# Patient Record
Sex: Male | Born: 1965
Health system: Southern US, Community
[De-identification: ages and names within clinical notes are randomized; demographics above are authoritative.]

## PROBLEM LIST (undated history)

## (undated) DIAGNOSIS — I639 Cerebral infarction, unspecified: Secondary | ICD-10-CM

## (undated) DIAGNOSIS — I509 Heart failure, unspecified: Secondary | ICD-10-CM

## (undated) DIAGNOSIS — T148XXA Other injury of unspecified body region, initial encounter: Secondary | ICD-10-CM

## (undated) DIAGNOSIS — E611 Iron deficiency: Secondary | ICD-10-CM

## (undated) DIAGNOSIS — F419 Anxiety disorder, unspecified: Secondary | ICD-10-CM

## (undated) DIAGNOSIS — G43909 Migraine, unspecified, not intractable, without status migrainosus: Secondary | ICD-10-CM

## (undated) DIAGNOSIS — T8859XA Other complications of anesthesia, initial encounter: Secondary | ICD-10-CM

## (undated) DIAGNOSIS — Z720 Tobacco use: Secondary | ICD-10-CM

## (undated) DIAGNOSIS — F329 Major depressive disorder, single episode, unspecified: Secondary | ICD-10-CM

## (undated) DIAGNOSIS — R7989 Other specified abnormal findings of blood chemistry: Secondary | ICD-10-CM

## (undated) DIAGNOSIS — M797 Fibromyalgia: Secondary | ICD-10-CM

## (undated) DIAGNOSIS — K219 Gastro-esophageal reflux disease without esophagitis: Secondary | ICD-10-CM

## (undated) DIAGNOSIS — E11319 Type 2 diabetes mellitus with unspecified diabetic retinopathy without macular edema: Secondary | ICD-10-CM

## (undated) DIAGNOSIS — Z87442 Personal history of urinary calculi: Secondary | ICD-10-CM

## (undated) DIAGNOSIS — T4145XA Adverse effect of unspecified anesthetic, initial encounter: Secondary | ICD-10-CM

## (undated) DIAGNOSIS — R918 Other nonspecific abnormal finding of lung field: Secondary | ICD-10-CM

## (undated) DIAGNOSIS — E119 Type 2 diabetes mellitus without complications: Secondary | ICD-10-CM

## (undated) DIAGNOSIS — I10 Essential (primary) hypertension: Secondary | ICD-10-CM

## (undated) DIAGNOSIS — G894 Chronic pain syndrome: Secondary | ICD-10-CM

## (undated) DIAGNOSIS — M542 Cervicalgia: Secondary | ICD-10-CM

## (undated) DIAGNOSIS — I214 Non-ST elevation (NSTEMI) myocardial infarction: Secondary | ICD-10-CM

## (undated) DIAGNOSIS — F32A Depression, unspecified: Secondary | ICD-10-CM

## (undated) DIAGNOSIS — Z8489 Family history of other specified conditions: Secondary | ICD-10-CM

## (undated) DIAGNOSIS — G8929 Other chronic pain: Secondary | ICD-10-CM

## (undated) DIAGNOSIS — E7800 Pure hypercholesterolemia, unspecified: Secondary | ICD-10-CM

## (undated) DIAGNOSIS — M1990 Unspecified osteoarthritis, unspecified site: Secondary | ICD-10-CM

## (undated) DIAGNOSIS — I2510 Atherosclerotic heart disease of native coronary artery without angina pectoris: Secondary | ICD-10-CM

## (undated) DIAGNOSIS — R9389 Abnormal findings on diagnostic imaging of other specified body structures: Secondary | ICD-10-CM

## (undated) DIAGNOSIS — E349 Endocrine disorder, unspecified: Secondary | ICD-10-CM

## (undated) DIAGNOSIS — K222 Esophageal obstruction: Secondary | ICD-10-CM

## (undated) HISTORY — DX: Endocrine disorder, unspecified: E34.9

## (undated) HISTORY — DX: Atherosclerotic heart disease of native coronary artery without angina pectoris: I25.10

## (undated) HISTORY — PX: UPPER GASTROINTESTINAL ENDOSCOPY: SHX188

## (undated) HISTORY — DX: Type 2 diabetes mellitus without complications: E11.9

## (undated) HISTORY — DX: Cerebral infarction, unspecified: I63.9

## (undated) HISTORY — PX: CERVICAL FUSION: SHX112

## (undated) HISTORY — PX: COLONOSCOPY: SHX174

## (undated) HISTORY — DX: Pure hypercholesterolemia, unspecified: E78.00

## (undated) HISTORY — DX: Other specified abnormal findings of blood chemistry: R79.89

## (undated) HISTORY — DX: Gastro-esophageal reflux disease without esophagitis: K21.9

## (undated) HISTORY — DX: Migraine, unspecified, not intractable, without status migrainosus: G43.909

## (undated) HISTORY — DX: Iron deficiency: E61.1

## (undated) HISTORY — PX: BREAST LUMPECTOMY: SHX2

## (undated) HISTORY — DX: Type 2 diabetes mellitus with unspecified diabetic retinopathy without macular edema: E11.319

## (undated) HISTORY — PX: BACK SURGERY: SHX140

## (undated) HISTORY — DX: Anxiety disorder, unspecified: F41.9

## (undated) HISTORY — DX: Heart failure, unspecified: I50.9

## (undated) HISTORY — PX: CYSTECTOMY: SUR359

## (undated) HISTORY — PX: FRACTURE SURGERY: SHX138

## (undated) HISTORY — DX: Essential (primary) hypertension: I10

## (undated) HISTORY — DX: Esophageal obstruction: K22.2

---

## 1997-09-27 ENCOUNTER — Other Ambulatory Visit: Admission: RE | Admit: 1997-09-27 | Discharge: 1997-09-27 | Payer: Self-pay | Admitting: *Deleted

## 1999-06-18 ENCOUNTER — Emergency Department (HOSPITAL_COMMUNITY): Admission: EM | Admit: 1999-06-18 | Discharge: 1999-06-18 | Payer: Self-pay | Admitting: *Deleted

## 1999-06-18 ENCOUNTER — Encounter: Payer: Self-pay | Admitting: *Deleted

## 1999-06-20 ENCOUNTER — Emergency Department (HOSPITAL_COMMUNITY): Admission: EM | Admit: 1999-06-20 | Discharge: 1999-06-20 | Payer: Self-pay | Admitting: Emergency Medicine

## 1999-07-13 ENCOUNTER — Inpatient Hospital Stay (HOSPITAL_COMMUNITY): Admission: RE | Admit: 1999-07-13 | Discharge: 1999-07-15 | Payer: Self-pay | Admitting: Neurosurgery

## 1999-07-13 ENCOUNTER — Encounter: Payer: Self-pay | Admitting: Neurosurgery

## 1999-08-06 ENCOUNTER — Encounter: Admission: RE | Admit: 1999-08-06 | Discharge: 1999-08-06 | Payer: Self-pay | Admitting: Neurosurgery

## 1999-08-06 ENCOUNTER — Encounter: Payer: Self-pay | Admitting: Neurosurgery

## 1999-10-25 ENCOUNTER — Encounter: Payer: Self-pay | Admitting: Neurosurgery

## 1999-10-25 ENCOUNTER — Encounter: Admission: RE | Admit: 1999-10-25 | Discharge: 1999-10-25 | Payer: Self-pay | Admitting: Neurosurgery

## 2000-02-27 ENCOUNTER — Encounter: Payer: Self-pay | Admitting: Neurosurgery

## 2000-02-27 ENCOUNTER — Encounter: Admission: RE | Admit: 2000-02-27 | Discharge: 2000-02-27 | Payer: Self-pay | Admitting: Neurosurgery

## 2002-04-10 ENCOUNTER — Emergency Department (HOSPITAL_COMMUNITY): Admission: EM | Admit: 2002-04-10 | Discharge: 2002-04-11 | Payer: Self-pay | Admitting: Emergency Medicine

## 2002-04-22 ENCOUNTER — Encounter: Admission: RE | Admit: 2002-04-22 | Discharge: 2002-05-19 | Payer: Self-pay | Admitting: Family Medicine

## 2002-05-26 ENCOUNTER — Encounter: Admission: RE | Admit: 2002-05-26 | Discharge: 2002-06-23 | Payer: Self-pay | Admitting: Family Medicine

## 2002-06-15 ENCOUNTER — Encounter: Payer: Self-pay | Admitting: Neurosurgery

## 2002-06-15 ENCOUNTER — Ambulatory Visit (HOSPITAL_COMMUNITY): Admission: RE | Admit: 2002-06-15 | Discharge: 2002-06-15 | Payer: Self-pay | Admitting: Neurosurgery

## 2002-07-01 ENCOUNTER — Encounter: Admission: RE | Admit: 2002-07-01 | Discharge: 2002-07-01 | Payer: Self-pay | Admitting: Neurosurgery

## 2002-07-01 ENCOUNTER — Encounter: Payer: Self-pay | Admitting: Neurosurgery

## 2002-07-15 ENCOUNTER — Ambulatory Visit (HOSPITAL_COMMUNITY): Admission: RE | Admit: 2002-07-15 | Discharge: 2002-07-15 | Payer: Self-pay | Admitting: Family Medicine

## 2002-07-15 ENCOUNTER — Encounter: Payer: Self-pay | Admitting: Family Medicine

## 2002-07-26 ENCOUNTER — Encounter: Payer: Self-pay | Admitting: Neurosurgery

## 2002-07-26 ENCOUNTER — Encounter: Admission: RE | Admit: 2002-07-26 | Discharge: 2002-07-26 | Payer: Self-pay | Admitting: Neurosurgery

## 2002-09-14 ENCOUNTER — Ambulatory Visit (HOSPITAL_COMMUNITY): Admission: RE | Admit: 2002-09-14 | Discharge: 2002-09-15 | Payer: Self-pay | Admitting: Neurosurgery

## 2002-09-14 ENCOUNTER — Encounter: Payer: Self-pay | Admitting: Neurosurgery

## 2003-01-21 ENCOUNTER — Ambulatory Visit (HOSPITAL_COMMUNITY): Admission: RE | Admit: 2003-01-21 | Discharge: 2003-01-21 | Payer: Self-pay | Admitting: Gastroenterology

## 2003-01-21 ENCOUNTER — Encounter: Payer: Self-pay | Admitting: Gastroenterology

## 2003-01-26 ENCOUNTER — Encounter: Payer: Self-pay | Admitting: Gastroenterology

## 2003-01-26 ENCOUNTER — Ambulatory Visit (HOSPITAL_COMMUNITY): Admission: RE | Admit: 2003-01-26 | Discharge: 2003-01-26 | Payer: Self-pay | Admitting: Gastroenterology

## 2003-04-05 ENCOUNTER — Encounter: Admission: RE | Admit: 2003-04-05 | Discharge: 2003-04-19 | Payer: Self-pay | Admitting: Family Medicine

## 2004-06-24 HISTORY — PX: ANKLE FUSION: SHX881

## 2004-07-11 ENCOUNTER — Emergency Department (HOSPITAL_COMMUNITY): Admission: EM | Admit: 2004-07-11 | Discharge: 2004-07-12 | Payer: Self-pay | Admitting: Emergency Medicine

## 2004-07-13 ENCOUNTER — Ambulatory Visit (HOSPITAL_COMMUNITY): Admission: RE | Admit: 2004-07-13 | Discharge: 2004-07-13 | Payer: Self-pay | Admitting: Neurosurgery

## 2004-07-31 ENCOUNTER — Encounter: Admission: RE | Admit: 2004-07-31 | Discharge: 2004-07-31 | Payer: Self-pay | Admitting: Neurosurgery

## 2004-08-14 ENCOUNTER — Encounter: Admission: RE | Admit: 2004-08-14 | Discharge: 2004-08-14 | Payer: Self-pay | Admitting: Neurosurgery

## 2005-02-11 ENCOUNTER — Encounter: Admission: RE | Admit: 2005-02-11 | Discharge: 2005-03-04 | Payer: Self-pay | Admitting: Neurology

## 2005-03-11 ENCOUNTER — Emergency Department (HOSPITAL_COMMUNITY): Admission: EM | Admit: 2005-03-11 | Discharge: 2005-03-11 | Payer: Self-pay | Admitting: Emergency Medicine

## 2005-05-07 ENCOUNTER — Encounter: Admission: RE | Admit: 2005-05-07 | Discharge: 2005-05-24 | Payer: Self-pay | Admitting: Orthopedic Surgery

## 2005-06-14 ENCOUNTER — Emergency Department (HOSPITAL_COMMUNITY): Admission: EM | Admit: 2005-06-14 | Discharge: 2005-06-14 | Payer: Self-pay | Admitting: Emergency Medicine

## 2005-10-30 ENCOUNTER — Ambulatory Visit: Payer: Self-pay | Admitting: Cardiology

## 2005-11-01 ENCOUNTER — Ambulatory Visit: Payer: Self-pay

## 2005-11-01 ENCOUNTER — Encounter: Payer: Self-pay | Admitting: Cardiovascular Disease

## 2005-11-08 ENCOUNTER — Ambulatory Visit: Payer: Self-pay | Admitting: Cardiology

## 2005-11-08 ENCOUNTER — Ambulatory Visit: Payer: Self-pay

## 2005-11-08 ENCOUNTER — Encounter: Payer: Self-pay | Admitting: Cardiology

## 2005-11-14 ENCOUNTER — Ambulatory Visit: Payer: Self-pay | Admitting: Cardiology

## 2005-11-21 ENCOUNTER — Ambulatory Visit: Payer: Self-pay | Admitting: Gastroenterology

## 2005-11-27 ENCOUNTER — Ambulatory Visit: Payer: Self-pay | Admitting: Gastroenterology

## 2006-06-12 ENCOUNTER — Emergency Department (HOSPITAL_COMMUNITY): Admission: EM | Admit: 2006-06-12 | Discharge: 2006-06-12 | Payer: Self-pay | Admitting: Emergency Medicine

## 2006-08-01 ENCOUNTER — Encounter: Admission: RE | Admit: 2006-08-01 | Discharge: 2006-08-01 | Payer: Self-pay | Admitting: Neurosurgery

## 2006-08-08 ENCOUNTER — Emergency Department (HOSPITAL_COMMUNITY): Admission: EM | Admit: 2006-08-08 | Discharge: 2006-08-09 | Payer: Self-pay | Admitting: Emergency Medicine

## 2006-08-14 ENCOUNTER — Inpatient Hospital Stay (HOSPITAL_COMMUNITY): Admission: RE | Admit: 2006-08-14 | Discharge: 2006-08-16 | Payer: Self-pay | Admitting: Neurosurgery

## 2006-10-17 ENCOUNTER — Emergency Department (HOSPITAL_COMMUNITY): Admission: EM | Admit: 2006-10-17 | Discharge: 2006-10-18 | Payer: Self-pay | Admitting: *Deleted

## 2007-01-05 ENCOUNTER — Ambulatory Visit: Payer: Self-pay | Admitting: Infectious Disease

## 2007-01-05 DIAGNOSIS — M1990 Unspecified osteoarthritis, unspecified site: Secondary | ICD-10-CM | POA: Insufficient documentation

## 2007-01-05 LAB — CONVERTED CEMR LAB
ALT: 48 units/L (ref 0–53)
AST: 23 units/L (ref 0–37)
Albumin: 5.2 g/dL (ref 3.5–5.2)
Alkaline Phosphatase: 108 units/L (ref 39–117)
BUN: 11 mg/dL (ref 6–23)
Basophils Absolute: 0 10*3/uL (ref 0.0–0.1)
Basophils Relative: 0 % (ref 0–1)
CMV IgM: <0.9 (ref <0.90)
CO2: 27 meq/L (ref 19–32)
Calcium: 10.4 mg/dL (ref 8.4–10.5)
Chloride: 105 meq/L (ref 96–112)
Creatinine, Ser: 1.16 mg/dL (ref 0.40–1.50)
Cytomegalovirus Ab-IgG: UNDETERMINED
EBV NA IgG: 6.87 — ABNORMAL HIGH
EBV VCA IgG: 4.24 — ABNORMAL HIGH
EBV VCA IgM: 0.59
Eosinophils Absolute: 0.2 10*3/uL (ref 0.0–0.7)
Eosinophils Relative: 3 % (ref 0–5)
Glucose, Bld: 95 mg/dL (ref 70–99)
HCT: 46.6 % (ref 39.0–52.0)
HCV Ab: NEGATIVE
Hemoglobin: 15.6 g/dL (ref 13.0–17.0)
Lymphocytes Relative: 28 % (ref 12–46)
Lymphs Abs: 1.4 10*3/uL (ref 0.7–3.3)
MCHC: 33.5 g/dL (ref 30.0–36.0)
MCV: 92.1 fL (ref 78.0–100.0)
Monocytes Absolute: 0.4 10*3/uL (ref 0.2–0.7)
Monocytes Relative: 8 % (ref 3–11)
Neutro Abs: 3 10*3/uL (ref 1.7–7.7)
Neutrophils Relative %: 60 % (ref 43–77)
Platelets: 162 10*3/uL (ref 150–400)
Potassium: 4.7 meq/L (ref 3.5–5.3)
RBC: 5.06 M/uL (ref 4.22–5.81)
RDW: 14.4 % — ABNORMAL HIGH (ref 11.5–14.0)
RPR Ser Ql: REACTIVE — AB
RPR Titer: 1:8 {titer}
Rubeola IgG: 1.11 — ABNORMAL HIGH
Sodium: 143 meq/L (ref 135–145)
Total Bilirubin: 0.7 mg/dL (ref 0.3–1.2)
Total Protein: 7.8 g/dL (ref 6.0–8.3)
WBC: 5 10*3/uL (ref 4.0–10.5)

## 2007-01-12 ENCOUNTER — Telehealth: Payer: Self-pay | Admitting: Infectious Disease

## 2007-01-12 DIAGNOSIS — R768 Other specified abnormal immunological findings in serum: Secondary | ICD-10-CM | POA: Insufficient documentation

## 2007-01-26 ENCOUNTER — Ambulatory Visit: Payer: Self-pay | Admitting: Infectious Disease

## 2007-01-26 LAB — CONVERTED CEMR LAB

## 2007-06-07 ENCOUNTER — Emergency Department (HOSPITAL_COMMUNITY): Admission: EM | Admit: 2007-06-07 | Discharge: 2007-06-07 | Payer: Self-pay | Admitting: Emergency Medicine

## 2007-08-05 ENCOUNTER — Emergency Department (HOSPITAL_COMMUNITY): Admission: EM | Admit: 2007-08-05 | Discharge: 2007-08-05 | Payer: Self-pay | Admitting: Emergency Medicine

## 2007-11-02 ENCOUNTER — Emergency Department (HOSPITAL_COMMUNITY): Admission: EM | Admit: 2007-11-02 | Discharge: 2007-11-02 | Payer: Self-pay | Admitting: Emergency Medicine

## 2008-02-23 ENCOUNTER — Emergency Department (HOSPITAL_COMMUNITY): Admission: EM | Admit: 2008-02-23 | Discharge: 2008-02-23 | Payer: Self-pay | Admitting: Emergency Medicine

## 2008-04-18 ENCOUNTER — Encounter: Admission: RE | Admit: 2008-04-18 | Discharge: 2008-04-18 | Payer: Self-pay | Admitting: Neurosurgery

## 2008-10-24 ENCOUNTER — Encounter: Admission: RE | Admit: 2008-10-24 | Discharge: 2008-10-24 | Payer: Self-pay | Admitting: Neurology

## 2009-04-07 ENCOUNTER — Ambulatory Visit (HOSPITAL_BASED_OUTPATIENT_CLINIC_OR_DEPARTMENT_OTHER): Admission: RE | Admit: 2009-04-07 | Discharge: 2009-04-07 | Payer: Self-pay | Admitting: Family Medicine

## 2009-04-08 ENCOUNTER — Ambulatory Visit: Payer: Self-pay | Admitting: Internal Medicine

## 2009-04-26 ENCOUNTER — Ambulatory Visit (HOSPITAL_COMMUNITY): Admission: RE | Admit: 2009-04-26 | Discharge: 2009-04-27 | Payer: Self-pay | Admitting: Neurosurgery

## 2009-05-10 ENCOUNTER — Observation Stay (HOSPITAL_COMMUNITY): Admission: AD | Admit: 2009-05-10 | Discharge: 2009-05-12 | Payer: Self-pay | Admitting: Neurosurgery

## 2009-08-18 ENCOUNTER — Observation Stay (HOSPITAL_COMMUNITY): Admission: EM | Admit: 2009-08-18 | Discharge: 2009-08-19 | Payer: Self-pay | Admitting: Emergency Medicine

## 2009-08-19 ENCOUNTER — Encounter: Payer: Self-pay | Admitting: Cardiology

## 2009-08-19 ENCOUNTER — Encounter (INDEPENDENT_AMBULATORY_CARE_PROVIDER_SITE_OTHER): Payer: Self-pay | Admitting: Internal Medicine

## 2009-08-24 ENCOUNTER — Ambulatory Visit: Payer: Self-pay | Admitting: Cardiology

## 2009-08-24 DIAGNOSIS — R0602 Shortness of breath: Secondary | ICD-10-CM | POA: Insufficient documentation

## 2009-08-24 DIAGNOSIS — R079 Chest pain, unspecified: Secondary | ICD-10-CM | POA: Insufficient documentation

## 2009-08-25 ENCOUNTER — Ambulatory Visit: Payer: Self-pay | Admitting: Internal Medicine

## 2009-08-30 ENCOUNTER — Encounter: Payer: Self-pay | Admitting: Cardiology

## 2009-09-07 ENCOUNTER — Telehealth: Payer: Self-pay | Admitting: Cardiology

## 2009-09-07 LAB — CONVERTED CEMR LAB
Pro B Natriuretic peptide (BNP): 14 pg/mL (ref 0.0–100.0)
Sed Rate: 11 mm/hr (ref 0–22)

## 2009-09-08 ENCOUNTER — Ambulatory Visit: Payer: Self-pay

## 2009-09-08 ENCOUNTER — Encounter: Payer: Self-pay | Admitting: Cardiology

## 2009-09-08 ENCOUNTER — Ambulatory Visit (HOSPITAL_COMMUNITY): Admission: RE | Admit: 2009-09-08 | Discharge: 2009-09-08 | Payer: Self-pay | Admitting: Cardiology

## 2009-09-08 ENCOUNTER — Ambulatory Visit: Payer: Self-pay | Admitting: Cardiology

## 2009-10-05 ENCOUNTER — Encounter: Admission: RE | Admit: 2009-10-05 | Discharge: 2009-10-05 | Payer: Self-pay | Admitting: Neurosurgery

## 2009-10-25 ENCOUNTER — Ambulatory Visit (HOSPITAL_COMMUNITY): Admission: RE | Admit: 2009-10-25 | Discharge: 2009-10-26 | Payer: Self-pay | Admitting: Neurosurgery

## 2010-04-17 ENCOUNTER — Encounter: Admission: RE | Admit: 2010-04-17 | Discharge: 2010-04-17 | Payer: Self-pay | Admitting: Neurology

## 2010-07-15 ENCOUNTER — Encounter: Payer: Self-pay | Admitting: Neurosurgery

## 2010-07-24 NOTE — Progress Notes (Signed)
Summary: PFT RESULTS   Phone Note Call from Patient Call back at Home Phone [phone]   Caller: Patient Reason for Call: Talk to Nurse, Lab or Test Results Summary of Call: REQUEST RESULTS OF PFT Initial call taken by: Migdalia Dk,  September 07, 2009 11:01 AM  Follow-up for Phone Call        talked with pt-pt will discuss PFT results further with Dr Shirlee Latch at time of appt 09-08-09 -pt states inhaler has helped some

## 2010-07-24 NOTE — Miscellaneous (Signed)
Summary: Orders Update  Clinical Lists Changes  Problems: Added new problem of ABFND, FALSE POSITIVE SEROLOGIC TEST, SYPH (ICD-795.6) Orders: Added new Test order of T- * Misc. Laboratory test [phone]) - Signed

## 2010-07-24 NOTE — Assessment & Plan Note (Signed)
Summary: ec6/pericarditis/eval for stress test & echo/seen in ed over ...  Medications Added DILAUDID 2 MG TABS (HYDROMORPHONE HCL) take 2 to 3 tablets as needed LEXAPRO 20 MG TABS (ESCITALOPRAM OXALATE) take 1 1/2 tablet once daily BACLOFEN 10 MG TABS (BACLOFEN) take one tablet as needed TANDEM 162-115.2 MG CAPS (FERROUS FUM-IRON POLYSACCH) once daily ASPIRIN 81 MG TABS (ASPIRIN) once daily COMBIVENT 18-103 MCG/ACT AERO (IPRATROPIUM-ALBUTEROL) please use 4 times per day      Allergies Added:   Primary Provider:  Rudi Heap  CC:  new patient/evaluation for stress test and echo.  History of Present Illness: 45 yo with history of GERD and smoking presents for evaluation of chest pain and exertional dyspnea.  Patient has had exertional shortness of breath since 10/10.  He is out of breath after climbing a flight of steps or with trying to jog.  Moderate exertion at work tires him easily.  He does continue to smoke about 1 ppd.  No wheezing that he has noted.  For the last month, patient has been getting episodes of chest tightness, moderate in severity.  Nothing in particular brings on the pain though it may come more often with exertion than at rest.  It can last 5 min - 2 hours.  It occurs on and off throughout the day.  It seems to be worse with deep breathing or lying on his right side.  The pain got quite severe last Friday so patient went to the ER.  CXR was clear. He was admitted to rule out MI.  Cardiac enzymes were negative and ECG unremarkable.  He was thought to potentially have acute pericarditis and was treated with Ibuprofen with some but not complete relief.    ECG: NSR, normal  Labs (2/11): TSH normal, LDL 100, HDL 30, creatinine 1.18, cardiac enzymes negative  Current Medications (verified): 1)  Alprazolam 1 Mg  Tabs (Alprazolam) .... Take 1 Tablet By Mouth Four Times A Day As Needed Anxiety 2)  Dilaudid 2 Mg Tabs (Hydromorphone Hcl) .... Take 2 To 3 Tablets As Needed 3)   Protonix 20 Mg  Tbec (Pantoprazole Sodium) .... Take 1 Tablet By Mouth Two Times A Day 4)  Lexapro 20 Mg Tabs (Escitalopram Oxalate) .... Take 1 1/2 Tablet Once Daily 5)  Baclofen 10 Mg Tabs (Baclofen) .... Take One Tablet As Needed 6)  Tandem 162-115.2 Mg Caps (Ferrous Fum-Iron Polysacch) .... Once Daily 7)  Aspirin 81 Mg Tabs (Aspirin) .... Once Daily  Allergies (verified): 1)  ! Sulfa 2)  ! Midodrine Hcl  Past History:  Past Medical History: 1. Headache 2. Osteoarthritis  3. Depression 4. Chronic cervical spine disease, status post multiple surgeries with chronic pain 5.  Insomnia, late onset of sleep, and he sleeps until late in the morning 6. Tobacco abuse: Smokes 1 ppd.  7. GERD 8. ETT-myoview (5/07): No ischemia or infarction  Family History: Family History of Arthritis Family History of Endometrial cancer Grandfather with "leaky heart valve" and CHF Uncle with CABG at 57  Social History: Occupation: Insurance claims handler Married, monogamous, no hx of STDs Current Smoker 21years of 1.5ppd Alcohol use-yes Drug use-no remote marijuana, cocaine Travelled to [place] 7 years ago.  Hunter. Skins animals.  One outside dog.  Review of Systems       All systems reviewed and negative except as per HPI.   Vital Signs:  Patient profile:   45 year old male Height:      69 inches Weight:  188 pounds BMI:     27.86 Pulse rate:   74 / minute Pulse rhythm:   regular BP sitting:   105 / 71  (left arm) Cuff size:   large  Vitals Entered By: Judithe Modest CMA (August 24, 2009 8:49 AM)   Impression & Recommendations:  Problem # 1:  CHEST PAIN-UNSPECIFIED (ICD-786.50) Patient has atypical chest pain that has been present for a month.  There does seem to be a pleuritic and positional component to it, does not seem to be exertional.  ECG is normal (no evidence for pericarditis).  Ibuprofen has helped some.  Acute pericarditis is a possibility.  Would also consider asthma with  chest tightness though the chest pain is not really associated with his exertional shortness of breath.  Doubt coronary ischemia given minimal risk factors (smoking only).   - Echo to assess for pericardial effusion.  - ESR - Continue course of Ibuprofen (also taking Protonix) - ETT to assess for ischemia.   Problem # 2:  DYSPNEA (ICD-786.05) Patient has been short of breath with exertion for several months.  ? cause.  He is a smoker and also works in a Insurance claims handler.  Would consider COPD and asthma as possible causes.  Will be getting echo to assess LV function.  Will check BNP.  Also should get full PFTs.  I will try him on a Combivent inhaler to see if it helps any.   Problem # 3:  SMOKER I strongly encouraged him to stop smoking.  He should use nicotine patches.  Will avoid Chantix given depression history.   Other Orders: TLB-BNP (B-Natriuretic Peptide) (83880-BNPR) TLB-Sedimentation Rate (ESR) (85652-ESR) Pulmonary Function Test (PFT) Echocardiogram (Echo) Treadmill (Treadmill)  Patient Instructions: 1)  Your physician recommends that you schedule a follow-up appointment in: 2 weeks-to have echo AND gxt SAME DAY 2)  Your physician has recommended you make the following change in your medication: PLEASE START USING INHALER 4X PER DAY AND GET NON-SMOKING PATCHES OVER THE COUNTER--START WITH 21 MG PATCHES 3)  Your physician has requested that you have an echocardiogram.  Echocardiography is a painless test that uses sound waves to create images of your heart. It provides your doctor with information about the size and shape of your heart and how well your heart's chambers and valves are working.  This procedure takes approximately one hour. There are no restrictions for this procedure. 4)  Your physician has requested that you have an exercise tolerance test.  For further information please visit [email or web address]  Please also follow instruction sheet, as given. 5)  Your physician has  recommended that you have a pulmonary function test.  Pulmonary Function Tests are a group of tests that measure how well air moves in and out of your lungs. Prescriptions: COMBIVENT 18-103 MCG/ACT AERO (IPRATROPIUM-ALBUTEROL) please use 4 times per day  #1 x 6   Entered by:   Ledon Snare, RN   Authorized by:   Marca Ancona, MD   Signed by:   Ledon Snare, RN on 08/24/2009   Method used:   Electronically to        [facility] [phone]* (retail)       [address]       [place], [place]  [address]       Ph: [phone] or [phone]       Fax: [phone]   RxID:   [id number]   Appended Document:  ec6/pericarditis/eval for stress test & echo/seen in ed over ... normal BNP and ESR

## 2010-07-24 NOTE — Assessment & Plan Note (Signed)
Summary: new + west nile elevated ASO +Rubedo   PCP:  LONG, Scott  Chief Complaint:  New pt referral    pos west nile   nausea, fatique, no appetite, and muscle pain.  History of Present Illness:  45 year old Caucasian male with history of cervical disk disease, s.p multiple surgeries, chronic headaches preents. Towards the end of May noted malaise, lack of energy, decreased appetite, nausea, loose stools with food.  fatigue, weight los occasional dizziness, but no syncope, bu no fevers, missing work presented to Dr. Zenda Alpers for evaluation. Of note his son had malaise subjective fevers and remained at home for 3 days around this same time.  Per the patient Dr. Zenda Alpers prescribed him bactrim ds by mouth one tablet two times a day,  He continued to feel worse, with temparature to 103.7  where he was seen by Dr. Christell Constant and was found to have what sounds like macular rash on left trunk, on bilateral shins. Labs were sent. Redness progressed across, chest, back, face, within 36 hours with fevers, drenching sweats and painful cervical adenopathy..Rash was nonrpuritic but irritating. Septra was stopped. Patient was rx benadry and started on doxycycline to cover for RMSR, ehrlichia. . Rash failed to improve and he stopped the doxcycyline. He was called back on Saturday  on June 14th. Rubeola IgG and IgM were added tand Per report the titers came back "borderline," and he was "quarantined" by Dr. Bevelyn Buckles. I do not yet have these titers, HOwever followup titers on 6/19 were .95 for IgM (borderline), IgG postive at 1.34. Health dept and doubted Measles, and suggested Chad nile serologiest  West Nile IgG but not IgM  was positive initially by EIA but negative on repeat test in July. ASO titers were checked and elevated above 300. Remaining labs are summarized below but include RMSF of 1:64,  Ehrlichia test negative, and repeat Lyme test negative. Remainder of ARboviruses panel were negative. Monospot negative. Patient has been feeling better over the past several weeks. HIs energy is returning, and is working half days. HIs fevers are gone. Still having sweating at night with sheets now damp.    Current Allergies (reviewed today): ! SULFA  Past Medical History:    Headache    Osteoarthritis  Past Surgical History:    Cervical surgery removal of bone spurs    Cervical fusion 5, 6,7     Cervical fusion 2, 3     Teeth extraction in 2007, with implants    Ankle fracture s.p surgery   Family History:    Family History of Arthritis    Family History of Endometrial cancer  Social History:    Occupation: Insurance claims handler    Married, monogamous, no hx of STDs    Current Smoker 21years of 1.5ppd    Alcohol use-yes    Drug use-no    remote marijuana, cocaine    Travelled to [place] 7 years ago.     Has travelled to [facility] last year, [place] 2 years, [place]    Hunter white tail deer. Last hunted in December. Skins animals.     One dog outside dog.   Risk Factors:  Tobacco use:  current    Cigarettes:  Yes -- 1.5 pack(s) per day    Counseled to quit/cut down tobacco use:  yes Drug use:  no Alcohol use:  yes    Type:  beer    Drinks per day:  <1    Has patient --  Felt need to cut down:  no       Been annoyed by complaints:  no       Felt guilty about drinking:  no       Needed eye opener in the morning:  no    Counseled to quit/cut down alcohol use:  no Exercise:  no  Family History Risk Factors:    Family History of MI in females < 57 years old:  no    Family History of MI in males < 73 years old:  no   Review of Systems      See HPI  General      Complains of fatigue and malaise.  Eyes      Denies blurring and eye irritation.  ENT      Denies difficulty swallowing.  CV      Denies chest pain or discomfort and difficulty breathing at night.  Resp      Complains of shortness of breath.      with exertion  GI      Complains of diarrhea.      Denies abdominal pain, bloody stools, and constipation.  GU      Denies dysuria.  MS      Complains of loss of strength and muscle weakness.       Denies joint pain, joint redness, and joint swelling.  Derm      Complains of rash.      see HPI  Neuro      Complains of numbness.      across forehead with headaches (chronic)  Psych      Denies anxiety and depression.  ENT      Denies difficulty swallowing.   Vital Signs:  Patient Profile:   45 Years Old Male Weight:      151.5 pounds Temp:     97.3 degrees F oral Pulse rate:   68 / minute BP sitting:   113 / 72  (right arm)  Pt. in pain?   yes    Location:   neck  Vitals Entered By: Tomasita Morrow RN (January 05, 2007 12:20 PM)              Is Patient Diabetic? No  Does patient need assistance? Functional Status Self care Ambulation Normal   Physical Exam  General:     alert.   Head:     normocephalic and atraumatic.   Eyes:     vision grossly intact, pupils equal, pupils round, and pupils reactive to light.   Ears:     no external deformities.   Nose:     no external deformity and no nasal discharge.   Mouth:     good dentition, no erythema, no exudates, no posterior lymphoid hypertrophy, no postnasal drip, no aphthous ulcers, and fair dentition.   Neck:     supple and no masses.   Chest Wall:     no deformities.   Lungs:     normal respiratory effort, normal breath sounds, no dullness, no fremitus, no crackles, and no wheezes.   Heart:     normal rate, regular rhythm, no murmur, no gallop, and no rub.   Abdomen:     soft, non-tender, normal bowel sounds, no distention, no masses, and no guarding.   Msk:     normal ROM.   Pulses:     R radial normal.   Extremities:     No clubbing, cyanosis, edema, or deformity noted with normal  full range of motion of all joints.   Neurologic:     alert & oriented X3, cranial nerves II-XII intact, strength normal in all extremities, sensation intact to light touch, and gait normal.   Skin:     various small hemangiomas smaller than mm in size on lower abdomen and back, no other obvious rash at present  Cervical Nodes:     No lymphadenopathy noted Axillary Nodes:     No palpable lymphadenopathy Inguinal Nodes:     No significant adenopathy Psych:     Oriented X3 and memory intact for recent and remote.   Additional Exam:     Labs reviewed from [facility] clinic: 12/30/06: wbc, hgb, hct normal, platelets 138, CMET ast 69, alt 149, alk phoph 142, ASO 322.9  12/17/06: EEE, WEE, [place] Encephalitis, [place] Encephalitis, IgG and IgM by IFA negative <1:16 WEst Nile IgG IFA <1:16, West Nile IgM by EIA negaive, [place] Encephalitis by EIA nonspecific result Rickettsia Ricketsii IgG IFA 1: 64, typhi 64, Ehrlichia IgG IFA <1:64  12/11/06: cmet ast 44, alt 88 ggt 151, cbc normal HIV negative West Nile Virus Antibody IgG positive (by EIA?) West Nile virus antibody IgM negative  Rubeola IgM 0.95 (borderline) Rubeola IgG EIA 1.34 ( positive) ASO 353      Impression & Recommendations:  Problem # 1:  FUO (ICD-780.6) Assessment: Deteriorated Of major concern to patient, provider was possibility of atypical measles in an adult. While this patient most likely received liveattenuated vaccine which had less reports of later adult measles, atypical measles than the killed vaccine, development of measles was certainly possible I do not have all of his titers, but a fourfold rise in titers would be diagnostic and I do not see evidence for this. He has no serological evidence for recent WNV, and the one EIA may be a false positive. His high ASO certainly suggests recent strep infection but he has no evidence for Rheumatic fever or Glomerulonephritis. Other possible causes for his symptom constellation in May June would include EBV, (monospot negative) CMV, HIV (but he is negative bye ELISA), syphillis, Parvovirus.  I am encouraged by his improvment and counselled the patient that he should continue to do so.  In interm in interest of helping him know what he may have had I am rechecking rubeola titers, rmsf titers, cbc, cmet I am checking RPR and hep C, B, A serologies  Orders: Consultation Level IV (38756) T-Comprehensive Metabolic Panel ([id number]) T-CBC w/Diff ([id number]) T- * Misc. Laboratory test [phone]) T-Hepatitis C Antibody [phone]) T-RPR (Syphilis) [phone]) T-CMV IgG Antibody [phone]) T-CMV IgM  Antibody ([id number]) T-Epstein Barr Virus Antibody Panel I [phone]) T- * Misc. Laboratory test [id number])   Medications Added to Medication List This Visit: 1)  Alprazolam 1 Mg Tabs (Alprazolam) .... Take 1 tablet by mouth four times a day as needed anxiety 2)  Vicodin 5-500 Mg Tabs (Hydrocodone-acetaminophen) .... Take 1 tablet by mouth four times a day as needed pain 3)  Protonix 20 Mg Tbec (Pantoprazole sodium) .... Take 1 tablet by mouth two times a day 4)  Prozac 20 Mg Caps (Fluoxetine hcl) .... Once daily   Patient Instructions: 1)  Discussed the hazards of tobacco smoking (use). Smoking cessation recommended and techniques and options to help patient quit were discussed. 2)  Please schedule a follow-up appointment in 1 month.   ]  CC: DR. Caren Macadam MD, Lindaann Pascal PA

## 2010-07-24 NOTE — Progress Notes (Signed)
Summary: wanting lab results/cvd/dde  Phone Note Call from Patient Call back at cell [phone]   Caller: Patient Reason for Call: Talk to Doctor, Lab or Test Results Action Taken: Provider Notified Details for Reason: Message left to call him about his lab results from 01/05/07. Details of Action Taken: Digital page. Initial call taken by: Jennet Maduro RN,  January 12, 2007 2:43 PM  Follow-up for Phone Call        I spoke with patients wife about labs and the need to come for repeat lab. At this time there are no changes to report/tkk Follow-up by: Tomasita Morrow RN,  January 15, 2007 2:30 PM

## 2010-08-10 ENCOUNTER — Other Ambulatory Visit: Payer: Self-pay | Admitting: Neurosurgery

## 2010-08-10 DIAGNOSIS — M542 Cervicalgia: Secondary | ICD-10-CM

## 2010-08-13 ENCOUNTER — Ambulatory Visit
Admission: RE | Admit: 2010-08-13 | Discharge: 2010-08-13 | Disposition: A | Discharge status: Home / Self Care | Payer: BC Managed Care – PPO | Source: Ambulatory Visit | Attending: Neurosurgery | Admitting: Neurosurgery

## 2010-08-13 DIAGNOSIS — M542 Cervicalgia: Secondary | ICD-10-CM

## 2010-09-11 LAB — CBC
HCT: 45.1 % (ref 39.0–52.0)
Hemoglobin: 15.8 g/dL (ref 13.0–17.0)
MCHC: 35.1 g/dL (ref 30.0–36.0)
MCV: 92 fL (ref 78.0–100.0)
Platelets: 132 10*3/uL — ABNORMAL LOW (ref 150–400)
RBC: 4.9 MIL/uL (ref 4.22–5.81)
RDW: 13.3 % (ref 11.5–15.5)
WBC: 7.6 10*3/uL (ref 4.0–10.5)

## 2010-09-11 LAB — BASIC METABOLIC PANEL
BUN: 10 mg/dL (ref 6–23)
CO2: 29 mEq/L (ref 19–32)
Calcium: 9.6 mg/dL (ref 8.4–10.5)
Chloride: 103 mEq/L (ref 96–112)
Creatinine, Ser: 1.16 mg/dL (ref 0.4–1.5)
GFR calc Af Amer: >60 mL/min (ref >60)
GFR calc non Af Amer: >60 mL/min (ref >60)
Glucose, Bld: 78 mg/dL (ref 70–99)
Potassium: 4 mEq/L (ref 3.5–5.1)
Sodium: 138 mEq/L (ref 135–145)

## 2010-09-11 LAB — SURGICAL PCR SCREEN
MRSA, PCR: NEGATIVE
Staphylococcus aureus: NEGATIVE

## 2010-09-14 LAB — POCT CARDIAC MARKERS
CKMB, poc: <1 ng/mL — ABNORMAL LOW (ref 1.0–8.0)
Myoglobin, poc: 115 ng/mL (ref 12–200)
Troponin i, poc: <0.05 ng/mL (ref 0.00–0.09)

## 2010-09-14 LAB — DIFFERENTIAL
Basophils Absolute: 0 10*3/uL (ref 0.0–0.1)
Basophils Relative: 0 % (ref 0–1)
Eosinophils Absolute: 0.1 10*3/uL (ref 0.0–0.7)
Eosinophils Relative: 1 % (ref 0–5)
Lymphocytes Relative: 14 % (ref 12–46)
Lymphs Abs: 1.2 10*3/uL (ref 0.7–4.0)
Monocytes Absolute: 0.4 10*3/uL (ref 0.1–1.0)
Monocytes Relative: 5 % (ref 3–12)
Neutro Abs: 6.4 10*3/uL (ref 1.7–7.7)
Neutrophils Relative %: 80 % — ABNORMAL HIGH (ref 43–77)

## 2010-09-14 LAB — BASIC METABOLIC PANEL
BUN: 8 mg/dL (ref 6–23)
CO2: 31 mEq/L (ref 19–32)
Calcium: 9.6 mg/dL (ref 8.4–10.5)
Chloride: 104 mEq/L (ref 96–112)
Creatinine, Ser: 1.18 mg/dL (ref 0.4–1.5)
GFR calc Af Amer: >60 mL/min (ref >60)
GFR calc non Af Amer: >60 mL/min (ref >60)
Glucose, Bld: 98 mg/dL (ref 70–99)
Potassium: 4.2 mEq/L (ref 3.5–5.1)
Sodium: 141 mEq/L (ref 135–145)

## 2010-09-14 LAB — TSH: TSH: 1.074 u[IU]/mL (ref 0.350–4.500)

## 2010-09-14 LAB — CBC
HCT: 44.2 % (ref 39.0–52.0)
Hemoglobin: 15.2 g/dL (ref 13.0–17.0)
MCHC: 34.4 g/dL (ref 30.0–36.0)
MCV: 92.1 fL (ref 78.0–100.0)
Platelets: 145 10*3/uL — ABNORMAL LOW (ref 150–400)
RBC: 4.79 MIL/uL (ref 4.22–5.81)
RDW: 14.4 % (ref 11.5–15.5)
WBC: 8 10*3/uL (ref 4.0–10.5)

## 2010-09-14 LAB — CARDIAC PANEL(CRET KIN+CKTOT+MB+TROPI)
CK, MB: 0.8 ng/mL (ref 0.3–4.0)
CK, MB: 0.9 ng/mL (ref 0.3–4.0)
Relative Index: INVALID (ref 0.0–2.5)
Relative Index: INVALID (ref 0.0–2.5)
Total CK: 72 U/L (ref 7–232)
Total CK: 74 U/L (ref 7–232)
Troponin I: 0.01 ng/mL (ref 0.00–0.06)
Troponin I: 0.02 ng/mL (ref 0.00–0.06)

## 2010-09-14 LAB — CK TOTAL AND CKMB (NOT AT ARMC)
CK, MB: 1 ng/mL (ref 0.3–4.0)
Relative Index: INVALID (ref 0.0–2.5)
Total CK: 74 U/L (ref 7–232)

## 2010-09-14 LAB — TROPONIN I: Troponin I: 0.02 ng/mL (ref 0.00–0.06)

## 2010-09-14 LAB — LIPID PANEL
Cholesterol: 153 mg/dL (ref 0–200)
HDL: 30 mg/dL — ABNORMAL LOW (ref >39)
LDL Cholesterol: 100 mg/dL — ABNORMAL HIGH (ref 0–99)
Total CHOL/HDL Ratio: 5.1 RATIO
Triglycerides: 117 mg/dL (ref <150)
VLDL: 23 mg/dL (ref 0–40)

## 2010-09-14 LAB — D-DIMER, QUANTITATIVE (NOT AT ARMC): D-Dimer, Quant: 0.27 ug/mL-FEU (ref 0.00–0.48)

## 2010-09-14 LAB — HOMOCYSTEINE: Homocysteine: 11.3 umol/L (ref 4.0–15.4)

## 2010-09-14 LAB — HEMOGLOBIN A1C
Hgb A1c MFr Bld: 5.1 % (ref 4.6–6.1)
Mean Plasma Glucose: 100 mg/dL

## 2010-09-26 LAB — BASIC METABOLIC PANEL
BUN: 7 mg/dL (ref 6–23)
CO2: 29 mEq/L (ref 19–32)
Calcium: 9.3 mg/dL (ref 8.4–10.5)
Chloride: 101 mEq/L (ref 96–112)
Creatinine, Ser: 1.14 mg/dL (ref 0.4–1.5)
GFR calc Af Amer: >60 mL/min (ref >60)
GFR calc non Af Amer: >60 mL/min (ref >60)
Glucose, Bld: 86 mg/dL (ref 70–99)
Potassium: 4.1 mEq/L (ref 3.5–5.1)
Sodium: 134 mEq/L — ABNORMAL LOW (ref 135–145)

## 2010-09-26 LAB — SEDIMENTATION RATE: Sed Rate: 19 mm/hr — ABNORMAL HIGH (ref 0–16)

## 2010-09-26 LAB — DIFFERENTIAL
Basophils Absolute: 0 10*3/uL (ref 0.0–0.1)
Basophils Relative: 0 % (ref 0–1)
Eosinophils Absolute: 0.1 10*3/uL (ref 0.0–0.7)
Eosinophils Relative: 2 % (ref 0–5)
Lymphocytes Relative: 24 % (ref 12–46)
Lymphs Abs: 1.6 10*3/uL (ref 0.7–4.0)
Monocytes Absolute: 0.5 10*3/uL (ref 0.1–1.0)
Monocytes Relative: 7 % (ref 3–12)
Neutro Abs: 4.4 10*3/uL (ref 1.7–7.7)
Neutrophils Relative %: 67 % (ref 43–77)

## 2010-09-26 LAB — CBC
HCT: 47.8 % (ref 39.0–52.0)
Hemoglobin: 16.8 g/dL (ref 13.0–17.0)
MCHC: 35 g/dL (ref 30.0–36.0)
MCV: 89.9 fL (ref 78.0–100.0)
Platelets: 196 10*3/uL (ref 150–400)
RBC: 5.32 MIL/uL (ref 4.22–5.81)
RDW: 13.3 % (ref 11.5–15.5)
WBC: 6.6 10*3/uL (ref 4.0–10.5)

## 2010-09-26 LAB — GRAM STAIN

## 2010-09-26 LAB — CULTURE, BLOOD (ROUTINE X 2)
Culture: NO GROWTH
Culture: NO GROWTH

## 2010-09-26 LAB — ANAEROBIC CULTURE

## 2010-09-26 LAB — WOUND CULTURE: Culture: NO GROWTH

## 2010-09-27 LAB — CBC
HCT: 46.7 % (ref 39.0–52.0)
Hemoglobin: 16.1 g/dL (ref 13.0–17.0)
MCHC: 34.5 g/dL (ref 30.0–36.0)
MCV: 91.6 fL (ref 78.0–100.0)
Platelets: 150 10*3/uL (ref 150–400)
RBC: 5.1 MIL/uL (ref 4.22–5.81)
RDW: 13.8 % (ref 11.5–15.5)
WBC: 5.3 10*3/uL (ref 4.0–10.5)

## 2010-11-09 NOTE — H&P (Signed)
NAME:  Richard Franco, Richard Franco NO.:  [id number]   MEDICAL RECORD NO.:  [id number]          PATIENT TYPE:  INP   LOCATION:  3007                         FACILITY:  [facility]   PHYSICIAN:  Hilda Lias, M.D.   DATE OF BIRTH:  1966-03-05   DATE OF ADMISSION:  08/14/2006  DATE OF DISCHARGE:                              HISTORY & PHYSICAL   HISTORY OF PRESENT ILLNESS:  Richard Franco is a gentleman who underwent  anterior decompression at C3-4 and, later on, posterior decompression at  C5-6.  The last surgery was done 4 years ago.  I had been following him  in my office and lately he has been complaining of more pain, mostly  neck pain, that was associated with weakness of the upper extremity.  The patient had been going quite frequently to the emergency room  because of increased pain.  Because of that and because he had failed  conservative treatment, we proceeded with a myelogram which showed that,  indeed, he had quite a bit of spondylosis at the C5-6 and C6-7.  In the  lower back, he has hypertrophy in the L5-S1 to the right.  The patient  wanted to proceed to surgery because he is not any better.   PAST MEDICAL HISTORY:  Anterior diskectomy at C3-4, polyp removal from  the colon in 1999, posterior decompression of C5-6.   ALLERGIES:  THE PATIENT IS ALLERGIC TO MIDRIN.   FAMILY HISTORY:  Unremarkable.   SOCIAL HISTORY:  He does not drink but he smokes.   REVIEW OF SYSTEMS:  Positive for neck pain, upper extremity pain.   PHYSICAL EXAMINATION:  HEENT:  The head, nose, and throat are normal.  NECK:  He has scars, one anterior and one posterior.  He has a decreased  flexibility of the cervical spine with pain associated with it.  LUNGS:  Clear.  HEART:  Heart sounds normal.  EXTREMITIES:  There were normal extremities with normal pulses.  NEURO:  He has weakness of the biceps and left triceps.  Reflexes 1+ ,  __________ and he complains of a tingling sensation in the  hands.  Coordination and gait normal.   Cervical spine x-ray showed that he had a spondylosis at the levels of  C4-5, C5-6, C6-7.  The area where he had surgery at the level of C3-4 is  normal.   IMPRESSION:  Cervical spondylosis C5-C6, C6-C7.   RECOMMENDATIONS:  Richard Franco is being taken for surgery.  The  procedure will be anterior cervical diskectomy of the C5-6 and C6-7  followed by graft and plate.  He knows about the risks of infection, CSF  leak, worsening pain, paralysis, no improvement whatsoever, damage to  the vocal cord, damage to the arteries in the neck.  He is fully aware  that the risks are similar to the one when he had the anterior fusion of  the C3-4.           ______________________________  Hilda Lias, M.D.     EB/MEDQ  D:  08/14/2006  T:  08/15/2006  Job:  [id number]

## 2010-11-09 NOTE — Op Note (Signed)
NAME:  KASHEEM, TONER NO.:  [id number]   MEDICAL RECORD NO.:  [id number]          PATIENT TYPE:  INP   LOCATION:  3007                         FACILITY:  [facility]   PHYSICIAN:  Hilda Lias, M.D.   DATE OF BIRTH:  02-Apr-1966   DATE OF PROCEDURE:  08/14/2006  DATE OF DISCHARGE:                               OPERATIVE REPORT   PREOPERATIVE DIAGNOSIS:  C5-6, C6-7 spondylosis with chronic  radiculopathy status post C3-C4 fusion.   POSTOPERATIVE DIAGNOSES:  C5-6, C6-7 spondylosis with chronic  radiculopathy status post C3-C4 fusion.   PROCEDURE:  Anterior decompression of 5-6, 6-7.  Bilateral  foraminotomies, interbody fusion with allograft, plate from C5 to C6 and  C7, microscope.   SURGEON:  Hilda Lias, M.D.   ASSISTANT:  Stefani Dama, M.D.   CLINICAL HISTORY:  Mr. Skelley a 45 year old gentleman who in the past  underwent decompression of the level 3-4 and posterior of level 5-6.  I  have been following him for many years and he is getting worse.  He is  complaining of neck pain radiation to both upper extremities associated  with weakness.  The patient had been seen multiple occasions in the  emergency room.  Myelogram showed that he has spondylosis at the level  of 5-6 and 6-7 with stenosis.  Surgery was advised and the risks were  explained in the history and physical.   PROCEDURE:  The patient was taken to the OR and after intubation the  neck was prepped with DuraPrep.  Transverse incision was made through  the skin, subcutaneous tissue down to the cervical area.  The needle  showed that indeed we were at the level 5-6.  From then on we brought  the microscope into the area.  Anterior ligament was opened.  At the  level 5-6, we found as well as the level 6-7 found quite a bit of  degenerative disk disease.  At the level of 5-6, we found spondylosis.  Decompression with the 1 and 2-mm Kerrison punch was achieved.  The  right side was worse than  the left one.  Good decompression of the  spinal cord was achieved.  Then at the level C6-7 we found the same  finding in the left worse than the right side.  Decompression of the  foramen was achieved.  The endplates at both levels were drilled and two  pieces of allograft of 4 mm was inserted followed by a plate using six  screws.  Lateral cervical spine showed good position of the graft.  The  patient during the procedure had tendency to ooze a little blood.  Although we achieved a good hemostasis, nevertheless, we left drain in  the precervical area.  From then on the area was irrigated, the wound  was closed with Vicryl and Steri-Strips.           ______________________________  Hilda Lias, M.D.     EB/MEDQ  D:  08/14/2006  T:  08/15/2006  Job:  [id number]

## 2010-11-09 NOTE — Op Note (Signed)
NAME:  Richard Franco, Richard Franco                        ACCOUNT NO.:  [id number]   MEDICAL RECORD NO.:  [id number]                   PATIENT TYPE:  OIB   LOCATION:  3009                                 FACILITY:  [facility]   PHYSICIAN:  Hilda Lias, M.D.                DATE OF BIRTH:  05/15/1966   DATE OF PROCEDURE:  09/14/2002  DATE OF DISCHARGE:                                 OPERATIVE REPORT   PREOPERATIVE DIAGNOSIS:  Chronic left C6 radiculopathy secondary to  spondylosis.   POSTOPERATIVE DIAGNOSIS:  Chronic left C6 radiculopathy secondary to  spondylosis.   PROCEDURE:  Left C5-6 foraminotomy, microscope, C-arm, [employer] system.   SURGEON:  Hilda Lias, M.D.   ASSISTANT:  Hewitt Shorts, M.D.   CLINICAL HISTORY:  The patient is a gentleman complaining of neck pain with  radiation down to the left upper extremity, which has failed with  conservative treatment.  Previously he had a fusion at the level of 3-4.  X-  rays show spondylosis at the level of 5-6 affecting the C6 nerve root.  The  patient wanted to go ahead with surgery because he was not any better.  The  risks were explained in the history and physical.   DESCRIPTION OF PROCEDURE:  The patient was taken to the OR and after  intubation, three pins were applied to the head.  He was positioned in a  seated manner.  The neck was prepped with Betadine.  Using the C-arm we  identified the area between 5-6 on the left side about a quarter of an inch  from the midline.  Then infiltration was made with Xylocaine.  Incision was  carried down and dilator was inserted until we found the area between 5-6.  From then on, we brought the microscope into the area.  This was done using  the C-arm.  With the microscope we visualized the lower lamina of 5 and the  upper of 6 as well as the facet.  We started drilling and we found the C6  nerve root.  The C6 nerve root was found to be flat.  There was quite a bit  of narrowing.  Using  the 1 and 2 mm Kerrison punch as well as a drill, we  did a foraminotomy to decompress the nerve root.  Investigation in the  axilla and __________ was negative for any herniated disk.  Having done  this, the area was irrigated.  Depo-Medrol was left in the epidural space,  and the wound was closed with Vicryl and Steri-Strip.                                               Hilda Lias, M.D.    EB/MEDQ  D:  09/14/2002  T:  09/14/2002  Job:  [id number]  

## 2010-11-09 NOTE — H&P (Signed)
NAME:  Richard Franco, Richard Franco                        ACCOUNT NO.:  [id number]   MEDICAL RECORD NO.:  [id number]                   PATIENT TYPE:  OIB   LOCATION:  3009                                 FACILITY:  [facility]   PHYSICIAN:  Hilda Lias, M.D.                DATE OF BIRTH:  [birth year]   DATE OF ADMISSION:  09/14/2002  DATE OF DISCHARGE:                                HISTORY & PHYSICAL   HISTORY OF PRESENT ILLNESS:  The patient is a gentleman who in the past  underwent anterior C3-4 fusion.  For the past two years, he has been  complaining of neck pain with radiation up to the left shoulder and  posterolaterally to the left arm associated with a tingling sensation.  The  patient had had conservative treatment, including selective nerve root  injection without any improvement.  He returned and tells me that the pain  is getting worse.  He had physical therapy and acupuncture with no relief of  the pain.  He complains of a burning sensation in the deltoids.  The patient  had a myelogram followed by a CT scan which showed that indeed at the level  of C5-6 he has spondylosis with narrowing of the canal effecting the C6  nerve root.  Because of that, he wanted to proceed with surgery.   PAST MEDICAL HISTORY:  Anterior cervical fusion at the level of C3-4.  Polyp removed from the colon in 1999.   ALLERGIES:  The patient is allergic to [facility].   FAMILY HISTORY:  Unremarkable.   SOCIAL HISTORY:  He smokes a packs of cigarettes a day.  He does not drink.   REVIEW OF SYSTEMS:  Positive for neck pain.   PHYSICAL EXAMINATION:  HEENT:  Normal.  NECK:  He has anterior scar from previous surgery.  He is able ________  extension ________  mostly to the left shoulder.  LUNGS:  Clear.  HEART:  Heart sounds normal.  ABDOMEN:  Normal.  EXTREMITIES:  Normal pulses.  NEUROLOGIC:  Mental status normal.  Cranial nerves normal.  He has weakness  of the left biceps and the left wrist extensor with  some sensory changes.  The right arm is completely normal and so are the lower extremities.  Reflexes 1+.  Coordination normal.   LABORATORY DATA:  The myelogram and CT scan post myelogram showed stenosis  at the level of C5-6 effecting the left C6 level.   CLINICAL IMPRESSION:  Chronic left cervical radiculopathy secondary to  spondylosis at the foramen of C5-6.    RECOMMENDATIONS:  The patient is being admitted for surgery.  The procedure  will be a left C5-6 foraminotomy to decompress the sixth nerve root.  The  surgery was explained to him.  We are going to use the C-arm and the Matrix.  The risks of course are no improvement, need for further surgery, infection,  CSF leak, and  paralysis.                                               Hilda Lias, M.D.    EB/MEDQ  D:  09/14/2002  T:  09/14/2002  Job:  [id number]

## 2011-01-22 ENCOUNTER — Other Ambulatory Visit: Payer: Self-pay | Admitting: Neurosurgery

## 2011-01-22 DIAGNOSIS — M542 Cervicalgia: Secondary | ICD-10-CM

## 2011-01-22 DIAGNOSIS — IMO0002 Reserved for concepts with insufficient information to code with codable children: Secondary | ICD-10-CM

## 2011-01-22 DIAGNOSIS — M5020 Other cervical disc displacement, unspecified cervical region: Secondary | ICD-10-CM

## 2011-01-22 DIAGNOSIS — M479 Spondylosis, unspecified: Secondary | ICD-10-CM

## 2011-01-29 ENCOUNTER — Other Ambulatory Visit: Payer: BC Managed Care – PPO

## 2011-02-05 ENCOUNTER — Ambulatory Visit
Admission: RE | Admit: 2011-02-05 | Discharge: 2011-02-05 | Disposition: A | Discharge status: Home / Self Care | Payer: BC Managed Care – PPO | Source: Ambulatory Visit | Attending: Neurosurgery | Admitting: Neurosurgery

## 2011-02-05 DIAGNOSIS — M479 Spondylosis, unspecified: Secondary | ICD-10-CM

## 2011-02-05 DIAGNOSIS — M542 Cervicalgia: Secondary | ICD-10-CM

## 2011-02-05 DIAGNOSIS — IMO0002 Reserved for concepts with insufficient information to code with codable children: Secondary | ICD-10-CM

## 2011-02-05 DIAGNOSIS — M5020 Other cervical disc displacement, unspecified cervical region: Secondary | ICD-10-CM

## 2011-04-01 LAB — DIFFERENTIAL
Basophils Absolute: 0
Basophils Relative: 0
Eosinophils Absolute: 0.1 — ABNORMAL LOW
Eosinophils Relative: 2
Lymphocytes Relative: 24
Lymphs Abs: 1.8
Monocytes Absolute: 0.5
Monocytes Relative: 7
Neutro Abs: 5
Neutrophils Relative %: 67

## 2011-04-01 LAB — CBC
HCT: 45.4
Hemoglobin: 15.9
MCHC: 35
MCV: 91.4
Platelets: 181
RBC: 4.96
RDW: 13.6
WBC: 7.4

## 2011-04-01 LAB — RAPID URINE DRUG SCREEN, HOSP PERFORMED
Amphetamines: NOT DETECTED
Barbiturates: NOT DETECTED
Benzodiazepines: POSITIVE — AB
Cocaine: NOT DETECTED
Opiates: NOT DETECTED
Tetrahydrocannabinol: NOT DETECTED

## 2011-04-01 LAB — BASIC METABOLIC PANEL
BUN: 7
CO2: 32
Calcium: 9.8
Chloride: 101
Creatinine, Ser: 1.12
GFR calc Af Amer: >60
GFR calc non Af Amer: >60
Glucose, Bld: 41 — ABNORMAL LOW
Potassium: 4.2
Sodium: 139

## 2011-04-01 LAB — ETHANOL: Alcohol, Ethyl (B): <5

## 2011-05-01 ENCOUNTER — Encounter (HOSPITAL_COMMUNITY): Payer: Self-pay | Admitting: Pharmacy Technician

## 2011-05-02 ENCOUNTER — Encounter (HOSPITAL_COMMUNITY): Payer: Self-pay

## 2011-05-02 ENCOUNTER — Encounter (HOSPITAL_COMMUNITY)
Admission: RE | Admit: 2011-05-02 | Discharge: 2011-05-02 | Disposition: A | Discharge status: Home / Self Care | Payer: BC Managed Care – PPO | Source: Ambulatory Visit | Attending: Neurosurgery | Admitting: Neurosurgery

## 2011-05-02 HISTORY — DX: Depression, unspecified: F32.A

## 2011-05-02 HISTORY — DX: Major depressive disorder, single episode, unspecified: F32.9

## 2011-05-02 HISTORY — DX: Unspecified osteoarthritis, unspecified site: M19.90

## 2011-05-02 HISTORY — DX: Fibromyalgia: M79.7

## 2011-05-02 LAB — BASIC METABOLIC PANEL
BUN: 12 mg/dL (ref 6–23)
CO2: 31 mEq/L (ref 19–32)
Calcium: 10.3 mg/dL (ref 8.4–10.5)
Chloride: 101 mEq/L (ref 96–112)
Creatinine, Ser: 1.22 mg/dL (ref 0.50–1.35)
GFR calc Af Amer: 82 mL/min — ABNORMAL LOW (ref >90)
GFR calc non Af Amer: 71 mL/min — ABNORMAL LOW (ref >90)
Glucose, Bld: 72 mg/dL (ref 70–99)
Potassium: 4.5 mEq/L (ref 3.5–5.1)
Sodium: 140 mEq/L (ref 135–145)

## 2011-05-02 LAB — CBC
HCT: 43.4 % (ref 39.0–52.0)
Hemoglobin: 15 g/dL (ref 13.0–17.0)
MCH: 31.1 pg (ref 26.0–34.0)
MCHC: 34.6 g/dL (ref 30.0–36.0)
MCV: 90 fL (ref 78.0–100.0)
Platelets: 170 10*3/uL (ref 150–400)
RBC: 4.82 MIL/uL (ref 4.22–5.81)
RDW: 12.9 % (ref 11.5–15.5)
WBC: 6.9 10*3/uL (ref 4.0–10.5)

## 2011-05-02 LAB — SURGICAL PCR SCREEN
MRSA, PCR: POSITIVE — AB
Staphylococcus aureus: POSITIVE — AB

## 2011-05-02 MED ORDER — CEFAZOLIN SODIUM 1-5 GM-% IV SOLN: 1.0000 g | INTRAVENOUS | Status: DC

## 2011-05-02 NOTE — Progress Notes (Signed)
Pt has echo and cards workup from 2011 from [facility] in system.

## 2011-05-02 NOTE — Pre-Procedure Instructions (Addendum)
20 BYRNE CAPEK  05/02/2011   Your procedure is scheduled on: 05/09/2011  Report to [facility] at 0530 AM.  Call this number if you have problems the morning of surgery: [phone]   Remember:   Do not eat food:After Midnight.  Do not drink clear liquids: 4 Hours before arrival.  Take these medicines the morning of surgery with A SIP OF WATER: alprazolam,dilaudid.protonix,inderal   Do not wear jewelry, make-up or nail polish.  Do not wear lotions, powders, or perfumes. You may wear deodorant.  Do not shave 48 hours prior to surgery.  Do not bring valuables to the hospital.  Contacts, dentures or bridgework may not be worn into surgery.  Leave suitcase in the car. After surgery it may be brought to your room.  For patients admitted to the hospital, checkout time is 11:00 AM the day of discharge.   Patients discharged the day of surgery will not be allowed to drive home.  Name and phone number of your driver: family  Special Instructions: CHG Shower Use Special Wash: 1/2 bottle night before surgery and 1/2 bottle morning of surgery.   Please read over the following fact sheets that you were given: pain.chg showers,mrsa information,coughing and deep breathing,surgical site infections.

## 2011-05-08 MED ORDER — CEFAZOLIN SODIUM 1-5 GM-% IV SOLN
1.0000 g | INTRAVENOUS | Status: DC
  Filled 2011-05-08: qty 50

## 2011-05-08 MED ORDER — CEFAZOLIN SODIUM-DEXTROSE 2-3 GM-% IV SOLR
2.0000 g | INTRAVENOUS | Status: AC
  Administered 2011-05-09: 2 g via INTRAVENOUS
  Filled 2011-05-08: qty 50

## 2011-05-09 ENCOUNTER — Ambulatory Visit (HOSPITAL_COMMUNITY): Payer: BC Managed Care – PPO

## 2011-05-09 ENCOUNTER — Encounter (HOSPITAL_COMMUNITY): Payer: Self-pay | Admitting: *Deleted

## 2011-05-09 ENCOUNTER — Encounter (HOSPITAL_COMMUNITY)
Admission: RE | Disposition: A | Discharge status: Home / Self Care | Payer: Self-pay | Source: Ambulatory Visit | Attending: Neurosurgery

## 2011-05-09 ENCOUNTER — Ambulatory Visit (HOSPITAL_COMMUNITY)
Admission: RE | Admit: 2011-05-09 | Discharge: 2011-05-10 | Disposition: A | Discharge status: Home / Self Care | Payer: BC Managed Care – PPO | Source: Ambulatory Visit | Attending: Neurosurgery | Admitting: Neurosurgery

## 2011-05-09 ENCOUNTER — Ambulatory Visit (HOSPITAL_COMMUNITY): Payer: BC Managed Care – PPO | Admitting: *Deleted

## 2011-05-09 DIAGNOSIS — Y838 Other surgical procedures as the cause of abnormal reaction of the patient, or of later complication, without mention of misadventure at the time of the procedure: Secondary | ICD-10-CM | POA: Insufficient documentation

## 2011-05-09 DIAGNOSIS — F17200 Nicotine dependence, unspecified, uncomplicated: Secondary | ICD-10-CM | POA: Insufficient documentation

## 2011-05-09 DIAGNOSIS — Z01812 Encounter for preprocedural laboratory examination: Secondary | ICD-10-CM | POA: Insufficient documentation

## 2011-05-09 DIAGNOSIS — K219 Gastro-esophageal reflux disease without esophagitis: Secondary | ICD-10-CM | POA: Insufficient documentation

## 2011-05-09 DIAGNOSIS — T84498A Other mechanical complication of other internal orthopedic devices, implants and grafts, initial encounter: Secondary | ICD-10-CM | POA: Insufficient documentation

## 2011-05-09 HISTORY — PX: POSTERIOR CERVICAL FUSION/FORAMINOTOMY: SHX5038

## 2011-05-09 SURGERY — POSTERIOR CERVICAL FUSION/FORAMINOTOMY LEVEL 1
Anesthesia: General

## 2011-05-09 MED ORDER — HYDROMORPHONE HCL PF 1 MG/ML IJ SOLN
1.0000 mg | INTRAMUSCULAR | Status: DC | PRN
  Administered 2011-05-09: 1 mg via INTRAMUSCULAR

## 2011-05-09 MED ORDER — MENTHOL 3 MG MT LOZG: 1.0000 | LOZENGE | OROMUCOSAL | Status: DC | PRN

## 2011-05-09 MED ORDER — DOCUSATE SODIUM 100 MG PO CAPS
100.0000 mg | ORAL_CAPSULE | Freq: Two times a day (BID) | ORAL | Status: DC
  Administered 2011-05-09 – 2011-05-10 (×3): 100 mg via ORAL
  Filled 2011-05-09 (×3): qty 1

## 2011-05-09 MED ORDER — SILODOSIN 8 MG PO CAPS
8.0000 mg | ORAL_CAPSULE | ORAL | Status: DC
  Filled 2011-05-09 (×2): qty 1

## 2011-05-09 MED ORDER — PHENYLEPHRINE HCL 10 MG/ML IJ SOLN
INTRAMUSCULAR | Status: DC | PRN
  Administered 2011-05-09: 120 ug via INTRAVENOUS
  Administered 2011-05-09 (×2): 80 ug via INTRAVENOUS

## 2011-05-09 MED ORDER — SODIUM CHLORIDE 0.9 % IJ SOLN: 3.0000 mL | INTRAMUSCULAR | Status: DC | PRN

## 2011-05-09 MED ORDER — HYDROMORPHONE HCL 2 MG PO TABS
2.0000 mg | ORAL_TABLET | ORAL | Status: DC | PRN
  Administered 2011-05-09 – 2011-05-10 (×6): 4 mg via ORAL
  Filled 2011-05-09 (×6): qty 2

## 2011-05-09 MED ORDER — ALPRAZOLAM 0.5 MG PO TABS
1.0000 mg | ORAL_TABLET | Freq: Three times a day (TID) | ORAL | Status: DC
  Administered 2011-05-09 – 2011-05-10 (×2): 1 mg via ORAL
  Filled 2011-05-09: qty 1
  Filled 2011-05-09 (×2): qty 2
  Filled 2011-05-09: qty 1

## 2011-05-09 MED ORDER — VANCOMYCIN HCL IN DEXTROSE 1-5 GM/200ML-% IV SOLN
1000.0000 mg | Freq: Once | INTRAVENOUS | Status: AC
  Administered 2011-05-09: 1000 mg via INTRAVENOUS
  Filled 2011-05-09: qty 200

## 2011-05-09 MED ORDER — KETOROLAC TROMETHAMINE 30 MG/ML IJ SOLN
30.0000 mg | Freq: Four times a day (QID) | INTRAMUSCULAR | Status: DC
  Administered 2011-05-09 – 2011-05-10 (×3): 30 mg via INTRAVENOUS
  Filled 2011-05-09 (×6): qty 1

## 2011-05-09 MED ORDER — ACETAMINOPHEN 10 MG/ML IV SOLN
1000.0000 mg | Freq: Four times a day (QID) | INTRAVENOUS | Status: DC
  Administered 2011-05-09: 1000 mg via INTRAVENOUS
  Filled 2011-05-09 (×3): qty 100

## 2011-05-09 MED ORDER — VECURONIUM BROMIDE 10 MG IV SOLR
INTRAVENOUS | Status: DC | PRN
  Administered 2011-05-09: 5 mg via INTRAVENOUS

## 2011-05-09 MED ORDER — ACETAMINOPHEN 650 MG RE SUPP: 650.0000 mg | RECTAL | Status: DC | PRN

## 2011-05-09 MED ORDER — HYDROXYZINE HCL 25 MG PO TABS: 50.0000 mg | ORAL_TABLET | ORAL | Status: DC | PRN

## 2011-05-09 MED ORDER — SODIUM CHLORIDE 0.9 % IR SOLN
Status: DC | PRN
  Administered 2011-05-09: 08:00:00

## 2011-05-09 MED ORDER — MIDAZOLAM HCL 5 MG/5ML IJ SOLN
INTRAMUSCULAR | Status: DC | PRN
  Administered 2011-05-09: 2 mg via INTRAVENOUS

## 2011-05-09 MED ORDER — MORPHINE SULFATE 4 MG/ML IJ SOLN
4.0000 mg | INTRAMUSCULAR | Status: DC | PRN
  Administered 2011-05-09: 8 mg via INTRAMUSCULAR
  Administered 2011-05-09 – 2011-05-10 (×4): 4 mg via INTRAMUSCULAR
  Filled 2011-05-09 (×4): qty 1
  Filled 2011-05-09: qty 2

## 2011-05-09 MED ORDER — ONDANSETRON HCL 4 MG/2ML IJ SOLN
INTRAMUSCULAR | Status: DC | PRN
  Administered 2011-05-09: 4 mg via INTRAVENOUS

## 2011-05-09 MED ORDER — DEXTROSE-NACL 5-0.45 % IV SOLN
INTRAVENOUS | Status: DC
  Administered 2011-05-09: 15:00:00 via INTRAVENOUS

## 2011-05-09 MED ORDER — PANTOPRAZOLE SODIUM 40 MG PO TBEC
40.0000 mg | DELAYED_RELEASE_TABLET | Freq: Two times a day (BID) | ORAL | Status: DC
Start: 2011-05-09 — End: 2011-05-10
  Administered 2011-05-09 – 2011-05-10 (×2): 40 mg via ORAL
  Filled 2011-05-09 (×3): qty 1

## 2011-05-09 MED ORDER — GLYCOPYRROLATE 0.2 MG/ML IJ SOLN
INTRAMUSCULAR | Status: DC | PRN
  Administered 2011-05-09: .5 mg via INTRAVENOUS

## 2011-05-09 MED ORDER — LIDOCAINE-EPINEPHRINE 1 %-1:100000 IJ SOLN
INTRAMUSCULAR | Status: DC | PRN
  Administered 2011-05-09: 30 mL

## 2011-05-09 MED ORDER — LIDOCAINE-PRILOCAINE 2.5-2.5 % EX CREA: 1.0000 "application " | TOPICAL_CREAM | Freq: Once | CUTANEOUS | Status: DC

## 2011-05-09 MED ORDER — ALUM & MAG HYDROXIDE-SIMETH 400-400-40 MG/5ML PO SUSP
30.0000 mL | Freq: Four times a day (QID) | ORAL | Status: DC | PRN
  Administered 2011-05-10 (×2): 30 mL via ORAL
  Filled 2011-05-09: qty 30

## 2011-05-09 MED ORDER — KETOROLAC TROMETHAMINE 30 MG/ML IJ SOLN
30.0000 mg | Freq: Four times a day (QID) | INTRAMUSCULAR | Status: DC
  Administered 2011-05-09: 30 mg via INTRAVENOUS
  Filled 2011-05-09: qty 1

## 2011-05-09 MED ORDER — ONDANSETRON HCL 4 MG/2ML IJ SOLN: 4.0000 mg | Freq: Once | INTRAMUSCULAR | Status: DC | PRN

## 2011-05-09 MED ORDER — ZOLPIDEM TARTRATE 5 MG PO TABS: 10.0000 mg | ORAL_TABLET | Freq: Every evening | ORAL | Status: DC | PRN

## 2011-05-09 MED ORDER — BUPIVACAINE HCL (PF) 0.5 % IJ SOLN
INTRAMUSCULAR | Status: DC | PRN
  Administered 2011-05-09: 30 mL

## 2011-05-09 MED ORDER — TIZANIDINE HCL 4 MG PO TABS
4.0000 mg | ORAL_TABLET | Freq: Three times a day (TID) | ORAL | Status: DC | PRN
  Administered 2011-05-09: 4 mg via ORAL
  Filled 2011-05-09: qty 1

## 2011-05-09 MED ORDER — SODIUM CHLORIDE 0.9 % IR SOLN
Status: DC | PRN
  Administered 2011-05-09: 1000 mL

## 2011-05-09 MED ORDER — HEMOSTATIC AGENTS (NO CHARGE) OPTIME
TOPICAL | Status: DC | PRN
  Administered 2011-05-09: 1 via TOPICAL

## 2011-05-09 MED ORDER — LACTATED RINGERS IV SOLN
INTRAVENOUS | Status: DC | PRN
  Administered 2011-05-09: 07:00:00 via INTRAVENOUS

## 2011-05-09 MED ORDER — CYCLOBENZAPRINE HCL 10 MG PO TABS
10.0000 mg | ORAL_TABLET | Freq: Three times a day (TID) | ORAL | Status: DC | PRN
  Administered 2011-05-09 – 2011-05-10 (×2): 10 mg via ORAL
  Filled 2011-05-09: qty 1

## 2011-05-09 MED ORDER — LAMOTRIGINE 150 MG PO TABS
150.0000 mg | ORAL_TABLET | Freq: Every day | ORAL | Status: DC
  Administered 2011-05-09: 150 mg via ORAL
  Filled 2011-05-09 (×2): qty 1

## 2011-05-09 MED ORDER — ACETAMINOPHEN 325 MG PO TABS: 650.0000 mg | ORAL_TABLET | ORAL | Status: DC | PRN

## 2011-05-09 MED ORDER — SODIUM CHLORIDE 0.9 % IJ SOLN
3.0000 mL | Freq: Two times a day (BID) | INTRAMUSCULAR | Status: DC
  Administered 2011-05-09 – 2011-05-10 (×2): 3 mL via INTRAVENOUS

## 2011-05-09 MED ORDER — HYDROMORPHONE HCL PF 1 MG/ML IJ SOLN
0.2500 mg | INTRAMUSCULAR | Status: DC | PRN
  Administered 2011-05-09 (×4): 0.5 mg via INTRAVENOUS

## 2011-05-09 MED ORDER — PHENOL 1.4 % MT LIQD: 1.0000 | OROMUCOSAL | Status: DC | PRN

## 2011-05-09 MED ORDER — PROPRANOLOL HCL 80 MG PO TABS
80.0000 mg | ORAL_TABLET | Freq: Two times a day (BID) | ORAL | Status: DC
Start: 2011-05-09 — End: 2011-05-10
  Administered 2011-05-09 – 2011-05-10 (×2): 80 mg via ORAL
  Filled 2011-05-09 (×4): qty 1

## 2011-05-09 MED ORDER — HYDROXYZINE HCL 50 MG/ML IM SOLN: 50.0000 mg | INTRAMUSCULAR | Status: DC | PRN

## 2011-05-09 MED ORDER — THROMBIN 20000 UNITS EX KIT
PACK | CUTANEOUS | Status: DC | PRN
  Administered 2011-05-09: 20000 [IU] via TOPICAL

## 2011-05-09 MED ORDER — TRIMETHOPRIM 100 MG PO TABS
100.0000 mg | ORAL_TABLET | Freq: Two times a day (BID) | ORAL | Status: DC
  Administered 2011-05-09 – 2011-05-10 (×2): 100 mg via ORAL
  Filled 2011-05-09 (×4): qty 1

## 2011-05-09 MED ORDER — NEOSTIGMINE METHYLSULFATE 1 MG/ML IJ SOLN
INTRAMUSCULAR | Status: DC | PRN
  Administered 2011-05-09: 3 mg via INTRAVENOUS

## 2011-05-09 MED ORDER — SODIUM CHLORIDE 0.9 % IV SOLN: 250.0000 mL | INTRAVENOUS | Status: DC

## 2011-05-09 MED ORDER — MEPERIDINE HCL 25 MG/ML IJ SOLN
6.2500 mg | INTRAMUSCULAR | Status: DC | PRN
Start: 2011-05-09 — End: 2011-05-09

## 2011-05-09 MED ORDER — FLUOXETINE HCL 20 MG PO CAPS
40.0000 mg | ORAL_CAPSULE | Freq: Every day | ORAL | Status: DC
  Administered 2011-05-10: 40 mg via ORAL
  Filled 2011-05-09 (×2): qty 2

## 2011-05-09 MED ORDER — KETOROLAC TROMETHAMINE 30 MG/ML IJ SOLN
30.0000 mg | Freq: Once | INTRAMUSCULAR | Status: AC
  Administered 2011-05-09: 30 mg via INTRAVENOUS

## 2011-05-09 MED ORDER — LACTATED RINGERS IV SOLN
INTRAVENOUS | Status: DC | PRN
  Administered 2011-05-09 (×3): via INTRAVENOUS

## 2011-05-09 MED ORDER — EPHEDRINE SULFATE 50 MG/ML IJ SOLN
INTRAMUSCULAR | Status: DC | PRN
  Administered 2011-05-09 (×2): 10 mg via INTRAVENOUS

## 2011-05-09 MED ORDER — PROPOFOL 10 MG/ML IV EMUL
INTRAVENOUS | Status: DC | PRN
  Administered 2011-05-09: 150 mg via INTRAVENOUS
  Administered 2011-05-09: 20 mg via INTRAVENOUS
  Administered 2011-05-09: 50 mg via INTRAVENOUS
  Administered 2011-05-09: 30 mg via INTRAVENOUS

## 2011-05-09 MED ORDER — ROCURONIUM BROMIDE 100 MG/10ML IV SOLN
INTRAVENOUS | Status: DC | PRN
  Administered 2011-05-09: 50 mg via INTRAVENOUS

## 2011-05-09 MED ORDER — FENTANYL CITRATE 0.05 MG/ML IJ SOLN
INTRAMUSCULAR | Status: DC | PRN
  Administered 2011-05-09 (×10): 50 ug via INTRAVENOUS

## 2011-05-09 SURGICAL SUPPLY — 71 items
ADH SKN CLS APL DERMABOND .7 (GAUZE/BANDAGES/DRESSINGS)
APL SKNCLS STERI-STRIP NONHPOA (GAUZE/BANDAGES/DRESSINGS) ×2
BAG DECANTER FOR FLEXI CONT (MISCELLANEOUS) ×2 IMPLANT
BANDAGE GAUZE 4  KLING STR (GAUZE/BANDAGES/DRESSINGS) IMPLANT
BENZOIN TINCTURE PRP APPL 2/3 (GAUZE/BANDAGES/DRESSINGS) ×4 IMPLANT
BIT DRILL NEURO 2X3.1 SFT TUCH (MISCELLANEOUS) ×1 IMPLANT
BLADE SURG 11 STRL SS (BLADE) ×2 IMPLANT
BLADE SURG ROTATE 9660 (MISCELLANEOUS) ×2 IMPLANT
BRUSH SCRUB EZ 1% IODOPHOR (MISCELLANEOUS) ×1 IMPLANT
BUR ROUTER 1.1 (BURR) ×1 IMPLANT
CANISTER SUCTION 2500CC (MISCELLANEOUS) ×2 IMPLANT
CLOTH BEACON ORANGE TIMEOUT ST (SAFETY) ×2 IMPLANT
CONT SPEC 4OZ CLIKSEAL STRL BL (MISCELLANEOUS) ×2 IMPLANT
COVER MAYO STAND STRL (DRAPES) IMPLANT
COVER TABLE BACK 60X90 (DRAPES) ×1 IMPLANT
DECANTER SPIKE VIAL GLASS SM (MISCELLANEOUS) ×3 IMPLANT
DERMABOND ADVANCED (GAUZE/BANDAGES/DRESSINGS)
DERMABOND ADVANCED .7 DNX12 (GAUZE/BANDAGES/DRESSINGS) IMPLANT
DRAPE C-ARM 42X72 X-RAY (DRAPES) ×4 IMPLANT
DRAPE LAPAROTOMY 100X72 PEDS (DRAPES) ×2 IMPLANT
DRAPE POUCH INSTRU U-SHP 10X18 (DRAPES) ×2 IMPLANT
DRAPE PROXIMA HALF (DRAPES) IMPLANT
DRILL NEURO 2X3.1 SOFT TOUCH (MISCELLANEOUS) ×2
DRSG EMULSION OIL 3X3 NADH (GAUZE/BANDAGES/DRESSINGS) ×1 IMPLANT
ELECT REM PT RETURN 9FT ADLT (ELECTROSURGICAL) ×2
ELECTRODE REM PT RTRN 9FT ADLT (ELECTROSURGICAL) ×1 IMPLANT
EVACUATOR 1/8 PVC DRAIN (DRAIN) IMPLANT
GAUZE SPONGE 4X4 16PLY XRAY LF (GAUZE/BANDAGES/DRESSINGS) IMPLANT
GLOVE BIOGEL PI IND STRL 8 (GLOVE) ×1 IMPLANT
GLOVE BIOGEL PI INDICATOR 8 (GLOVE) ×1
GLOVE ECLIPSE 7.5 STRL STRAW (GLOVE) ×2 IMPLANT
GLOVE EXAM NITRILE LRG STRL (GLOVE) IMPLANT
GLOVE EXAM NITRILE MD LF STRL (GLOVE) IMPLANT
GLOVE EXAM NITRILE XL STR (GLOVE) IMPLANT
GLOVE EXAM NITRILE XS STR PU (GLOVE) IMPLANT
GOWN BRE IMP SLV AUR LG STRL (GOWN DISPOSABLE) IMPLANT
GOWN BRE IMP SLV AUR XL STRL (GOWN DISPOSABLE) ×2 IMPLANT
GOWN STRL REIN 2XL LVL4 (GOWN DISPOSABLE) IMPLANT
HEMOSTAT SURGICEL 2X14 (HEMOSTASIS) IMPLANT
KIT BASIN OR (CUSTOM PROCEDURE TRAY) ×2 IMPLANT
KIT INFUSE XX SMALL 0.7CC (Orthopedic Implant) ×1 IMPLANT
KIT ROOM TURNOVER OR (KITS) ×2 IMPLANT
NDL HYPO 18GX1.5 BLUNT FILL (NEEDLE) IMPLANT
NDL SPNL 18GX3.5 QUINCKE PK (NEEDLE) ×1 IMPLANT
NDL SPNL 22GX3.5 QUINCKE BK (NEEDLE) ×1 IMPLANT
NEEDLE HYPO 18GX1.5 BLUNT FILL (NEEDLE) IMPLANT
NEEDLE SPNL 18GX3.5 QUINCKE PK (NEEDLE) ×2 IMPLANT
NEEDLE SPNL 22GX3.5 QUINCKE BK (NEEDLE) ×2 IMPLANT
NS IRRIG 1000ML POUR BTL (IV SOLUTION) ×2 IMPLANT
PACK LAMINECTOMY NEURO (CUSTOM PROCEDURE TRAY) ×2 IMPLANT
PACK VITOSS BIOACTIVE 5CC (Neuro Prosthesis/Implant) ×1 IMPLANT
PAD ARMBOARD 7.5X6 YLW CONV (MISCELLANEOUS) ×5 IMPLANT
PIN MAYFIELD SKULL DISP (PIN) ×2 IMPLANT
ROD 120MM 3.5MM (Rod) ×1 IMPLANT
SCREW MULTI AXIAL 3.5X14MM L (Screw) ×2 IMPLANT
SET SCRE TULIP SCW LAMINAR HK (Orthopedic Implant) ×4 IMPLANT
SPONGE GAUZE 4X4 12PLY (GAUZE/BANDAGES/DRESSINGS) ×1 IMPLANT
SPONGE LAP 4X18 X RAY DECT (DISPOSABLE) IMPLANT
SPONGE SURGIFOAM ABS GEL 100 (HEMOSTASIS) ×2 IMPLANT
STAPLER SKIN PROX WIDE 3.9 (STAPLE) ×2 IMPLANT
SUT ETHILON 3 0 FSL (SUTURE) ×1 IMPLANT
SUT VIC AB 0 CT1 18XCR BRD8 (SUTURE) ×1 IMPLANT
SUT VIC AB 0 CT1 8-18 (SUTURE) ×4
SUT VIC AB 2-0 CP2 18 (SUTURE) ×3 IMPLANT
SYR 20ML ECCENTRIC (SYRINGE) ×2 IMPLANT
TAPE CLOTH SURG 4X10 WHT LF (GAUZE/BANDAGES/DRESSINGS) ×1 IMPLANT
TOWEL OR 17X24 6PK STRL BLUE (TOWEL DISPOSABLE) ×2 IMPLANT
TOWEL OR 17X26 10 PK STRL BLUE (TOWEL DISPOSABLE) ×2 IMPLANT
TRAY FOLEY CATH 14FRSI W/METER (CATHETERS) ×1 IMPLANT
UNDERPAD 30X30 INCONTINENT (UNDERPADS AND DIAPERS) ×2 IMPLANT
WATER STERILE IRR 1000ML POUR (IV SOLUTION) ×2 IMPLANT

## 2011-05-09 NOTE — Progress Notes (Signed)
Pt. With MRSA, isolation maintained,

## 2011-05-09 NOTE — H&P (Signed)
Subjective: Patient is a 45 y.o. male who is admitted for treatment of nonunion and pseudoarthrosis at the C4-5 level. He has had multiple anterior and posterior cervical spine surgeries. He has solid fusions at C3-4 C5-6 and C6-7. He did require a C5-6 and C6-7 posterior cervical supplemental arthrodesis. He now has a pseudoarthrosis and nonunion and a C4-5 ACDF. He is admitted for a C4-5 posterior cervical arthrodesis with lateral mass screws rods and bone graft.   Patient Active Problem List  Diagnoses Date Noted  . CONSTRICTIVE PERICARDITIS 08/24/2009  . DYSPNEA 08/24/2009  . CHEST PAIN-UNSPECIFIED 08/24/2009  . ABFND, FALSE POSITIVE SEROLOGIC TEST, [facility] 01/12/2007  . OSTEOARTHRITIS 01/05/2007  . FUO 01/05/2007  . HEADACHE 01/05/2007   Past Medical History  Diagnosis Date  . Headache pt takes inderal for headaches  . Arthritis   . Fibromyalgia   . Depression     Past Surgical History  Procedure Date  . Fracture surgery left ankle    Prescriptions prior to admission  Medication Sig Dispense Refill  . ALPRAZolam (XANAX) 1 MG tablet Take 1 mg by mouth 3 (three) times daily.        Marland Kitchen FLUoxetine (PROZAC) 40 MG capsule Take 40 mg by mouth daily.        Marland Kitchen HYDROmorphone (DILAUDID) 2 MG tablet Take 2 mg by mouth 3 (three) times daily as needed. For neck pain       . lamoTRIgine (LAMICTAL) 100 MG tablet Take 150 mg by mouth at bedtime.        . pantoprazole (PROTONIX) 40 MG tablet Take 40 mg by mouth 2 (two) times daily.        . propranolol (INDERAL) 40 MG tablet Take 80 mg by mouth 2 (two) times daily.        . silodosin (RAPAFLO) 8 MG CAPS capsule Take 8 mg by mouth 1 day or 1 dose.        Marland Kitchen tiZANidine (ZANAFLEX) 4 MG tablet Take 4 mg by mouth 3 (three) times daily as needed. For muscle spasms       . trimethoprim (TRIMPEX) 100 MG tablet Take 100 mg by mouth 2 (two) times daily.         Allergies  Allergen Reactions  . Midodrine Hcl   . Sulfonamide Derivatives     REACTION: ?  allergy with rash to sulfa    History  Substance Use Topics  . Smoking status: Current Everyday Smoker -- 1.0 packs/day  . Smokeless tobacco: Current User  . Alcohol Use: No    History reviewed. No pertinent family history.   Review of Systems A comprehensive review of systems was negative.  Objective: Vital signs in last 24 hours: Temp:  [97.8 F (36.6 C)] 97.8 F (36.6 C) (11/15 0628) Pulse Rate:  [65] 65  (11/15 0628) Resp:  [18] 18  (11/15 0628) BP: (97)/(61) 97/61 mmHg (11/15 0628) SpO2:  [97 %] 97 % (11/15 0628)  EXAM: Patient is a well-developed well-nourished male in no acute distress. Lungs are clear to auscultation and has symmetrical respiratory excursion. Heart has a regular rate and rhythm normal S1 and S2. Neurologic examination shows good strength and sensation to the upper extremities.  Data Review:  Assessment/Plan: Nonunion and pseudoarthrosis at C4-5. Admission for posterior cervical arthrodesis.   Hewitt Shorts, MD 05/09/2011 7:16 AM

## 2011-05-09 NOTE — Transfer of Care (Signed)
Immediate Anesthesia Transfer of Care Note  Patient: Richard Franco  Procedure(s) Performed:  POSTERIOR CERVICAL FUSION/FORAMINOTOMY LEVEL 1 - C4/5 posterior arthrodesis with instrumentation  carm, Bertell Maria contacted, MRI on PACS  Patient Location: PACU  Anesthesia Type: General  Level of Consciousness: awake, oriented, patient cooperative and responds to stimulation  Airway & Oxygen Therapy: Patient Spontanous Breathing and Patient connected to nasal cannula oxygen  Post-op Assessment: Report given to PACU RN and Post -op Vital signs reviewed and stable  Post vital signs: Reviewed  Complications: No apparent anesthesia complications

## 2011-05-09 NOTE — Anesthesia Preprocedure Evaluation (Addendum)
Anesthesia Evaluation  Patient identified by MRN, date of birth, ID band Patient awake    Reviewed: Allergy & Precautions, NPO status , Patient's Chart, lab work & pertinent test results  History of Anesthesia Complications Negative for: history of anesthetic complications  Airway Mallampati: II TM Distance: >3 FB Neck ROM: Limited    Dental  (+) Teeth Intact, Dental Advisory Given and Implants   Pulmonary shortness of breath and with exertion, Current Smoker (1ppd x 24 years),  clear to auscultation        Cardiovascular Regular Normal    Neuro/Psych  Headaches, PSYCHIATRIC DISORDERS Depression  Neuromuscular disease    GI/Hepatic Neg liver ROS, GERD-  Medicated,  Endo/Other  Negative Endocrine ROS  Renal/GU negative Renal ROS     Musculoskeletal  (+) Fibromyalgia -, narcotic dependent  Abdominal Normal abdominal exam  (+)   Peds  Hematology negative hematology ROS (+)   Anesthesia Other Findings   Reproductive/Obstetrics negative OB ROS                         Anesthesia Physical Anesthesia Plan  ASA: II  Anesthesia Plan: General   Post-op Pain Management:    Induction: Intravenous  Airway Management Planned: Oral ETT  Additional Equipment:   Intra-op Plan:   Post-operative Plan: Extubation in OR  Informed Consent: I have reviewed the patients History and Physical, chart, labs and discussed the procedure including the risks, benefits and alternatives for the proposed anesthesia with the patient or authorized representative who has indicated his/her understanding and acceptance.   Dental advisory given  Plan Discussed with: Surgeon and CRNA  Anesthesia Plan Comments:       Anesthesia Quick Evaluation

## 2011-05-09 NOTE — Op Note (Signed)
05/09/2011  9:50 AM  PATIENT:  Richard Franco  45 y.o. male  PRE-OPERATIVE DIAGNOSIS: C4-C5 nonunion/pseudoarthrosis, cervicalgia  POST-OPERATIVE DIAGNOSIS: C4-C5 nonunion/pseudoarthrosis, cervicalgia  PROCEDURE: Removal of a portion of existing posterior cervical instrumentation, placement of C4 lateral mass screws, C4-C5 posterior cervical arthrodesis with vuepoint posterior cervical instrumentation Vitoss and infuse  SURGEON:  Surgeon(s): Hewitt Shorts   ASSISTANTS: Maeola Harman   ANESTHESIA:   general  EBL: 25cc  BLOOD ADMINISTERED:none  COUNT: Correct per RN. and staff  DRAINS: none   SPECIMEN:  No Specimen  DICTATION: Patient was brought to the operating room placed under general endotracheal anesthesia the 3 pin Mayfield head holder was applied the patient and the patient was turned to a prone position. The posterior cervical region was shaved and prepped with Betadine soap and solution and draped in a sterile fashion. The midline was infiltrated with local anesthetic with epinephrine. Midline incision made and carried down to the subcutaneous tissue bipolar cautery and electrocautery used to maintain hemostasis. Posterior cervical fascia was incised bilaterally and the paracervical musculature was dissected from the spinous processes and lamina in a subperiosteal fashion. The patient's pre-existing posterior cervical instrumentation was exposed. We then exposed the lamina of C4 and the C4-5 facet joint.  The locking caps on the 6 screws bilaterally were unlocked using a counter torque and then the rods were removed. We then removed the lateral mass screws at C6 and C7 bilaterally and the screw holes were packed with Gelfoam with thrombin to establish hemostasis.  We then identified entry points in the lateral masses of C4 bilaterally and using a inferior medial posterior to superior lateral anterior trajectory drill holes were made in the lateral masses the posterior  cortex was tapped and we placed 3.5 x 14 mm polyaxial screws bilaterally. We then cut a rod to an appropriate length was placed within the screw heads at C4 and C5 bilaterally and secured with locking caps. Each of the locking caps was tightened against a counter torque.  The facet joints at C4-5 and the C4 and C5 lamina were decorticated using a high-speed drill. We then packed a combination of infuse and Vitoss over the lamina and into the facet joints bilaterally at C4-5.  We then proceeded with closure. An x-ray was taken which showed good positioning of the screws at C4 bilaterally. Deep fascia closed with interrupted undyed 0 Vicryl sutures subcutaneous and subcuticular closed with interrupted inverted 2-0 undyed Vicryl sutures and the skin edges were approximated with surgical staples. We'll was dressed with Adaptic and sterile gauze.  Following surgery the patient was turned back in supine position to 3 pin Mayfield head holder was removed and the patient is to be reversed from the anesthetic extubated and transferred to the recovery room for further care.  PLAN OF CARE: Admit for overnight observation  PATIENT DISPOSITION:  PACU - hemodynamically stable.   Delay start of Pharmacological VTE agent (>24hrs) due to surgical blood loss or risk of bleeding:  yes

## 2011-05-09 NOTE — Preoperative (Signed)
Beta Blockers   Reason not to administer Beta Blockers:Not Applicable 

## 2011-05-09 NOTE — Progress Notes (Signed)
Filed Vitals:   05/09/11 1130 05/09/11 1145 05/09/11 1149 05/09/11 1225  BP: 97/55   104/66  Pulse: 78 80  62  Temp:   97.8 F (36.6 C) 97.5 F (36.4 C)  TempSrc:      Resp: 20 25  22   SpO2: 94% 96%  93%   Patient resting comfortably in his bed on 3500. Dressing clean dry moving all extremities well Foley to straight drainage. Plan is to begin to mobilize and progressively increase ambulation and activity   Plan: Progressively mobilize.

## 2011-05-09 NOTE — Anesthesia Procedure Notes (Signed)
Procedure Name: Intubation Date/Time: 05/09/2011 7:40 AM Performed by: Everlene Balls TODD Pre-anesthesia Checklist: Patient identified, Emergency Drugs available, Suction available and Patient being monitored Patient Re-evaluated:Patient Re-evaluated prior to inductionOxygen Delivery Method: Circle System Utilized Preoxygenation: Pre-oxygenation with 100% oxygen Intubation Type: IV induction Ventilation: Mask ventilation without difficulty Laryngoscope Size: Mac and 3 Grade View: Grade II Tube type: Oral Tube size: 7.5 mm Number of attempts: 1 Placement Confirmation: positive ETCO2,  ETT inserted through vocal cords under direct vision and breath sounds checked- equal and bilateral Secured at: 22 cm Tube secured with: Tape Dental Injury: Teeth and Oropharynx as per pre-operative assessment  Comments: Head positioning per patient's comfort, maintained position for intubation.

## 2011-05-09 NOTE — Anesthesia Postprocedure Evaluation (Signed)
  Anesthesia Post-op Note  Patient: Richard Franco  Procedure(s) Performed:  POSTERIOR CERVICAL FUSION/FORAMINOTOMY LEVEL 1 - C4/5 posterior arthrodesis with instrumentation  carm, Bertell Maria contacted, MRI on PACS  Patient Location: PACU  Anesthesia Type: General  Level of Consciousness: alert   Airway and Oxygen Therapy: Patient connected to nasal cannula oxygen  Post-op Pain: moderate  Post-op Assessment: Post-op Vital signs reviewed  Post-op Vital Signs: stable  Complications: No apparent anesthesia complications

## 2011-05-10 ENCOUNTER — Encounter (HOSPITAL_COMMUNITY): Payer: Self-pay | Admitting: Neurosurgery

## 2011-05-10 MED ORDER — HYDROMORPHONE HCL 2 MG PO TABS: 2.0000 mg | ORAL_TABLET | ORAL | Status: DC | PRN

## 2011-05-10 NOTE — Discharge Summary (Signed)
  Physician Discharge Summary  Patient ID: Richard Franco MRN: [id number] DOB/AGE: 44-03-1966 44 y.o.  Admit date: 05/09/2011 Discharge date: 05/10/2011  Admission Diagnoses:Nonunion/pseudoarthrosis C 4/5 Discharge Diagnoses: Nonunion/pseudoarthrosis C 4/5 Active Problems:  * No active hospital problems. *    Discharged Condition: good  Hospital Course: The patient was admitted yesterday. He underwent a C4-5 posterior cervical arthrodesis. Postoperatively he has done well. Pain control has been good. He did request a Foley catheter for the first day postoperatively he was discharged on the morning of his first postoperative day and he has been voiding since. PVRs checked by bladder scan show volume was of 125 cc. He has tolerated that well. He is up and ambulate actively.  Consults: none   Treatments: surgery: C4-5 posterior cervical arthrodesis with lateral mass screws and rods and bone graft.  Discharge Exam: Blood pressure 100/66, pulse 71, temperature 97.9 F (36.6 C), temperature source Oral, resp. rate 20, SpO2 94.00%. Wound is clean and dry. Strength is good.  Disposition: Home   Current Discharge Medication List    CONTINUE these medications which have CHANGED   Details  HYDROmorphone (DILAUDID) 2 MG tablet Take 1-2 tablets (2-4 mg total) by mouth every 4 (four) hours as needed for pain. For neck pain Qty: 75 tablet, Refills: 0      CONTINUE these medications which have NOT CHANGED   Details  ALPRAZolam (XANAX) 1 MG tablet Take 1 mg by mouth 3 (three) times daily.      FLUoxetine (PROZAC) 40 MG capsule Take 40 mg by mouth daily.      lamoTRIgine (LAMICTAL) 100 MG tablet Take 150 mg by mouth at bedtime.      pantoprazole (PROTONIX) 40 MG tablet Take 40 mg by mouth 2 (two) times daily.      propranolol (INDERAL) 40 MG tablet Take 80 mg by mouth 2 (two) times daily.      silodosin (RAPAFLO) 8 MG CAPS capsule Take 8 mg by mouth 1 day or 1 dose.        tiZANidine (ZANAFLEX) 4 MG tablet Take 4 mg by mouth 3 (three) times daily as needed. For muscle spasms     trimethoprim (TRIMPEX) 100 MG tablet Take 100 mg by mouth 2 (two) times daily.           Signed: Hewitt Shorts, MD 05/10/2011, 5:39 PM

## 2011-05-10 NOTE — Progress Notes (Signed)
Filed Vitals:   05/09/11 1900 05/09/11 2025 05/10/11 0017 05/10/11 0415  BP: 123/73 124/74 108/68 114/68  Pulse: 73 81 86 93  Temp: 97.9 F (36.6 C) 98 F (36.7 C) 98.4 F (36.9 C) 99.6 F (37.6 C)  TempSrc:      Resp: 16 18 18 18   SpO2: 92% 95% 90% 95%   Patient up and about ambulating in halls. Dressing DC'd this morning wound is healing nicely no erythema swelling or drainage. Foley DC'd at 0600 and has voided 125 cc but feels a fullness in his lower abdomen and a bladder scan is to be done.  Plan: Will check bladder scan. I've encouraged continued ambulation.

## 2011-06-10 ENCOUNTER — Inpatient Hospital Stay: Admit: 2011-06-10 | Payer: Self-pay | Admitting: Neurosurgery

## 2011-06-10 SURGERY — POSTERIOR CERVICAL FUSION/FORAMINOTOMY LEVEL 1
Anesthesia: General

## 2011-06-25 ENCOUNTER — Emergency Department (HOSPITAL_COMMUNITY)
Admission: EM | Admit: 2011-06-25 | Discharge: 2011-06-25 | Disposition: A | Discharge status: Home / Self Care | Payer: BC Managed Care – PPO | Attending: Emergency Medicine | Admitting: Emergency Medicine

## 2011-06-25 ENCOUNTER — Emergency Department (HOSPITAL_COMMUNITY): Payer: BC Managed Care – PPO

## 2011-06-25 ENCOUNTER — Encounter (HOSPITAL_COMMUNITY): Payer: Self-pay | Admitting: *Deleted

## 2011-06-25 DIAGNOSIS — Z79899 Other long term (current) drug therapy: Secondary | ICD-10-CM | POA: Insufficient documentation

## 2011-06-25 DIAGNOSIS — M129 Arthropathy, unspecified: Secondary | ICD-10-CM | POA: Insufficient documentation

## 2011-06-25 DIAGNOSIS — IMO0001 Reserved for inherently not codable concepts without codable children: Secondary | ICD-10-CM | POA: Insufficient documentation

## 2011-06-25 DIAGNOSIS — Z9889 Other specified postprocedural states: Secondary | ICD-10-CM | POA: Insufficient documentation

## 2011-06-25 DIAGNOSIS — M25559 Pain in unspecified hip: Secondary | ICD-10-CM | POA: Insufficient documentation

## 2011-06-25 DIAGNOSIS — M79609 Pain in unspecified limb: Secondary | ICD-10-CM | POA: Insufficient documentation

## 2011-06-25 DIAGNOSIS — R51 Headache: Secondary | ICD-10-CM | POA: Insufficient documentation

## 2011-06-25 DIAGNOSIS — M542 Cervicalgia: Secondary | ICD-10-CM | POA: Insufficient documentation

## 2011-06-25 DIAGNOSIS — M5412 Radiculopathy, cervical region: Secondary | ICD-10-CM | POA: Insufficient documentation

## 2011-06-25 DIAGNOSIS — R52 Pain, unspecified: Secondary | ICD-10-CM

## 2011-06-25 DIAGNOSIS — R209 Unspecified disturbances of skin sensation: Secondary | ICD-10-CM | POA: Insufficient documentation

## 2011-06-25 LAB — CBC
HCT: 37.6 % — ABNORMAL LOW (ref 39.0–52.0)
Hemoglobin: 12.8 g/dL — ABNORMAL LOW (ref 13.0–17.0)
MCH: 30.4 pg (ref 26.0–34.0)
MCHC: 34 g/dL (ref 30.0–36.0)
MCV: 89.3 fL (ref 78.0–100.0)
Platelets: 168 10*3/uL (ref 150–400)
RBC: 4.21 MIL/uL — ABNORMAL LOW (ref 4.22–5.81)
RDW: 13.5 % (ref 11.5–15.5)
WBC: 16.1 10*3/uL — ABNORMAL HIGH (ref 4.0–10.5)

## 2011-06-25 LAB — COMPREHENSIVE METABOLIC PANEL
ALT: 14 U/L (ref 0–53)
AST: 16 U/L (ref 0–37)
Albumin: 3.6 g/dL (ref 3.5–5.2)
Alkaline Phosphatase: 65 U/L (ref 39–117)
BUN: 11 mg/dL (ref 6–23)
CO2: 26 mEq/L (ref 19–32)
Calcium: 8.6 mg/dL (ref 8.4–10.5)
Chloride: 103 mEq/L (ref 96–112)
Creatinine, Ser: 1.31 mg/dL (ref 0.50–1.35)
GFR calc Af Amer: 75 mL/min — ABNORMAL LOW (ref >90)
GFR calc non Af Amer: 65 mL/min — ABNORMAL LOW (ref >90)
Glucose, Bld: 113 mg/dL — ABNORMAL HIGH (ref 70–99)
Potassium: 3.6 mEq/L (ref 3.5–5.1)
Sodium: 138 mEq/L (ref 135–145)
Total Bilirubin: 0.4 mg/dL (ref 0.3–1.2)
Total Protein: 6.4 g/dL (ref 6.0–8.3)

## 2011-06-25 LAB — DIFFERENTIAL
Basophils Absolute: 0 10*3/uL (ref 0.0–0.1)
Basophils Relative: 0 % (ref 0–1)
Eosinophils Absolute: 0.1 10*3/uL (ref 0.0–0.7)
Eosinophils Relative: 1 % (ref 0–5)
Lymphocytes Relative: 6 % — ABNORMAL LOW (ref 12–46)
Lymphs Abs: 1 10*3/uL (ref 0.7–4.0)
Monocytes Absolute: 0.6 10*3/uL (ref 0.1–1.0)
Monocytes Relative: 4 % (ref 3–12)
Neutro Abs: 14.4 10*3/uL — ABNORMAL HIGH (ref 1.7–7.7)
Neutrophils Relative %: 89 % — ABNORMAL HIGH (ref 43–77)

## 2011-06-25 LAB — URINALYSIS, ROUTINE W REFLEX MICROSCOPIC
Bilirubin Urine: NEGATIVE
Glucose, UA: NEGATIVE mg/dL
Hgb urine dipstick: NEGATIVE
Ketones, ur: 15 mg/dL — AB
Leukocytes, UA: NEGATIVE
Nitrite: NEGATIVE
Protein, ur: NEGATIVE mg/dL
Specific Gravity, Urine: 1.024 (ref 1.005–1.030)
Urobilinogen, UA: 0.2 mg/dL (ref 0.0–1.0)
pH: 6 (ref 5.0–8.0)

## 2011-06-25 MED ORDER — HYDROMORPHONE HCL PF 1 MG/ML IJ SOLN
1.0000 mg | Freq: Once | INTRAMUSCULAR | Status: AC
  Administered 2011-06-25: 1 mg via INTRAVENOUS
  Filled 2011-06-25: qty 1

## 2011-06-25 MED ORDER — HYDROMORPHONE HCL PF 2 MG/ML IJ SOLN
2.0000 mg | INTRAMUSCULAR | Status: AC
  Administered 2011-06-25: 2 mg via INTRAVENOUS
  Filled 2011-06-25: qty 1

## 2011-06-25 MED ORDER — ONDANSETRON HCL 4 MG/2ML IJ SOLN
4.0000 mg | Freq: Once | INTRAMUSCULAR | Status: AC
  Administered 2011-06-25: 4 mg via INTRAVENOUS
  Filled 2011-06-25: qty 2

## 2011-06-25 MED ORDER — SODIUM CHLORIDE 0.9 % IV BOLUS (SEPSIS)
2000.0000 mL | Freq: Once | INTRAVENOUS | Status: AC
  Administered 2011-06-25: 2000 mL via INTRAVENOUS

## 2011-06-25 MED ORDER — OXYCODONE-ACETAMINOPHEN 5-325 MG PO TABS: 2.0000 | ORAL_TABLET | ORAL | Status: AC | PRN

## 2011-06-25 MED ORDER — DIAZEPAM 5 MG PO TABS: 5.0000 mg | ORAL_TABLET | Freq: Two times a day (BID) | ORAL | Status: AC

## 2011-06-25 NOTE — ED Provider Notes (Signed)
History     CSN: [id number]  Arrival date & time 06/25/11  2841   First MD Initiated Contact with Patient 06/25/11 [phone]      Chief Complaint  Patient presents with  . Arm Pain    leg pain, blurry vision    (Consider location/radiation/quality/duration/timing/severity/associated sxs/prior treatment) HPI Patient presents the emergency department after he states he awoke this morning at 1 AM with pain in both hips and his arms neck and head.  Patient states that he has had pain in his hips for a while and he has chronic neck problems.  Patient states that he does not have any numbness or weakness in his extremities.  He states it hurts to move his extremities but there is no other problems.  Patient denies nausea/vomiting/diarrhea, abdominal pain, chest pain, shortness of breath, numbness, upper respiratory symptoms, dysuria, or incontinence of bowel or bladder.  He has noticed numbness around the perineal area.  Patient states that he has been weaning himself down from Dilaudid tablets that he was placed on by Dr. Newell Coral.  The patient states that he has not had this kind of pain in quite some time. Past Medical History  Diagnosis Date  . Headache pt takes inderal for headaches  . Arthritis   . Fibromyalgia   . Depression     Past Surgical History  Procedure Date  . Fracture surgery left ankle  . Posterior cervical fusion/foraminotomy 05/09/2011    Procedure: POSTERIOR CERVICAL FUSION/FORAMINOTOMY LEVEL 1;  Surgeon: Hewitt Shorts;  Location: [facility];  Service: Neurosurgery;  Laterality: N/A;  C4/5 posterior arthrodesis with instrumentation  carm, Bertell Maria contacted, MRI on PACS    No family history on file.  History  Substance Use Topics  . Smoking status: Current Everyday Smoker -- 1.0 packs/day  . Smokeless tobacco: Current User  . Alcohol Use: No      Review of Systems All pertinent positives and negatives reviewed in the history of present  illness  Allergies  Midodrine hcl and Sulfonamide derivatives  Home Medications   Current Outpatient Rx  Name Route Sig Dispense Refill  . ALPRAZOLAM 1 MG PO TABS Oral Take 1 mg by mouth 3 (three) times daily.      Marland Kitchen FLUOXETINE HCL 40 MG PO CAPS Oral Take 40 mg by mouth daily.      Marland Kitchen HYDROMORPHONE HCL 2 MG PO TABS Oral Take 1-2 tablets (2-4 mg total) by mouth every 4 (four) hours as needed for pain. For neck pain 75 tablet 0  . LAMOTRIGINE 100 MG PO TABS Oral Take 150 mg by mouth at bedtime.      Marland Kitchen PANTOPRAZOLE SODIUM 40 MG PO TBEC Oral Take 40 mg by mouth 2 (two) times daily.      Marland Kitchen PROPRANOLOL HCL 40 MG PO TABS Oral Take 80 mg by mouth 2 (two) times daily.      Marland Kitchen TIZANIDINE HCL 4 MG PO TABS Oral Take 4 mg by mouth 3 (three) times daily as needed. For muscle spasms     . TRIMETHOPRIM 100 MG PO TABS Oral Take 100 mg by mouth 2 (two) times daily.        BP 110/62  Pulse 92  Temp(Src) 98 F (36.7 C) (Oral)  Resp 16  SpO2 96%  Physical Exam  Constitutional: He is oriented to person, place, and time. He appears well-developed and well-nourished. He appears distressed.  HENT:  Head: Normocephalic and atraumatic.  Eyes: Pupils are equal, round,  and reactive to light.  Cardiovascular: Normal rate, regular rhythm and normal heart sounds.   Pulmonary/Chest: Effort normal and breath sounds normal. No respiratory distress. He has no wheezes. He has no rales.  Abdominal: Soft. Bowel sounds are normal. He exhibits no distension. There is no tenderness. There is no rebound and no guarding.  Neurological: He is alert and oriented to person, place, and time.       Patient has normal deep tendon reflexes as well as coordination and strength of all 4 extremities.  Patient is ambulated about the department and to the bathroom without difficulty other than pain.  Skin: Skin is warm and dry. No rash noted.    ED Course  Procedures (including critical care time)  Labs Reviewed  CBC - Abnormal;  Notable for the following:    WBC 16.1 (*)    RBC 4.21 (*)    Hemoglobin 12.8 (*)    HCT 37.6 (*)    All other components within normal limits  DIFFERENTIAL - Abnormal; Notable for the following:    Neutrophils Relative 89 (*)    Neutro Abs 14.4 (*)    Lymphocytes Relative 6 (*)    All other components within normal limits  COMPREHENSIVE METABOLIC PANEL - Abnormal; Notable for the following:    Glucose, Bld 113 (*)    GFR calc non Af Amer 65 (*)    GFR calc Af Amer 75 (*)    All other components within normal limits  URINALYSIS, ROUTINE W REFLEX MICROSCOPIC - Abnormal; Notable for the following:    Ketones, ur 15 (*)    All other components within normal limits   Ct Head Wo Contrast  06/25/2011  *RADIOLOGY REPORT*  Clinical Data:  Arm pain.  Bilateral arm pain and numbness in lower extremity pain.  History of seven neck surgeries, with most recent 5 weeks ago.  CT HEAD WITHOUT CONTRAST CT CERVICAL SPINE WITHOUT CONTRAST  Technique:  Multidetector CT imaging of the head and cervical spine was performed following the standard protocol without intravenous contrast.  Multiplanar CT image reconstructions of the cervical spine were also generated.  Comparison:  CervicalSpine radiographs 06/04/2011.  CT cervical spine 02/05/2011  CT HEAD  Findings: Normal ventricular size.  No evidence of acute intracranial abnormality.  Specifically, no hemorrhage, hydrocephalus, mass effect, mass lesion, or evidence of acute cortically based infarction.  Polypoid mucosal thickening in the left sphenoid sinus.  Otherwise, the visualized sinuses are clear.  Visualized mastoid air cells are clear.  The skull is intact.  Soft tissues of the scalp are symmetric.  IMPRESSION:  1.  No acute intracranial abnormality. 2.  Mild sphenoid sinus disease.  CT CERVICAL SPINE  Findings: Cervical spine is normally aligned from the skull base through the cervicothoracic junction.  There are postoperative changes of the cervical spine.   Since the most recent cervical spine CT of 02/05/2011, there has been interval posterior fusion at C4-C5, with posterior rods and pedicle screws at these levels.  No evidence of hardware complication at the new post surgical site. There has also been interval removal of posterior rods and pedicle screws at C5-C7.  Preexisting anterior cervical discectomy and fusion at C3-C4 appears stable. The fusion is solid.  At the C4-5, osteolysis about the interbody graft and screws at C4 greater than C5 appears similar to the study August 2012.  Anterior cervical discectomy and fusion spanning C5-C7 appears stable.  Fusion is solid. No evidence of hardware complication or loosening.  No evidence of fracture.  Mild disc height narrowing is C2-C3, stable.  There is mild right neural foraminal narrowing at C3-4 and mild uncovertebral spurring at the C4-5 is stable.  There is mild neural foraminal narrowing on the left at C5-6.  Mild neural foraminal narrowing on the right and C6-7.  Prevertebral soft tissue contours within normal limits.  IMPRESSION:  1. Interval posterior fusion at the C4-C5, without complicating features. No acute bony abnormality identified in the cervical spine.  No significant stenosis.                           2. Osteolysis around the C4-C5 interbody fusion device and some lucency about the C4-C5 screws.  This appears unchanged compared to the CT of August 2012. 3. Solid fusion at C3-4 and C5-7 status post anterior cervical discectomy and fusion. 4.  Interval removal of posterior fusion hardware at the C5-C7 without complicating features. 5.  Mild neural foraminal narrowing at several levels as described above.  Original Report Authenticated By: Britta Mccreedy, M.D.   Ct Cervical Spine Wo Contrast  06/25/2011  *RADIOLOGY REPORT*  Clinical Data:  Arm pain.  Bilateral arm pain and numbness in lower extremity pain.  History of seven neck surgeries, with most recent 5 weeks ago.  CT HEAD WITHOUT CONTRAST CT  CERVICAL SPINE WITHOUT CONTRAST  Technique:  Multidetector CT imaging of the head and cervical spine was performed following the standard protocol without intravenous contrast.  Multiplanar CT image reconstructions of the cervical spine were also generated.  Comparison:  CervicalSpine radiographs 06/04/2011.  CT cervical spine 02/05/2011  CT HEAD  Findings: Normal ventricular size.  No evidence of acute intracranial abnormality.  Specifically, no hemorrhage, hydrocephalus, mass effect, mass lesion, or evidence of acute cortically based infarction.  Polypoid mucosal thickening in the left sphenoid sinus.  Otherwise, the visualized sinuses are clear.  Visualized mastoid air cells are clear.  The skull is intact.  Soft tissues of the scalp are symmetric.  IMPRESSION:  1.  No acute intracranial abnormality. 2.  Mild sphenoid sinus disease.  CT CERVICAL SPINE  Findings: Cervical spine is normally aligned from the skull base through the cervicothoracic junction.  There are postoperative changes of the cervical spine.  Since the most recent cervical spine CT of 02/05/2011, there has been interval posterior fusion at C4-C5, with posterior rods and pedicle screws at these levels.  No evidence of hardware complication at the new post surgical site. There has also been interval removal of posterior rods and pedicle screws at C5-C7.  Preexisting anterior cervical discectomy and fusion at C3-C4 appears stable. The fusion is solid.  At the C4-5, osteolysis about the interbody graft and screws at C4 greater than C5 appears similar to the study August 2012.  Anterior cervical discectomy and fusion spanning C5-C7 appears stable.  Fusion is solid. No evidence of hardware complication or loosening.  No evidence of fracture.  Mild disc height narrowing is C2-C3, stable.  There is mild right neural foraminal narrowing at C3-4 and mild uncovertebral spurring at the C4-5 is stable.  There is mild neural foraminal narrowing on the left at  C5-6.  Mild neural foraminal narrowing on the right and C6-7.  Prevertebral soft tissue contours within normal limits.  IMPRESSION:  1. Interval posterior fusion at the C4-C5, without complicating features. No acute bony abnormality identified in the cervical spine.  No significant stenosis.  2. Osteolysis around the C4-C5 interbody fusion device and some lucency about the C4-C5 screws.  This appears unchanged compared to the CT of August 2012. 3. Solid fusion at C3-4 and C5-7 status post anterior cervical discectomy and fusion. 4.  Interval removal of posterior fusion hardware at the C5-C7 without complicating features. 5.  Mild neural foraminal narrowing at several levels as described above.  Original Report Authenticated By: Britta Mccreedy, M.D.     Patient has no CT scan findings that are abnormal.  I think the patient is having pain related to the decreased amount of pain medicine he is taking.  He may need something for breakthrough pain so we will send him home with pain medications.  We will have him followup with his primary care Dr. for recheck.  He is also referred back to Dr. Newell Coral for further evaluation of his neck.     MDM  Pain is most likely related to his chronic pain.  Patient has been stable here in the emergency department and is feeling better following IV pain medications.  Told to return here for any worsening in his condition  MDM Reviewed: previous chart, nursing note and vitals Reviewed previous: labs, x-ray, CT scan and MRI Interpretation: labs and CT scan            Carlyle Dolly, PA-C 06/25/11 1132

## 2011-06-25 NOTE — ED Provider Notes (Signed)
Medical screening examination/treatment/procedure(s) were performed by non-physician practitioner and as supervising physician I was immediately available for consultation/collaboration.  Flint Melter, MD 06/25/11 2001

## 2011-06-25 NOTE — ED Notes (Signed)
Pt sts that 10 min after one he was sleeping and started having pain to posterior bilateral buttocks area and now arms and shoulders and sts pain is increased in right arm.  Pt had 7 neck surgeries and last surgery was 5 weeks ago by dr. Bettina Gavia.  Last pain med was 2 hours ago.  Pt is moving all around

## 2011-06-25 NOTE — ED Notes (Signed)
Regular Diet Tray Ordered 

## 2011-06-25 NOTE — ED Notes (Signed)
Meal tray ordered for pt from service response center.

## 2011-06-25 NOTE — ED Notes (Signed)
Still awaiting MD/PA assessment.  Patient/spouse kept updated.

## 2011-12-17 ENCOUNTER — Other Ambulatory Visit (HOSPITAL_COMMUNITY): Payer: Self-pay | Admitting: Urology

## 2011-12-17 DIAGNOSIS — N50819 Testicular pain, unspecified: Secondary | ICD-10-CM

## 2011-12-17 DIAGNOSIS — M545 Low back pain: Secondary | ICD-10-CM

## 2011-12-19 ENCOUNTER — Inpatient Hospital Stay (HOSPITAL_COMMUNITY): Admission: RE | Admit: 2011-12-19 | Payer: BC Managed Care – PPO | Source: Ambulatory Visit

## 2011-12-22 ENCOUNTER — Inpatient Hospital Stay (HOSPITAL_COMMUNITY): Admission: RE | Admit: 2011-12-22 | Payer: BC Managed Care – PPO | Source: Ambulatory Visit

## 2011-12-24 ENCOUNTER — Inpatient Hospital Stay (HOSPITAL_COMMUNITY): Admission: RE | Admit: 2011-12-24 | Payer: BC Managed Care – PPO | Source: Ambulatory Visit

## 2011-12-31 ENCOUNTER — Emergency Department (HOSPITAL_COMMUNITY): Payer: BC Managed Care – PPO

## 2011-12-31 ENCOUNTER — Emergency Department (HOSPITAL_COMMUNITY)
Admission: EM | Admit: 2011-12-31 | Discharge: 2011-12-31 | Disposition: A | Discharge status: Home / Self Care | Payer: BC Managed Care – PPO | Attending: Emergency Medicine | Admitting: Emergency Medicine

## 2011-12-31 ENCOUNTER — Encounter (HOSPITAL_COMMUNITY): Payer: Self-pay | Admitting: Emergency Medicine

## 2011-12-31 DIAGNOSIS — F17200 Nicotine dependence, unspecified, uncomplicated: Secondary | ICD-10-CM | POA: Insufficient documentation

## 2011-12-31 DIAGNOSIS — R42 Dizziness and giddiness: Secondary | ICD-10-CM | POA: Insufficient documentation

## 2011-12-31 DIAGNOSIS — R110 Nausea: Secondary | ICD-10-CM | POA: Insufficient documentation

## 2011-12-31 DIAGNOSIS — R5381 Other malaise: Secondary | ICD-10-CM | POA: Insufficient documentation

## 2011-12-31 DIAGNOSIS — K921 Melena: Secondary | ICD-10-CM | POA: Insufficient documentation

## 2011-12-31 DIAGNOSIS — R1012 Left upper quadrant pain: Secondary | ICD-10-CM | POA: Insufficient documentation

## 2011-12-31 DIAGNOSIS — Z79899 Other long term (current) drug therapy: Secondary | ICD-10-CM | POA: Insufficient documentation

## 2011-12-31 DIAGNOSIS — R10812 Left upper quadrant abdominal tenderness: Secondary | ICD-10-CM | POA: Insufficient documentation

## 2011-12-31 DIAGNOSIS — R109 Unspecified abdominal pain: Secondary | ICD-10-CM | POA: Insufficient documentation

## 2011-12-31 DIAGNOSIS — K625 Hemorrhage of anus and rectum: Secondary | ICD-10-CM | POA: Insufficient documentation

## 2011-12-31 LAB — CBC WITH DIFFERENTIAL/PLATELET
Basophils Absolute: 0 10*3/uL (ref 0.0–0.1)
Basophils Relative: 0 % (ref 0–1)
Eosinophils Absolute: 0.1 10*3/uL (ref 0.0–0.7)
Eosinophils Relative: 1 % (ref 0–5)
HCT: 40 % (ref 39.0–52.0)
Hemoglobin: 14 g/dL (ref 13.0–17.0)
Lymphocytes Relative: 18 % (ref 12–46)
Lymphs Abs: 1.2 10*3/uL (ref 0.7–4.0)
MCH: 31.4 pg (ref 26.0–34.0)
MCHC: 35 g/dL (ref 30.0–36.0)
MCV: 89.7 fL (ref 78.0–100.0)
Monocytes Absolute: 0.5 10*3/uL (ref 0.1–1.0)
Monocytes Relative: 7 % (ref 3–12)
Neutro Abs: 4.9 10*3/uL (ref 1.7–7.7)
Neutrophils Relative %: 73 % (ref 43–77)
Platelets: 174 10*3/uL (ref 150–400)
RBC: 4.46 MIL/uL (ref 4.22–5.81)
RDW: 12.6 % (ref 11.5–15.5)
WBC: 6.6 10*3/uL (ref 4.0–10.5)

## 2011-12-31 LAB — CBC
HCT: 38.9 % — ABNORMAL LOW (ref 39.0–52.0)
Hemoglobin: 13.5 g/dL (ref 13.0–17.0)
MCH: 31 pg (ref 26.0–34.0)
MCHC: 34.7 g/dL (ref 30.0–36.0)
MCV: 89.4 fL (ref 78.0–100.0)
Platelets: 154 10*3/uL (ref 150–400)
RBC: 4.35 MIL/uL (ref 4.22–5.81)
RDW: 12.6 % (ref 11.5–15.5)
WBC: 6.3 10*3/uL (ref 4.0–10.5)

## 2011-12-31 LAB — COMPREHENSIVE METABOLIC PANEL
ALT: 20 U/L (ref 0–53)
AST: 23 U/L (ref 0–37)
Albumin: 4.2 g/dL (ref 3.5–5.2)
Alkaline Phosphatase: 70 U/L (ref 39–117)
BUN: 11 mg/dL (ref 6–23)
CO2: 24 mEq/L (ref 19–32)
Calcium: 9.6 mg/dL (ref 8.4–10.5)
Chloride: 99 mEq/L (ref 96–112)
Creatinine, Ser: 1.1 mg/dL (ref 0.50–1.35)
GFR calc Af Amer: >90 mL/min (ref >90)
GFR calc non Af Amer: 79 mL/min — ABNORMAL LOW (ref >90)
Glucose, Bld: 93 mg/dL (ref 70–99)
Potassium: 4 mEq/L (ref 3.5–5.1)
Sodium: 135 mEq/L (ref 135–145)
Total Bilirubin: 0.4 mg/dL (ref 0.3–1.2)
Total Protein: 7 g/dL (ref 6.0–8.3)

## 2011-12-31 LAB — DIFFERENTIAL
Basophils Absolute: 0 10*3/uL (ref 0.0–0.1)
Basophils Relative: 0 % (ref 0–1)
Eosinophils Absolute: 0.1 10*3/uL (ref 0.0–0.7)
Eosinophils Relative: 1 % (ref 0–5)
Lymphocytes Relative: 29 % (ref 12–46)
Lymphs Abs: 1.8 10*3/uL (ref 0.7–4.0)
Monocytes Absolute: 0.4 10*3/uL (ref 0.1–1.0)
Monocytes Relative: 7 % (ref 3–12)
Neutro Abs: 4 10*3/uL (ref 1.7–7.7)
Neutrophils Relative %: 63 % (ref 43–77)

## 2011-12-31 LAB — POCT I-STAT, CHEM 8
BUN: 10 mg/dL (ref 6–23)
Calcium, Ion: 1.15 mmol/L (ref 1.12–1.23)
Chloride: 103 mEq/L (ref 96–112)
Creatinine, Ser: 1.3 mg/dL (ref 0.50–1.35)
Glucose, Bld: 87 mg/dL (ref 70–99)
HCT: 40 % (ref 39.0–52.0)
Hemoglobin: 13.6 g/dL (ref 13.0–17.0)
Potassium: 4.5 mEq/L (ref 3.5–5.1)
Sodium: 138 mEq/L (ref 135–145)
TCO2: 25 mmol/L (ref 0–100)

## 2011-12-31 LAB — PROTIME-INR
INR: 1 (ref 0.00–1.49)
Prothrombin Time: 13.4 seconds (ref 11.6–15.2)

## 2011-12-31 LAB — CARDIAC PANEL(CRET KIN+CKTOT+MB+TROPI)
CK, MB: 2 ng/mL (ref 0.3–4.0)
Relative Index: INVALID (ref 0.0–2.5)
Total CK: 88 U/L (ref 7–232)
Troponin I: <0.3 ng/mL (ref <0.30)

## 2011-12-31 LAB — TYPE AND SCREEN
ABO/RH(D): O POS
Antibody Screen: POSITIVE

## 2011-12-31 LAB — LIPASE, BLOOD: Lipase: 25 U/L (ref 11–59)

## 2011-12-31 LAB — LACTIC ACID, PLASMA: Lactic Acid, Venous: 0.6 mmol/L (ref 0.5–2.2)

## 2011-12-31 LAB — ABO/RH: ABO/RH(D): O POS

## 2011-12-31 MED ORDER — SODIUM CHLORIDE 0.9 % IV BOLUS (SEPSIS): 1000.0000 mL | Freq: Once | INTRAVENOUS | Status: DC

## 2011-12-31 MED ORDER — SODIUM CHLORIDE 0.9 % IV BOLUS (SEPSIS)
1000.0000 mL | Freq: Once | INTRAVENOUS | Status: AC
  Administered 2011-12-31: 1000 mL via INTRAVENOUS

## 2011-12-31 MED ORDER — IOHEXOL 300 MG/ML  SOLN
80.0000 mL | Freq: Once | INTRAMUSCULAR | Status: AC | PRN
  Administered 2011-12-31: 80 mL via INTRAVENOUS

## 2011-12-31 MED ORDER — PANTOPRAZOLE SODIUM 40 MG IV SOLR
40.0000 mg | Freq: Once | INTRAVENOUS | Status: AC
  Administered 2011-12-31: 40 mg via INTRAVENOUS
  Filled 2011-12-31: qty 40

## 2011-12-31 MED ORDER — ONDANSETRON HCL 4 MG/2ML IJ SOLN
4.0000 mg | Freq: Once | INTRAMUSCULAR | Status: AC
  Administered 2011-12-31: 4 mg via INTRAVENOUS
  Filled 2011-12-31: qty 2

## 2011-12-31 NOTE — ED Notes (Signed)
Pt c/o left abdominal pain that started last night. The pt reports bright red blood in stool this am.

## 2011-12-31 NOTE — ED Provider Notes (Addendum)
History     CSN: [id number]  Arrival date & time 12/31/11  1329   First MD Initiated Contact with Patient 12/31/11 1459      No chief complaint on file.   (Consider location/radiation/quality/duration/timing/severity/associated sxs/prior treatment) HPI Comments: Patient reports burning left quadrant pain for the past 4 months. This morning he had several episodes of bright red blood in his stool which worried him so he went to his doctor and was sent to the ED. He reports he had a bowel movement next with bright red blood today and 4 other episodes of passing bright red blood per rectum it was painless. The toilet water turned red. Red blood with wiping. He denies any melena as any vomiting, nausea, fever chills. Has a history of chronic neck pain and admits to using a lot of ibuprofen. denies any alcohol use. No chest pain, shortness of breath, nausea. Some lightheadedness and dizziness with standing.  The history is provided by the patient and the spouse.    Past Medical History  Diagnosis Date  . Headache pt takes inderal for headaches  . Arthritis   . Fibromyalgia   . Depression     Past Surgical History  Procedure Date  . Fracture surgery left ankle  . Posterior cervical fusion/foraminotomy 05/09/2011    Procedure: POSTERIOR CERVICAL FUSION/FORAMINOTOMY LEVEL 1;  Surgeon: Hewitt Shorts;  Location: [facility];  Service: Neurosurgery;  Laterality: N/A;  C4/5 posterior arthrodesis with instrumentation  carm, Bertell Maria contacted, MRI on PACS    No family history on file.  History  Substance Use Topics  . Smoking status: Current Everyday Smoker -- 1.0 packs/day  . Smokeless tobacco: Current User  . Alcohol Use: No      Review of Systems  Constitutional: Positive for fatigue. Negative for fever, activity change and appetite change.  HENT: Negative for congestion.   Respiratory: Negative for cough, chest tightness and shortness of breath.   Cardiovascular:  Negative for chest pain.  Gastrointestinal: Positive for nausea, abdominal pain, blood in stool and anal bleeding. Negative for vomiting, diarrhea and rectal pain.  Genitourinary: Negative for dysuria and hematuria.  Musculoskeletal: Negative for back pain.  Skin: Negative for rash.  Neurological: Positive for dizziness, weakness and light-headedness.    Allergies  Midodrine hcl and Sulfonamide derivatives  Home Medications   Current Outpatient Rx  Name Route Sig Dispense Refill  . BUSPIRONE HCL 10 MG PO TABS Oral Take 10 mg by mouth 2 (two) times daily.    . DEXLANSOPRAZOLE 60 MG PO CPDR Oral Take 60 mg by mouth daily.    Marland Kitchen FLUOXETINE HCL 20 MG PO CAPS Oral Take 60 mg by mouth daily.    Marland Kitchen LAMOTRIGINE 100 MG PO TABS Oral Take 100 mg by mouth 2 (two) times daily.      BP 105/82  Pulse 99  Temp 98.1 F (36.7 C) (Oral)  Resp 20  SpO2 100%  Physical Exam  Constitutional: He is oriented to person, place, and time. He appears well-developed and well-nourished. No distress.  HENT:  Head: Normocephalic and atraumatic.  Mouth/Throat: Oropharynx is clear and moist. No oropharyngeal exudate.  Eyes: Conjunctivae are normal. Pupils are equal, round, and reactive to light.  Neck: Normal range of motion. Neck supple.  Cardiovascular: Normal rate, regular rhythm and normal heart sounds.   Pulmonary/Chest: Effort normal and breath sounds normal. No respiratory distress.  Abdominal: Soft. There is tenderness. There is no rebound and no guarding.  TTP LUQ without guarding  Genitourinary:       No testicular tenderness. No hemorrhoids, no fissures No stool in vault  Musculoskeletal: Normal range of motion. He exhibits no edema and no tenderness.  Neurological: He is alert and oriented to person, place, and time. No cranial nerve deficit.  Skin: Skin is warm.    ED Course  Procedures (including critical care time)  Labs Reviewed  COMPREHENSIVE METABOLIC PANEL - Abnormal; Notable  for the following:    GFR calc non Af Amer 79 (*)     All other components within normal limits  PROTIME-INR  TYPE AND SCREEN  LIPASE, BLOOD  LACTIC ACID, PLASMA  CARDIAC PANEL(CRET KIN+CKTOT+MB+TROPI)  CBC WITH DIFFERENTIAL  ABO/RH  CBC  DIFFERENTIAL   Ct Abdomen Pelvis W Contrast  12/31/2011  *RADIOLOGY REPORT*  Clinical Data: Left upper quadrant pain with rectal bleeding.  CT ABDOMEN AND PELVIS WITH CONTRAST  Technique:  Multidetector CT imaging of the abdomen and pelvis was performed using the standard protocol during bolus administration of intravenous contrast.  Contrast: 80mL OMNIPAQUE IOHEXOL 300 MG/ML  SOLN  Comparison:   None.  Findings:  Liver, spleen, pancreas, adrenal glands, and kidneys normal.  Gallbladder unremarkable by CT.  No biliary ductal dilation.  Stomach and visualized large and small bowel unremarkable.  Abdominal aorta normal in caliber.  No significant lymphadenopathy.  No free fluid.  Visualized lung bases clear.  Appendix not visualized but probably normal.  Visualized colon and small bowel unremarkable.  No free fluid. Prostate and seminal vesicles normal for age.  No significant lymphadenopathy. Urinary bladder normal.  IMPRESSION: Normal CT of the abdomen and pelvis.  Original Report Authenticated By: Elsie Stain, M.D.     No diagnosis found.    MDM  Acute and chronic upper quadrant pain with new rectal bleeding. Vitals stable, no distress.  Hemoglobin stable, CT scan negative for acute pathology. Vital signs stable without tachycardia or hypotension.  Patient denies any dizziness or lightheadedness with standing. His blood pressure remained stable the heart rate elevated slightly with standing.  Otherwise healthy male on no anticoagulated she presented bright red blood per rectum, stable hemoglobin, stable vitals, stable for outpatient followup.   Date: 12/31/2011  Rate: 80  Rhythm: normal sinus rhythm  QRS Axis: normal  Intervals: normal  ST/T  Wave abnormalities: normal  Conduction Disutrbances:none  Narrative Interpretation:   Old EKG Reviewed: unchanged        Glynn Octave, MD 12/31/11 2124  Glynn Octave, MD 01/22/12 Rickey Primus

## 2012-01-01 LAB — OCCULT BLOOD, POC DEVICE: Fecal Occult Bld: POSITIVE

## 2012-01-02 ENCOUNTER — Telehealth: Payer: Self-pay | Admitting: Gastroenterology

## 2012-01-03 NOTE — Telephone Encounter (Signed)
Pt scheduled to see Dr. Arlyce Dice 01/08/12@8 :45am. Pt aware of appt date and time.

## 2012-01-08 ENCOUNTER — Ambulatory Visit (INDEPENDENT_AMBULATORY_CARE_PROVIDER_SITE_OTHER): Payer: BC Managed Care – PPO | Admitting: Gastroenterology

## 2012-01-08 ENCOUNTER — Encounter: Payer: Self-pay | Admitting: Gastroenterology

## 2012-01-08 VITALS — BP 118/82 | HR 80 | Ht 69.0 in | Wt 170.0 lb

## 2012-01-08 DIAGNOSIS — K625 Hemorrhage of anus and rectum: Secondary | ICD-10-CM

## 2012-01-08 DIAGNOSIS — R1310 Dysphagia, unspecified: Secondary | ICD-10-CM

## 2012-01-08 DIAGNOSIS — R1013 Epigastric pain: Secondary | ICD-10-CM

## 2012-01-08 MED ORDER — PEG-KCL-NACL-NASULF-NA ASC-C 100 G PO SOLR: 1.0000 | Freq: Once | ORAL | Status: DC

## 2012-01-08 NOTE — Assessment & Plan Note (Signed)
Symptoms could be do to a peptic stricture. Extrinsic compression related to his cervical spine surgery is also a possibility.  Recommendations #1 upper

## 2012-01-08 NOTE — Patient Instructions (Addendum)
Upper GI Endoscopy Upper GI endoscopy means using a flexible scope to look at the esophagus, stomach, and upper small bowel. This is done to make a diagnosis in people with heartburn, abdominal pain, or abnormal bleeding. Sometimes an endoscope is needed to remove foreign bodies or food that become stuck in the esophagus; it can also be used to take biopsy samples. For the best results, do not eat or drink for 8 hours before having your upper endoscopy.  To perform the endoscopy, you will probably be sedated and your throat will be numbed with a special spray. The endoscope is then slowly passed down your throat (this will not interfere with your breathing). An endoscopy exam takes 15 to 30 minutes to complete and there is no real pain. Patients rarely remember much about the procedure. The results of the test may take several days if a biopsy or other test is taken.  You may have a sore throat after an endoscopy exam. Serious complications are very rare. Stick to liquids and soft foods until your pain is better. Do not drive a car or operate any dangerous equipment for at least 24 hours after being sedated. SEEK IMMEDIATE MEDICAL CARE IF:   You have severe throat pain.   You have shortness of breath.   You have bleeding problems.   You have a fever.   You have difficulty recovering from your sedation.  Document Released: 07/18/2004 Document Revised: 05/30/2011 Document Reviewed: 06/12/2008 [facility] Patient Information 2012 [place], [place].   Colonoscopy A colonoscopy is an exam to evaluate your entire colon. In this exam, your colon is cleansed. A long fiberoptic tube is inserted through your rectum and into your colon. The fiberoptic scope (endoscope) is a long bundle of enclosed and very flexible fibers. These fibers transmit light to the area examined and send images from that area to your caregiver. Discomfort is usually minimal. You may be given a drug to help you sleep (sedative) during or  prior to the procedure. This exam helps to detect lumps (tumors), polyps, inflammation, and areas of bleeding. Your caregiver may also take a small piece of tissue (biopsy) that will be examined under a microscope. LET YOUR CAREGIVER KNOW ABOUT:   Allergies to food or medicine.   Medicines taken, including vitamins, herbs, eyedrops, over-the-counter medicines, and creams.   Use of steroids (by mouth or creams).   Previous problems with anesthetics or numbing medicines.   History of bleeding problems or blood clots.   Previous surgery.   Other health problems, including diabetes and kidney problems.   Possibility of pregnancy, if this applies.  BEFORE THE PROCEDURE   A clear liquid diet may be required for 2 days before the exam.   Ask your caregiver about changing or stopping your regular medications.   Liquid injections (enemas) or laxatives may be required.   A large amount of electrolyte solution may be given to you to drink over a short period of time. This solution is used to clean out your colon.   You should be present 60 minutes prior to your procedure or as directed by your caregiver.  AFTER THE PROCEDURE   If you received a sedative or pain relieving medication, you will need to arrange for someone to drive you home.   Occasionally, there is a little blood passed with the first bowel movement. Do not be concerned.  FINDING OUT THE RESULTS OF YOUR TEST Not all test results are available during your visit. If your test  results are not back during the visit, make an appointment with your caregiver to find out the results. Do not assume everything is normal if you have not heard from your caregiver or the medical facility. It is important for you to follow up on all of your test results. HOME CARE INSTRUCTIONS   It is not unusual to pass moderate amounts of gas and experience mild abdominal cramping following the procedure. This is due to air being used to inflate your  colon during the exam. Walking or a warm pack on your belly (abdomen) may help.   You may resume all normal meals and activities after sedatives and medicines have worn off.   Only take over-the-counter or prescription medicines for pain, discomfort, or fever as directed by your caregiver. Do not use aspirin or blood thinners if a biopsy was taken. Consult your caregiver for medicine usage if biopsies were taken.  SEEK IMMEDIATE MEDICAL CARE IF:   You have a fever.   You pass large blood clots or fill a toilet with blood following the procedure. This may also occur 10 to 14 days following the procedure. This is more likely if a biopsy was taken.   You develop abdominal pain that keeps getting worse and cannot be relieved with medicine.  Document Released: 06/07/2000 Document Revised: 05/30/2011 Document Reviewed: 01/21/2008 [facility] Patient Information 2012 [place], [place].

## 2012-01-08 NOTE — Assessment & Plan Note (Signed)
Pain is probably related to NSAID use. Ulcer or nonulcer dyspepsia are possibilities.  Recommendations #1 hold NSAIDs #2 continue dexilant #3 upper endoscopy

## 2012-01-08 NOTE — Assessment & Plan Note (Signed)
Hematochezia in the face of a normal hemoglobin and BUN suggests a lower GI bleeding source. With his NSAID use he may be bleeding from ulcers in the colon. Polyps, AVMs or neoplasm are other possibilities.  Recommendations #1 colonoscopy

## 2012-01-08 NOTE — Progress Notes (Signed)
History of Present Illness: This 46 year old white male referred at the request of Dr. Christell Constant and the ED for evaluation of rectal bleeding. Approximately a week ago he was seen at [facility] ED for evaluation of  rectal bleeding.  He awakened with a burning midabdominal pain. This was followed by several episodes of bright red blood per rectum. Blood initially was mixed with the stools and then was followed by bowel movements of blood only. He was seen in the ER where CT scan was negative.  Hemoglobin was 13 and BUN normal. He has had no further bleeding. He also complains of moderately severe upper abdominal pain with burning and radiation into his chest. He has dysphagia to solids. He takes [facility] or Advil daily, sometimes several times a day. He is also on dexilant.  He underwent cervical fusion in November, 2012.    Past Medical History  Diagnosis Date  . Headache pt takes inderal for headaches  . Arthritis   . Fibromyalgia   . Depression    Past Surgical History  Procedure Date  . Fracture surgery left ankle  . Posterior cervical fusion/foraminotomy 05/09/2011    Procedure: POSTERIOR CERVICAL FUSION/FORAMINOTOMY LEVEL 1;  Surgeon: Hewitt Shorts;  Location: [facility];  Service: Neurosurgery;  Laterality: N/A;  C4/5 posterior arthrodesis with instrumentation   . Cystectomy    family history includes Diabetes in his mother. Current Outpatient Prescriptions  Medication Sig Dispense Refill  . busPIRone (BUSPAR) 10 MG tablet Take 10 mg by mouth 2 (two) times daily.      Marland Kitchen dexlansoprazole (DEXILANT) 60 MG capsule Take 60 mg by mouth daily.      Marland Kitchen FLUoxetine (PROZAC) 20 MG capsule Take 60 mg by mouth daily.      Marland Kitchen lamoTRIgine (LAMICTAL) 100 MG tablet Take 100 mg by mouth 2 (two) times daily.       Allergies as of 01/08/2012 - Review Complete 01/08/2012  Allergen Reaction Noted  . Midodrine hcl Swelling   . Sulfonamide derivatives      reports that he has been smoking Cigarettes.  He  has been smoking about 1 pack per day. He has quit using smokeless tobacco. His smokeless tobacco use included Chew. He reports that he does not drink alcohol or use illicit drugs.     Review of Systems: He complains of testicular pain that radiates into his groin. He's been seen by urology for this problem. Pertinent positive and negative review of systems were noted in the above HPI section. All other review of systems were otherwise negative.  Vital signs were reviewed in today's medical record Physical Exam: General: Well developed , well nourished, no acute distress Head: Normocephalic and atraumatic Eyes:  sclerae anicteric, EOMI Ears: Normal auditory acuity Mouth: No deformity or lesions Neck: Supple, no masses or thyromegaly Lungs: Clear throughout to auscultation Heart: Regular rate and rhythm; no murmurs, rubs or bruits Abdomen: Soft, non tender and non distended. No masses, hepatosplenomegaly or hernias noted. Normal Bowel sounds Rectal:deferred Musculoskeletal: Symmetrical with no gross deformities  Skin: No lesions on visible extremities Pulses:  Normal pulses noted Extremities: No clubbing, cyanosis, edema or deformities noted Neurological: Alert oriented x 4, grossly nonfocal Cervical Nodes:  No significant cervical adenopathy Inguinal Nodes: No significant inguinal adenopathy Psychological:  Alert and cooperative. Normal mood and affect

## 2012-01-09 ENCOUNTER — Encounter: Payer: Self-pay | Admitting: Gastroenterology

## 2012-01-28 ENCOUNTER — Encounter: Payer: BC Managed Care – PPO | Admitting: Gastroenterology

## 2012-01-28 ENCOUNTER — Ambulatory Visit (AMBULATORY_SURGERY_CENTER): Payer: BC Managed Care – PPO | Admitting: Gastroenterology

## 2012-01-28 ENCOUNTER — Encounter: Payer: Self-pay | Admitting: Gastroenterology

## 2012-01-28 VITALS — BP 140/76 | HR 71 | Temp 96.5°F | Resp 16 | Ht 69.0 in | Wt 170.0 lb

## 2012-01-28 DIAGNOSIS — R1310 Dysphagia, unspecified: Secondary | ICD-10-CM

## 2012-01-28 DIAGNOSIS — R1013 Epigastric pain: Secondary | ICD-10-CM

## 2012-01-28 MED ORDER — DEXLANSOPRAZOLE 60 MG PO CPDR: 60.0000 mg | DELAYED_RELEASE_CAPSULE | Freq: Two times a day (BID) | ORAL | Status: DC

## 2012-01-28 MED ORDER — SODIUM CHLORIDE 0.9 % IV SOLN: 500.0000 mL | INTRAVENOUS | Status: DC

## 2012-01-28 NOTE — Op Note (Signed)
 [facility] [address] [place], [place]  [address]  ENDOSCOPY PROCEDURE REPORT  PATIENT:  Richard, Franco  MR#:  [id number] BIRTHDATE:  1966-06-10, 46 yrs. old  GENDER:  male  ENDOSCOPIST:  Barbette Hair. Arlyce Dice, MD Referred by:  Rudi Heap, M.D.  PROCEDURE DATE:  01/28/2012 PROCEDURE:  EGD, diagnostic 43235, Maloney Dilation of Esophagus ASA CLASS:  Class II INDICATIONS:  abdominal pain, dysphagia  MEDICATIONS:   MAC sedation, administered by CRNA propofol 150mg IV, glycopyrrolate (Robinal) 0.2 mg IV, 0.6cc simethancone 0.6 cc PO TOPICAL ANESTHETIC:  Exactacain Spray  DESCRIPTION OF PROCEDURE:   After the risks and benefits of the procedure were explained, informed consent was obtained.  The LB GIF-H180 [id number] endoscope was introduced through the mouth and advanced to the third portion of the duodenum.  The instrument was slowly withdrawn as the mucosa was fully examined. <<PROCEDUREIMAGES>>  Prominent folds were noted. Diffusely enlarged but soft folds in fundus (see image4).  Otherwise the examination was normal. Dilation with maloney dilator 18mm Dysphagia but without obvious stricture (see image2, image3, and image5). Mild resistance; no heme    Retroflexed views revealed no abnormalities.    The scope was then withdrawn from the patient and the procedure completed.  COMPLICATIONS:  None  ENDOSCOPIC IMPRESSION: 1) Prominent gastric folds 2) Otherwise normal examination - s/p maloney dilitation for dysphagia RECOMMENDATIONS: 1) continue PPI 2) Avoid NSAIDS  ______________________________ Barbette Hair. Arlyce Dice, MD  CC:  n. eSIGNED:   Barbette Hair. Kaplan at 01/28/2012 04:27 PM  Karlyn Agee, [phone]

## 2012-01-28 NOTE — Patient Instructions (Addendum)
YOU HAD AN ENDOSCOPIC PROCEDURE TODAY AT THE [facility]: Refer to the procedure report that was given to you for any specific questions about what was found during the examination.  If the procedure report does not answer your questions, please call your gastroenterologist to clarify.  If you requested that your care partner not be given the details of your procedure findings, then the procedure report has been included in a sealed envelope for you to review at your convenience later.  YOU SHOULD EXPECT: Some feelings of bloating in the abdomen. Passage of more gas than usual.  Walking can help get rid of the air that was put into your GI tract during the procedure and reduce the bloating. If you had a lower endoscopy (such as a colonoscopy or flexible sigmoidoscopy) you may notice spotting of blood in your stool or on the toilet paper. If you underwent a bowel prep for your procedure, then you may not have a normal bowel movement for a few days.  DIET: FOLLOW DILATION DIET.  ACTIVITY: Your care partner should take you home directly after the procedure.  You should plan to take it easy, moving slowly for the rest of the day.  You can resume normal activity the day after the procedure however you should NOT DRIVE or use heavy machinery for 24 hours (because of the sedation medicines used during the test).    SYMPTOMS TO REPORT IMMEDIATELY: A gastroenterologist can be reached at any hour.  During normal business hours, 8:30 AM to 5:00 PM Monday through Friday, call [phone].  After hours and on weekends, please call the GI answering service at [phone] who will take a message and have the physician on call contact you.    Following upper endoscopy (EGD)  Vomiting of blood or coffee ground material  New chest pain or pain under the shoulder blades  Painful or persistently difficult swallowing  New shortness of breath  Fever of 100F or higher   Black, tarry-looking  stools  FOLLOW UP: If any biopsies were taken you will be contacted by phone or by letter within the next 1-3 weeks.  Call your gastroenterologist if you have not heard about the biopsies in 3 weeks.  Our staff will call the home number listed on your records the next business day following your procedure to check on you and address any questions or concerns that you may have at that time regarding the information given to you following your procedure. This is a courtesy call and so if there is no answer at the home number and we have not heard from you through the emergency physician on call, we will assume that you have returned to your regular daily activities without incident.  SIGNATURES/CONFIDENTIALITY: You and/or your care partner have signed paperwork which will be entered into your electronic medical record.  These signatures attest to the fact that that the information above on your After Visit Summary has been reviewed and is understood.  Full responsibility of the confidentiality of this discharge information lies with you and/or your care-partner.   AVOID NSAID. Resume all other medications.

## 2012-01-28 NOTE — Progress Notes (Signed)
Patient did not experience any of the following events: a burn prior to discharge; a fall within the facility; wrong site/side/patient/procedure/implant event; or a hospital transfer or hospital admission upon discharge from the facility. (G8907) Patient did not have preoperative order for IV antibiotic SSI prophylaxis. (G8918)  

## 2012-01-29 ENCOUNTER — Telehealth: Payer: Self-pay | Admitting: *Deleted

## 2012-01-29 NOTE — Telephone Encounter (Signed)
  Follow up Call-  Call back number 01/28/2012  Post procedure Call Back phone  # [phone]  Permission to leave phone message Yes     Patient questions:  Do you have a fever, pain , or abdominal swelling? no Pain Score  0 *  Have you tolerated food without any problems? yes  Have you been able to return to your normal activities? yes  Do you have any questions about your discharge instructions: Diet   no Medications  no Follow up visit  no  Do you have questions or concerns about your Care? no  Actions: * If pain score is 4 or above: No action needed, pain <4.

## 2012-02-18 ENCOUNTER — Encounter: Payer: Self-pay | Admitting: Gastroenterology

## 2012-02-18 ENCOUNTER — Ambulatory Visit (AMBULATORY_SURGERY_CENTER): Payer: BC Managed Care – PPO | Admitting: Gastroenterology

## 2012-02-18 VITALS — BP 121/78 | HR 88 | Temp 96.9°F | Resp 18 | Ht 69.0 in | Wt 170.0 lb

## 2012-02-18 DIAGNOSIS — F17200 Nicotine dependence, unspecified, uncomplicated: Secondary | ICD-10-CM

## 2012-02-18 DIAGNOSIS — Z87891 Personal history of nicotine dependence: Secondary | ICD-10-CM | POA: Insufficient documentation

## 2012-02-18 DIAGNOSIS — Z8669 Personal history of other diseases of the nervous system and sense organs: Secondary | ICD-10-CM | POA: Insufficient documentation

## 2012-02-18 DIAGNOSIS — G8929 Other chronic pain: Secondary | ICD-10-CM | POA: Insufficient documentation

## 2012-02-18 DIAGNOSIS — M797 Fibromyalgia: Secondary | ICD-10-CM | POA: Insufficient documentation

## 2012-02-18 DIAGNOSIS — R1013 Epigastric pain: Secondary | ICD-10-CM

## 2012-02-18 DIAGNOSIS — D129 Benign neoplasm of anus and anal canal: Secondary | ICD-10-CM

## 2012-02-18 DIAGNOSIS — K6289 Other specified diseases of anus and rectum: Secondary | ICD-10-CM

## 2012-02-18 DIAGNOSIS — D128 Benign neoplasm of rectum: Secondary | ICD-10-CM

## 2012-02-18 DIAGNOSIS — K625 Hemorrhage of anus and rectum: Secondary | ICD-10-CM

## 2012-02-18 MED ORDER — SODIUM CHLORIDE 0.9 % IV SOLN: 500.0000 mL | INTRAVENOUS | Status: DC

## 2012-02-18 NOTE — Patient Instructions (Addendum)
YOU HAD AN ENDOSCOPIC PROCEDURE TODAY AT THE [facility]: Refer to the procedure report that was given to you for any specific questions about what was found during the examination.  If the procedure report does not answer your questions, please call your gastroenterologist to clarify.  If you requested that your care partner not be given the details of your procedure findings, then the procedure report has been included in a sealed envelope for you to review at your convenience later.  YOU SHOULD EXPECT: Some feelings of bloating in the abdomen. Passage of more gas than usual.  Walking can help get rid of the air that was put into your GI tract during the procedure and reduce the bloating. If you had a lower endoscopy (such as a colonoscopy or flexible sigmoidoscopy) you may notice spotting of blood in your stool or on the toilet paper. If you underwent a bowel prep for your procedure, then you may not have a normal bowel movement for a few days.  DIET: Your first meal following the procedure should be a light meal and then it is ok to progress to your normal diet.  A half-sandwich or bowl of soup is an example of a good first meal.  Heavy or fried foods are harder to digest and may make you feel nauseous or bloated.  Likewise meals heavy in dairy and vegetables can cause extra gas to form and this can also increase the bloating.  Drink plenty of fluids but you should avoid alcoholic beverages for 24 hours.  ACTIVITY: Your care partner should take you home directly after the procedure.  You should plan to take it easy, moving slowly for the rest of the day.  You can resume normal activity the day after the procedure however you should NOT DRIVE or use heavy machinery for 24 hours (because of the sedation medicines used during the test).    SYMPTOMS TO REPORT IMMEDIATELY: A gastroenterologist can be reached at any hour.  During normal business hours, 8:30 AM to 5:00 PM Monday through Friday,  call [phone].  After hours and on weekends, please call the GI answering service at [phone] who will take a message and have the physician on call contact you.   Following lower endoscopy (colonoscopy or flexible sigmoidoscopy):  Excessive amounts of blood in the stool  Significant tenderness or worsening of abdominal pains  Swelling of the abdomen that is new, acute  Fever of 100F or higher   FOLLOW UP: If any biopsies were taken you will be contacted by phone or by letter within the next 1-3 weeks.  Call your gastroenterologist if you have not heard about the biopsies in 3 weeks.  Our staff will call the home number listed on your records the next business day following your procedure to check on you and address any questions or concerns that you may have at that time regarding the information given to you following your procedure. This is a courtesy call and so if there is no answer at the home number and we have not heard from you through the emergency physician on call, we will assume that you have returned to your regular daily activities without incident.  SIGNATURES/CONFIDENTIALITY:  Please call tomorrow to set up a return office visit for 2 weeks  Resume your normal medications  Await biopsy results You and/or your care partner have signed paperwork which will be entered into your electronic medical record.  These signatures attest to the fact  that that the information above on your After Visit Summary has been reviewed and is understood.  Full responsibility of the confidentiality of this discharge information lies with you and/or your care-partner.

## 2012-02-18 NOTE — Progress Notes (Signed)
Patient did not have preoperative order for IV antibiotic SSI prophylaxis. (G8918)  Patient did not experience any of the following events: a burn prior to discharge; a fall within the facility; wrong site/side/patient/procedure/implant event; or a hospital transfer or hospital admission upon discharge from the facility. (G8907)  

## 2012-02-18 NOTE — Op Note (Signed)
[facility] [address] [place], [address]   COLONOSCOPY PROCEDURE REPORT  PATIENT: [name], [name]  MR#: [id number] BIRTHDATE: [birth year] , 46  yrs. old GENDER: Male ENDOSCOPIST: Louis Meckel, MD REFERRED NG:EXBMWU Christell Constant, M.D. PROCEDURE DATE:  02/18/2012 PROCEDURE:   Colonoscopy with biopsy ASA CLASS:   Class II INDICATIONS:rectal bleeding. MEDICATIONS: MAC sedation, administered by CRNA and Propofol (Diprivan) 320 mg IV  DESCRIPTION OF PROCEDURE:   After the risks benefits and alternatives of the procedure were thoroughly explained, informed consent was obtained.  A digital rectal exam revealed no abnormalities of the rectum.   The LB CF-H180AL [id number]  endoscope was introduced through the anus and advanced to the cecum, which was identified by both the appendix and ileocecal valve. No adverse events experienced.   The quality of the prep was excellent, using MoviPrep  The instrument was then slowly withdrawn as the colon was fully examined.      COLON FINDINGS: Abnormal mucosa was found in the rectosigmoid colon. Mildly erythematous, edematous appearing mucosa extending from rectum to very distal sigmoid at 7-8cm from anal verge.  Multiple biopsies were performed using cold forceps.   The colon mucosa was otherwise normal.  Retroflexed views revealed no abnormalities. The time to cecum=1 minutes 52 seconds  Withdrawal time=9 minutes 11 seconds.  The scope was withdrawn and the procedure completed. COMPLICATIONS: There were no complications.  ENDOSCOPIC IMPRESSION: 1.  Possibly  abnormal mucosa was found in the rectosigmoid colon raising question of proctosigmoiditis; multiple biopsies were performed using cold forceps 2.   The colon mucosa was otherwise normal  RECOMMENDATIONS: 1.  Await biopsy results 2.  OV  2 weeks   eSigned:  Louis Meckel, MD 02/18/2012 12:17 PM   cc:   PATIENT NAME:  Richard Franco, Richard Franco MR#: [id number]

## 2012-02-19 ENCOUNTER — Telehealth: Payer: Self-pay | Admitting: *Deleted

## 2012-02-19 NOTE — Telephone Encounter (Signed)
  Follow up Call-  Call back number 02/18/2012 01/28/2012  Post procedure Call Back phone  # [phone]  Permission to leave phone message Yes Yes     Patient questions:  Do you have a fever, pain , or abdominal swelling? no Pain Score  0 *  Have you tolerated food without any problems? yes  Have you been able to return to your normal activities? yes  Do you have any questions about your discharge instructions: Diet   no Medications  no Follow up visit  no  Do you have questions or concerns about your Care? no  Actions: * If pain score is 4 or above: No action needed, pain <4.

## 2012-02-26 NOTE — Progress Notes (Signed)
Quick Note:  Please inform the patient that lab work were normal. Needs f/u OV ______

## 2012-03-02 ENCOUNTER — Other Ambulatory Visit: Payer: Self-pay | Admitting: Neurosurgery

## 2012-03-02 DIAGNOSIS — M542 Cervicalgia: Secondary | ICD-10-CM

## 2012-03-03 ENCOUNTER — Telehealth: Payer: Self-pay | Admitting: *Deleted

## 2012-03-03 NOTE — Telephone Encounter (Signed)
LMOM re: labs and bxs were normal  Pt has f/u appt on 03-19-12 at 2:45 and told to keep that appt.

## 2012-03-04 ENCOUNTER — Ambulatory Visit
Admission: RE | Admit: 2012-03-04 | Discharge: 2012-03-04 | Disposition: A | Discharge status: Home / Self Care | Payer: BC Managed Care – PPO | Source: Ambulatory Visit | Attending: Neurosurgery | Admitting: Neurosurgery

## 2012-03-04 DIAGNOSIS — M542 Cervicalgia: Secondary | ICD-10-CM

## 2012-03-04 MED ORDER — IOHEXOL 300 MG/ML  SOLN
1.0000 mL | Freq: Once | INTRAMUSCULAR | Status: AC | PRN
  Administered 2012-03-04: 1 mL via EPIDURAL

## 2012-03-19 ENCOUNTER — Ambulatory Visit (INDEPENDENT_AMBULATORY_CARE_PROVIDER_SITE_OTHER): Payer: BC Managed Care – PPO | Admitting: Gastroenterology

## 2012-03-19 ENCOUNTER — Encounter: Payer: Self-pay | Admitting: Gastroenterology

## 2012-03-19 VITALS — BP 100/68 | HR 104 | Ht 68.0 in | Wt 171.5 lb

## 2012-03-19 DIAGNOSIS — K625 Hemorrhage of anus and rectum: Secondary | ICD-10-CM

## 2012-03-19 DIAGNOSIS — R1310 Dysphagia, unspecified: Secondary | ICD-10-CM

## 2012-03-19 DIAGNOSIS — R079 Chest pain, unspecified: Secondary | ICD-10-CM

## 2012-03-19 NOTE — Assessment & Plan Note (Signed)
No etiology for rectal bleeding was determined by colonoscopy. He's had no recurrences. Bleeding perhaps may be do to hemorrhoids.

## 2012-03-19 NOTE — Assessment & Plan Note (Addendum)
No stricture seen at endoscopy.   Patient reports improvement with dilatation therapy.

## 2012-03-19 NOTE — Assessment & Plan Note (Addendum)
Etiology for chest pain is not certain. I see constrictive pericarditis listed as a problem but I can find no documentation for this. Prior echocardiogram appeared normal. There are no acute changes on today's EKG. Patient has had similar complaints with negative workup in the past. There may be a significant component of anxiety.  Recommendations #1 refer back to cardiology for reevaluation. I discussed the case with Dr. Freida Busman who agreed that, while cardiology followup is warranted, acute ischemia and pericarditis are doubtful and that no immediate measures need to be undertaken.

## 2012-03-19 NOTE — Progress Notes (Signed)
History of Present Illness:  [name] has returned following upper and lower endoscopy. A distal esophageal stricture was dilated. Mild gastritis was seen. Colonoscopy was entirely normal. Dysphagia is gone. He's had no recurrent episodes of rectal bleeding. Over the past 6-7 days he's been complaining  of some heaviness in his chest and tightness in the jaw that is nonexertional. He claims he is aware of his breathing although isn't dyspneic, per se. Pyrosis is well controlled.  In March, 2011 he was hospitalized with chest pain. Ischemia was ruled out. Outpatient echocardiogram was normal.   Past Medical History  Diagnosis Date  . Headache pt takes inderal for headaches  . Arthritis   . Fibromyalgia   . Depression    Past Surgical History  Procedure Date  . Fracture surgery left ankle  . Posterior cervical fusion/foraminotomy 05/09/2011    Procedure: POSTERIOR CERVICAL FUSION/FORAMINOTOMY LEVEL 1;  Surgeon: Hewitt Shorts;  Location: [facility];  Service: Neurosurgery;  Laterality: N/A;  C4/5 posterior arthrodesis with instrumentation   . Cystectomy    family history includes Diabetes in his mother. Current Outpatient Prescriptions  Medication Sig Dispense Refill  . baclofen (LIORESAL) 10 MG tablet Take 10 mg by mouth 3 (three) times daily.      . busPIRone (BUSPAR) 10 MG tablet Take 10 mg by mouth 2 (two) times daily.      Marland Kitchen dexlansoprazole (DEXILANT) 60 MG capsule Take 1 capsule (60 mg total) by mouth 2 (two) times daily before a meal.  60 capsule  2  . FLUoxetine (PROZAC) 20 MG capsule Take 60 mg by mouth daily.      Marland Kitchen lamoTRIgine (LAMICTAL) 100 MG tablet Take 100 mg by mouth 2 (two) times daily.      Marland Kitchen oxyCODONE-acetaminophen (PERCOCET) 5-325 MG per tablet Take 1 tablet by mouth every 4 (four) hours as needed.      . testosterone cypionate (DEPOTESTOTERONE CYPIONATE) 200 MG/ML injection        Allergies as of 03/19/2012 - Review Complete 03/19/2012  Allergen Reaction  Noted  . Midodrine hcl Swelling   . Sulfonamide derivatives Rash     reports that he has been smoking Cigarettes.  He has been smoking about 1 pack per day. He has quit using smokeless tobacco. His smokeless tobacco use included Chew. He reports that he does not drink alcohol or use illicit drugs.     Review of Systems: Pertinent positive and negative review of systems were noted in the above HPI section. All other review of systems were otherwise negative.  Vital signs were reviewed in today's medical record Physical Exam: General: Well developed , well nourished, no acute distress Head: Normocephalic and atraumatic Eyes:  sclerae anicteric, EOMI Ears: Normal auditory acuity Mouth: No deformity or lesions Neck: Supple, no masses or thyromegaly Lungs: Clear throughout to auscultation Heart: Regular rate and rhythm; no murmurs, rubs or bruits Abdomen: Soft, non tender and non distended. No masses, hepatosplenomegaly or hernias noted. Normal Bowel sounds Rectal:deferred Musculoskeletal: Symmetrical with no gross deformities  Skin: No lesions on visible extremities Pulses:  Normal pulses noted Extremities: No clubbing, cyanosis, edema or deformities noted Neurological: Alert oriented x 4, grossly nonfocal Cervical Nodes:  No significant cervical adenopathy Inguinal Nodes: No significant inguinal adenopathy Psychological:  Alert and cooperative. Normal mood and affect  EKG done in this office today was entirely normal.

## 2012-03-19 NOTE — Patient Instructions (Addendum)
You have been given a separate informational sheet regarding your tobacco use, the importance of quitting and local resources to help you quit.  Please get in contact with your cardiologist Dr. Marca Ancona to schedule an office visit.

## 2012-03-23 ENCOUNTER — Encounter: Payer: Self-pay | Admitting: Gastroenterology

## 2012-03-24 ENCOUNTER — Telehealth: Payer: Self-pay | Admitting: *Deleted

## 2012-03-24 ENCOUNTER — Ambulatory Visit (INDEPENDENT_AMBULATORY_CARE_PROVIDER_SITE_OTHER)
Admission: RE | Admit: 2012-03-24 | Discharge: 2012-03-24 | Disposition: A | Discharge status: Home / Self Care | Payer: BC Managed Care – PPO | Source: Ambulatory Visit | Attending: Physician Assistant | Admitting: Physician Assistant

## 2012-03-24 ENCOUNTER — Encounter: Payer: Self-pay | Admitting: Physician Assistant

## 2012-03-24 ENCOUNTER — Encounter: Payer: Self-pay | Admitting: *Deleted

## 2012-03-24 ENCOUNTER — Ambulatory Visit (INDEPENDENT_AMBULATORY_CARE_PROVIDER_SITE_OTHER): Payer: BC Managed Care – PPO | Admitting: Physician Assistant

## 2012-03-24 ENCOUNTER — Inpatient Hospital Stay (HOSPITAL_COMMUNITY)
Admission: AD | Admit: 2012-03-24 | Payer: BC Managed Care – PPO | Source: Ambulatory Visit | Admitting: Cardiovascular Disease

## 2012-03-24 VITALS — BP 114/80 | HR 74 | Ht 69.0 in | Wt 172.8 lb

## 2012-03-24 DIAGNOSIS — R079 Chest pain, unspecified: Secondary | ICD-10-CM

## 2012-03-24 DIAGNOSIS — R9389 Abnormal findings on diagnostic imaging of other specified body structures: Secondary | ICD-10-CM

## 2012-03-24 DIAGNOSIS — R918 Other nonspecific abnormal finding of lung field: Secondary | ICD-10-CM

## 2012-03-24 HISTORY — DX: Abnormal findings on diagnostic imaging of other specified body structures: R93.89

## 2012-03-24 LAB — CBC WITH DIFFERENTIAL/PLATELET
Basophils Absolute: 0 10*3/uL (ref 0.0–0.1)
Basophils Relative: 0 % (ref 0–1)
Eosinophils Absolute: 0.1 10*3/uL (ref 0.0–0.7)
Eosinophils Relative: 2 % (ref 0–5)
HCT: 36.2 % — ABNORMAL LOW (ref 39.0–52.0)
Hemoglobin: 12.2 g/dL — ABNORMAL LOW (ref 13.0–17.0)
Lymphocytes Relative: 34 % (ref 12–46)
Lymphs Abs: 1.4 10*3/uL (ref 0.7–4.0)
MCH: 30.5 pg (ref 26.0–34.0)
MCHC: 33.7 g/dL (ref 30.0–36.0)
MCV: 90.5 fL (ref 78.0–100.0)
Monocytes Absolute: 0.4 10*3/uL (ref 0.1–1.0)
Monocytes Relative: 10 % (ref 3–12)
Neutro Abs: 2.3 10*3/uL (ref 1.7–7.7)
Neutrophils Relative %: 54 % (ref 43–77)
Platelets: 163 10*3/uL (ref 150–400)
RBC: 4 MIL/uL — ABNORMAL LOW (ref 4.22–5.81)
RDW: 13 % (ref 11.5–15.5)
WBC: 4.2 10*3/uL (ref 4.0–10.5)

## 2012-03-24 LAB — BASIC METABOLIC PANEL
BUN: 7 mg/dL (ref 6–23)
CO2: 26 mEq/L (ref 19–32)
Calcium: 8.8 mg/dL (ref 8.4–10.5)
Chloride: 98 mEq/L (ref 96–112)
Creat: 1.14 mg/dL (ref 0.50–1.35)
Glucose, Bld: 77 mg/dL (ref 70–99)
Potassium: 3.9 mEq/L (ref 3.5–5.3)
Sodium: 133 mEq/L — ABNORMAL LOW (ref 135–145)

## 2012-03-24 LAB — PROTIME-INR
INR: 1.14 (ref <1.50)
Prothrombin Time: 14.5 seconds (ref 11.6–15.2)

## 2012-03-24 MED ORDER — NITROGLYCERIN 0.4 MG SL SUBL: 0.4000 mg | SUBLINGUAL_TABLET | SUBLINGUAL | Status: DC | PRN

## 2012-03-24 MED ORDER — IOHEXOL 350 MG/ML SOLN
100.0000 mL | Freq: Once | INTRAVENOUS | Status: AC | PRN
  Administered 2012-03-24: 100 mL via INTRAVENOUS

## 2012-03-24 MED ORDER — ASPIRIN EC 81 MG PO TBEC: 81.0000 mg | DELAYED_RELEASE_TABLET | Freq: Every day | ORAL | Status: DC

## 2012-03-24 MED ORDER — IBUPROFEN 400 MG PO TABS: 400.0000 mg | ORAL_TABLET | Freq: Four times a day (QID) | ORAL | Status: DC | PRN

## 2012-03-24 NOTE — Progress Notes (Signed)
[address] [place], [place]  [address] Phone: [phone] Fax:  [phone]  Date:  03/24/2012   Name:  Richard Franco   DOB:  [birth year]   MRN:  [id number]  PCP:  Rudi Heap, MD  Primary Cardiologist:  Dr. Marca Ancona  Primary Electrophysiologist:  None    History of Present Illness: Richard Franco is a 46 y.o. male who returns for evaluation of chest pain.  He has a hx of smoking, GERD, DOE and chest pain.  Myoview 5/07 negative for ischemia or scar.  Saw Dr. Marca Ancona 08/2009 for evaluation of chest pain.  He had been evaluated in the ED with normal cardiac markers and dx with pericarditis.  Ibuprofen was somewhat helpful.  ESR was 11.  BNP was 14.  PFTs were normal.  ETT 3/11: Exercised 13:20 with no ischemic changes.  Echo 3/11 with EF 50-55%, normal wall motion.  Saw GI recently with rectal bleeding.  EGD with gastritis and esophagus dilated.  Colo was normal.  Noted chest pain to Dr. Arlyce Dice and follow up arranged here today.  Patient notes left sided chest pain over the last 3-4 weeks.  It is sharp and tight.  Notes pain with exertion and assoc L arm pain and dyspnea.  Notes decreased exercise tolerance.  Notes nausea and diaphoresis, but not assoc with CP.  Has had pain at rest.  Having pain in the office today.  Symptoms are progressively getting worse.  No syncope.  No orthopnea, PND, edema.  Has had a cough.  This is non productive.  No fevers. Does have some pleuritic pain as well.  No pain with lying supine.    Labs (7/13):  K 4.5, creatinine 1.30, ALT 20, Hgb 13.6    Wt Readings from Last 3 Encounters:  03/24/12 172 lb 12.8 oz (78.382 kg)  03/19/12 171 lb 8 oz (77.792 kg)  02/18/12 170 lb (77.111 kg)     Past Medical History  Diagnosis Date  . Migraine headache     pt takes inderal for headaches  . Arthritis     s/p neck surgery  . Fibromyalgia   . Depression   . GERD (gastroesophageal reflux disease)   . Testosterone  deficiency   . Anxiety     Past Surgical History  Procedure Date  . Fracture surgery left ankle  . Posterior cervical fusion/foraminotomy 05/09/2011    Procedure: POSTERIOR CERVICAL FUSION/FORAMINOTOMY LEVEL 1;  Surgeon: Hewitt Shorts;  Location: [facility];  Service: Neurosurgery;  Laterality: N/A;  C4/5 posterior arthrodesis with instrumentation   . Cystectomy     Current Outpatient Prescriptions  Medication Sig Dispense Refill  . baclofen (LIORESAL) 10 MG tablet Take 10 mg by mouth daily. PT STATES HE TAKES 1-2 TIMES DAILY      . busPIRone (BUSPAR) 10 MG tablet Take 10 mg by mouth 2 (two) times daily.      . chlorproMAZINE (THORAZINE) 25 MG tablet Take 1 tablet by mouth Three times daily as needed. For migraines      . dexlansoprazole (DEXILANT) 60 MG capsule Take 1 capsule (60 mg total) by mouth 2 (two) times daily before a meal.  60 capsule  2  . FLUoxetine (PROZAC) 20 MG capsule Take 60 mg by mouth daily.      Marland Kitchen lamoTRIgine (LAMICTAL) 100 MG tablet Take 100 mg by mouth 2 (two) times daily.      Marland Kitchen oxyCODONE-acetaminophen (PERCOCET) 5-325 MG per tablet Take  1 tablet by mouth every 4 (four) hours as needed.      . testosterone cypionate (DEPOTESTOTERONE CYPIONATE) 200 MG/ML injection Inject into the muscle every 28 (twenty-eight) days.         Allergies: Allergies  Allergen Reactions  . Midodrine Hcl Swelling    Tongue swelling  . Sulfonamide Derivatives Rash         History  Substance Use Topics  . Smoking status: Current Every Day Smoker -- 1.0 packs/day for 27 years    Types: Cigarettes  . Smokeless tobacco: Former Neurosurgeon    Types: Chew  . Alcohol Use: No     Family History  Problem Relation Age of Onset  . Diabetes Mother   . Coronary artery disease Paternal Uncle     ROS:  Please see the history of present illness.     All other systems reviewed and negative.   PHYSICAL EXAM: VS:  BP 114/80  Pulse 74  Ht 5\' 9"  (1.753 m)  Wt 172 lb 12.8 oz (78.382 kg)   BMI 25.52 kg/m2 Well nourished, well developed, in no acute distress HEENT: normal Neck: no JVD Vascular: no carotid bruits Endo: no TM Cardiac:  normal S1, S2; RRR; no murmur; no rub Lungs:  Decreased breath sounds bilaterally, no wheezing, rhonchi or rales Abd: soft, nontender, no hepatomegaly Ext: no edema Skin: warm and dry Neuro:  CNs 2-12 intact, no focal abnormalities noted  EKG:  NSR, HR 74, normal axis, no acute changes      ASSESSMENT AND PLAN:  1. Chest Pain:  Typical >> atypical features.  Risk factors are limited to smoking hx only.  He has a pleuritic component as well.  Exertional component most concerning. Considered admission for cardiac cath.  Discussed with Dr. Charlton Haws (DOD) who also saw the patient.  Will get CTA of chest today to rule out PE, lung parenchymal disease and pericardial effusion.  Plan outpatient LHC tomorrow or next day to rule out CAD.  Start ibuprofen 400 mg TID.  Also start ASA 81 mg QD.  PRN nitro Rx given.  Risks and benefits of cardiac catheterization have been discussed with the patient.  These include bleeding, infection, kidney damage, stroke, heart attack, death.  The patient understands these risks and is willing to proceed.   Signed, Tereso Newcomer, PA-C  4:27 PM 03/24/2012

## 2012-03-24 NOTE — Telephone Encounter (Signed)
Message copied by Tarri Fuller on Tue Mar 24, 2012  5:46 PM ------      Message from: [place], [place] T      Created: Tue Mar 24, 2012  5:13 PM       D/w patient in office today.      No PE      With scattered patchy ground glass opacities and multiple pulmonary nodules in a long time smoker, I think we should have him see pulmonary.      D/w Jeneen Montgomery.      Please arrange referral to Pulmonology.  I would like to see if they can see him in the next 1-2 weeks.      Tereso Newcomer, PA-C  5:13 PM 03/24/2012

## 2012-03-24 NOTE — H&P (Signed)
History and Physical   Date:  03/24/2012   Name:  Richard Franco   DOB:  Mar 26, 1966   MRN:  [id number]  PCP:  Rudi Heap, MD  Primary Cardiologist:  Dr. Marca Ancona  Primary Electrophysiologist:  None    History of Present Illness: Richard Franco is a 46 y.o. male who returns for evaluation of chest pain.  He has a hx of smoking, GERD, DOE and chest pain.  Myoview 5/07 negative for ischemia or scar.  Saw Dr. Marca Ancona 08/2009 for evaluation of chest pain.  He had been evaluated in the ED with normal cardiac markers and dx with pericarditis.  Ibuprofen was somewhat helpful.  ESR was 11.  BNP was 14.  PFTs were normal.  ETT 3/11: Exercised 13:20 with no ischemic changes.  Echo 3/11 with EF 50-55%, normal wall motion.  Saw GI recently with rectal bleeding.  EGD with gastritis and esophagus dilated.  Colo was normal.  Noted chest pain to Dr. Arlyce Dice and follow up arranged here today.  Patient notes left sided chest pain over the last 3-4 weeks.  It is sharp and tight.  Notes pain with exertion and assoc L arm pain and dyspnea.  Notes decreased exercise tolerance.  Notes nausea and diaphoresis, but not assoc with CP.  Has had pain at rest.  Having pain in the office today.  Symptoms are progressively getting worse.  No syncope.  No orthopnea, PND, edema.  Has had a cough.  This is non productive.  No fevers. Does have some pleuritic pain as well.  No pain with lying supine.    Labs (7/13):  K 4.5, creatinine 1.30, ALT 20, Hgb 13.6    Wt Readings from Last 3 Encounters:  03/24/12 172 lb 12.8 oz (78.382 kg)  03/19/12 171 lb 8 oz (77.792 kg)  02/18/12 170 lb (77.111 kg)     Past Medical History  Diagnosis Date  . Migraine headache     pt takes inderal for headaches  . Arthritis     s/p neck surgery  . Fibromyalgia   . Depression   . GERD (gastroesophageal reflux disease)   . Testosterone deficiency   . Anxiety     Past Surgical History  Procedure Date  . Fracture surgery left  ankle  . Posterior cervical fusion/foraminotomy 05/09/2011    Procedure: POSTERIOR CERVICAL FUSION/FORAMINOTOMY LEVEL 1;  Surgeon: Hewitt Shorts;  Location: [facility];  Service: Neurosurgery;  Laterality: N/A;  C4/5 posterior arthrodesis with instrumentation   . Cystectomy     Current Outpatient Prescriptions  Medication Sig Dispense Refill  . baclofen (LIORESAL) 10 MG tablet Take 10 mg by mouth daily. PT STATES HE TAKES 1-2 TIMES DAILY      . busPIRone (BUSPAR) 10 MG tablet Take 10 mg by mouth 2 (two) times daily.      . chlorproMAZINE (THORAZINE) 25 MG tablet Take 1 tablet by mouth Three times daily as needed. For migraines      . dexlansoprazole (DEXILANT) 60 MG capsule Take 1 capsule (60 mg total) by mouth 2 (two) times daily before a meal.  60 capsule  2  . FLUoxetine (PROZAC) 20 MG capsule Take 60 mg by mouth daily.      Marland Kitchen lamoTRIgine (LAMICTAL) 100 MG tablet Take 100 mg by mouth 2 (two) times daily.      Marland Kitchen oxyCODONE-acetaminophen (PERCOCET) 5-325 MG per tablet Take 1 tablet by mouth every 4 (four) hours as needed.      Marland Kitchen  testosterone cypionate (DEPOTESTOTERONE CYPIONATE) 200 MG/ML injection Inject into the muscle every 28 (twenty-eight) days.         Allergies: Allergies  Allergen Reactions  . Midodrine Hcl Swelling    Tongue swelling  . Sulfonamide Derivatives Rash         History  Substance Use Topics  . Smoking status: Current Every Day Smoker -- 1.0 packs/day for 27 years    Types: Cigarettes  . Smokeless tobacco: Former Neurosurgeon    Types: Chew  . Alcohol Use: No     Family History  Problem Relation Age of Onset  . Diabetes Mother   . Coronary artery disease Paternal Uncle     ROS:  Please see the history of present illness.     All other systems reviewed and negative.   PHYSICAL EXAM: VS:  BP 114/80  Pulse 74  Ht 5\' 9"  (1.753 m)  Wt 172 lb 12.8 oz (78.382 kg)  BMI 25.52 kg/m2 Well nourished, well developed, in no acute distress HEENT: normal Neck:  no JVD Vascular: no carotid bruits Endo: no TM Cardiac:  normal S1, S2; RRR; no murmur; no rub Lungs:  Decreased breath sounds bilaterally, no wheezing, rhonchi or rales Abd: soft, nontender, no hepatomegaly Ext: no edema Skin: warm and dry Neuro:  CNs 2-12 intact, no focal abnormalities noted  EKG:  NSR, HR 74, normal axis, no acute changes      ASSESSMENT AND PLAN:  1. Chest Pain:  Typical >> atypical features.  Risk factors are limited to smoking hx only.  He has a pleuritic component as well.  Exertional component most concerning. Considered admission for cardiac cath.  Discussed with Dr. Charlton Haws (DOD) who also saw the patient.  Will get CTA of chest today to rule out PE, lung parenchymal disease and pericardial effusion.  Plan outpatient LHC tomorrow or next day to rule out CAD.  Start ibuprofen 400 mg TID.  Also start ASA 81 mg QD.  PRN nitro Rx given.  Risks and benefits of cardiac catheterization have been discussed with the patient.  These include bleeding, infection, kidney damage, stroke, heart attack, death.  The patient understands these risks and is willing to proceed.   Signed, Tereso Newcomer, PA-C  4:27 PM 03/24/2012     Patient examined in office  Abnormal lung exam smoking and pleuritic component.  CT with ground glass and ? Bronchogenic CA  Will R/O CAD and have pulmonary see to further w/u abnormal CT of chest.    Charlton Haws

## 2012-03-24 NOTE — Patient Instructions (Addendum)
Your physician has recommended you make the following change in your medication:  1.) START IBUPROFEN (400 MG) THREE TIMES A DAY TAKE FOR 10 DAYS 2.) START ASPIRIN ONE TABLET ( 80 MG) DAILY 3.) START NITROGLYCERIN AS NEEDED FOR CHEST PAIN   Your physician recommends that you HAVE lab work TODAY: bmet/cbc/pt/inr   Non-Cardiac CT Angiography (CTA), is a special type of CT scan that uses a computer to produce multi-dimensional views of major blood vessels throughout the body. In CT angiography, a contrast material is injected through an IV to help visualize the blood vessels. ALL READY DONE TODAY FOR DX:  UNSTABLE ANGINA/R/O PER CARDIAL FUSION AND PE   Your physician has requested that you have a cardiac catheterization. LEFT HEART CATH FOR UNSTABLE ANGINA. Cardiac catheterization is used to diagnose and/or treat various heart conditions. Doctors may recommend this procedure for a number of different reasons. The most common reason is to evaluate chest pain. Chest pain can be a symptom of coronary artery disease (CAD), and cardiac catheterization can show whether plaque is narrowing or blocking your heart's arteries. This procedure is also used to evaluate the valves, as well as measure the blood flow and oxygen levels in different parts of your heart. For further information please visit [email or web address] Please follow instruction sheet, as given.

## 2012-03-25 ENCOUNTER — Observation Stay (HOSPITAL_COMMUNITY)
Admission: EM | Admit: 2012-03-25 | Discharge: 2012-03-26 | Disposition: A | Discharge status: Home / Self Care | Payer: BC Managed Care – PPO | Attending: Cardiovascular Disease | Admitting: Cardiovascular Disease

## 2012-03-25 ENCOUNTER — Telehealth: Payer: Self-pay | Admitting: *Deleted

## 2012-03-25 ENCOUNTER — Ambulatory Visit (INDEPENDENT_AMBULATORY_CARE_PROVIDER_SITE_OTHER): Payer: BC Managed Care – PPO | Admitting: *Deleted

## 2012-03-25 ENCOUNTER — Encounter (HOSPITAL_COMMUNITY)
Admission: EM | Disposition: A | Discharge status: Home / Self Care | Payer: Self-pay | Source: Home / Self Care | Attending: Emergency Medicine

## 2012-03-25 ENCOUNTER — Encounter (HOSPITAL_COMMUNITY): Payer: Self-pay

## 2012-03-25 VITALS — BP 138/62 | HR 111 | Resp 22

## 2012-03-25 DIAGNOSIS — R0902 Hypoxemia: Secondary | ICD-10-CM

## 2012-03-25 DIAGNOSIS — F17200 Nicotine dependence, unspecified, uncomplicated: Secondary | ICD-10-CM | POA: Insufficient documentation

## 2012-03-25 DIAGNOSIS — R079 Chest pain, unspecified: Secondary | ICD-10-CM

## 2012-03-25 DIAGNOSIS — R918 Other nonspecific abnormal finding of lung field: Secondary | ICD-10-CM

## 2012-03-25 DIAGNOSIS — R091 Pleurisy: Secondary | ICD-10-CM

## 2012-03-25 DIAGNOSIS — R9389 Abnormal findings on diagnostic imaging of other specified body structures: Secondary | ICD-10-CM

## 2012-03-25 DIAGNOSIS — R0602 Shortness of breath: Secondary | ICD-10-CM | POA: Insufficient documentation

## 2012-03-25 DIAGNOSIS — R911 Solitary pulmonary nodule: Secondary | ICD-10-CM

## 2012-03-25 HISTORY — DX: Adverse effect of unspecified anesthetic, initial encounter: T41.45XA

## 2012-03-25 HISTORY — DX: Other complications of anesthesia, initial encounter: T88.59XA

## 2012-03-25 HISTORY — PX: LEFT HEART CATHETERIZATION WITH CORONARY ANGIOGRAM: SHX5451

## 2012-03-25 HISTORY — DX: Abnormal findings on diagnostic imaging of other specified body structures: R93.89

## 2012-03-25 HISTORY — DX: Tobacco use: Z72.0

## 2012-03-25 LAB — CBC
HCT: 40.4 % (ref 39.0–52.0)
Hemoglobin: 14.1 g/dL (ref 13.0–17.0)
MCH: 31.1 pg (ref 26.0–34.0)
MCHC: 34.9 g/dL (ref 30.0–36.0)
MCV: 89.2 fL (ref 78.0–100.0)
Platelets: 176 10*3/uL (ref 150–400)
RBC: 4.53 MIL/uL (ref 4.22–5.81)
RDW: 13 % (ref 11.5–15.5)
WBC: 8.1 10*3/uL (ref 4.0–10.5)

## 2012-03-25 LAB — BASIC METABOLIC PANEL
BUN: 6 mg/dL (ref 6–23)
CO2: 30 mEq/L (ref 19–32)
Calcium: 9.7 mg/dL (ref 8.4–10.5)
Chloride: 100 mEq/L (ref 96–112)
Creatinine, Ser: 1.15 mg/dL (ref 0.50–1.35)
GFR calc Af Amer: 87 mL/min — ABNORMAL LOW (ref >90)
GFR calc non Af Amer: 75 mL/min — ABNORMAL LOW (ref >90)
Glucose, Bld: 82 mg/dL (ref 70–99)
Potassium: 3.9 mEq/L (ref 3.5–5.1)
Sodium: 139 mEq/L (ref 135–145)

## 2012-03-25 LAB — TROPONIN I: Troponin I: <0.3 ng/mL (ref <0.30)

## 2012-03-25 LAB — CK TOTAL AND CKMB (NOT AT ARMC)
CK, MB: 1.8 ng/mL (ref 0.3–4.0)
Relative Index: 1.5 (ref 0.0–2.5)
Total CK: 119 U/L (ref 7–232)

## 2012-03-25 LAB — PROTIME-INR
INR: 0.98 (ref 0.00–1.49)
Prothrombin Time: 12.9 seconds (ref 11.6–15.2)

## 2012-03-25 LAB — SEDIMENTATION RATE
Sed Rate: 10 mm/hr (ref 0–16)
Sed Rate: 14 mm/hr (ref 0–16)

## 2012-03-25 SURGERY — LEFT HEART CATHETERIZATION WITH CORONARY ANGIOGRAM
Anesthesia: LOCAL

## 2012-03-25 MED ORDER — NICOTINE 14 MG/24HR TD PT24
14.0000 mg | MEDICATED_PATCH | Freq: Every day | TRANSDERMAL | Status: DC
  Administered 2012-03-25 – 2012-03-26 (×2): 14 mg via TRANSDERMAL
  Filled 2012-03-25 (×2): qty 1

## 2012-03-25 MED ORDER — ACETAMINOPHEN 325 MG PO TABS: 650.0000 mg | ORAL_TABLET | ORAL | Status: DC | PRN

## 2012-03-25 MED ORDER — NITROGLYCERIN 0.4 MG SL SUBL: 0.4000 mg | SUBLINGUAL_TABLET | SUBLINGUAL | Status: DC | PRN

## 2012-03-25 MED ORDER — SODIUM CHLORIDE 0.9 % IV SOLN: 250.0000 mL | INTRAVENOUS | Status: DC | PRN

## 2012-03-25 MED ORDER — ONDANSETRON HCL 4 MG/2ML IJ SOLN: 4.0000 mg | Freq: Four times a day (QID) | INTRAMUSCULAR | Status: DC | PRN

## 2012-03-25 MED ORDER — ASPIRIN 81 MG PO CHEW
324.0000 mg | CHEWABLE_TABLET | ORAL | Status: AC
  Administered 2012-03-25: 324 mg via ORAL

## 2012-03-25 MED ORDER — LAMOTRIGINE 100 MG PO TABS
100.0000 mg | ORAL_TABLET | Freq: Two times a day (BID) | ORAL | Status: DC
  Administered 2012-03-25 – 2012-03-26 (×2): 100 mg via ORAL
  Filled 2012-03-25 (×3): qty 1

## 2012-03-25 MED ORDER — SODIUM CHLORIDE 0.9 % IV SOLN: INTRAVENOUS | Status: AC

## 2012-03-25 MED ORDER — CHLORPROMAZINE HCL 25 MG PO TABS
25.0000 mg | ORAL_TABLET | Freq: Three times a day (TID) | ORAL | Status: DC | PRN
  Filled 2012-03-25: qty 1

## 2012-03-25 MED ORDER — MORPHINE SULFATE 2 MG/ML IJ SOLN
2.0000 mg | Freq: Once | INTRAMUSCULAR | Status: AC
  Administered 2012-03-25: 2 mg via INTRAVENOUS

## 2012-03-25 MED ORDER — METHYLPREDNISOLONE SODIUM SUCC 125 MG IJ SOLR
125.0000 mg | Freq: Once | INTRAMUSCULAR | Status: AC
  Administered 2012-03-25: 125 mg via INTRAVENOUS
  Filled 2012-03-25: qty 2

## 2012-03-25 MED ORDER — FENTANYL CITRATE 0.05 MG/ML IJ SOLN
INTRAMUSCULAR | Status: AC
  Filled 2012-03-25: qty 2

## 2012-03-25 MED ORDER — MIDAZOLAM HCL 2 MG/2ML IJ SOLN
INTRAMUSCULAR | Status: AC
  Filled 2012-03-25: qty 2

## 2012-03-25 MED ORDER — HEPARIN SODIUM (PORCINE) 1000 UNIT/ML IJ SOLN
INTRAMUSCULAR | Status: AC
  Filled 2012-03-25: qty 1

## 2012-03-25 MED ORDER — BUSPIRONE HCL 10 MG PO TABS
10.0000 mg | ORAL_TABLET | Freq: Two times a day (BID) | ORAL | Status: DC
  Administered 2012-03-25 – 2012-03-26 (×2): 10 mg via ORAL
  Filled 2012-03-25 (×3): qty 1

## 2012-03-25 MED ORDER — METHYLPREDNISOLONE SODIUM SUCC 125 MG IJ SOLR
60.0000 mg | Freq: Four times a day (QID) | INTRAMUSCULAR | Status: DC
  Administered 2012-03-25 – 2012-03-26 (×3): 60 mg via INTRAVENOUS
  Filled 2012-03-25 (×4): qty 0.96

## 2012-03-25 MED ORDER — SODIUM CHLORIDE 0.9 % IV SOLN: INTRAVENOUS | Status: DC

## 2012-03-25 MED ORDER — NITROGLYCERIN 0.4 MG SL SUBL
0.4000 mg | SUBLINGUAL_TABLET | SUBLINGUAL | Status: AC
  Administered 2012-03-25: 0.4 mg via SUBLINGUAL

## 2012-03-25 MED ORDER — MORPHINE SULFATE 2 MG/ML IJ SOLN
INTRAMUSCULAR | Status: AC
  Filled 2012-03-25: qty 1

## 2012-03-25 MED ORDER — PANTOPRAZOLE SODIUM 40 MG PO TBEC
40.0000 mg | DELAYED_RELEASE_TABLET | Freq: Every day | ORAL | Status: DC
  Administered 2012-03-26: 13:00:00 40 mg via ORAL
  Filled 2012-03-25: qty 1

## 2012-03-25 MED ORDER — SODIUM CHLORIDE 0.9 % IJ SOLN: 3.0000 mL | INTRAMUSCULAR | Status: DC | PRN

## 2012-03-25 MED ORDER — VERAPAMIL HCL 2.5 MG/ML IV SOLN
INTRAVENOUS | Status: AC
  Filled 2012-03-25: qty 2

## 2012-03-25 MED ORDER — MORPHINE SULFATE 2 MG/ML IJ SOLN
2.0000 mg | INTRAMUSCULAR | Status: DC | PRN
  Administered 2012-03-25 – 2012-03-26 (×7): 2 mg via INTRAVENOUS
  Filled 2012-03-25 (×8): qty 1

## 2012-03-25 MED ORDER — ASPIRIN EC 81 MG PO TBEC
81.0000 mg | DELAYED_RELEASE_TABLET | Freq: Every day | ORAL | Status: DC
  Administered 2012-03-26: 81 mg via ORAL
  Filled 2012-03-25: qty 1

## 2012-03-25 MED ORDER — OXYCODONE-ACETAMINOPHEN 5-325 MG PO TABS
1.0000 | ORAL_TABLET | ORAL | Status: DC | PRN
  Administered 2012-03-25 – 2012-03-26 (×4): 2 via ORAL
  Filled 2012-03-25 (×3): qty 2

## 2012-03-25 MED ORDER — MORPHINE SULFATE 2 MG/ML IJ SOLN
2.0000 mg | Freq: Once | INTRAMUSCULAR | Status: AC
  Administered 2012-03-25: 2 mg via INTRAVENOUS
  Filled 2012-03-25: qty 1

## 2012-03-25 MED ORDER — SODIUM CHLORIDE 0.9 % IJ SOLN: 3.0000 mL | Freq: Two times a day (BID) | INTRAMUSCULAR | Status: DC

## 2012-03-25 MED ORDER — BACLOFEN 10 MG PO TABS
10.0000 mg | ORAL_TABLET | Freq: Three times a day (TID) | ORAL | Status: DC | PRN
  Filled 2012-03-25: qty 1

## 2012-03-25 MED ORDER — ALBUTEROL SULFATE HFA 108 (90 BASE) MCG/ACT IN AERS
2.0000 | INHALATION_SPRAY | Freq: Four times a day (QID) | RESPIRATORY_TRACT | Status: DC | PRN
  Filled 2012-03-25: qty 6.7

## 2012-03-25 MED ORDER — OXYCODONE-ACETAMINOPHEN 5-325 MG PO TABS
ORAL_TABLET | ORAL | Status: AC
  Filled 2012-03-25: qty 2

## 2012-03-25 MED ORDER — DIAZEPAM 5 MG PO TABS: 5.0000 mg | ORAL_TABLET | ORAL | Status: AC

## 2012-03-25 MED ORDER — FLUOXETINE HCL 20 MG PO CAPS
60.0000 mg | ORAL_CAPSULE | Freq: Every day | ORAL | Status: DC
  Administered 2012-03-26: 13:00:00 60 mg via ORAL
  Filled 2012-03-25: qty 3

## 2012-03-25 NOTE — H&P (Signed)
Patient examined chart reviewed.  Patient seen in office yesterday with atypical pleuritic pain.  CT negative for PE but worrisome for bronchogenic carcinoma with ground glass and multiple nodules.  Previous history of pericardial pain.  Will have cath today to  R/O CAD and have spoken to Dr Molli Knock from pulmonary to see patient this afternoon as I believe his main issue will be pulmonary. No heparin as ECG normal and give some MSO4 and tramadol for pain.  Charlton Haws 03/25/2012

## 2012-03-25 NOTE — Interval H&P Note (Signed)
History and Physical Interval Note:  03/25/2012 12:20 PM  Richard Franco  has presented today for surgery, with the diagnosis of cp  The various methods of treatment have been discussed with the patient and family. After consideration of risks, benefits and other options for treatment, the patient has consented to  Procedure(s) (LRB) with comments: LEFT HEART CATHETERIZATION WITH CORONARY ANGIOGRAM (N/A) as a surgical intervention .  The patient's history has been reviewed, patient examined, no change in status, stable for surgery.  I have reviewed the patient's chart and labs.  Questions were answered to the patient's satisfaction.     [name]

## 2012-03-25 NOTE — H&P (Signed)
History and Physical  Patient ID: Richard Franco MRN: [id number], SOB: [birth year] 46 y.o. Date of Encounter: 03/25/2012, 9:42 AM  Primary Physician: Rudi Heap, MD Primary Cardiologist: Dr. Marca Ancona   Chief Complaint: chest pain  HPI: Per Tereso Newcomer, PA-C note from clinic visit yesterday (03/24/12): "He has a hx of smoking, GERD, DOE and chest pain. Myoview 5/07 negative for ischemia or scar. Saw Dr. Marca Ancona 08/2009 for evaluation of chest pain. He had been evaluated in the ED with normal cardiac markers and dx with pericarditis. Ibuprofen was somewhat helpful. ESR was 11. BNP was 14. PFTs were normal. ETT 3/11: Exercised 13:20 with no ischemic changes. Echo 3/11 with EF 50-55%, normal wall motion. Saw GI recently with rectal bleeding. EGD with gastritis and esophagus dilated. Colo was normal. Noted chest pain to Dr. Arlyce Dice and follow up arranged here today. Patient notes left sided chest pain over the last 3-4 weeks. It is sharp and tight. Notes pain with exertion and assoc L arm pain and dyspnea. Notes decreased exercise tolerance. Notes nausea and diaphoresis, but not assoc with CP. Has had pain at rest. Having pain in the office today. Symptoms are progressively getting worse. No syncope. No orthopnea, PND, edema. Has had a cough. This is non productive. No fevers. Does have some pleuritic pain as well. No pain with lying supine."  The patient was felt to have chest pain with typical >> atypical features, but also with a pleuritic component so was scheduled for CTA chest to r/o PE and an outpatient cardiac cath. He was placed on ibuprofen 400mg  TID, ASA 81mg  daily, and PRN SL NTG. CTA chest was without PE, but did show "scattered patchy ground glass opacities and multiple pulmonary nodules". Given is smoking history he was referred to pulmonology. He returned to clinic this morning with complaints of recurrent chest pain that started around 8:15 while driving to his neurologist's  office. Took 1 SL NTG with relief, but then drove to [employer] where the pain returned and he was given SL NTG and 324mg  ASA and transported to the ED. He reports continued mild chest pain associated with sob and diaphoresis and radiation to both shoulders. VSS. EKG without acute ST/T changes   Past Medical History  Diagnosis Date  . Migraine headache     pt takes inderal for headaches  . Arthritis     s/p neck surgery  . Fibromyalgia   . Depression   . GERD (gastroesophageal reflux disease)   . Testosterone deficiency   . Anxiety      Surgical History:  Past Surgical History  Procedure Date  . Fracture surgery left ankle  . Posterior cervical fusion/foraminotomy 05/09/2011    Procedure: POSTERIOR CERVICAL FUSION/FORAMINOTOMY LEVEL 1;  Surgeon: Hewitt Shorts;  Location: [facility];  Service: Neurosurgery;  Laterality: N/A;  C4/5 posterior arthrodesis with instrumentation   . Cystectomy      Home Meds: Medication Sig  albuterol (PROVENTIL HFA;VENTOLIN HFA) 108 (90 BASE) MCG/ACT inhaler Inhale 2 puffs into the lungs every 6 (six) hours as needed. As needed for shortness of breath.  aspirin EC 81 MG tablet Take 1 tablet (81 mg total) by mouth daily.  baclofen (LIORESAL) 10 MG tablet Take 10 mg by mouth 3 (three) times daily as needed. As needed for muscle spasms.  busPIRone (BUSPAR) 10 MG tablet Take 10 mg by mouth 2 (two) times daily.  chlorproMAZINE (THORAZINE) 25 MG tablet Take 1 tablet by mouth Three times  daily as needed. For migraines  dexlansoprazole (DEXILANT) 60 MG capsule Take 1 capsule (60 mg total) by mouth 2 (two) times daily before a meal.  FLUoxetine (PROZAC) 20 MG capsule Take 60 mg by mouth daily.  ibuprofen (ADVIL,MOTRIN) 400 MG tablet Take 1 tablet (400 mg total) by mouth every 6 (six) hours as needed for pain.  lamoTRIgine (LAMICTAL) 100 MG tablet Take 100 mg by mouth 2 (two) times daily.  nitroGLYCERIN (NITROSTAT) 0.4 MG SL tablet Place 1 tablet  (0.4 mg total) under the tongue every 5 (five) minutes as needed for chest pain.  oxyCODONE-acetaminophen (PERCOCET) 5-325 MG per tablet Take 1-2 tablets by mouth every 4 (four) hours as needed. As needed for pain.  testosterone cypionate (DEPOTESTOTERONE CYPIONATE) 200 MG/ML injection Inject into the muscle every 28 (twenty-eight) days.     Allergies:  Allergies  Allergen Reactions  . Midodrine Hcl Swelling    Tongue swelling  . Sulfonamide Derivatives Rash         History   Social History  . Marital Status: Married    Spouse Name: N/A    Number of Children: N/A  . Years of Education: N/A   Occupational History  . Not on file.   Social History Main Topics  . Smoking status: Current Every Day Smoker -- 1.0 packs/day for 27 years    Types: Cigarettes  . Smokeless tobacco: Former Neurosurgeon    Types: Chew  . Alcohol Use: No  . Drug Use: No  . Sexually Active: Not on file   Other Topics Concern  . Not on file   Social History Narrative  . No narrative on file     Family History  Problem Relation Age of Onset  . Diabetes Mother   . Coronary artery disease Paternal Uncle     Review of Systems: General: negative for chills, fever, night sweats or weight changes.  Cardiovascular: (+) chest pain, shortness of breath, diaphoresis; negative for  dyspnea on exertion, edema, orthopnea, palpitations, paroxysmal nocturnal dyspnea  Dermatological: negative for rash Respiratory: negative for cough or wheezing Urologic: negative for hematuria Abdominal: (+) nausea; negative for vomiting, diarrhea, bright red blood per rectum, melena, or hematemesis Neurologic: negative for visual changes, syncope, or dizziness All other systems reviewed and are otherwise negative except as noted above.  Labs:   Component Value Date   WBC 4.2 03/24/2012   HGB 12.2* 03/24/2012   HCT 36.2* 03/24/2012   MCV 90.5 03/24/2012   PLT 163 03/24/2012    Lab 03/24/12 1646  NA 133*  K 3.9  CL 98  CO2 26    BUN 7  CREATININE 1.14  CALCIUM 8.8  GLUCOSE 77    Radiology/Studies:   03/24/2012 - Ct Angio Chest W/cm &/or Wo Cm There is no filling defect within the opacified pulmonary arteries to suggest the presence of an acute pulmonary embolus.  No thoracic aortic aneurysm.  No dissection of the thoracic aorta.  No axillary, mediastinal, or hilar lymphadenopathy.  The heart size is normal.  No pericardial or pleural effusion.  Lung windows demonstrate scattered areas of patchy ground-glass attenuation having an upper lobe, peripheral predominance.  4 mm right upper lobe subpleural nodule is seen on image 60.  6 mm right middle lobe pulmonary nodule is seen on image 66.  3 mm left upper lobe pulmonary nodule is visible on image 57.  Bone windows reveal no worrisome lytic or sclerotic osseous lesions.  Findings:  IMPRESSION: No CT evidence for acute  pulmonary embolus.  Scattered focal areas ground-glass attenuation bilaterally with an upper lobe and slightly peripheral predominance.  This may be secondary to an infectious or inflammatory alveolitis.  Cryptogenic pneumonia would be a consideration.  The density of the patchy opacities is not as confluent is typically seen for septic emboli. Follow-up CT chest without contrast in 3 months is recommended to determine whether these findings persist.  Bilateral pulmonary parenchymal nodules measuring up to 6 mm. If the patient is at high risk for bronchogenic carcinoma, follow-up chest CT at 6-12 months is recommended.  If the patient is at low risk for bronchogenic carcinoma, follow-up chest CT at 12 months is recommended.      EKG: 03/25/12 @ 0924 - NSR 90, no acute ST/T changes, unchanged from prior EKG  Physical Exam: Blood pressure 130/80, pulse 107, temperature 98.4 F (36.9 C), temperature source Oral, resp. rate 13, SpO2 96.00%. General: Well developed, well nourished, white male in no acute distress. Head: Normocephalic, atraumatic, sclera non-icteric,  nares are without discharge Neck: Supple. Negative for carotid bruits. No JVD. Lungs: Decreased breath sounds bilaterally. Breathing is unlabored. Heart: RRR with S1 S2. No murmurs, rubs, or gallops appreciated. Abdomen: Soft, non-tender, non-distended with normoactive bowel sounds. No rebound/guarding. No obvious abdominal masses. Msk:  Strength and tone appear normal for age. Extremities: No edema. No clubbing or cyanosis. Distal pedal pulses are intact and equal bilaterally. Neuro: Alert and oriented X 3. Moves all extremities spontaneously. Psych:  Responds to questions appropriately with a normal affect.    ASSESSMENT AND PLAN:  46yom with PMHx significant for tobacco abuse, h/o pericarditis, GERD, and Anxiety who was evaluated in clinic yesterday with chest pain and scheduled for outpatient cath, but returns today with chest pain.  1. Chest pain concerning for unstable angina 2. Tobacco abuse 3. Abnormal Chest CT 4. GERD 5. Chronic neck pain  Patient presents with chest pain concerning for unstable angina. CTA chest yesterday did not show PE or acute cardiac findings, but did note scattered patchy ground glass opacities and multiple pulmonary nodules for which he is to follow up with pulmonology. He has continued pain despite SL NTG. EKG is nonischemic. Cardiac enzymes pending. Will place in observation with plans for cardiac cath today for further ischemic evaluation. Further plans pending cath results. Consult pulmonology given CT findings Provide tobacco cessation counseling. Cont PPI and outpatient analgesics.    Signed, Darleny Sem PA-C 03/25/2012, 9:42 AM

## 2012-03-25 NOTE — Consult Note (Signed)
Name: Richard Franco MRN: [id number] DOB: 1966/05/06    LOS: 0  Referring Provider:  Dr. Eden Emms Reason for Referral:  Pulmonary Nodules  PULMONARY / CRITICAL CARE MEDICINE  HPI:  46 year old male with PMH of pericarditis one year ago of unknown reason who persistently had CP since.  Patient came into the hospital with CP.  Cath was performed that was normal.  CTA of the chest was done that revealed 3 <1 cm pulmonary nodules and multiple areas of GGO.  Pain is pleuritic in nature.  Patient has been more SOB over the past 3 months.  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  No other autoimmune manifestations and no changes in environment.  No drug use.  Past Medical History  Diagnosis Date  . Migraine headache     pt takes inderal for headaches  . Arthritis     s/p neck surgery  . Fibromyalgia   . Depression   . GERD (gastroesophageal reflux disease)   . Testosterone deficiency   . Anxiety   . Complication of anesthesia     " difficult to urinate after "  . Family history of anesthesia complication     mother gets nausea after  . Shortness of breath    Past Surgical History  Procedure Date  . Fracture surgery left ankle  . Posterior cervical fusion/foraminotomy 05/09/2011    Procedure: POSTERIOR CERVICAL FUSION/FORAMINOTOMY LEVEL 1;  Surgeon: Hewitt Shorts;  Location: [facility];  Service: Neurosurgery;  Laterality: N/A;  C4/5 posterior arthrodesis with instrumentation   . Cystectomy    Prior to Admission medications   Medication Sig Start Date End Date Taking? Authorizing Provider  albuterol (PROVENTIL HFA;VENTOLIN HFA) 108 (90 BASE) MCG/ACT inhaler Inhale 2 puffs into the lungs every 6 (six) hours as needed. As needed for shortness of breath.   Yes Historical Provider, MD  aspirin EC 81 MG tablet Take 1 tablet (81 mg total) by mouth daily. 03/24/12  Yes Beatrice Lecher, PA  baclofen (LIORESAL) 10 MG tablet Take  10 mg by mouth 3 (three) times daily as needed. As needed for muscle spasms.   Yes Historical Provider, MD  busPIRone (BUSPAR) 10 MG tablet Take 10 mg by mouth 2 (two) times daily.   Yes Historical Provider, MD  chlorproMAZINE (THORAZINE) 25 MG tablet Take 1 tablet by mouth Three times daily as needed. For migraines 02/26/12  Yes Historical Provider, MD  dexlansoprazole (DEXILANT) 60 MG capsule Take 1 capsule (60 mg total) by mouth 2 (two) times daily before a meal. 01/28/12  Yes Louis Meckel, MD  FLUoxetine (PROZAC) 20 MG capsule Take 60 mg by mouth daily.   Yes Historical Provider, MD  ibuprofen (ADVIL,MOTRIN) 400 MG tablet Take 1 tablet (400 mg total) by mouth every 6 (six) hours as needed for pain. 03/24/12  Yes Beatrice Lecher, PA  lamoTRIgine (LAMICTAL) 100 MG tablet Take 100 mg by mouth 2 (two) times daily.   Yes Historical Provider, MD  nitroGLYCERIN (NITROSTAT) 0.4 MG SL tablet Place 1 tablet (0.4 mg total) under the tongue every 5 (five) minutes as needed for chest pain. 03/24/12  Yes Beatrice Lecher, PA  oxyCODONE-acetaminophen (PERCOCET) 5-325 MG per tablet Take 1-2 tablets by mouth every 4 (four) hours as needed. As needed for pain.   Yes Historical Provider, MD  testosterone cypionate (DEPOTESTOTERONE CYPIONATE) 200 MG/ML injection Inject into the muscle every 28 (twenty-eight) days.  11/21/11  Yes Historical Provider, MD   Allergies Allergies  Allergen Reactions  . Midodrine Hcl Swelling    Tongue swelling  . Sulfonamide Derivatives Rash         Family History Family History  Problem Relation Age of Onset  . Diabetes Mother   . Coronary artery disease Paternal Uncle    Social History  reports that he has been smoking Cigarettes.  He has a 27 pack-year smoking history. He has quit using smokeless tobacco. His smokeless tobacco use included Chew. He reports that he does not drink alcohol or use illicit drugs.  Review Of Systems:  Negative other than above.  Events Since  Admission: Cath 10/2 negative.  Current Status:  Vital Signs: Temp:  [98.1 F (36.7 C)-98.4 F (36.9 C)] 98.1 F (36.7 C) (10/02 1409) Pulse Rate:  [75-111] 88  (10/02 1500) Resp:  [13-22] 16  (10/02 1500) BP: (113-138)/(62-84) 113/70 mmHg (10/02 1500) SpO2:  [96 %-100 %] 98 % (10/02 1500)  Physical Examination: General:  Well built well appearing male, NAD Neuro:  Alert and oriented, moving all ext to command. HEENT:  [place]/AT, PERRL, EOM-I and MMM. Neck:  Supple, -LAN and -thyromegally. Cardiovascular:  RRR, Nl S1/S2, -M/R/G. Lungs:  CTA bilaterally. Abdomen:  Soft, NT, ND and +BS. Musculoskeletal:  -edema and -tenderness. Skin:  Intact.  Active Problems:  * No active hospital problems. *    ASSESSMENT AND PLAN  PULMONARY No results found for this basename: PHART:5,PCO2:5,PCO2ART:5,PO2ART:5,HCO3:5,O2SAT:5 in the last 168 hours   CXR:  GGO and 3 small subcm nodules  A:  The nodules in this case are a none issue at this time, the more interesting finding is GGO in a patient with pleuritic chest pain and SOB.  Manifestations of hypoxemia clinically.  History of pericarditis and strong occupational history component.  DDx include pneumoconiosis, autoimmune disorder vs amyloid (much less likely here).  27 pack year history of smoking. P:   Will send autoimmune panel. PFTs. Ambulatory desaturation. Trial of steroids, if CP ceases then likely pleurisy and that brings the question of relation to pericarditis. Will need a repeat CT in 3 months to evaluate the GGO and nodules. Smoking cessation. Serum protein electrophoresis. If GGO persist might need VATS with biopsy but would be too premature for now. Will follow with you.  Koren Bound, M.D. Pulmonary and Critical Care Medicine [facility] Pager: [phone]  03/25/2012, 4:38 PM

## 2012-03-25 NOTE — ED Notes (Addendum)
Per EMS, pt was seen yesterday at [facility] Cards for cp and scheduled for cath tomorrow. Had increase in cp and went back this morning. [place] sending over to be evaluated and cathed today. Trish, PA called to verify they would be coming to see him. ST on the monitor. 18g to LH, total of 3 NTG. [place] also gave 324 mg ASA

## 2012-03-25 NOTE — Telephone Encounter (Signed)
Pt walked into facility c/o chest pain--pt states was on way to dr [employer] office when he developed CP--he took 1 tab NTG 0.4 mg SL at 8:15 AM --pt arrived here shaky,no nausea/vomiting, no radiation , no diaphoresis, and very nervous CP was 8 on scale of 1-10 --BP=132/68, pulse 111--EKG showed s tacycardia--after EKG and v.s., pt stated he wanted to take another NTG--pt information evaluated by s weaver and decision per s weaver was to send pt to ED--PT WAS GIVEN 4 TABS 81MG  asa AND 1 TAB ntg-0.4MG  sl--pt was transported [facility] ED

## 2012-03-25 NOTE — ED Notes (Signed)
Tatyana, PA at the bedside  

## 2012-03-25 NOTE — Progress Notes (Signed)
TR BAND REMOVAL  LOCATION:    right radial  DEFLATED PER PROTOCOL:    yes  TIME BAND OFF / DRESSING APPLIED:    1600   SITE UPON ARRIVAL:    Level 0  SITE AFTER BAND REMOVAL:    Level 0  REVERSE ALLEN'S TEST:     positive  CIRCULATION SENSATION AND MOVEMENT:    Within Normal Limits   yes  COMMENTS:   Tolerated procedure well 

## 2012-03-25 NOTE — Progress Notes (Signed)
Utilization Review Completed.  

## 2012-03-25 NOTE — CV Procedure (Signed)
   Cardiac Catheterization Procedure Note  Name: Richard Franco MRN: [id number] DOB: 1965/07/18  Procedure: Left Heart Cath, Selective Coronary Angiography, LV angiography  Indication: Chest pain possible unstable angina.  Medications:  Sedation:  3 mg IV Versed, 75 mcg IV Fentanyl  Contrast:  50 mL Omnipaque   Procedural Details: The right wrist was prepped, draped, and anesthetized with 1% lidocaine. Using the modified Seldinger technique, a 5 French sheath was introduced into the right radial artery. 3 mg of verapamil was administered through the sheath, weight-based unfractionated heparin was administered intravenously. A JACKIE catheter was used for selective coronary angiography and left ventricular angiography. A JR 4 catheter was used to engage the nondominant right. Catheter exchanges were performed over an exchange length guidewire. There were no immediate procedural complications. A TR band was used for radial hemostasis at the completion of the procedure.  The patient was transferred to the post catheterization recovery area for further monitoring.  Procedural Findings:  Hemodynamics: AO:  110/81   mmHg LV:  107/7    mmHg LVEDP: 10  mmHg  Coronary angiography: Coronary dominance: Left   Left Main:  Normal.  Left Anterior Descending (LAD):  Normal. No significant disease.  1st diagonal (D1):  Normal in size and free of significant disease.  2nd diagonal (D2):  Small in size.   3rd diagonal (D3):  Small in size.  Circumflex (LCx):  Normal in size and dominant. The vessel has no significant disease.  1st obtuse marginal:  Very small in size.  2nd obtuse marginal:  Normal in size and free of significant disease .it bifurcates into 2 small distal branches  3rd obtuse marginal:  Small in size with no significant disease.   The AV groove artery is normal in size and free of significant disease. It gives medium size left PDA as well as to normal size posterolateral  branches. No significant disease is noted.  Right Coronary Artery: Small in size and nondominant. The vessel has no significant disease.  Left ventriculography: Left ventricular systolic function is normal , LVEF is estimated at 60 %, there is no significant mitral regurgitation   Final Conclusions:   1. Normal coronary arteries. 2. Normal LV systolic function. 3. Normal systemic pressure and left ventricular end-diastolic pressure.  Recommendations:  The chest pain is likely noncardiac. Continue workup of pulmonary nodules.  [name] MD, [facility] 03/25/2012, 12:47 PM

## 2012-03-25 NOTE — ED Provider Notes (Signed)
History     CSN: [id number]  Arrival date & time 03/25/12  4098   First MD Initiated Contact with Patient 03/25/12 [phone]      Chief Complaint  Patient presents with  . Chest Pain    (Consider location/radiation/quality/duration/timing/severity/associated sxs/prior treatment) HPI Comments: Richard Franco is a 46 y.o. Male who presents with complaint of chest pain, shortness of breath onset today. Pt with recurrent chest pains, seen by [facility] cardiology  Yesterday. Scheduled for cardiac catheterization tomorrow, but cp returned today, got short of breath, dizzy, states had to sit down and rest to feel better. Pt took 3 nitro with improvement, but states pain continues. Pt was sent here for [place] to see. Pt denies fever, chills, cough.      Past Medical History  Diagnosis Date  . Migraine headache     pt takes inderal for headaches  . Arthritis     s/p neck surgery  . Fibromyalgia   . Depression   . GERD (gastroesophageal reflux disease)   . Testosterone deficiency   . Anxiety     Past Surgical History  Procedure Date  . Fracture surgery left ankle  . Posterior cervical fusion/foraminotomy 05/09/2011    Procedure: POSTERIOR CERVICAL FUSION/FORAMINOTOMY LEVEL 1;  Surgeon: Hewitt Shorts;  Location: [facility];  Service: Neurosurgery;  Laterality: N/A;  C4/5 posterior arthrodesis with instrumentation   . Cystectomy     Family History  Problem Relation Age of Onset  . Diabetes Mother   . Coronary artery disease Paternal Uncle     History  Substance Use Topics  . Smoking status: Current Every Day Smoker -- 1.0 packs/day for 27 years    Types: Cigarettes  . Smokeless tobacco: Former Neurosurgeon    Types: Chew  . Alcohol Use: No      Review of Systems  Constitutional: Positive for diaphoresis. Negative for fever and chills.  HENT: Negative for neck pain and neck stiffness.   Respiratory: Positive for chest tightness and shortness of breath. Negative for cough  and wheezing.   Cardiovascular: Positive for chest pain and palpitations. Negative for leg swelling.  Gastrointestinal: Negative for nausea, vomiting and abdominal pain.  Musculoskeletal: Negative.   Skin: Negative.   Neurological: Positive for dizziness and light-headedness. Negative for weakness and numbness.    Allergies  Midodrine hcl and Sulfonamide derivatives  Home Medications   Current Outpatient Rx  Name Route Sig Dispense Refill  . ALBUTEROL SULFATE HFA 108 (90 BASE) MCG/ACT IN AERS Inhalation Inhale 2 puffs into the lungs every 6 (six) hours as needed. As needed for shortness of breath.    . ASPIRIN EC 81 MG PO TBEC Oral Take 1 tablet (81 mg total) by mouth daily. 30 tablet 11  . BACLOFEN 10 MG PO TABS Oral Take 10 mg by mouth 3 (three) times daily as needed. As needed for muscle spasms.    . BUSPIRONE HCL 10 MG PO TABS Oral Take 10 mg by mouth 2 (two) times daily.    . CHLORPROMAZINE HCL 25 MG PO TABS Oral Take 1 tablet by mouth Three times daily as needed. For migraines    . DEXLANSOPRAZOLE 60 MG PO CPDR Oral Take 1 capsule (60 mg total) by mouth 2 (two) times daily before a meal. 60 capsule 2  . FLUOXETINE HCL 20 MG PO CAPS Oral Take 60 mg by mouth daily.    . IBUPROFEN 400 MG PO TABS Oral Take 1 tablet (400 mg total) by mouth  every 6 (six) hours as needed for pain. 30 tablet 11  . LAMOTRIGINE 100 MG PO TABS Oral Take 100 mg by mouth 2 (two) times daily.    Marland Kitchen NITROGLYCERIN 0.4 MG SL SUBL Sublingual Place 1 tablet (0.4 mg total) under the tongue every 5 (five) minutes as needed for chest pain. 25 tablet 3  . OXYCODONE-ACETAMINOPHEN 5-325 MG PO TABS Oral Take 1-2 tablets by mouth every 4 (four) hours as needed. As needed for pain.    . TESTOSTERONE CYPIONATE 200 MG/ML IM OIL Intramuscular Inject into the muscle every 28 (twenty-eight) days.       BP 130/80  Pulse 107  Temp 98.4 F (36.9 C) (Oral)  Resp 13  SpO2 96%  Physical Exam  Nursing note and vitals  reviewed. Constitutional: He is oriented to person, place, and time. He appears well-developed and well-nourished. No distress.  HENT:  Head: Normocephalic.  Eyes: Conjunctivae normal are normal.  Neck: Neck supple.  Cardiovascular: Normal rate, regular rhythm and normal heart sounds.   Pulmonary/Chest: Effort normal and breath sounds normal. No respiratory distress. He has no wheezes. He has no rales.  Abdominal: Soft. Bowel sounds are normal. He exhibits no distension. There is no tenderness. There is no rebound.  Musculoskeletal: He exhibits no edema.  Neurological: He is alert and oriented to person, place, and time.  Skin: Skin is warm and dry. He is not diaphoretic.  Psychiatric: He has a normal mood and affect.    ED Course  Procedures (including critical care time)  9:48 AM Pt seen and examined. Labs entered by a nurse, that were requested by cardiology. [place] is here to see pt. They will continue his management here in ED, and possible cardiac cath today.    Date: 03/25/2012  Rate: 90  Rhythm: normal sinus rhythm  QRS Axis: normal  Intervals: normal  ST/T Wave abnormalities: normal  Conduction Disutrbances:none  Narrative Interpretation:   Old EKG Reviewed: unchanged   1. Chest pain       MDM          Lottie Mussel, PA 03/25/12 1631

## 2012-03-26 ENCOUNTER — Telehealth: Payer: Self-pay | Admitting: *Deleted

## 2012-03-26 ENCOUNTER — Encounter (HOSPITAL_COMMUNITY): Payer: Self-pay | Admitting: Cardiology

## 2012-03-26 ENCOUNTER — Inpatient Hospital Stay (HOSPITAL_BASED_OUTPATIENT_CLINIC_OR_DEPARTMENT_OTHER): Admit: 2012-03-26 | Payer: BC Managed Care – PPO | Admitting: Cardiovascular Disease

## 2012-03-26 ENCOUNTER — Observation Stay (HOSPITAL_COMMUNITY): Payer: BC Managed Care – PPO

## 2012-03-26 ENCOUNTER — Encounter (HOSPITAL_BASED_OUTPATIENT_CLINIC_OR_DEPARTMENT_OTHER): Payer: Self-pay

## 2012-03-26 DIAGNOSIS — I319 Disease of pericardium, unspecified: Secondary | ICD-10-CM

## 2012-03-26 LAB — CBC
HCT: 40.5 % (ref 39.0–52.0)
Hemoglobin: 13.9 g/dL (ref 13.0–17.0)
MCH: 30.7 pg (ref 26.0–34.0)
MCHC: 34.3 g/dL (ref 30.0–36.0)
MCV: 89.4 fL (ref 78.0–100.0)
Platelets: 195 10*3/uL (ref 150–400)
RBC: 4.53 MIL/uL (ref 4.22–5.81)
RDW: 13 % (ref 11.5–15.5)
WBC: 5.3 10*3/uL (ref 4.0–10.5)

## 2012-03-26 LAB — ANCA SCREEN W REFLEX TITER
Atypical p-ANCA Screen: NEGATIVE
c-ANCA Screen: NEGATIVE
p-ANCA Screen: NEGATIVE

## 2012-03-26 LAB — COMPREHENSIVE METABOLIC PANEL
ALT: 15 U/L (ref 0–53)
AST: 17 U/L (ref 0–37)
Albumin: 3.8 g/dL (ref 3.5–5.2)
Alkaline Phosphatase: 71 U/L (ref 39–117)
BUN: 7 mg/dL (ref 6–23)
CO2: 28 mEq/L (ref 19–32)
Calcium: 9.8 mg/dL (ref 8.4–10.5)
Chloride: 102 mEq/L (ref 96–112)
Creatinine, Ser: 1.07 mg/dL (ref 0.50–1.35)
GFR calc Af Amer: >90 mL/min (ref >90)
GFR calc non Af Amer: 82 mL/min — ABNORMAL LOW (ref >90)
Glucose, Bld: 155 mg/dL — ABNORMAL HIGH (ref 70–99)
Potassium: 4.6 mEq/L (ref 3.5–5.1)
Sodium: 137 mEq/L (ref 135–145)
Total Bilirubin: 0.2 mg/dL — ABNORMAL LOW (ref 0.3–1.2)
Total Protein: 6.6 g/dL (ref 6.0–8.3)

## 2012-03-26 LAB — ANA: Anti Nuclear Antibody(ANA): NEGATIVE

## 2012-03-26 LAB — MRSA PCR SCREENING: MRSA by PCR: NEGATIVE

## 2012-03-26 LAB — C-REACTIVE PROTEIN: CRP: <0.5 mg/dL — ABNORMAL LOW (ref <0.60)

## 2012-03-26 LAB — ANGIOTENSIN CONVERTING ENZYME: Angiotensin-Converting Enzyme: 26 U/L (ref 8–52)

## 2012-03-26 LAB — IGE: IgE (Immunoglobulin E), Serum: 1.6 IU/mL (ref 0.0–180.0)

## 2012-03-26 SURGERY — JV LEFT HEART CATHETERIZATION WITH CORONARY ANGIOGRAM
Anesthesia: Moderate Sedation

## 2012-03-26 MED ORDER — KETOROLAC TROMETHAMINE 30 MG/ML IJ SOLN
30.0000 mg | Freq: Four times a day (QID) | INTRAMUSCULAR | Status: DC
  Administered 2012-03-26: 30 mg via INTRAVENOUS
  Filled 2012-03-26 (×4): qty 1

## 2012-03-26 MED ORDER — ALBUTEROL SULFATE (5 MG/ML) 0.5% IN NEBU
2.5000 mg | INHALATION_SOLUTION | Freq: Once | RESPIRATORY_TRACT | Status: AC
  Administered 2012-03-26: 09:00:00 2.5 mg via RESPIRATORY_TRACT

## 2012-03-26 MED ORDER — PREDNISONE 20 MG PO TABS: 40.0000 mg | ORAL_TABLET | Freq: Every day | ORAL | Status: DC

## 2012-03-26 MED ORDER — MORPHINE SULFATE 2 MG/ML IJ SOLN
2.0000 mg | Freq: Once | INTRAMUSCULAR | Status: AC
  Administered 2012-03-26: 2 mg via INTRAVENOUS

## 2012-03-26 NOTE — Progress Notes (Signed)
Name: Richard Franco MRN: [id number] DOB: [birth year]    LOS: 1  Referring Provider:  Dr. Eden Emms Reason for Referral:  Pulmonary Nodules  PULMONARY / CRITICAL CARE MEDICINE  HPI:  46 year old male with PMH of pericarditis one year ago of unknown reason who persistently had CP since.  Patient came into the hospital with CP.  Cath was performed that was normal.  CTA of the chest was done that revealed 3 <1 cm pulmonary nodules and multiple areas of GGO.  Pain is pleuritic in nature.  Patient has been more SOB over the past 3 months.  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  No other autoimmune manifestations and no changes in environment.  No drug use.  Past Medical History  Diagnosis Date  . Migraine headache     pt takes inderal for headaches  . Arthritis     s/p neck surgery  . Fibromyalgia   . Depression   . GERD (gastroesophageal reflux disease)   . Testosterone deficiency   . Anxiety   . Complication of anesthesia     " difficult to urinate after "  . Family history of anesthesia complication     mother gets nausea after  . Shortness of breath    Past Surgical History  Procedure Date  . Fracture surgery left ankle  . Posterior cervical fusion/foraminotomy 05/09/2011    Procedure: POSTERIOR CERVICAL FUSION/FORAMINOTOMY LEVEL 1;  Surgeon: Hewitt Shorts;  Location: [facility];  Service: Neurosurgery;  Laterality: N/A;  C4/5 posterior arthrodesis with instrumentation   . Cystectomy   . Cardiac catheterization 03/25/2012  . Ankle fusion    Prior to Admission medications   Medication Sig Start Date End Date Taking? Authorizing Provider  albuterol (PROVENTIL HFA;VENTOLIN HFA) 108 (90 BASE) MCG/ACT inhaler Inhale 2 puffs into the lungs every 6 (six) hours as needed. As needed for shortness of breath.   Yes Historical Provider, MD  aspirin EC 81 MG tablet Take 1 tablet (81 mg total) by mouth daily. 03/24/12  Yes  Beatrice Lecher, PA  baclofen (LIORESAL) 10 MG tablet Take 10 mg by mouth 3 (three) times daily as needed. As needed for muscle spasms.   Yes Historical Provider, MD  busPIRone (BUSPAR) 10 MG tablet Take 10 mg by mouth 2 (two) times daily.   Yes Historical Provider, MD  chlorproMAZINE (THORAZINE) 25 MG tablet Take 1 tablet by mouth Three times daily as needed. For migraines 02/26/12  Yes Historical Provider, MD  dexlansoprazole (DEXILANT) 60 MG capsule Take 1 capsule (60 mg total) by mouth 2 (two) times daily before a meal. 01/28/12  Yes Louis Meckel, MD  FLUoxetine (PROZAC) 20 MG capsule Take 60 mg by mouth daily.   Yes Historical Provider, MD  ibuprofen (ADVIL,MOTRIN) 400 MG tablet Take 1 tablet (400 mg total) by mouth every 6 (six) hours as needed for pain. 03/24/12  Yes Beatrice Lecher, PA  lamoTRIgine (LAMICTAL) 100 MG tablet Take 100 mg by mouth 2 (two) times daily.   Yes Historical Provider, MD  nitroGLYCERIN (NITROSTAT) 0.4 MG SL tablet Place 1 tablet (0.4 mg total) under the tongue every 5 (five) minutes as needed for chest pain. 03/24/12  Yes Beatrice Lecher, PA  oxyCODONE-acetaminophen (PERCOCET) 5-325 MG per tablet Take 1-2 tablets by mouth every 4 (four) hours as needed. As needed for pain.   Yes Historical Provider, MD  testosterone cypionate (DEPOTESTOTERONE CYPIONATE) 200 MG/ML injection  Inject into the muscle every 28 (twenty-eight) days.  11/21/11  Yes Historical Provider, MD   Allergies Allergies  Allergen Reactions  . Midodrine Hcl Swelling    Tongue swelling  . Sulfonamide Derivatives Rash         Family History Family History  Problem Relation Age of Onset  . Diabetes Mother   . Coronary artery disease Paternal Uncle    Social History  reports that he has been smoking Cigarettes.  He has a 27 pack-year smoking history. He has quit using smokeless tobacco. His smokeless tobacco use included Chew. He reports that he does not drink alcohol or use illicit drugs.  Review Of  Systems:  Negative other than above.  Events Since Admission: Cath 10/2 negative.  Current Status:  Vital Signs: Temp:  [97.7 F (36.5 C)-98.1 F (36.7 C)] 97.7 F (36.5 C) (10/03 1108) Pulse Rate:  [77-94] 94  (10/03 1108) Resp:  [15-22] 17  (10/03 1108) BP: (113-133)/(56-104) 133/77 mmHg (10/03 1108) SpO2:  [97 %-100 %] 100 % (10/03 1108) Weight:  [80.1 kg (176 lb 9.4 oz)] 80.1 kg (176 lb 9.4 oz) (10/03 0031)  Physical Examination: General:  Well built well appearing male, NAD Neuro:  Alert and oriented, moving all ext to command. HEENT:  [place]/AT, PERRL, EOM-I and MMM. Neck:  Supple, -LAN and -thyromegally. Cardiovascular:  RRR, Nl S1/S2, -M/R/G. Lungs:  CTA bilaterally. Abdomen:  Soft, NT, ND and +BS. Musculoskeletal:  -edema and -tenderness. Skin:  Intact.  Active Problems:  Pulmonary nodule  Hypoxemia  Abnormal CT lung screening  Pleurisy   ASSESSMENT AND PLAN  PULMONARY No results found for this basename: PHART:5,PCO2:5,PCO2ART:5,PO2ART:5,HCO3:5,O2SAT:5 in the last 168 hours   CXR:  GGO and 3 small subcm nodules  A:  The nodules in this case are a none issue at this time, the more interesting finding is GGO in a patient with pleuritic chest pain and SOB.  Manifestations of hypoxemia clinically.  History of pericarditis and strong occupational history component.  DDx include pneumoconiosis, autoimmune disorder vs amyloid (much less likely here).  27 pack year history of smoking ?smoking related interstitial lung disease. P:   Autoimmune panel negative so far and with negative ESR/CRP the likelihood of this being an autoimmune situation is highly unlikely. PFTs can be done upon f/u as outpatient. Will give a week of steroids, prednisone 40 mg PO daily starting 10/4. Pulmonary F/U on 10/10 at 9AM with Dr. Frederico Hamman [facility] P[facility]). Will need a repeat CT in 3 months to evaluate the GGO and nodules. Smoking cessation. Serum protein electrophoresis pending. If GGO persist  might need VATS with biopsy but would be too premature for now.  Patient may be discharged with f/u as arranged above, PCCM signing off, please call back if needed.  Koren Bound, M.D. Pulmonary and Critical Care Medicine [facility] Pager: [phone]  03/26/2012, 2:00 PM

## 2012-03-26 NOTE — ED Provider Notes (Deleted)
History     CSN: [id number]  Arrival date & time 03/25/12  1191   First MD Initiated Contact with Patient 03/25/12 [phone]      Chief Complaint  Patient presents with  . Chest Pain    (Consider location/radiation/quality/duration/timing/severity/associated sxs/prior treatment) HPI  Past Medical History  Diagnosis Date  . Migraine headache     pt takes inderal for headaches  . Arthritis     s/p neck surgery  . Fibromyalgia   . Depression   . GERD (gastroesophageal reflux disease)   . Testosterone deficiency   . Anxiety   . Complication of anesthesia     " difficult to urinate after "  . Family history of anesthesia complication     mother gets nausea after  . Abnormal chest CT 03/2012    scattered patchy ground glass opacities and pulmonary nodules  . Chest pain     normal coronary arteries and LV fxn by cath 03/2012  . Tobacco abuse     Past Surgical History  Procedure Date  . Fracture surgery left ankle  . Posterior cervical fusion/foraminotomy 05/09/2011    Procedure: POSTERIOR CERVICAL FUSION/FORAMINOTOMY LEVEL 1;  Surgeon: Hewitt Shorts;  Location: [facility];  Service: Neurosurgery;  Laterality: N/A;  C4/5 posterior arthrodesis with instrumentation   . Cystectomy   . Cardiac catheterization 03/25/2012  . Ankle fusion     Family History  Problem Relation Age of Onset  . Diabetes Mother   . Coronary artery disease Paternal Uncle     History  Substance Use Topics  . Smoking status: Current Every Day Smoker -- 1.0 packs/day for 27 years    Types: Cigarettes  . Smokeless tobacco: Former Neurosurgeon    Types: Chew  . Alcohol Use: No      Review of Systems  Allergies  Midodrine hcl and Sulfonamide derivatives  Home Medications   Current Outpatient Rx  Name Route Sig Dispense Refill  . PREDNISONE 20 MG PO TABS Oral Take 2 tablets (40 mg total) by mouth daily. 60 tablet 0    BP 133/77  Pulse 94  Temp 97.7 F (36.5 C) (Oral)  Resp 17  Ht 5'  9" (1.753 m)  Wt 176 lb 9.4 oz (80.1 kg)  BMI 26.08 kg/m2  SpO2 100%  Physical Exam  ED Course  Procedures (including critical care time)  Labs Reviewed  BASIC METABOLIC PANEL - Abnormal; Notable for the following:    GFR calc non Af Amer 75 (*)     GFR calc Af Amer 87 (*)     All other components within normal limits  COMPREHENSIVE METABOLIC PANEL - Abnormal; Notable for the following:    Glucose, Bld 155 (*)     Total Bilirubin 0.2 (*)     GFR calc non Af Amer 82 (*)     All other components within normal limits  C-REACTIVE PROTEIN - Abnormal; Notable for the following:    CRP <0.5 (*)     All other components within normal limits  CBC  PROTIME-INR  TROPONIN I  CK TOTAL AND CKMB  SEDIMENTATION RATE  CBC  SEDIMENTATION RATE  ANA  IGE  ANGIOTENSIN CONVERTING ENZYME  PROTEIN ELECTROPHORESIS, SERUM  PROTEIN ELECTROPH W RFLX QUANT IMMUNOGLOBULINS  ANCA SCREEN W REFLEX TITER  MPO/PR-3 (ANCA) ANTIBODIES  MRSA PCR SCREENING   Ct Angio Chest W/cm &/or Wo Cm  03/24/2012  *RADIOLOGY REPORT*  Clinical Data: The chest tightness.  Shortness of breath and cough.  CT ANGIOGRAPHY CHEST  Technique:  Multidetector CT imaging of the chest using the standard protocol during bolus administration of intravenous contrast. Multiplanar reconstructed images including MIPs were obtained and reviewed to evaluate the vascular anatomy.  Contrast: OMNIPAQUE IOHEXOL 350 MG/ML SOLN  Comparison: There is no filling defect within the opacified pulmonary arteries to suggest the presence of an acute pulmonary embolus.  No thoracic aortic aneurysm.  No dissection of the thoracic aorta.  No axillary, mediastinal, or hilar lymphadenopathy.  The heart size is normal.  No pericardial or pleural effusion.  Lung windows demonstrate scattered areas of patchy ground-glass attenuation having an upper lobe, peripheral predominance.  4 mm right upper lobe subpleural nodule is seen on image 60.  6 mm right middle lobe  pulmonary nodule is seen on image 66.  3 mm left upper lobe pulmonary nodule is visible on image 57.  Bone windows reveal no worrisome lytic or sclerotic osseous lesions.  Findings:  IMPRESSION: No CT evidence for acute pulmonary embolus.  Scattered focal areas ground-glass attenuation bilaterally with an upper lobe and slightly peripheral predominance.  This may be secondary to an infectious or inflammatory alveolitis.  Cryptogenic pneumonia would be a consideration.  The density of the patchy opacities is not as confluent is typically seen for septic emboli. Follow-up CT chest without contrast in 3 months is recommended to determine whether these findings persist.  Bilateral pulmonary parenchymal nodules measuring up to 6 mm. If the patient is at high risk for bronchogenic carcinoma, follow-up chest CT at 6-12 months is recommended.  If the patient is at low risk for bronchogenic carcinoma, follow-up chest CT at 12 months is recommended.  This recommendation follows the consensus statement: Guidelines for Management of Small Pulmonary Nodules Detected on CT Scans: A Statement from the Fleischner Society as published in Radiology 2005; 237:395-400.  .   Original Report Authenticated By: ERIC A. MANSELL, M.D.      1. Chest pain   2. Chest pain, unspecified   3. Abnormal CT lung screening   4. Hypoxemia   5. Pleurisy   6. Pulmonary nodule   7. Shortness of breath       MDM  Chest pain          Cheri Guppy, MD 03/26/12 1527

## 2012-03-26 NOTE — Progress Notes (Signed)
Notified PA Shanda Bumps about elevated heart rate 120's and some chest discomfort. Pt also has high level of anxiety . Plan is to continue with discharge home with follow up appointment.

## 2012-03-26 NOTE — Discharge Summary (Signed)
Discharge Summary   Patient ID: Richard Franco MRN: [id number], DOB/AGE: 01/29/1966 46 y.o.  Primary MD: Rudi Heap, MD Primary Cardiologist: Dr. Marca Ancona Admit date: 03/25/2012 D/C date:     03/26/2012      Primary Discharge Diagnoses:  1. Chest pain without objective evidence of cardiac ischemia  - Cath revealed normal coronary arteries  - Echo showed EF 55-60%, no WMAs, grade 1 diastolic dysfunction, no significant valvular dysfunction, no pericardial effusion.   - Likely 2/2 pulmonary process  2. Ground glass opacities and pulmonary nodules on Chest CT  - Initiated on IV steroids, transitioned to oral  - Immunologic work up by pulmonology  - F/u w/ pulmonology  3. Tobacco Abuse  Secondary Discharge Diagnoses:  Marland Kitchen Migraine headache     pt takes inderal for headaches  . Arthritis     s/p neck surgery  . Fibromyalgia   . Depression   . GERD (gastroesophageal reflux disease)   . Testosterone deficiency   . Anxiety   . Complication of anesthesia     " difficult to urinate after "  . Family history of anesthesia complication     mother gets nausea after    Allergies Allergies  Allergen Reactions  . Midodrine Hcl Swelling    Tongue swelling  . Sulfonamide Derivatives Rash         Diagnostic Studies/Procedures:   03/24/2012 - Ct Angio Chest W/cm &/or Wo Cm  There is no filling defect within the opacified pulmonary arteries to suggest the presence of an acute pulmonary embolus. No thoracic aortic aneurysm. No dissection of the thoracic aorta. No axillary, mediastinal, or hilar lymphadenopathy. The heart size is normal. No pericardial or pleural effusion. Lung windows demonstrate scattered areas of patchy ground-glass attenuation having an upper lobe, peripheral predominance. 4 mm right upper lobe subpleural nodule is seen on image 60. 6 mm right middle lobe pulmonary nodule is seen on image 66. 3 mm left upper lobe pulmonary nodule is visible on image 57. Bone  windows reveal no worrisome lytic or sclerotic osseous lesions. Findings: IMPRESSION: No CT evidence for acute pulmonary embolus. Scattered focal areas ground-glass attenuation bilaterally with an upper lobe and slightly peripheral predominance. This may be secondary to an infectious or inflammatory alveolitis. Cryptogenic pneumonia would be a consideration. The density of the patchy opacities is not as confluent is typically seen for septic emboli. Follow-up CT chest without contrast in 3 months is recommended to determine whether these findings persist. Bilateral pulmonary parenchymal nodules measuring up to 6 mm. If the patient is at high risk for bronchogenic carcinoma, follow-up chest CT at 6-12 months is recommended. If the patient is at low risk for bronchogenic carcinoma, follow-up chest CT at 12 months is recommended.   03/25/12 - Cardiac Cath Hemodynamics:  AO: 110/81 mmHg  LV: 107/7 mmHg  LVEDP: 10 mmHg  Coronary angiography:  Coronary dominance: Left  Left Main: Normal.  Left Anterior Descending (LAD): Normal. No significant disease.  1st diagonal (D1): Normal in size and free of significant disease.  2nd diagonal (D2): Small in size.  3rd diagonal (D3): Small in size.  Circumflex (LCx): Normal in size and dominant. The vessel has no significant disease.  1st obtuse marginal: Very small in size.  2nd obtuse marginal: Normal in size and free of significant disease .it bifurcates into 2 small distal branches  3rd obtuse marginal: Small in size with no significant disease.  The AV groove artery is normal in size and  free of significant disease. It gives medium size left PDA as well as to normal size posterolateral branches. No significant disease is noted.  Right Coronary Artery: Small in size and nondominant. The vessel has no significant disease. Left ventriculography: Left ventricular systolic function is normal , LVEF is estimated at 60 %, there is no significant mitral regurgitation    Final Conclusions:  1. Normal coronary arteries.  2. Normal LV systolic function.  3. Normal systemic pressure and left ventricular end-diastolic pressure.  Recommendations:  The chest pain is likely noncardiac. Continue workup of pulmonary nodules.  03/26/12 - Echo Study Conclusions: - Left ventricle: The cavity size was normal. Wall thickness was normal. Systolic function was normal. The estimated ejection fraction was in the range of 55% to 60%. Wall motion was normal; there were no regional wall motion abnormalities. Doppler parameters are consistent with abnormal left ventricular relaxation (grade 1 diastolic dysfunction). - Aortic valve: There was no stenosis. - Mitral valve: No significant regurgitation. - Right ventricle: The cavity size was normal. Systolic function was normal. - Pulmonary arteries: No complete TR doppler jet so unable to estimate PA systolic pressure. - Inferior vena cava: The vessel was normal in size; the respirophasic diameter changes were in the normal range (= 50%); findings are consistent with normal central venous pressure. - Pericardium, extracardiac: There was no pericardial effusion. Impressions: Normal LV size and systolic function, EF 55-60%. Normal RV size and systolic function. No significant valvular abnormalities. No pericardial effusion.    History of Present Illness: 46 y.o. male w/ the above medical problems who presented to [facility] on 03/25/12 with complaints of chest pain. He was seen in clinic by Tereso Newcomer, PA-C on the day before presentation with complaints of sharp, tight chest pain over a 3-4 week period. The patient was felt to have chest pain with typical >> atypical features, but also with a pleuritic component so was scheduled for CTA chest to r/o PE and an outpatient cardiac cath. CTA chest was without PE, but did show scattered patchy ground glass opacities and multiple pulmonary nodules. Given is smoking history he was  referred to pulmonology. He returned to clinic on day of presentation with chest pain and was sent to the ED for further evaluation and treatment.  Hospital Course: In the ED, EKG revealed NSR 90, no acute ST/T changes, unchanged from prior EKG.  His symptoms were concerning for unstable angina so he was admitted for further evaluation and treatment. Cardiac enzymes were normal. Cardiac cath showed normal coronary arteries and LV systolic fxn. It was felt his chest pain was noncardiac and possibly related to pulmonary findings. Echo showed EF 55-60%, no WMAs, grade 1 diastolic dysfunction, no significant valvular dysfunction, no pericardial effusion. Pulmonology evaluated the patient and noted the pulmonary nodules were small and a "nonissue", but felt he needed further work up of his ground glass opacities. CRP, ESR, ANA, ACE, and IgE were WNL. He was given four doses of IV steroids and switched to oral prednisone 40mg  daily. He will follow up with pulmonology for further work up with repeat chest CT in 3mos. He was seen and evaluated by Dr. Shirlee Latch who felt he was stable for discharge home with plans for follow up as scheduled below.  Discharge Vitals: Blood pressure 133/77, pulse 94, temperature 97.7 F (36.5 C), temperature source Oral, resp. rate 17, height 5\' 9"  (1.753 m), weight 176 lb 9.4 oz (80.1 kg), SpO2 100.00%.  Labs: Component Value Date  WBC 5.3 03/26/2012   HGB 13.9 03/26/2012   HCT 40.5 03/26/2012   MCV 89.4 03/26/2012   PLT 195 03/26/2012    Lab 03/26/12 0555  NA 137  K 4.6  CL 102  CO2 28  BUN 7  CREATININE 1.07  CALCIUM 9.8  PROT 6.6  BILITOT 0.2*  ALKPHOS 71  ALT 15  AST 17  GLUCOSE 155*   Basename 03/25/12 0939  CKTOTAL 119  CKMB 1.8  TROPONINI <0.30     03/25/2012 19:12  CRP <0.5 (L)     03/25/2012 09:39 03/25/2012 19:12  Sed Rate 10 14    03/25/2012 19:12  ANA NEGATIVE  Angiotensin-Converting Enzyme 26  IgE (Immunoglobulin E), Serum 1.6    Discharge  Medications     Medication List     As of 03/26/2012  2:29 PM    STOP taking these medications         nitroGLYCERIN 0.4 MG SL tablet   Commonly known as: NITROSTAT      TAKE these medications         albuterol 108 (90 BASE) MCG/ACT inhaler   Commonly known as: PROVENTIL HFA;VENTOLIN HFA   Inhale 2 puffs into the lungs every 6 (six) hours as needed. As needed for shortness of breath.      aspirin EC 81 MG tablet   Take 1 tablet (81 mg total) by mouth daily.      baclofen 10 MG tablet   Commonly known as: LIORESAL   Take 10 mg by mouth 3 (three) times daily as needed. As needed for muscle spasms.      busPIRone 10 MG tablet   Commonly known as: BUSPAR   Take 10 mg by mouth 2 (two) times daily.      chlorproMAZINE 25 MG tablet   Commonly known as: THORAZINE   Take 1 tablet by mouth Three times daily as needed. For migraines      dexlansoprazole 60 MG capsule   Commonly known as: DEXILANT   Take 1 capsule (60 mg total) by mouth 2 (two) times daily before a meal.      FLUoxetine 20 MG capsule   Commonly known as: PROZAC   Take 60 mg by mouth daily.      ibuprofen 400 MG tablet   Commonly known as: ADVIL,MOTRIN   Take 1 tablet (400 mg total) by mouth every 6 (six) hours as needed for pain.      lamoTRIgine 100 MG tablet   Commonly known as: LAMICTAL   Take 100 mg by mouth 2 (two) times daily.      PERCOCET 5-325 MG per tablet   Generic drug: oxyCODONE-acetaminophen   Take 1-2 tablets by mouth every 4 (four) hours as needed. As needed for pain.      predniSONE 20 MG tablet   Commonly known as: DELTASONE   Take 2 tablets (40 mg total) by mouth daily.      testosterone cypionate 200 MG/ML injection   Commonly known as: DEPOTESTOTERONE CYPIONATE   Inject into the muscle every 28 (twenty-eight) days.         Disposition   Discharge Orders    Future Appointments: Provider: Department: Dept Phone: Center:   04/02/2012 9:00 AM Roxine Caddy, MD [facility] [phone] None   04/10/2012 10:45 AM Laurey Morale, MD [facility] [phone] [facility]     Future Orders Please Complete By Expires   Diet - low sodium heart healthy  Increase activity slowly      Discharge instructions      Comments:   * KEEP WRIST CATHETERIZATION SITE CLEAN AND DRY. Call the office for any signs of bleeding, pus, swelling, increased pain, or any other concerns. * NO HEAVY LIFTING (>10lbs) OR SEXUAL ACTIVITY X 7 DAYS. * NO DRIVING X 2-3 DAYS. * NO SOAKING BATHS, HOT TUBS, POOLS, ETC., X 7 DAYS.  * Your heart catheterization showed you have normal coronary arteries and your echocardiogram showed normal heart function suggesting your chest pain is NOT coming from your heart.   * Please follow up with pulmonology as scheduled for further work up of your abnormal chest CT scan findings.  * Please stop smoking!     Follow-up Information    Follow up with [facility], MD. On 04/02/2012. (9:00)    Contact information:   [facility] [address] [place] [address] [phone]       Follow up with Marca Ancona, MD. On 04/10/2012. (10:45)    Contact information:   [facility] [employer] [address] [place] [address] [phone]           Outstanding Labs/Studies:  1. PFTs 2. F/u serum protein electrophoresis 3. Repeat Chest CT in 3mos  Duration of Discharge Encounter: Greater than 30 minutes including physician and PA time.  Signed, Magic Mohler PA-C 03/26/2012, 2:29 PM

## 2012-03-26 NOTE — ED Provider Notes (Signed)
Medical screening examination/treatment/procedure(s) were performed by non-physician practitioner and as supervising physician I was immediately available for consultation/collaboration.  Cheri Guppy, MD 03/26/12 1527

## 2012-03-26 NOTE — Progress Notes (Signed)
  Echocardiogram 2D Echocardiogram has been performed.  Richard Franco 03/26/2012, 10:16 AM

## 2012-03-26 NOTE — Telephone Encounter (Signed)
pt in hospital wife aware of lab results today w/verbal understanding

## 2012-03-26 NOTE — Progress Notes (Signed)
Patient ID: Richard Franco, male   DOB: [birth year], 46 y.o.   MRN: [id number]    SUBJECTIVE: LHC yesterday with no CAD.  CT chest with ground glass infiltrates.  Seen by pulmonology, started on Solumedrol IV.  Feels a little better but still has pleuritic chest pain.      Marland Kitchen aspirin EC  81 mg Oral Daily  . busPIRone  10 mg Oral BID  . diazepam  5 mg Oral On Call  . fentaNYL      . FLUoxetine  60 mg Oral Daily  . heparin      . lamoTRIgine  100 mg Oral BID  . methylPREDNISolone (SOLU-MEDROL) injection  125 mg Intravenous Once   Followed by  . methylPREDNISolone (SOLU-MEDROL) injection  60 mg Intravenous Q6H  . midazolam      . midazolam      .  morphine injection  2 mg Intravenous Once  .  morphine injection  2 mg Intravenous Once  . nicotine  14 mg Transdermal Daily  . pantoprazole  40 mg Oral Q1200  . sodium chloride  3 mL Intravenous Q12H  . verapamil          Filed Vitals:   03/25/12 2042 03/25/12 2323 03/26/12 0031 03/26/12 0415  BP: 115/65 128/70  118/67  Pulse: 81 79  88  Temp: 98 F (36.7 C) 97.8 F (36.6 C)  97.8 F (36.6 C)  TempSrc: Oral Oral  Oral  Resp: 19 22  16   Height:   5\' 9"  (1.753 m)   Weight:   176 lb 9.4 oz (80.1 kg)   SpO2: 97% 97%  98%    Intake/Output Summary (Last 24 hours) at 03/26/12 0802 Last data filed at 03/26/12 0645  Gross per 24 hour  Intake 1731.67 ml  Output   1600 ml  Net 131.67 ml    LABS: Basic Metabolic Panel:  Basename 03/25/12 0939 03/24/12 1646  NA 139 133*  K 3.9 3.9  CL 100 98  CO2 30 26  GLUCOSE 82 77  BUN 6 7  CREATININE 1.15 1.14  CALCIUM 9.7 8.8  MG -- --  PHOS -- --   Liver Function Tests: No results found for this basename: AST:2,ALT:2,ALKPHOS:2,BILITOT:2,PROT:2,ALBUMIN:2 in the last 72 hours No results found for this basename: LIPASE:2,AMYLASE:2 in the last 72 hours CBC:  Basename 03/25/12 0939 03/24/12 1646  WBC 8.1 4.2  NEUTROABS -- 2.3  HGB 14.1 12.2*  HCT 40.4 36.2*  MCV 89.2 90.5  PLT  176 163   Cardiac Enzymes:  Basename 03/25/12 0939  CKTOTAL 119  CKMB 1.8  CKMBINDEX --  TROPONINI <0.30   BNP: No components found with this basename: POCBNP:3 D-Dimer: No results found for this basename: DDIMER:2 in the last 72 hours Hemoglobin A1C: No results found for this basename: HGBA1C in the last 72 hours Fasting Lipid Panel: No results found for this basename: CHOL,HDL,LDLCALC,TRIG,CHOLHDL,LDLDIRECT in the last 72 hours Thyroid Function Tests: No results found for this basename: TSH,T4TOTAL,FREET3,T3FREE,THYROIDAB in the last 72 hours Anemia Panel: No results found for this basename: VITAMINB12,FOLATE,FERRITIN,TIBC,IRON,RETICCTPCT in the last 72 hours  RADIOLOGY: Ct Angio Chest W/cm &/or Wo Cm  03/24/2012  *RADIOLOGY REPORT*  Clinical Data: The chest tightness.  Shortness of breath and cough.  CT ANGIOGRAPHY CHEST  Technique:  Multidetector CT imaging of the chest using the standard protocol during bolus administration of intravenous contrast. Multiplanar reconstructed images including MIPs were obtained and reviewed to evaluate the vascular anatomy.  Contrast:  OMNIPAQUE IOHEXOL 350 MG/ML SOLN  Comparison: There is no filling defect within the opacified pulmonary arteries to suggest the presence of an acute pulmonary embolus.  No thoracic aortic aneurysm.  No dissection of the thoracic aorta.  No axillary, mediastinal, or hilar lymphadenopathy.  The heart size is normal.  No pericardial or pleural effusion.  Lung windows demonstrate scattered areas of patchy ground-glass attenuation having an upper lobe, peripheral predominance.  4 mm right upper lobe subpleural nodule is seen on image 60.  6 mm right middle lobe pulmonary nodule is seen on image 66.  3 mm left upper lobe pulmonary nodule is visible on image 57.  Bone windows reveal no worrisome lytic or sclerotic osseous lesions.  Findings:  IMPRESSION: No CT evidence for acute pulmonary embolus.  Scattered focal areas  ground-glass attenuation bilaterally with an upper lobe and slightly peripheral predominance.  This may be secondary to an infectious or inflammatory alveolitis.  Cryptogenic pneumonia would be a consideration.  The density of the patchy opacities is not as confluent is typically seen for septic emboli. Follow-up CT chest without contrast in 3 months is recommended to determine whether these findings persist.  Bilateral pulmonary parenchymal nodules measuring up to 6 mm. If the patient is at high risk for bronchogenic carcinoma, follow-up chest CT at 6-12 months is recommended.  If the patient is at low risk for bronchogenic carcinoma, follow-up chest CT at 12 months is recommended.  This recommendation follows the consensus statement: Guidelines for Management of Small Pulmonary Nodules Detected on CT Scans: A Statement from the Fleischner Society as published in Radiology 2005; 237:395-400.  .   Original Report Authenticated By: ERIC A. MANSELL, M.D.    Dg Facet Jt Inj C/t Single Level Left W/fl/ct  03/04/2012  *RADIOLOGY REPORT*  SP FACET INJECTION  Clinical Data:  Cervical spondylosis without myelopathy. Cervicalgia.  Multiple prior fusions.  Left-sided facial pain with twitching. Previous C3-C7 fusion.  C2-3 facet arthropathy representing adjacent segment disease.  Procedure:  Left C2-C3 facet injection  Findings:  Informed, written consent was obtained prior to the first procedure.  The the left C2-3 facet was localized fluoroscopically, and the skin was marked.  A thorough Betadine scrub of the skin was performed, and a sterile drape was applied. The skin and subcutaneous soft tissues were anesthetized with 1% lidocaine.  Subsequently, a 25 gauge 3.5 inch spinal needle was advanced to the facet joint.  Fluoroscopy confirmed the needle tip to be intra- articular.  Subsequently,  4.0 mg Decadron was injected into the facet joint, and flushed with 0.5 ml saline.  Post procedure, the patient was improved and  able to ambulate without pain.  Fluoroscopy time:  78 seconds  IMPRESSION: Technically successful intra-articular steroid placement, left C2- C3 facet.   Original Report Authenticated By: Elsie Stain, M.D.     PHYSICAL EXAM General: NAD Neck: No JVD, no thyromegaly or thyroid nodule.  Lungs: Clear to auscultation bilaterally with normal respiratory effort. CV: Nondisplaced PMI.  Heart regular S1/S2, no S3/S4, no murmur, no rub.  No peripheral edema.  No carotid bruit.  Normal pedal pulses.  Abdomen: Soft, nontender, no hepatosplenomegaly, no distention.  Neurologic: Alert and oriented x 3.  Psych: Normal affect. Extremities: No clubbing or cyanosis.   TELEMETRY: Reviewed telemetry pt in [facility]  ASSESSMENT AND PLAN: 46 yo presents with pleuritic chest pain and dyspnea x 2 weeks.  He is not febrile or hypoxemic.  CT chest had ground glass infiltrates.  No coronary disease on cath.  He has been seen by pulmonary and started on steroids (so far without marked effect).  Autoimmune panel sent.  I will get an echo today to assess for any evidence of pericarditis (pericardial effusion).  If ok with pulmonary, will send home this evening after 4th dose of IV Solumedrol.  Will await their recommendations on po steroid regimen when he goes home.  He will need followup with pulmonary => repeat CT in 3 months, may need biopsy in future.   Marca Ancona 03/26/2012 8:05 AM

## 2012-03-26 NOTE — Telephone Encounter (Signed)
Message copied by Tarri Fuller on Thu Mar 26, 2012 12:47 PM ------      Message from: [place], [place] T      Created: Wed Mar 25, 2012  4:58 PM       [place], [place]  4:58 PM 03/25/2012

## 2012-03-27 ENCOUNTER — Telehealth: Payer: Self-pay | Admitting: Cardiology

## 2012-03-27 LAB — PROTEIN ELECTROPH W RFLX QUANT IMMUNOGLOBULINS
Albumin ELP: 64.6 % (ref 55.8–66.1)
Alpha-1-Globulin: 4.3 % (ref 2.9–4.9)
Alpha-2-Globulin: 12.6 % — ABNORMAL HIGH (ref 7.1–11.8)
Beta 2: 3.8 % (ref 3.2–6.5)
Beta Globulin: 4.8 % (ref 4.7–7.2)
Gamma Globulin: 9.9 % — ABNORMAL LOW (ref 11.1–18.8)
M-Spike, %: NOT DETECTED g/dL
Total Protein ELP: 5.5 g/dL — ABNORMAL LOW (ref 6.0–8.3)

## 2012-03-27 LAB — IGG, IGA, IGM
IgA: 128 mg/dL (ref 68–379)
IgG (Immunoglobin G), Serum: 578 mg/dL — ABNORMAL LOW (ref 650–1600)
IgM, Serum: 141 mg/dL (ref 41–251)

## 2012-03-27 LAB — PROTEIN ELECTROPHORESIS, SERUM
Albumin ELP: 63.6 % (ref 55.8–66.1)
Alpha-1-Globulin: 4.5 % (ref 2.9–4.9)
Alpha-2-Globulin: 12.6 % — ABNORMAL HIGH (ref 7.1–11.8)
Beta 2: 3.8 % (ref 3.2–6.5)
Beta Globulin: 4.7 % (ref 4.7–7.2)
Gamma Globulin: 10.8 % — ABNORMAL LOW (ref 11.1–18.8)
M-Spike, %: NOT DETECTED g/dL
Total Protein ELP: 5.6 g/dL — ABNORMAL LOW (ref 6.0–8.3)

## 2012-03-27 LAB — IMMUNOFIXATION ADD-ON

## 2012-03-27 NOTE — Telephone Encounter (Signed)
Plz return call to patient [phone]  Pt had cath on 03/25/12 via wrist, he is feeling a tingling/and burning sensation in his right hand.  Pt wanted to be sure everything was ok, plz call back to advise.

## 2012-03-27 NOTE — Telephone Encounter (Signed)
Pt states r hand is tingling and burning from cath done 10/2--advised frequently the discomfort you are feeling is because blood (bruising) caused by insertion of needle sometimes leaks out around nerves in wrist and and hand and causes pressure on nerve and causes tingling and pain--advised this will go away in time--pt staes fingers are pink and blanch well, and hand is warm with good color--pt agrees and will go to nearest ED if becomes worse

## 2012-03-28 ENCOUNTER — Telehealth: Payer: Self-pay | Admitting: Pulmonary Disease

## 2012-03-28 NOTE — Telephone Encounter (Signed)
The patient calling with nausea, no vomiting, no abdominal pain, but with diarrhea, watery, 3-4 times daily, brown;   Could be C. Diff diarrhea; I advised him to present to the ED if getting worse, or to call the office on Monday if no improvement.   Took Phenergan in the past. I called Phenergan to the pharmacy. 12.5 mg q6h prn for nausea. Advised not to drive after taking the medication.

## 2012-04-01 LAB — MPO/PR-3 (ANCA) ANTIBODIES
Myeloperoxidase Abs: 1 AU/mL (ref <20)
Serine Protease 3: 1 AU/mL (ref <20)

## 2012-04-02 ENCOUNTER — Encounter: Payer: Self-pay | Admitting: Pulmonary Disease

## 2012-04-02 ENCOUNTER — Ambulatory Visit (INDEPENDENT_AMBULATORY_CARE_PROVIDER_SITE_OTHER): Payer: BC Managed Care – PPO | Admitting: Pulmonary Disease

## 2012-04-02 VITALS — BP 112/74 | HR 83 | Temp 98.4°F | Ht 69.0 in | Wt 171.4 lb

## 2012-04-02 DIAGNOSIS — R9389 Abnormal findings on diagnostic imaging of other specified body structures: Secondary | ICD-10-CM

## 2012-04-02 DIAGNOSIS — R918 Other nonspecific abnormal finding of lung field: Secondary | ICD-10-CM

## 2012-04-02 MED ORDER — ALBUTEROL SULFATE HFA 108 (90 BASE) MCG/ACT IN AERS: 2.0000 | INHALATION_SPRAY | Freq: Four times a day (QID) | RESPIRATORY_TRACT | Status: DC | PRN

## 2012-04-02 MED ORDER — SPACER/AERO CHAMBER MOUTHPIECE MISC: 1.0000 | Status: DC

## 2012-04-02 NOTE — Patient Instructions (Addendum)
We will see you back in 2 weeks. At that time we will obtain a CT chest to evaluate for the presence of Ground glass opacities in the lung.   For the lung nodules we will obtain a repeat CT chest in 3-6 months. You will need CT surveillance for 2 years to assess stability.   I think you might have asthma. Please start using Albuterol inhaler as needed for shortness of breath.   We will repeat some of your blood tests with next visits.   Please decrease prednisone to 20 mg for 3 days, then to 10 mg for 3 days then stop.

## 2012-04-03 ENCOUNTER — Telehealth: Payer: Self-pay | Admitting: Pulmonary Disease

## 2012-04-03 ENCOUNTER — Encounter: Payer: Self-pay | Admitting: Pulmonary Disease

## 2012-04-03 DIAGNOSIS — R0602 Shortness of breath: Secondary | ICD-10-CM

## 2012-04-03 NOTE — Progress Notes (Addendum)
HPI  46 year old male with PMH of pericarditis one year ago of unknown reason who persistently had CP since. Presented to the cardiologist with atypical chest pain, somewhat pleuritic in nature.  Cath was performed that was normal. CTA of the chest was done that revealed 3 <1 cm pulmonary nodules and multiple areas of GGO.  Patient has been more SOB over the past 3 months. On further enquires he states that he noticed shortness of breath over the past several years, at rest and with exertion, associated with chest tightnes. Extensive cardiology evaluation did not reveal a cardiac etiology. In average reports daily symptoms,  2-3 times a day, waking up approximately 2 nights per month with dyspnea. He used his wife SABA inhaler at home and he noticed relief in his dyspnea.  During hospitalization he was initiated on steroids and is currently on 40 mg po daily. He endorses improved symptoms with steroids. Denies cough or wheezing, but does states that sometimes when he is short of breath he hears a noise coming from his throat.   He also has history of GERD; EGD with gastritis and dilated esophagus.    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 No other autoimmune manifestations and no changes in environment. No drug use.  Occupation: toxic exposure to aluminum, steel, plastic dust Travels:no travels abroad TB exposure:denis Pets:none   Past Medical History  Diagnosis Date  . Migraine headache     pt takes inderal for headaches  . Arthritis     s/p neck surgery  . Fibromyalgia   . Depression   . GERD (gastroesophageal reflux disease)   . Testosterone deficiency   . Anxiety   . Complication of anesthesia     " difficult to urinate after "  . Family history of anesthesia complication     mother gets nausea after  . Abnormal chest CT 03/2012    scattered patchy ground glass opacities and pulmonary nodules  . Chest pain       normal coronary arteries and LV fxn by cath 03/2012  . Tobacco abuse    Past Surgical History  Procedure Date  . Fracture surgery left ankle  . Posterior cervical fusion/foraminotomy 05/09/2011    Procedure: POSTERIOR CERVICAL FUSION/FORAMINOTOMY LEVEL 1;  Surgeon: Hewitt Shorts;  Location: [facility];  Service: Neurosurgery;  Laterality: N/A;  C4/5 posterior arthrodesis with instrumentation   . Cystectomy   . Cardiac catheterization 03/25/2012  . Ankle fusion    History  Substance Use Topics  . Smoking status: Former Smoker -- 1.0 packs/day for 27 years    Types: Cigarettes    Quit date: 03/24/2012  . Smokeless tobacco: Former Neurosurgeon    Types: Chew  . Alcohol Use: No   Family History  Problem Relation Age of Onset  . Diabetes Mother   . Coronary artery disease Paternal Uncle      Allergies  Allergen Reactions  . Midodrine Hcl Swelling    Tongue swelling  . Sulfonamide Derivatives Rash        Review of Systems  Constitutional: Positive for diarrhea. Negative for fever.  HENT: Negative for ear pain, congestion, sore throat, sneezing and trouble swallowing. Negative for dental problem.   Respiratory: Per HPI   Cardiovascular: Per HPI Gastrointestinal: Positive for abdominal pain.  Musculoskeletal: Negative for joint swelling.  Skin: Negative for rash.  Neurological: Positive for numbness.  Psychiatric/Behavioral: Positive for dysphoric mood. The patient  is nervous/anxious.    Physical Exam BP 112/74  Pulse 83  Temp 98.4 F (36.9 C) (Oral)  Ht 5\' 9"  (1.753 m)  Wt 171 lb 6.4 oz (77.747 kg)  BMI 25.31 kg/m2  SpO2 99%  General: Comfortable HEENT ; pupils round and reactive to light; mild nasal mucosa edema and erythema; orophpharyngeal with no erythema, edema or exudate;  LAD: no cervical LAD  Cardiovascular: s1s2, no murmurs. Regular rate and rhythm.  Respiratory: Decreased breath sounds bilaterally with no wheezing No increased work of breathing.   Abdomen: soft NT ND BS+.   Extremities: no pedal edema. Homans negative  Central nervous system: Alert and oriented No focal neurological deficits.  PFTs 04/02/12         03/26/12 FVC 4.93 101%         5.1 105% (pre) 5.04 104% (post ) FEV1 4.03 102%       4.04 106% (pre) 4.26 111% (post) FEV1/FVC 82              DLCO                         90% TLC                           109% RV/TLC                     112%  MEP                              37% MIP                                69%  Results for orders placed during the hospital encounter of 03/25/12  CBC      Component Value Range   WBC 8.1  4.0 - 10.5 K/uL   RBC 4.53  4.22 - 5.81 MIL/uL   Hemoglobin 14.1  13.0 - 17.0 g/dL   HCT 16.1  09.6 - 04.5 %   MCV 89.2  78.0 - 100.0 fL   MCH 31.1  26.0 - 34.0 pg   MCHC 34.9  30.0 - 36.0 g/dL   RDW 40.9  81.1 - 91.4 %   Platelets 176  150 - 400 K/uL  BASIC METABOLIC PANEL      Component Value Range   Sodium 139  135 - 145 mEq/L   Potassium 3.9  3.5 - 5.1 mEq/L   Chloride 100  96 - 112 mEq/L   CO2 30  19 - 32 mEq/L   Glucose, Bld 82  70 - 99 mg/dL   BUN 6  6 - 23 mg/dL   Creatinine, Ser 7.82  0.50 - 1.35 mg/dL   Calcium 9.7  8.4 - 95.6 mg/dL   GFR calc non Af Amer 75 (*) >90 mL/min   GFR calc Af Amer 87 (*) >90 mL/min  PROTIME-INR      Component Value Range   Prothrombin Time 12.9  11.6 - 15.2 seconds   INR 0.98  0.00 - 1.49  TROPONIN I      Component Value Range   Troponin I <0.30  <0.30 ng/mL  CK TOTAL AND CKMB      Component Value Range   Total CK 119  7 - 232 U/L  CK, MB 1.8  0.3 - 4.0 ng/mL   Relative Index 1.5  0.0 - 2.5  SEDIMENTATION RATE      Component Value Range   Sed Rate 10  0 - 16 mm/hr  CBC      Component Value Range   WBC 5.3  4.0 - 10.5 K/uL   RBC 4.53  4.22 - 5.81 MIL/uL   Hemoglobin 13.9  13.0 - 17.0 g/dL   HCT 29.5  62.1 - 30.8 %   MCV 89.4  78.0 - 100.0 fL   MCH 30.7  26.0 - 34.0 pg   MCHC 34.3  30.0 - 36.0 g/dL   RDW 65.7  84.6 - 96.2 %    Platelets 195  150 - 400 K/uL  COMPREHENSIVE METABOLIC PANEL      Component Value Range   Sodium 137  135 - 145 mEq/L   Potassium 4.6  3.5 - 5.1 mEq/L   Chloride 102  96 - 112 mEq/L   CO2 28  19 - 32 mEq/L   Glucose, Bld 155 (*) 70 - 99 mg/dL   BUN 7  6 - 23 mg/dL   Creatinine, Ser 9.52  0.50 - 1.35 mg/dL   Calcium 9.8  8.4 - 84.1 mg/dL   Total Protein 6.6  6.0 - 8.3 g/dL   Albumin 3.8  3.5 - 5.2 g/dL   AST 17  0 - 37 U/L   ALT 15  0 - 53 U/L   Alkaline Phosphatase 71  39 - 117 U/L   Total Bilirubin 0.2 (*) 0.3 - 1.2 mg/dL   GFR calc non Af Amer 82 (*) >90 mL/min   GFR calc Af Amer >90  >90 mL/min  PROTEIN ELECTROPHORESIS, SERUM      Component Value Range   Total Protein ELP 5.6 (*) 6.0 - 8.3 g/dL   Albumin ELP 32.4  40.1 - 66.1 %   Alpha-1-Globulin 4.5  2.9 - 4.9 %   Alpha-2-Globulin 12.6 (*) 7.1 - 11.8 %   Beta Globulin 4.7  4.7 - 7.2 %   Beta 2 3.8  3.2 - 6.5 %   Gamma Globulin 10.8 (*) 11.1 - 18.8 %   M-Spike, % NOT DETECTED     SPE Interp. (NOTE)     Comment (NOTE)    PROTEIN ELECTROPH W RFLX QUANT IMMUNOGLOBULINS      Component Value Range   Total Protein ELP 5.5 (*) 6.0 - 8.3 g/dL   Albumin ELP 02.7  25.3 - 66.1 %   Alpha-1-Globulin 4.3  2.9 - 4.9 %   Alpha-2-Globulin 12.6 (*) 7.1 - 11.8 %   Beta Globulin 4.8  4.7 - 7.2 %   Beta 2 3.8  3.2 - 6.5 %   Gamma Globulin 9.9 (*) 11.1 - 18.8 %   M-Spike, % NOT DETECTED     SPE Interp. (NOTE)     Comment (NOTE)    C-REACTIVE PROTEIN      Component Value Range   CRP <0.5 (*) <0.60 mg/dL  SEDIMENTATION RATE      Component Value Range   Sed Rate 14  0 - 16 mm/hr  ANA      Component Value Range   ANA NEGATIVE  NEGATIVE  ANCA SCREEN W REFLEX TITER      Component Value Range   c-ANCA Screen NEGATIVE  NEGATIVE   p-ANCA Screen NEGATIVE  NEGATIVE   Atypical p-ANCA Screen NEGATIVE  NEGATIVE  MPO/PR-3 (ANCA) ANTIBODIES  Component Value Range   Myeloperoxidase Abs 1  <20 AU/mL   Serine Protease 3 1  <20 AU/mL   IGE      Component Value Range   IgE (Immunoglobulin E), Serum 1.6  0.0 - 180.0 IU/mL  ANGIOTENSIN CONVERTING ENZYME      Component Value Range   Angiotensin-Converting Enzyme 26  8 - 52 U/L  MRSA PCR SCREENING      Component Value Range   MRSA by PCR NEGATIVE  NEGATIVE  IMMUNOFIXATION ADD-ON      Component Value Range   Immunofix Electr Int (NOTE)    IGG, IGA, IGM      Component Value Range   IgG (Immunoglobin G), Serum 578 (*) 650 - 1600 mg/dL   IgA 696  68 - 295 mg/dL   IgM, Serum 284  41 - 251 mg/dL     CTA 13/07/4399 No CT evidence for acute pulmonary embolus.  Scattered focal areas ground-glass attenuation bilaterally with an  upper lobe and slightly peripheral predominance. This may be  secondary to an infectious or inflammatory alveolitis. Cryptogenic  pneumonia would be a consideration. The density of the patchy  opacities is not as confluent is typically seen for septic emboli.  Follow-up CT chest without contrast in 3 months is recommended to  determine whether these findings persist.  Bilateral pulmonary parenchymal nodules measuring up to 6.2 mm. If  the patient is at high risk for bronchogenic carcinoma, follow-up  chest CT at 6-12 months is recommended. If the patient is at low  risk for bronchogenic carcinoma, follow-up chest CT at 12 months is  recommended. This recommendation follows the consensus statement:  Guidelines for Management of Small Pulmonary Nodules Detected on CT  Scans: A Statement from the Fleischner Society as published in  Radiology 2005; 237:395-400.    SPEP note The possibility of a faint restricted band(s) cannot be completely excluded in the gamma region. Suggest serum IFE to evaluate possibility, if clinically indicated.   A/P GGO the patient highly worried that this might be malignancy; I suspect infection vs either a component of eosinophilic pneumonia vs organizing pneumonia vs occupational  disease.  The association with small  lung nodules and dyspnea makes me consider eosinophilic infiltrates. The symptoms clearly responded to steroids.   We will see him back in 2 weeks. At that time we will obtain a CT chest to evaluate for the presence of Ground glass opacities in the lung.  If GGO still present would consider bronchoscopy with BAL with cell count/differential and microbiology.   Lung nodules 3-6.2 mm. For the lung nodules we will obtain a repeat CT chest in 3 months. You will need CT surveillance for 2 years to assess stability.  Etiology unclear. See the other problems. Autoimmune work up negative.   Chronic dyspnea and chest tightness;  I suspect a component of asthma (200cc improvement in FEV1 with bronchodilators)  especially since he seems to see a benefit from bronchodialtors and prednisone. I advised him to start using Albuterol inhaler as needed for shortness of breath.  He would benefit from methacoline challenge. I advised him to taper prednisone to off. We will see him back in 2 weeks to assess symptoms after prednisone discontinuation and initiation of inhaled Albuterol. He might benefit from ICS.   Occupational toxic exposure.  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 Aluminum dust exposure could give nodular lung  disease/chronic dyspnea. Would recommend Aluminum plasma and urine level measurement with next visit.   IgG slightly low. Will repeat and also obtain IgG subclasses with next visit. Suspect they are low from steroid use.   Smoking cessation counseling provided today in clinic. He quit 03/24/2012.   Abnormal SPEP and family history of amyloidosis. We will repeat SPEP, if persistently abnormal we will proceed to IFE.   Testosterone deficiency With lung nodules one of the concerns is that of malignancy. In a young man testicular cancer is in the differential. He is seeing a urologist in town, so I recommend close  follow up with him for testicular problems and r/o testicular malignancy.   GERD continue dexilant.   Decreased MIP and MEP I am unclear how to interpret this result since the PFTs are impressively good. I will consider repeating them in the future. It is possible that he has a component of diaphragmatic weaknesses given h/o C4-C5 surgery. I am unclear if contributory to his dyspnea. I would not pursue further diagnostic testing for this condition until asthma/occupational disease r/u as above.   Flow volume loops with possible variable extrathoracic upper airway obstruction. More likely effort related, however with any future  Concerns we will consider ENT evaluation.   Diarrhea he called a couple of days ago complaining  of nausea and diarrhea. Since then resolved.   F/U in 2 weeks with CT chest non contrast, repeat SPEP and Immunoglobulin profile plus IgG subclasses, Aluminum urine and plasma levels. He will need the flu vaccine with next visit.  The patient is young with complex PMH. I spend more than 45 min with him and his wife in clinic to discuss current findings and future plan.

## 2012-04-03 NOTE — Telephone Encounter (Signed)
Dr. Frederico Hamman, I am not able to pull up alluminum plasma in [facility]. An Aluminum level pulls up. Also please advise DX for both of these labs. Thank You

## 2012-04-03 NOTE — Telephone Encounter (Signed)
Richard Franco, Mr. Chrissie Noa needs to have Aluminum plasma and urine levels with next visit. Could you please add them on to the tests that we already ordered? Thank you.

## 2012-04-03 NOTE — Telephone Encounter (Signed)
Is it okay to use DX: occupational exposure in workplace? Please advise Thank You

## 2012-04-03 NOTE — Telephone Encounter (Signed)
Aluminum level I think it will be the same think, as long as it is a blood test. Thank you so much. Annabelle Harman

## 2012-04-03 NOTE — Telephone Encounter (Signed)
Ok to use occupational exposure in work place Thank you

## 2012-04-10 ENCOUNTER — Ambulatory Visit (INDEPENDENT_AMBULATORY_CARE_PROVIDER_SITE_OTHER): Payer: BC Managed Care – PPO | Admitting: Cardiology

## 2012-04-10 ENCOUNTER — Encounter: Payer: Self-pay | Admitting: Cardiology

## 2012-04-10 VITALS — BP 109/73 | HR 92 | Ht 69.0 in | Wt 171.0 lb

## 2012-04-10 DIAGNOSIS — R079 Chest pain, unspecified: Secondary | ICD-10-CM

## 2012-04-10 DIAGNOSIS — R918 Other nonspecific abnormal finding of lung field: Secondary | ICD-10-CM

## 2012-04-10 DIAGNOSIS — R9389 Abnormal findings on diagnostic imaging of other specified body structures: Secondary | ICD-10-CM

## 2012-04-10 NOTE — Progress Notes (Signed)
Patient ID: Richard Franco, male   DOB: [birth year], 46 y.o.   MRN: [id number] PCP: Dr. Christell Constant  46 yo with history of GERD and smoking was recently admitted for evaluation of pleuritic chest pain and exertional dyspnea.  He ended up having a left heart cath, which showed no significant coronary disease.  Echo was normal-appearing.  CTA of the chest, however, showed diffuse ground glass infiltrates.  Patient was treated with steroids.  The steroids significantly improved his symptoms.  He no longer has chest pain, and his breathing is much better.  He also had been having palpitations.  These are much improved since he cut back on caffeine.   Labs (10/13): K 3.9, creatinine 1.15, ESR 10, HCT 40.5, SPEP negative, CRP < 0.5, ANA negative, ANCA negative, ACE level normal.    Allergies (verified):  1) ! Sulfa  2) ! Midodrine Hcl   Past Medical History:  1. Headache  2. Osteoarthritis  3. Depression  4. Chronic cervical spine disease, status post multiple surgeries with chronic pain  5. Insomnia, late onset of sleep, and he sleeps until late in the morning  6. Tobacco abuse: Quit 10/13.  7. GERD  8. Chest pain: ETT-myoview (5/07): No ischemia or infarction. ETT (3/11) normal.  LHC (10/13): EF 60%, normal coronary arteries.  9. Echo (10/13) with EF 55-60%, normal RV, normal valves.  10. Ground glass lung infiltrates: Admitted with pleuritic chest pain in 10/13.  CTA chest showed multiple areas of ground glass opacity.  Ddx = infection versus eosinophilic PNA versus organizing PNA versus pneumoconiosis.  11. 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  Married, monogamous, no hx of STDs  Quit smoking in 10/13.  Alcohol use-yes  Drug use-no  remote marijuana, cocaine  [place]. [place]  animals.  One outside dog.   Review of Systems  All systems reviewed and negative except as per HPI.   Current Outpatient Prescriptions  Medication Sig Dispense Refill  . albuterol (PROVENTIL HFA;VENTOLIN HFA) 108 (90 BASE) MCG/ACT inhaler Inhale 2 puffs into the lungs every 6 (six) hours as needed for wheezing.  1 Inhaler  6  . baclofen (LIORESAL) 10 MG tablet Take 10 mg by mouth 3 (three) times daily as needed. As needed for muscle spasms.      . busPIRone (BUSPAR) 10 MG tablet Take 10 mg by mouth 2 (two) times daily.      . chlorproMAZINE (THORAZINE) 25 MG tablet Take 1 tablet by mouth as needed. For migraines      . dexlansoprazole (DEXILANT) 60 MG capsule Take 1 capsule (60 mg total) by mouth 2 (two) times daily before a meal.  60 capsule  2  . FLUoxetine (PROZAC) 20 MG capsule Take 60 mg by mouth daily.      Marland Kitchen lamoTRIgine (LAMICTAL) 100 MG tablet Take 100 mg by mouth 2 (two) times daily.      Marland Kitchen oxyCODONE-acetaminophen (PERCOCET) 5-325 MG per tablet Take 1-2 tablets by mouth every 4 (four) hours as needed. As needed for pain.      Marland Kitchen Spacer/Aero Chamber Mouthpiece MISC 1 applicator by Does not apply route 1 day or 1 dose.  1 each  0  . testosterone cypionate (DEPOTESTOTERONE CYPIONATE) 200 MG/ML injection Inject into the muscle every 28 (twenty-eight) days.  BP 109/73  Pulse 92  Ht 5\' 9"  (1.753 m)  Wt 171 lb (77.565 kg)  BMI 25.25 kg/m2 General: NAD Neck: No JVD, no thyromegaly or thyroid nodule.  Lungs: Clear to auscultation bilaterally with normal respiratory effort. CV: Nondisplaced PMI.  Heart regular S1/S2, no S3/S4, no murmur.  No peripheral edema.  No carotid bruit.  Normal pedal pulses.  Abdomen: Soft, nontender, no hepatosplenomegaly, no distention.  Skin: Intact without lesions or rashes.  Neurologic: Alert and oriented x 3.  Psych: Normal affect. Extremities: No clubbing or cyanosis.  HEENT: Normal.   Assessment/Plan: 1. Chest pain: I do not think this was  cardiac-related.  I suspect it was a symptom of his underlying lung disease.  LHC was normal.  No evidence for pericarditis on echo.  2. Palpitations: Suspect premature beats.  Much improved with cutting back on caffeine.  3. Ground glass infiltrates: Undifferentiated lung disease.  Possible diagnoses include pneumoconiosis, eosinophilic PNA, organizing PNA.  He has improved (no chest pain or dyspnea) with steroids.  He has been seeing pulmonary.  Plan is for a repeat CT, and if the ground glass infiltrates have not resolved, then possible bronchoscopy with BAL.    Marca Ancona 04/11/2012

## 2012-04-10 NOTE — Patient Instructions (Signed)
Your physician recommends that you schedule a follow-up appointment as needed with Dr McLean.  

## 2012-04-15 ENCOUNTER — Ambulatory Visit (INDEPENDENT_AMBULATORY_CARE_PROVIDER_SITE_OTHER)
Admission: RE | Admit: 2012-04-15 | Discharge: 2012-04-15 | Disposition: A | Discharge status: Home / Self Care | Payer: BC Managed Care – PPO | Source: Ambulatory Visit | Attending: Pulmonary Disease | Admitting: Pulmonary Disease

## 2012-04-15 DIAGNOSIS — R9389 Abnormal findings on diagnostic imaging of other specified body structures: Secondary | ICD-10-CM

## 2012-04-15 DIAGNOSIS — R918 Other nonspecific abnormal finding of lung field: Secondary | ICD-10-CM

## 2012-04-16 ENCOUNTER — Encounter: Payer: Self-pay | Admitting: Internal Medicine

## 2012-04-16 ENCOUNTER — Other Ambulatory Visit: Payer: Self-pay | Admitting: Pulmonary Disease

## 2012-04-16 ENCOUNTER — Other Ambulatory Visit (INDEPENDENT_AMBULATORY_CARE_PROVIDER_SITE_OTHER): Payer: BC Managed Care – PPO

## 2012-04-16 ENCOUNTER — Other Ambulatory Visit: Payer: BC Managed Care – PPO

## 2012-04-16 ENCOUNTER — Ambulatory Visit (INDEPENDENT_AMBULATORY_CARE_PROVIDER_SITE_OTHER): Payer: BC Managed Care – PPO | Admitting: Internal Medicine

## 2012-04-16 VITALS — BP 118/74 | HR 86 | Temp 97.7°F | Ht 65.0 in | Wt 173.6 lb

## 2012-04-16 DIAGNOSIS — R9389 Abnormal findings on diagnostic imaging of other specified body structures: Secondary | ICD-10-CM

## 2012-04-16 DIAGNOSIS — R918 Other nonspecific abnormal finding of lung field: Secondary | ICD-10-CM

## 2012-04-16 DIAGNOSIS — Z23 Encounter for immunization: Secondary | ICD-10-CM

## 2012-04-16 LAB — SEDIMENTATION RATE: Sed Rate: 17 mm/hr (ref 0–22)

## 2012-04-16 LAB — RHEUMATOID FACTOR: Rhuematoid fact SerPl-aCnc: <10 IU/mL (ref <=14)

## 2012-04-16 MED ORDER — PREDNISONE 10 MG PO TABS: ORAL_TABLET | ORAL | Status: DC

## 2012-04-16 NOTE — Assessment & Plan Note (Addendum)
I had an extended discussion with the patient today lasting 15 to 20 minutes of a 25 minute visit on the following issues:  He has GG changes that I migratory and probably steroid responsive typical of boop with esr unimpressive but a fm hx of ? RA so RA serology sent - smoking related EG, DIP, RBILD also in ddx but he says he's now quit smoking successfully  The goal with a chronic steroid dependent illness is always arriving at the lowest effective dose that controls the disease/symptoms and not accepting a set "formula" which is based on statistics or guidelines that don't always take into account patient  variability or the natural hx of the dz in every individual patient, which may well vary over time.  For now therefore I recommend the patient maintain  A ceiling of 20 mg and a floor of 10 mg daily until sees Dr Frederico Hamman to regroup re  Specific diagnosis - in the meantime key that he maintain off cigarettes at all costs.

## 2012-04-16 NOTE — Patient Instructions (Addendum)
Prednisone 10mg  2 each am until 100% better,  Then 1 each am until seen  Please remember to go to the lab  department downstairs for your tests - we will call you with the results when they are available.  Follow up Dr Frederico Hamman 2-4 weeks, sooner if needed

## 2012-04-16 NOTE — Progress Notes (Signed)
HPI  46 yowm quit smoking Oct 1  with PMH of pericarditis one year ago of unknown reason who persistently had CP since. Presented to the cardiologist with atypical chest pain, somewhat pleuritic in nature.  Cath was performed that was normal. CTA of the chest was done that revealed 3 <1 cm pulmonary nodules and multiple areas of GGO.  Cc more SOB over the past 3 months.  First noted 3 years ago at rest and with exertion, associated with chest tightness > improved symptoms with steroids.     He also has history of GERD; EGD with gastritis and dilated esophagus.   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 No other autoimmune manifestations and no changes in environment. No drug use.  Occupation: toxic exposure to aluminum, steel, plastic dust Travels:no travels abroad TB exposure:denies Pets:none  rec We will see you back in 2 weeks. At that time we will obtain a CT chest to evaluate for the presence of Ground glass opacities in the lung.  For the lung nodules we will obtain a repeat CT chest in 3-6 months. You will need CT surveillance for 2 years to assess stability.  you might have asthma. Please start using Albuterol inhaler as needed for shortness of breath.  We will repeat some of your blood tests with next visits.  Please decrease prednisone to 20 mg for 3 days, then to 10 mg for 3 days then stop.  04/16/2012 f/u ov/Wert cc arthritis (neck , wrists and hands) cp and sob all better @ approx 90% improvement while  on prednisone finished one week prior to OV  Then slt worse since.  Last used saba 10/23.  No obvious daytime variabilty or assoc chronic cough or cp or chest tightness, subjective wheeze overt sinus or hb symptoms. No unusual exp hx.  Sleeping ok without nocturnal  or early am exacerbation  of respiratory  c/o's or need for noct saba. Also denies any obvious fluctuation of symptoms with weather or  environmental changes or other aggravating or alleviating factors except as outlined above   ROS  The following are not active complaints unless bolded sore throat, dysphagia, dental problems, itching, sneezing,  nasal congestion or excess/ purulent secretions, ear ache,   fever, chills, sweats, unintended wt loss, pleuritic or exertional cp, hemoptysis,  orthopnea pnd or leg swelling, presyncope, palpitations, heartburn, abdominal pain, anorexia, nausea, vomiting, diarrhea  or change in bowel or urinary habits, change in stools or urine, dysuria,hematuria,  rash, arthralgias, visual complaints, headache, numbness weakness or ataxia or problems with walking or coordination,  change in mood/affect or memory.         Physical Exam Anxious amb wm who initially  failed to answer a single question asked in a straightforward manner, tending to go off on tangents or answer questions with  medical terms ("my nodules/my ground glass changes")  or diagnoses and seemed aggravated  when asked the same question re specific symptoms    General: Comfortable HEENT ; pupils round and reactive to light; mild nasal mucosa edema and erythema; orophpharyngeal with no erythema, edema or exudate;  LAD: no cervical LAD  Cardiovascular: s1s2, no murmurs. Regular rate and rhythm.  Respiratory: Decreased breath sounds bilaterally with no wheezing No increased work of breathing.  Abdomen: soft NT ND BS+.   Extremities: no pedal edema. Homans negative        CT Chest 04/15/12 1. Interval improvement in areas of previously  seen patchy  pulmonary parenchymal ground-glass, with complete resolution. New  areas of patchy airspace opacification in the left upper and left  lower lobes. Findings indicate an ongoing infectious/inflammatory  process.  2. Scattered pulmonary nodules, measuring 6 mm less in size. the  patient is at high risk for bronchogenic carcinoma, follow-up chest  CT at 6-12 months is recommended                               .      Marland Kitchen

## 2012-04-17 LAB — IGG, IGA, IGM
IgA: 152 mg/dL (ref 68–379)
IgG (Immunoglobin G), Serum: 795 mg/dL (ref 650–1600)
IgM, Serum: 242 mg/dL (ref 41–251)

## 2012-04-20 LAB — IGG SUBCLASSES
IgG Subclass 1: 406 mg/dL (ref 382–929)
IgG Subclass 2: 113 mg/dL — ABNORMAL LOW (ref 241–700)
IgG Subclass 3: 59 mg/dL (ref 22–178)
IgG Subclass 4: 13.4 mg/dL (ref 4.0–86.0)

## 2012-04-22 ENCOUNTER — Telehealth: Payer: Self-pay | Admitting: Internal Medicine

## 2012-04-22 NOTE — Progress Notes (Signed)
Quick Note:  Called and spoke with patient, made aware of results/recs as listed below per Dr. Sherene Sires. Verbalized understanding of this and nothing further is needed. ______

## 2012-04-22 NOTE — Telephone Encounter (Signed)
Called and spoke with patient informed of results/recs per Dr. Sherene Sires nothing further needed at this time.  Result Note     Call patient : Studies are unremarkable, no change in recs - no Rheumatoid arthritis

## 2012-04-27 ENCOUNTER — Other Ambulatory Visit: Payer: Self-pay | Admitting: Gastroenterology

## 2012-04-30 ENCOUNTER — Encounter: Payer: Self-pay | Admitting: Pulmonary Disease

## 2012-04-30 ENCOUNTER — Ambulatory Visit (INDEPENDENT_AMBULATORY_CARE_PROVIDER_SITE_OTHER): Payer: BC Managed Care – PPO | Admitting: Pulmonary Disease

## 2012-04-30 VITALS — BP 140/98 | HR 105 | Temp 97.5°F | Ht 69.0 in | Wt 174.0 lb

## 2012-04-30 DIAGNOSIS — R894 Abnormal immunological findings in specimens from other organs, systems and tissues: Secondary | ICD-10-CM

## 2012-04-30 DIAGNOSIS — R918 Other nonspecific abnormal finding of lung field: Secondary | ICD-10-CM

## 2012-04-30 DIAGNOSIS — M1990 Unspecified osteoarthritis, unspecified site: Secondary | ICD-10-CM

## 2012-04-30 DIAGNOSIS — R769 Abnormal immunological finding in serum, unspecified: Secondary | ICD-10-CM

## 2012-04-30 DIAGNOSIS — Z8669 Personal history of other diseases of the nervous system and sense organs: Secondary | ICD-10-CM

## 2012-04-30 DIAGNOSIS — R9389 Abnormal findings on diagnostic imaging of other specified body structures: Secondary | ICD-10-CM

## 2012-04-30 DIAGNOSIS — R0602 Shortness of breath: Secondary | ICD-10-CM

## 2012-04-30 DIAGNOSIS — R799 Abnormal finding of blood chemistry, unspecified: Secondary | ICD-10-CM

## 2012-04-30 MED ORDER — ALBUTEROL SULFATE HFA 108 (90 BASE) MCG/ACT IN AERS: 2.0000 | INHALATION_SPRAY | Freq: Four times a day (QID) | RESPIRATORY_TRACT | Status: DC | PRN

## 2012-04-30 NOTE — Patient Instructions (Addendum)
We will schedule a bronchoscopy for first available. Prior to bronchoscopy we will obtain a CXR and blood tests, PT, INR, PTT, BMP and CBC.   I will see you back in 2-3 weeks to discuss the test results and further plan.

## 2012-05-01 ENCOUNTER — Encounter: Payer: Self-pay | Admitting: Pulmonary Disease

## 2012-05-01 NOTE — Progress Notes (Signed)
HPI  46 year old male with PMH of pericarditis one year ago of unknown reason who persistently had CP since. Presented to the cardiologist with atypical chest pain, somewhat pleuritic in nature.  Cath was performed that was normal. CTA of the chest was done that revealed 3 <1 cm pulmonary nodules and multiple areas of GGO.  Patient has been more SOB over the past 3 months. On further enquires he states that he noticed shortness of breath over the past several years, at rest and with exertion, associated with chest tightnes. Extensive cardiology evaluation did not reveal a cardiac etiology. In average reports daily symptoms,  2-3 times a day, waking up approximately 2 nights per month with dyspnea. He used his wife SABA inhaler at home and he noticed relief in his dyspnea.  During hospitalization he was initiated on steroids and is currently on 40 mg po daily. He endorsed improved symptoms with steroids. Denies cough or wheezing, but does states that sometimes when he is short of breath he hears a noise coming from his throat.   04/02/2012 Steroids tapered off. He did not not report a significant difference off steroids.   04/16/2012 Was seen by Dr. Sherene Sires in clinic; CT chest showed persistent lung nodules and ground glass opacities in a different territory distribution. Steroids restarted at 20 mg daily; the patient was instructed to taper them to 10 mg.   04/30/12 present visit  The patient currently on 10 mg of prednisone daily; endorses similar symptoms revealed at previous visit with me. Daily symptoms of shortness of breath and chest tightness, pleuritic with deep breathing and coughing, frequent nighttime awakenings for dyspnea. He reports nocturia, trouble urinating and testicular pain; he has hot flashes and night sweats; no weight loss. Still having palpitations but not as frequent as during hospitalization.   He also has history of GERD; EGD with gastritis and dilated esophagus.   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 No other autoimmune manifestations and no changes in environment. No drug use.  Occupation: toxic exposure to aluminum, steel, plastic dust Travels:no travels abroad TB exposure:denis Pets:none   Past Medical History  Diagnosis Date  . Migraine headache     pt takes inderal for headaches  . Arthritis     s/p neck surgery  . Fibromyalgia   . Depression   . GERD (gastroesophageal reflux disease)   . Testosterone deficiency   . Anxiety   . Complication of anesthesia     " difficult to urinate after "  . Family history of anesthesia complication     mother gets nausea after  . Abnormal chest CT 03/2012    scattered patchy ground glass opacities and pulmonary nodules  . Chest pain     normal coronary arteries and LV fxn by cath 03/2012  . Tobacco abuse    Past Surgical History  Procedure Date  . Fracture surgery left ankle  . Posterior cervical fusion/foraminotomy 05/09/2011    Procedure: POSTERIOR CERVICAL FUSION/FORAMINOTOMY LEVEL 1;  Surgeon: Hewitt Shorts;  Location: [facility];  Service: Neurosurgery;  Laterality: N/A;  C4/5 posterior arthrodesis with instrumentation   . Cystectomy   . Cardiac catheterization 03/25/2012  . Ankle fusion    History  Substance Use Topics  . Smoking status: Former Smoker -- 1.0 packs/day for 27 years    Types: Cigarettes    Quit date: 03/24/2012  . Smokeless tobacco: Former Neurosurgeon  Types: Chew  . Alcohol Use: No   Family History  Problem Relation Age of Onset  . Diabetes Mother   . Coronary artery disease Paternal Uncle   . Asthma Sister      Allergies  Allergen Reactions  . Midodrine Hcl Swelling    Tongue swelling  . Sulfonamide Derivatives Rash        Review of Systems  Constitutional: Negative for diarrhea. Negative for fever.  HENT: Negative for ear pain, congestion, sore throat, sneezing and trouble swallowing.  Negative for dental problem.   Respiratory: Per HPI   Cardiovascular: Per HPI Gastrointestinal: Positive for abdominal pain.  Musculoskeletal: Negative for joint swelling.  Skin: Negative for rash.  Neurological: Positive for numbness.  Psychiatric/Behavioral: Positive for dysphoric mood. The patient is nervous/anxious.    Physical Exam BP 140/98  Pulse 105  Temp 97.5 F (36.4 C) (Oral)  Ht 5\' 9"  (1.753 m)  Wt 174 lb (78.926 kg)  BMI 25.70 kg/m2  SpO2 98%  General: Comfortable HEENT ; pupils round and reactive to light; mild nasal mucosa edema and erythema; orophpharyngeal with no erythema, edema or exudate;  LAD: no cervical LAD  Cardiovascular: s1s2, no murmurs. Regular rate and rhythm.  Respiratory: Decreased breath sounds bilaterally with no wheezing No increased work of breathing.  Abdomen: soft NT ND BS+.   Extremities: no pedal edema. Homans negative  Central nervous system: Alert and oriented No focal neurological deficits.  PFTs 04/02/12         03/26/12 FVC 4.93 101%         5.1 105% (pre) 5.04 104% (post ) FEV1 4.03 102%       4.04 106% (pre) 4.26 111% (post) FEV1/FVC 82              DLCO                         90% TLC                           109% RV/TLC                     112%  MEP                              37% MIP                                69%  Results for orders placed in visit on 04/16/12  SEDIMENTATION RATE      Component Value Range   Sed Rate 17  0 - 22 mm/hr  RHEUMATOID FACTOR      Component Value Range   Rheumatoid Factor <10  <=14 IU/mL     CTA 03/24/2012 No CT evidence for acute pulmonary embolus.  Scattered focal areas ground-glass attenuation bilaterally with an  upper lobe and slightly peripheral predominance. This may be  secondary to an infectious or inflammatory alveolitis. Cryptogenic  pneumonia would be a consideration. The density of the patchy  opacities is not as confluent is typically seen for septic emboli.    Follow-up CT chest without contrast in 3 months is recommended to  determine whether these findings persist.  Bilateral pulmonary parenchymal nodules measuring up to 6.2 mm. If  the patient is at high risk  for bronchogenic carcinoma, follow-up  chest CT at 6-12 months is recommended. If the patient is at low  risk for bronchogenic carcinoma, follow-up chest CT at 12 months is  recommended. This recommendation follows the consensus statement:  Guidelines for Management of Small Pulmonary Nodules Detected on CT  Scans: A Statement from the Fleischner Society as published in  Radiology 2005; 237:395-400.    SPEP note The possibility of a faint restricted band(s) cannot be completely excluded in the gamma region. Suggest serum IFE to evaluate possibility, if clinically indicated.   A/P GGO the patient highly worried that this might be malignancy; I suspect infection vs either a component of eosinophilic pneumonia vs organizing pneumonia vs occupational  disease.  The association with small lung nodules and dyspnea makes me consider eosinophilic infiltrates. The symptoms clearly responded to steroids, however now on 10 mg he reports increased symptoms.  We will schedule bronchoscopy with BAL with cell count/differential, microbiology plus minus transbronchial biopsy.   Lung nodules 3-6.2 mm. For the lung nodules we will obtain a repeat CT chest in 3 months. He will need CT surveillance for 2 years to assess stability.  Etiology unclear. See the other problems. Autoimmune work up negative.   Chronic dyspnea and chest tightness;  I suspect a component of asthma (200cc improvement in FEV1 with bronchodilators)  especially since he seems to see a benefit from bronchodialtors and prednisone. I advised him to start using Albuterol inhaler as needed for shortness of breath.  He would benefit from methacoline challenge.  Occupational toxic exposure.  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 Aluminum dust exposure could give nodular lung disease/chronic dyspnea.  Aluminum plasma and urine level measurement ordered.   IgG slightly low. Will repeat and also obtain IgG subclasses with next visit. Suspect they are low from steroid use.   Smoking cessation counseling provided in clinic. He quit 03/24/2012.   Abnormal SPEP and family history of amyloidosis. We will repeat SPEP, if persistently abnormal we will proceed to IFE.   Testosterone deficiency With lung nodules one of the concerns is that of malignancy. In a young man testicular cancer is in the differential. He is seeing a urologist in town, so I recommend close follow up with him for testicular problems and r/o testicular malignancy.   GERD continue dexilant.   Decreased MIP and MEP I am unclear how to interpret this result since the PFTs are impressively good. I will consider repeating them in the future. It is possible that he has a component of diaphragmatic weaknesses given h/o C4-C5 surgery. I am unclear if contributory to his dyspnea. I would not pursue further diagnostic testing for this condition until asthma/occupational disease r/u as above.   Flow volume loops with possible variable extrathoracic upper airway obstruction. More likely effort related, however with any future  Concerns we will consider ENT evaluation. I will olso evaluate the trachea and upper airway with bronchoscopy.     The patient is young with complex PMH. I spend more than 45 min with him in clinic to discuss current findings and future plan.

## 2012-05-03 ENCOUNTER — Encounter: Payer: Self-pay | Admitting: Pulmonary Disease

## 2012-05-03 DIAGNOSIS — R769 Abnormal immunological finding in serum, unspecified: Secondary | ICD-10-CM | POA: Insufficient documentation

## 2012-05-05 ENCOUNTER — Encounter (HOSPITAL_COMMUNITY): Payer: Self-pay | Admitting: Radiology

## 2012-05-05 ENCOUNTER — Ambulatory Visit (INDEPENDENT_AMBULATORY_CARE_PROVIDER_SITE_OTHER)
Admission: RE | Admit: 2012-05-05 | Discharge: 2012-05-05 | Disposition: A | Discharge status: Home / Self Care | Payer: BC Managed Care – PPO | Source: Ambulatory Visit | Attending: Pulmonary Disease | Admitting: Pulmonary Disease

## 2012-05-05 ENCOUNTER — Other Ambulatory Visit (INDEPENDENT_AMBULATORY_CARE_PROVIDER_SITE_OTHER): Payer: BC Managed Care – PPO

## 2012-05-05 DIAGNOSIS — R9389 Abnormal findings on diagnostic imaging of other specified body structures: Secondary | ICD-10-CM

## 2012-05-05 DIAGNOSIS — R0602 Shortness of breath: Secondary | ICD-10-CM

## 2012-05-05 LAB — CBC WITH DIFFERENTIAL/PLATELET
Basophils Absolute: 0 10*3/uL (ref 0.0–0.1)
Basophils Relative: 0.5 % (ref 0.0–3.0)
Eosinophils Absolute: 0 10*3/uL (ref 0.0–0.7)
Eosinophils Relative: 0.5 % (ref 0.0–5.0)
HCT: 44.1 % (ref 39.0–52.0)
Hemoglobin: 14.6 g/dL (ref 13.0–17.0)
Lymphocytes Relative: 28.3 % (ref 12.0–46.0)
Lymphs Abs: 2 10*3/uL (ref 0.7–4.0)
MCHC: 33.2 g/dL (ref 30.0–36.0)
MCV: 93.3 fl (ref 78.0–100.0)
Monocytes Absolute: 0.5 10*3/uL (ref 0.1–1.0)
Monocytes Relative: 7.7 % (ref 3.0–12.0)
Neutro Abs: 4.5 10*3/uL (ref 1.4–7.7)
Neutrophils Relative %: 63 % (ref 43.0–77.0)
Platelets: 175 10*3/uL (ref 150.0–400.0)
RBC: 4.73 Mil/uL (ref 4.22–5.81)
RDW: 13.3 % (ref 11.5–14.6)
WBC: 7.1 10*3/uL (ref 4.5–10.5)

## 2012-05-05 LAB — PROTIME-INR
INR: 1.1 ratio — ABNORMAL HIGH (ref 0.8–1.0)
Prothrombin Time: 11.7 s (ref 10.2–12.4)

## 2012-05-05 LAB — APTT: aPTT: 25.8 s (ref 21.7–28.8)

## 2012-05-05 LAB — BRAIN NATRIURETIC PEPTIDE: Pro B Natriuretic peptide (BNP): 4 pg/mL (ref 0.0–100.0)

## 2012-05-06 ENCOUNTER — Ambulatory Visit (HOSPITAL_COMMUNITY): Payer: BC Managed Care – PPO

## 2012-05-06 ENCOUNTER — Encounter (HOSPITAL_COMMUNITY): Payer: Self-pay | Admitting: Respiratory Therapy

## 2012-05-06 ENCOUNTER — Encounter (HOSPITAL_COMMUNITY)
Admission: RE | Disposition: A | Discharge status: Home / Self Care | Payer: Self-pay | Source: Ambulatory Visit | Attending: Pulmonary Disease

## 2012-05-06 ENCOUNTER — Ambulatory Visit (HOSPITAL_COMMUNITY)
Admission: RE | Admit: 2012-05-06 | Discharge: 2012-05-06 | Disposition: A | Discharge status: Home / Self Care | Payer: BC Managed Care – PPO | Source: Ambulatory Visit | Attending: Pulmonary Disease | Admitting: Pulmonary Disease

## 2012-05-06 DIAGNOSIS — R911 Solitary pulmonary nodule: Secondary | ICD-10-CM

## 2012-05-06 DIAGNOSIS — R0789 Other chest pain: Secondary | ICD-10-CM | POA: Insufficient documentation

## 2012-05-06 DIAGNOSIS — R918 Other nonspecific abnormal finding of lung field: Secondary | ICD-10-CM | POA: Insufficient documentation

## 2012-05-06 DIAGNOSIS — R9389 Abnormal findings on diagnostic imaging of other specified body structures: Secondary | ICD-10-CM

## 2012-05-06 DIAGNOSIS — J8410 Pulmonary fibrosis, unspecified: Secondary | ICD-10-CM | POA: Insufficient documentation

## 2012-05-06 DIAGNOSIS — R0602 Shortness of breath: Secondary | ICD-10-CM | POA: Insufficient documentation

## 2012-05-06 HISTORY — PX: VIDEO BRONCHOSCOPY: SHX5072

## 2012-05-06 LAB — BODY FLUID CELL COUNT WITH DIFFERENTIAL
Eos, Fluid: 0 %
Lymphs, Fluid: 5 %
Monocyte-Macrophage-Serous Fluid: 95 % — ABNORMAL HIGH (ref 50–90)
Neutrophil Count, Fluid: 0 % (ref 0–25)
Other Cells, Fluid: 0 %
Total Nucleated Cell Count, Fluid: 734 cu mm (ref 0–1000)

## 2012-05-06 SURGERY — BRONCHOSCOPY, WITH FLUOROSCOPY
Anesthesia: Moderate Sedation | Laterality: Bilateral | Wound class: Clean Contaminated

## 2012-05-06 MED ORDER — MIDAZOLAM HCL 10 MG/2ML IJ SOLN
INTRAMUSCULAR | Status: DC | PRN
  Administered 2012-05-06 (×2): 1 mg via INTRAVENOUS
  Administered 2012-05-06 (×2): 2 mg via INTRAVENOUS

## 2012-05-06 MED ORDER — FENTANYL CITRATE 0.05 MG/ML IJ SOLN
INTRAMUSCULAR | Status: AC
  Filled 2012-05-06: qty 4

## 2012-05-06 MED ORDER — SODIUM CHLORIDE 0.9 % IV SOLN
INTRAVENOUS | Status: DC
  Administered 2012-05-06: 08:00:00 via INTRAVENOUS

## 2012-05-06 MED ORDER — FENTANYL CITRATE 0.05 MG/ML IJ SOLN
INTRAMUSCULAR | Status: DC | PRN
  Administered 2012-05-06 (×2): 50 ug via INTRAVENOUS
  Administered 2012-05-06 (×2): 25 ug via INTRAVENOUS

## 2012-05-06 MED ORDER — MORPHINE SULFATE 2 MG/ML IJ SOLN
0.5000 mg | Freq: Once | INTRAMUSCULAR | Status: AC
  Administered 2012-05-06: 0.5 mg via INTRAVENOUS

## 2012-05-06 MED ORDER — MORPHINE SULFATE 2 MG/ML IJ SOLN
INTRAMUSCULAR | Status: AC
  Filled 2012-05-06: qty 1

## 2012-05-06 MED ORDER — LIDOCAINE HCL 1 % IJ SOLN
INTRAMUSCULAR | Status: DC | PRN
  Administered 2012-05-06: 6 mL

## 2012-05-06 MED ORDER — MIDAZOLAM HCL 10 MG/2ML IJ SOLN
INTRAMUSCULAR | Status: AC
  Filled 2012-05-06: qty 4

## 2012-05-06 NOTE — Progress Notes (Signed)
Video Bronchoscopy done  Intervention  Bronchial washing Intervention Bronchial Biopsy  Procedure tolerated well

## 2012-05-06 NOTE — H&P (Signed)
  H&P reviewwed. The patient was seen in clinic 04/30/2012. The patient complains of chronic chest pain, shortness of breath and denies other acute problems.

## 2012-05-06 NOTE — Op Note (Signed)
Consent Obtained. Risks and benefits of the procedure explained to the patient and patient's wife.   Reason for the procedure: LUL and lingula ground glass opacity bronchus concerning for organizing pneumonia/eosinophilic pneumonia vs atypical infection.   Physical exam  BP 134/84  Pulse 74  Resp 15  Ht 5\' 9"  (1.753 m)  Wt 175 lb (79.379 kg)  BMI 25.84 kg/m2  SpO2 99%  General: Comfortable. Thin  HEENT ; pupils round and reactive to light;  Oropharynx  with no erythema, edema or exudate; Mallampati 1 LAD no cervical LAD  Cardiovascular: s1s2, no murmurs. Regular rate and rhythm.  Respiratory:Clear to auscultation No increased work of breathing.  Abdomen: soft NT ND BS+.   Extremities: no pedal edema. Homans negative  Central nervous system: Alert and oriented No focal neurological deficits.  ASA class II: the patient with moderate risk of cardiopulmonary complications due to this procedure.   Labs reviewed: PT, INR, PTT, Cr, K, Platelets and CXR  The patient received moderate sedation: Fentanyl 150 mcg and Versed 6 mg IV administered by nursing staff. Lidocaine above the cords 6 cc 1%, lidocaine 9 cc 1%.  The patient was on continuous cardiopulmonary monitoring prior, during and post procedure.   Airway Inspection: The bronchoscope was introduced through the mouth. Vocal cords, larynx, trachea WNL. The airway was examined to subsegmental level.The airway was normal with no mucosal abnormalities, no secretions and no endobronchial lesions.    Interventions: Bronchial wash was was performed in the lingula. 90 cc were instilled. 36 cc cloudy and slightly dark were obtained and sent for AFB, fungus culture, respiratory viral panel and bacterial culture, PCP PCR, cell count and differential and cytology.   Transbronchial biopsy was performed. 4 passes performed and 3 biopsy samples obtained and sent to pathology.    Complications. There were no complications. Estimated blood loss  minimal.   The patient was discharged to the post-operative observation in stable condition.   I personally performed the entire procedure.   Recommendations:  Please call us with increased chest pain, shortness of breath or bleeding. If coughing up blood start measuring in a cup and call if the blood amount is larger than 3 small teaspoons.  We will follow up on cytology results and cultures.  We will call you with the results.   Vanetta Mulders, MD  [facility]  [place], [place] [phone]

## 2012-05-07 ENCOUNTER — Encounter (HOSPITAL_COMMUNITY): Payer: Self-pay

## 2012-05-07 ENCOUNTER — Encounter (HOSPITAL_COMMUNITY): Payer: Self-pay | Admitting: Pulmonary Disease

## 2012-05-07 ENCOUNTER — Telehealth: Payer: Self-pay | Admitting: Pulmonary Disease

## 2012-05-07 LAB — RESPIRATORY VIRUS PANEL
Adenovirus: NOT DETECTED
Influenza A H1: NOT DETECTED
Influenza A H3: NOT DETECTED
Influenza A: NOT DETECTED
Influenza B: NOT DETECTED
Metapneumovirus: NOT DETECTED
Parainfluenza 1: NOT DETECTED
Parainfluenza 2: NOT DETECTED
Parainfluenza 3: NOT DETECTED
Respiratory Syncytial Virus A: NOT DETECTED
Respiratory Syncytial Virus B: NOT DETECTED
Rhinovirus: NOT DETECTED

## 2012-05-07 LAB — PNEUMOCYSTIS JIROVECI SMEAR BY DFA: Pneumocystis jiroveci Ag: NEGATIVE

## 2012-05-07 NOTE — Telephone Encounter (Signed)
Spoke with pt. He states unsure if he is continue taking prednisone 10 mg daily or not. Dr. Sherene Sires had started him out at 20 mg and then he tapered down to 10 mg daily. Please advise if he should continue med or d/c. Thanks!

## 2012-05-08 ENCOUNTER — Telehealth: Payer: Self-pay | Admitting: Pulmonary Disease

## 2012-05-08 NOTE — Telephone Encounter (Signed)
Called, spoke with pt.  Informed him of below per Dr. Frederico Hamman.  He verbalized understanding and voiced no further questions/concerns at this time.

## 2012-05-08 NOTE — Telephone Encounter (Signed)
lmomtcb for the pt.  

## 2012-05-08 NOTE — Telephone Encounter (Signed)
The patient needs to continue prednisone at 10 mg daily.

## 2012-05-08 NOTE — Telephone Encounter (Signed)
Called, spoke with pt.  Informed him of below per Dr. Craige Cotta.  He verbalized understanding of this and is aware we will call back with Dr. Liliane Channel further recs.  Dr. Frederico Hamman, pls advise.  Thank you.

## 2012-05-08 NOTE — Telephone Encounter (Signed)
Please have him continue prednisone 10 mg daily for now.  Will route phone note to Dr. Frederico Hamman so she can address further.

## 2012-05-08 NOTE — Telephone Encounter (Signed)
Called, spoke with pt.  He is calling to check on the status of the prednisone question.  States he was told he would receive a call back yesterday but didn't.  As Dr. Frederico Hamman is listed as 11 pm Elink tonight, will route msg to doc of the day.  Dr. Craige Cotta, can you pls advise.  Thank you.

## 2012-05-08 NOTE — Telephone Encounter (Signed)
Results reviewed     BAL cell count with elevated macrophages (possible COP, Hypersensitivity pneumonitis vs chronic microaspiration, possible Aluminum exposure)    RVP neg    Culture, fungus and AFB pending     Cytology/pathology benign       I talked to the patient and explained the results. We will discucss further plan with the next visit.

## 2012-05-08 NOTE — Telephone Encounter (Signed)
Patient calling back about prednisone.  [phone]

## 2012-05-08 NOTE — Telephone Encounter (Signed)
Pt returned call. Richard Franco  

## 2012-05-09 LAB — CULTURE, RESPIRATORY

## 2012-05-09 LAB — CULTURE, RESPIRATORY W GRAM STAIN

## 2012-05-15 ENCOUNTER — Ambulatory Visit (INDEPENDENT_AMBULATORY_CARE_PROVIDER_SITE_OTHER): Payer: BC Managed Care – PPO | Admitting: Pulmonary Disease

## 2012-05-15 ENCOUNTER — Encounter: Payer: Self-pay | Admitting: Pulmonary Disease

## 2012-05-15 VITALS — BP 112/82 | HR 77 | Temp 97.8°F | Ht 69.0 in | Wt 179.0 lb

## 2012-05-15 DIAGNOSIS — R918 Other nonspecific abnormal finding of lung field: Secondary | ICD-10-CM

## 2012-05-15 DIAGNOSIS — R0602 Shortness of breath: Secondary | ICD-10-CM

## 2012-05-15 DIAGNOSIS — R911 Solitary pulmonary nodule: Secondary | ICD-10-CM

## 2012-05-15 DIAGNOSIS — R9389 Abnormal findings on diagnostic imaging of other specified body structures: Secondary | ICD-10-CM

## 2012-05-15 DIAGNOSIS — R1310 Dysphagia, unspecified: Secondary | ICD-10-CM

## 2012-05-15 MED ORDER — FLUTICASONE PROPIONATE HFA 110 MCG/ACT IN AERO: 1.0000 | INHALATION_SPRAY | Freq: Two times a day (BID) | RESPIRATORY_TRACT | Status: DC

## 2012-05-15 NOTE — Progress Notes (Signed)
HPI  46 year old male with PMH of pericarditis one year ago of unknown reason who persistently had CP since. Presented to the cardiologist with atypical chest pain, somewhat pleuritic in nature.  Cath was performed that was normal. CTA of the chest was done that revealed 3 <1 cm pulmonary nodules and multiple areas of GGO.  Patient has been more SOB over the past 3 months. On further enquires he states that he noticed shortness of breath over the past several years, at rest and with exertion, associated with chest tightnes. Extensive cardiology evaluation did not reveal a cardiac etiology. In average reports daily symptoms,  2-3 times a day, waking up approximately 2 nights per month with dyspnea. He used his wife SABA inhaler at home and he noticed relief in his dyspnea.  During hospitalization he was initiated on steroids. He endorsed improved symptoms with steroids. Denies cough or wheezing, but does states that sometimes when he is short of breath he hears a noise coming from his throat.   04/02/2012 Steroids tapered off. He did not not report a significant difference off steroids.   04/16/2012 Was seen by Dr. Sherene Sires in clinic; CT chest showed persistent lung nodules and ground glass opacities in a different territory distribution. Steroids restarted at 20 mg daily; the patient was instructed to taper them to 10 mg.   04/30/12 The patient on 10 mg of prednisone daily;   05/06/12 Bronchoscopy with BAL, cell count and TBBx of the lingula  BAL cell count with elevated macrophages (possible COP, Hypersensitivity pneumonitis vs chronic microaspiration, possible Aluminum exposure)  RVP neg Viral culture neg  AFB smear neg, culture pending  Cytology/pathology benign with no evidence of aspiration or ILD    05/15/12  present visit  Endorses similar symptoms revealed at previous visit with me. Daily symptoms of shortness of breath and chest tightness, pleuritic with deep breathing and coughing, frequent  nighttime awakenings for dyspnea.   He reports nocturia, trouble urinating and testicular pain; he has hot flashes and night sweats; no weight loss. Still having palpitations but not as frequent as during hospitalization. Was seen by urology.   He also has history of GERD; EGD with gastritis and dilated esophagus.   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 No other autoimmune manifestations and no changes in environment. No drug use.  Occupation: toxic exposure to aluminum, steel, plastic dust Travels:no travels abroad TB exposure:denis Pets:none   Past Medical History  Diagnosis Date  . Migraine headache     pt takes inderal for headaches  . Arthritis     s/p neck surgery  . Fibromyalgia   . Depression   . GERD (gastroesophageal reflux disease)   . Testosterone deficiency   . Anxiety   . Complication of anesthesia     " difficult to urinate after "  . Family history of anesthesia complication     mother gets nausea after  . Abnormal chest CT 03/2012    scattered patchy ground glass opacities and pulmonary nodules  . Chest pain     normal coronary arteries and LV fxn by cath 03/2012  . Tobacco abuse    Past Surgical History  Procedure Date  . Fracture surgery left ankle  . Posterior cervical fusion/foraminotomy 05/09/2011    Procedure: POSTERIOR CERVICAL FUSION/FORAMINOTOMY LEVEL 1;  Surgeon: Hewitt Shorts;  Location: [facility];  Service: Neurosurgery;  Laterality: N/A;  C4/5 posterior arthrodesis with instrumentation   .  Cystectomy   . Cardiac catheterization 03/25/2012  . Ankle fusion   . Video bronchoscopy 05/06/2012    Procedure: VIDEO BRONCHOSCOPY WITH FLUORO;  Surgeon: Roxine Caddy, MD;  Location: [facility];  Service: Cardiopulmonary;  Laterality: Bilateral;   History  Substance Use Topics  . Smoking status: Former Smoker -- 1.0 packs/day for 27 years    Types: Cigarettes    Quit  date: 03/24/2012  . Smokeless tobacco: Former Neurosurgeon    Types: Chew  . Alcohol Use: No   Family History  Problem Relation Age of Onset  . Diabetes Mother   . Coronary artery disease Paternal Uncle   . Asthma Sister      Allergies  Allergen Reactions  . Midodrine Hcl Swelling    Tongue swelling  . Sulfonamide Derivatives Rash        Review of Systems  Constitutional: Negative for diarrhea. Negative for fever.  HENT: Negative for ear pain, congestion, sore throat, sneezing and trouble swallowing. Negative for dental problem.   Respiratory: Per HPI   Cardiovascular: Per HPI Gastrointestinal: Positive for abdominal pain.  Musculoskeletal: Negative for joint swelling.  Skin: Negative for rash.  Neurological: Positive for numbness.  Psychiatric/Behavioral: Positive for dysphoric mood. The patient is nervous/anxious.    Physical Exam BP 112/82  Pulse 77  Temp 97.8 F (36.6 C) (Oral)  Ht 5\' 9"  (1.753 m)  Wt 179 lb (81.194 kg)  BMI 26.43 kg/m2  SpO2 96%  General: Comfortable HEENT ; pupils round and reactive to light; mild nasal mucosa edema and erythema; orophpharyngeal with no erythema, edema or exudate;  LAD: no cervical LAD  Cardiovascular: s1s2, no murmurs. Regular rate and rhythm.  Respiratory: Decreased breath sounds bilaterally with no wheezing No increased work of breathing.  Abdomen: soft NT ND BS+.   Extremities: no pedal edema. Homans negative  Central nervous system: Alert and oriented No focal neurological deficits.  PFTs 04/02/12         03/26/12 FVC 4.93 101%         5.1 105% (pre) 5.04 104% (post ) FEV1 4.03 102%       4.04 106% (pre) 4.26 111% (post) FEV1/FVC 82              DLCO                         90% TLC                           109% RV/TLC                     112%  MEP                              37% MIP                                69%  Results for orders placed during the hospital encounter of 05/06/12  CULTURE, RESPIRATORY       Component Value Range   Specimen Description BRONCHIAL ALVEOLAR LAVAGE     Special Requests NONE     Gram Stain       Value: RARE WBC PRESENT, PREDOMINANTLY MONONUCLEAR     NO SQUAMOUS EPITHELIAL CELLS SEEN     NO ORGANISMS SEEN  Culture Non-Pathogenic Oropharyngeal-type Flora Isolated.     Report Status 05/09/2012 FINAL    RESPIRATORY VIRUS PANEL      Component Value Range   Source - [facility]AN BRONCHIAL ALVEOLAR LAVAGE     Respiratory Syncytial Virus A NOT DETECTED     Respiratory Syncytial Virus B NOT DETECTED     Influenza A NOT DETECTED     Influenza B NOT DETECTED     Parainfluenza 1 NOT DETECTED     Parainfluenza 2 NOT DETECTED     Parainfluenza 3 NOT DETECTED     Metapneumovirus NOT DETECTED     Rhinovirus NOT DETECTED     Adenovirus NOT DETECTED     Influenza A H1 NOT DETECTED     Influenza A H3 NOT DETECTED    PNEUMOCYSTIS JIROVECI SMEAR BY DFA      Component Value Range   Specimen Source-[facility] BRONCHIAL ALVEOLAR LAVAGE     Pneumocystis jiroveci Ag NEGATIVE    AFB CULTURE WITH SMEAR      Component Value Range   Specimen Description BRONCHIAL ALVEOLAR LAVAGE     Special Requests NONE     ACID FAST SMEAR NO ACID FAST BACILLI SEEN     Culture       Value: CULTURE WILL BE EXAMINED FOR 6 WEEKS BEFORE ISSUING A FINAL REPORT   Report Status PENDING    FUNGUS CULTURE W SMEAR      Component Value Range   Specimen Description BRONCHIAL ALVEOLAR LAVAGE     Special Requests NONE     Fungal Smear NO YEAST OR FUNGAL ELEMENTS SEEN     Culture CANDIDA GLABRATA     Report Status PENDING    BODY FLUID CELL COUNT WITH DIFFERENTIAL      Component Value Range   Fluid Type-FCT Body Fluid     Color, Fluid COLORLESS (*) YELLOW   Appearance, Fluid CLOUDY (*) CLEAR   WBC, Fluid 734  0 - 1000 cu mm   Neutrophil Count, Fluid 0  0 - 25 %   Lymphs, Fluid 5     Monocyte-Macrophage-Serous Fluid 95 (*) 50 - 90 %   Eos, Fluid 0     Other Cells, Fluid 0    VIRAL CULTURE VIRC      Component  Value Range   Specimen Description BRONCHIAL ALVEOLAR LAVAGE     Special Requests NONE     Culture Culture has been initiated.     Report Status PENDING       CTA 03/24/2012 No CT evidence for acute pulmonary embolus.  Scattered focal areas ground-glass attenuation bilaterally with an  upper lobe and slightly peripheral predominance. This may be  secondary to an infectious or inflammatory alveolitis. Cryptogenic  pneumonia would be a consideration. The density of the patchy  opacities is not as confluent is typically seen for septic emboli.  Follow-up CT chest without contrast in 3 months is recommended to  determine whether these findings persist.  Bilateral pulmonary parenchymal nodules measuring up to 6.2 mm. If  the patient is at high risk for bronchogenic carcinoma, follow-up  chest CT at 6-12 months is recommended. If the patient is at low  risk for bronchogenic carcinoma, follow-up chest CT at 12 months is  recommended. This recommendation follows the consensus statement:  Guidelines for Management of Small Pulmonary Nodules Detected on CT  Scans: A Statement from the Fleischner Society as published in  Radiology 2005; 237:395-400.    SPEP note The possibility of  a faint restricted band(s) cannot be completely excluded in the gamma region.  IFE normal    A/P GGO the patient highly worried that this might be malignancy; I suspect infection vs either a component of eosinophilic pneumonia vs organizing pneumonia vs occupational  Disease vs microaspiration. Unfortunately TBBx did not reveal an etiology; Microbiology so far negative.  I advised him to continue 10 mg prednisone. I will see him back in 2 months with repeat CT scna; if persistent GGO at that time I will consider VATS.   Lung nodules 3-6.2 mm. For the lung nodules we will obtain a repeat CT chest in 3 months with next visit.Marland Kitchen He will need CT surveillance for 2 years to assess stability.  Etiology unclear. See the  other problems. Autoimmune work up negative.   Chronic dyspnea and chest tightness;  I suspect a component of asthma (200cc improvement in FEV1 with bronchodilators)  especially since he seems to see a benefit from bronchodialtors and prednisone. I advised him to start using Flovent and continue Albuterol inhaler as needed for shortness of breath.  We will obtain a  methacoline challenge.  Occupational toxic exposure.  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 Aluminum dust exposure could give nodular lung disease/chronic dyspnea.  Aluminum plasma and urine level measurement ordered but still pending.   IgG slightly low. Will repeat and also obtain IgG subclasses with next visit. Suspect they are low from steroid use. Pending   Smoking cessation counseling provided in clinic. He quit 03/24/2012.   Abnormal SPEP and family history of amyloidosis. SPEP,  IFE normal.   Testosterone deficiency Was seen by urology; defer further work up to them.   GERD continue dexilant. We will obtain a barium swallow and consider in the future a 24H pH probe with manometry. I am concerned of microaspiration contributing to his presentation.   Decreased MIP and MEP I am unclear how to interpret this result since the PFTs are impressively good. I will consider repeating them in the future. It is possible that he has a component of diaphragmatic weaknesses given h/o C4-C5 surgery. I am unclear if contributory to his dyspnea. I would not pursue further diagnostic testing for this condition until asthma/occupational disease r/u as above.       The patient is young with complex PMH.

## 2012-05-15 NOTE — Patient Instructions (Addendum)
We will obtain a barium swallow, methacoline challenge before next visit.   Please initiate Flovent twice daily.   We will see you back in January. At that time we will obtain a CT chest ILD protocol to follow up on lung nodules and ground glass abnormalities.   In the interim please call with any questions or changes in the health status.

## 2012-05-18 ENCOUNTER — Encounter: Payer: Self-pay | Admitting: Pulmonary Disease

## 2012-05-18 LAB — VIRAL CULTURE VIRC

## 2012-05-20 ENCOUNTER — Ambulatory Visit (HOSPITAL_COMMUNITY): Admission: RE | Admit: 2012-05-20 | Payer: BC Managed Care – PPO | Source: Ambulatory Visit

## 2012-05-20 ENCOUNTER — Inpatient Hospital Stay (HOSPITAL_COMMUNITY): Admission: RE | Admit: 2012-05-20 | Payer: BC Managed Care – PPO | Source: Ambulatory Visit

## 2012-05-25 ENCOUNTER — Other Ambulatory Visit: Payer: Self-pay | Admitting: Pulmonary Disease

## 2012-05-25 ENCOUNTER — Ambulatory Visit (HOSPITAL_COMMUNITY)
Admission: RE | Admit: 2012-05-25 | Discharge: 2012-05-25 | Disposition: A | Discharge status: Home / Self Care | Payer: BC Managed Care – PPO | Source: Ambulatory Visit | Attending: Pulmonary Disease | Admitting: Pulmonary Disease

## 2012-05-25 DIAGNOSIS — R1310 Dysphagia, unspecified: Secondary | ICD-10-CM

## 2012-05-25 DIAGNOSIS — R0602 Shortness of breath: Secondary | ICD-10-CM | POA: Insufficient documentation

## 2012-05-25 MED ORDER — METHACHOLINE 4 MG/ML NEB SOLN
2.0000 mL | Freq: Once | RESPIRATORY_TRACT | Status: AC
  Administered 2012-05-25: 8 mg via RESPIRATORY_TRACT

## 2012-05-25 MED ORDER — ALBUTEROL SULFATE (5 MG/ML) 0.5% IN NEBU
2.5000 mg | INHALATION_SOLUTION | Freq: Once | RESPIRATORY_TRACT | Status: AC
  Administered 2012-05-25: 2.5 mg via RESPIRATORY_TRACT

## 2012-05-25 MED ORDER — METHACHOLINE 16 MG/ML NEB SOLN
2.0000 mL | Freq: Once | RESPIRATORY_TRACT | Status: AC
  Administered 2012-05-25: 32 mg via RESPIRATORY_TRACT

## 2012-05-25 MED ORDER — METHACHOLINE 0.0625 MG/ML NEB SOLN
2.0000 mL | Freq: Once | RESPIRATORY_TRACT | Status: AC
  Administered 2012-05-25: 0.125 mg via RESPIRATORY_TRACT

## 2012-05-25 MED ORDER — METHACHOLINE 1 MG/ML NEB SOLN
2.0000 mL | Freq: Once | RESPIRATORY_TRACT | Status: AC
  Administered 2012-05-25: 2 mg via RESPIRATORY_TRACT

## 2012-05-25 MED ORDER — SODIUM CHLORIDE 0.9 % IN NEBU
3.0000 mL | INHALATION_SOLUTION | Freq: Once | RESPIRATORY_TRACT | Status: AC
  Administered 2012-05-25: 3 mL via RESPIRATORY_TRACT

## 2012-05-25 MED ORDER — METHACHOLINE 0.25 MG/ML NEB SOLN
2.0000 mL | Freq: Once | RESPIRATORY_TRACT | Status: AC
  Administered 2012-05-25: 0.5 mg via RESPIRATORY_TRACT

## 2012-05-26 ENCOUNTER — Telehealth: Payer: Self-pay | Admitting: Pulmonary Disease

## 2012-05-26 ENCOUNTER — Ambulatory Visit (HOSPITAL_COMMUNITY): Admission: RE | Admit: 2012-05-26 | Payer: BC Managed Care – PPO | Source: Ambulatory Visit

## 2012-05-26 ENCOUNTER — Ambulatory Visit (HOSPITAL_COMMUNITY)
Admission: RE | Admit: 2012-05-26 | Discharge: 2012-05-26 | Disposition: A | Discharge status: Home / Self Care | Payer: BC Managed Care – PPO | Source: Ambulatory Visit | Attending: Pulmonary Disease | Admitting: Pulmonary Disease

## 2012-05-26 DIAGNOSIS — R1310 Dysphagia, unspecified: Secondary | ICD-10-CM | POA: Insufficient documentation

## 2012-05-26 NOTE — Telephone Encounter (Signed)
I spoke with pt and advised him he is set up for DG esophagus as ordered. He is aware this is for any possible obstruction/aspiration. Nothing further was needed

## 2012-06-01 LAB — FUNGUS CULTURE W SMEAR: Fungal Smear: NONE SEEN

## 2012-06-19 ENCOUNTER — Telehealth: Payer: Self-pay | Admitting: Pulmonary Disease

## 2012-06-19 LAB — AFB CULTURE WITH SMEAR (NOT AT ARMC): Acid Fast Smear: NONE SEEN

## 2012-06-19 NOTE — Telephone Encounter (Signed)
Pre authorization no for CT chest without contrast : [id number]

## 2012-06-22 ENCOUNTER — Ambulatory Visit (INDEPENDENT_AMBULATORY_CARE_PROVIDER_SITE_OTHER)
Admission: RE | Admit: 2012-06-22 | Discharge: 2012-06-22 | Disposition: A | Discharge status: Home / Self Care | Payer: BC Managed Care – PPO | Source: Ambulatory Visit | Attending: Pulmonary Disease | Admitting: Pulmonary Disease

## 2012-06-22 DIAGNOSIS — R918 Other nonspecific abnormal finding of lung field: Secondary | ICD-10-CM

## 2012-06-22 DIAGNOSIS — R911 Solitary pulmonary nodule: Secondary | ICD-10-CM

## 2012-06-22 NOTE — Telephone Encounter (Signed)
[facility] do I need to do anything with this. Please advise thanks

## 2012-06-22 NOTE — Telephone Encounter (Signed)
Nothing needs to be done thanks Tobe Sos

## 2012-06-25 ENCOUNTER — Other Ambulatory Visit: Payer: BC Managed Care – PPO

## 2012-07-01 ENCOUNTER — Ambulatory Visit (INDEPENDENT_AMBULATORY_CARE_PROVIDER_SITE_OTHER): Payer: BC Managed Care – PPO | Admitting: Pulmonary Disease

## 2012-07-01 ENCOUNTER — Encounter: Payer: Self-pay | Admitting: Pulmonary Disease

## 2012-07-01 VITALS — BP 130/72 | HR 94 | Temp 97.1°F | Ht 68.0 in | Wt 183.4 lb

## 2012-07-01 DIAGNOSIS — R918 Other nonspecific abnormal finding of lung field: Secondary | ICD-10-CM

## 2012-07-01 MED ORDER — PREDNISONE (PAK) 5 MG PO TABS: 5.0000 mg | ORAL_TABLET | Freq: Every day | ORAL | Status: DC

## 2012-07-01 NOTE — Patient Instructions (Addendum)
Decrease prednisone to 7.5 mg (a tablet and a half for 2 weeks) and then to 5 mg daily for 3 months.   Stop the Flovent.   We will see you back in 3 months. We will repeat a CT scan at that time to follow up on the lung nodules.

## 2012-07-01 NOTE — Progress Notes (Signed)
CC: shortness of breath with exertion   HPI  47 year old male with PMH of pericarditis one year ago of unknown reason who persistently had CP since. Presented to the cardiologist with atypical chest pain, somewhat pleuritic in nature.  Cath was performed that was normal. CTA of the chest was done that revealed 3 <1 cm pulmonary nodules and multiple areas of GGO.  Patient has been more SOB over the past 3 months. On further enquires he states that he noticed shortness of breath over the past several years, at rest and with exertion, associated with chest tightnes. Extensive cardiology evaluation did not reveal a cardiac etiology. In average reports daily symptoms,  2-3 times a day, waking up approximately 2 nights per month with dyspnea.  During hospitalization he was initiated on steroids. He endorsed improved symptoms with steroids. Denies cough or wheezing, but does states that sometimes when he is short of breath he hears a noise coming from his throat.   04/02/2012 Steroids tapered off. He did not not report a significant difference off steroids.   04/16/2012 Was seen by Dr. Sherene Sires in clinic; CT chest showed persistent lung nodules and ground glass opacities in a different territory distribution. Steroids restarted at 20 mg daily; the patient was instructed to taper them to 10 mg.   04/30/12 The patient on 10 mg of prednisone daily;   05/06/12 Bronchoscopy with BAL, cell count and TBBx of the lingula  BAL cell count with elevated macrophages (possible COP, Hypersensitivity pneumonitis vs chronic microaspiration, possible Aluminum exposure)  RVP neg Viral culture neg  AFB smear neg, culture pending  Cytology/pathology benign with no evidence of aspiration or ILD    05/15/12  Remains on 10 mg steroids;  -methacholine challenge neg -barium swallow WNL  07/02/11 present visit -CT chest with resolution of GGO  - persistent nodules    Endorses similar symptoms revealed at previous visit with  me. Daily symptoms of shortness of breath and chest tightness, pleuritic with deep breathing and coughing, frequent nighttime awakenings for dyspnea. Overall feeling better.  He reports nocturia, trouble urinating and testicular pain; he has hot flashes and night sweats; no weight loss. Still having palpitations but not as frequent as during hospitalization. Was seen by urology yesterday; changes in medication improved his symptoms.    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 No other autoimmune manifestations and no changes in environment. No drug use.  Occupation: toxic exposure to aluminum, steel, plastic dust Travels:no travels abroad TB exposure:denis Pets:none   Past Medical History  Diagnosis Date  . Migraine headache     pt takes inderal for headaches  . Arthritis     s/p neck surgery  . Fibromyalgia   . Depression   . GERD (gastroesophageal reflux disease)   . Testosterone deficiency   . Anxiety   . Complication of anesthesia     " difficult to urinate after "  . Family history of anesthesia complication     mother gets nausea after  . Abnormal chest CT 03/2012    scattered patchy ground glass opacities and pulmonary nodules  . Chest pain     normal coronary arteries and LV fxn by cath 03/2012  . Tobacco abuse    Past Surgical History  Procedure Date  . Fracture surgery left ankle  . Posterior cervical fusion/foraminotomy 05/09/2011    Procedure: POSTERIOR CERVICAL FUSION/FORAMINOTOMY LEVEL 1;  Surgeon: Hewitt Shorts;  Location:  [facility];  Service: Neurosurgery;  Laterality: N/A;  C4/5 posterior arthrodesis with instrumentation   . Cystectomy   . Cardiac catheterization 03/25/2012  . Ankle fusion   . Video bronchoscopy 05/06/2012    Procedure: VIDEO BRONCHOSCOPY WITH FLUORO;  Surgeon: Roxine Caddy, MD;  Location: [facility];  Service: Cardiopulmonary;  Laterality: Bilateral;    History  Substance Use Topics  . Smoking status: Former Smoker -- 1.0 packs/day for 27 years    Types: Cigarettes    Quit date: 03/24/2012  . Smokeless tobacco: Former Neurosurgeon    Types: Chew  . Alcohol Use: No   Family History  Problem Relation Age of Onset  . Diabetes Mother   . Coronary artery disease Paternal Uncle   . Asthma Sister      Allergies  Allergen Reactions  . Midodrine Hcl Swelling    Tongue swelling  . Sulfonamide Derivatives Rash        Review of Systems  Constitutional: Negative for diarrhea. Negative for fever.  HENT: Negative for ear pain, congestion, sore throat, sneezing and trouble swallowing. Negative for dental problem.   Respiratory: Per HPI   Cardiovascular: Per HPI Gastrointestinal: Positive for abdominal pain.  Musculoskeletal: Negative for joint swelling.  Skin: Negative for rash.  Neurological: Positive for numbness.  Psychiatric/Behavioral: Positive for dysphoric mood. The patient is nervous/anxious.    Physical Exam BP 130/72  Pulse 94  Temp 97.1 F (36.2 C) (Oral)  Ht 5\' 8"  (1.727 m)  Wt 183 lb 6.4 oz (83.19 kg)  BMI 27.89 kg/m2  SpO2 99%  General: Comfortable HEENT ; pupils round and reactive to light; mild nasal mucosa edema and erythema; orophpharyngeal with no erythema, edema or exudate;  LAD: no cervical LAD  Cardiovascular: s1s2, no murmurs. Regular rate and rhythm.  Respiratory: Decreased breath sounds bilaterally with no wheezing No increased work of breathing.  Abdomen: soft NT ND BS+.   Extremities: no pedal edema. Homans negative  Central nervous system: Alert and oriented No focal neurological deficits.  PFTs 04/02/12         03/26/12 FVC 4.93 101%         5.1 105% (pre) 5.04 104% (post ) FEV1 4.03 102%       4.04 106% (pre) 4.26 111% (post) FEV1/FVC 82              DLCO                         90% TLC                           109% RV/TLC                     112%  MEP                              37% MIP                                 69%  Results for orders placed during the hospital encounter of 05/06/12  CULTURE, RESPIRATORY      Component Value Range   Specimen Description BRONCHIAL ALVEOLAR LAVAGE     Special Requests NONE     Gram Stain       Value: RARE  WBC PRESENT, PREDOMINANTLY MONONUCLEAR     NO SQUAMOUS EPITHELIAL CELLS SEEN     NO ORGANISMS SEEN   Culture Non-Pathogenic Oropharyngeal-type Flora Isolated.     Report Status 05/09/2012 FINAL    RESPIRATORY VIRUS PANEL      Component Value Range   Source - RVPAN BRONCHIAL ALVEOLAR LAVAGE     Respiratory Syncytial Virus A NOT DETECTED     Respiratory Syncytial Virus B NOT DETECTED     Influenza A NOT DETECTED     Influenza B NOT DETECTED     Parainfluenza 1 NOT DETECTED     Parainfluenza 2 NOT DETECTED     Parainfluenza 3 NOT DETECTED     Metapneumovirus NOT DETECTED     Rhinovirus NOT DETECTED     Adenovirus NOT DETECTED     Influenza A H1 NOT DETECTED     Influenza A H3 NOT DETECTED    PNEUMOCYSTIS JIROVECI SMEAR BY DFA      Component Value Range   Specimen Source-PJSRC BRONCHIAL ALVEOLAR LAVAGE     Pneumocystis jiroveci Ag NEGATIVE    AFB CULTURE WITH SMEAR      Component Value Range   Specimen Description BRONCHIAL ALVEOLAR LAVAGE     Special Requests NONE     ACID FAST SMEAR NO ACID FAST BACILLI SEEN     Culture NO ACID FAST BACILLI ISOLATED IN 6 WEEKS     Report Status 06/19/2012 FINAL    FUNGUS CULTURE W SMEAR      Component Value Range   Specimen Description BRONCHIAL ALVEOLAR LAVAGE     Special Requests NONE     Fungal Smear NO YEAST OR FUNGAL ELEMENTS SEEN     Culture CANDIDA GLABRATA     Report Status 06/01/2012 FINAL    BODY FLUID CELL COUNT WITH DIFFERENTIAL      Component Value Range   Fluid Type-FCT Body Fluid     Color, Fluid COLORLESS (*) YELLOW   Appearance, Fluid CLOUDY (*) CLEAR   WBC, Fluid 734  0 - 1000 cu mm   Neutrophil Count, Fluid 0  0 - 25 %   Lymphs, Fluid 5      Monocyte-Macrophage-Serous Fluid 95 (*) 50 - 90 %   Eos, Fluid 0     Other Cells, Fluid 0    VIRAL CULTURE VIRC      Component Value Range   Specimen Description BRONCHIAL ALVEOLAR LAVAGE     Special Requests NONE     Culture       Value: No Virus Isolated in Cell Culture                                                                A negative result does not exclude the possibility of virus infection;inappropriate specimen collection,storage and transport may lead to false negative      culture results.   Report Status 05/18/2012 FINAL       CTA 03/24/2012 No CT evidence for acute pulmonary embolus.  Scattered focal areas ground-glass attenuation bilaterally with an  upper lobe and slightly peripheral predominance. This may be  secondary to an infectious or inflammatory alveolitis. Cryptogenic  pneumonia would be a consideration. The density of the patchy  opacities is not as confluent is  typically seen for septic emboli.  Follow-up CT chest without contrast in 3 months is recommended to  determine whether these findings persist.  Bilateral pulmonary parenchymal nodules measuring up to 6.2 mm. If  the patient is at high risk for bronchogenic carcinoma, follow-up  chest CT at 6-12 months is recommended. If the patient is at low  risk for bronchogenic carcinoma, follow-up chest CT at 12 months is  recommended. This recommendation follows the consensus statement:  Guidelines for Management of Small Pulmonary Nodules Detected on CT  Scans: A Statement from the Fleischner Society as published in  Radiology 2005; 237:395-400.    SPEP note The possibility of a faint restricted band(s) cannot be completely excluded in the gamma region.  IFE normal   CT 06/22/2012  Findings: Stable scattered lower pulmonary nodules, unchanged,  including:  --3 mm right upper lobe nodule (series 3/image 21)  --3 mm left upper lobe nodule (series 3/image 38)  --4 mm subpleural right upper lobe nodule  (series 3/image 39)  --5 x 4 mm right middle lobe nodule (series 3/image 43)  Prior infectious/inflammatory opacities have resolved. No pleural  effusion or pneumothorax.  Visualized thyroid is unremarkable.  The heart is normal in size. No pericardial effusion.  No suspicious mediastinal or axillary lymphadenopathy.  Visualized upper abdomen is notable for a stable 1.5 cm probable  left renal cyst (series 2/image 72) and suspected layering  sludge/small gallstones in the gallbladder.  Prior anterior cervical fixation, incompletely visualized.  IMPRESSION:  Prior infectious/inflammatory opacities have resolved.  Stable scattered bilateral pulmonary nodules measuring up to 5 mm.  47-month stability has been demonstrated. Assuming the patient is  high risk for primary bronchogenic neoplasm, follow-up CT chest is  suggested in 3-9 months (6-12 months from initial CT).  This recommendation follows the consensus statement: Guidelines for  Management of Small Pulmonary Nodules Detected on CT Scans: A  Statement from the Fleischner Society as published in Radiology  2005; 237:395-400.   A/P GGO the patient highly worried that this might be malignancy; I suspect a steroid responding disease like organizing pneumonia.  Unfortunately TBBx did not reveal an etiology; Microbiology so far negative. CT 06/22/12 with resolution of GGO. I advised him to decrease the steroids to 5 mg daily. He will remain on 5 mg for 3 months; at the next visit we will repeat CT chest, ILD protocol.   Lung nodules 3-6.2 mm. For the lung nodules we will obtain a repeat CT chest in 3 months with next visit. He will need CT surveillance for 2 years to assess stability.  Etiology unclear. See the other problems. Autoimmune work up negative.   Chronic dyspnea and chest tightness; Of unclear etiology; methacholine challenge negative. If remains a persistent problem would consider a cardiopulmonary exercise test. I advised him  for now to stop Flovent.   Occupational toxic exposure.  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 Aluminum dust exposure could give nodular lung disease/chronic dyspnea.  Aluminum plasma and urine level measurement ordered but still pending.   IgG slightly low. Will repeat and also obtain IgG subclasses when off steroids.   Smoking cessation counseling provided in clinic. He quit 03/24/2012.   Abnormal SPEP and family history of amyloidosis. SPEP,  IFE normal.   Testosterone deficiency Was seen by urology; defer further work up to them.   GERD continue dexilant. Barium swallow WNL.  Consider in the  future a 24H pH probe with manometry. I am concerned of microaspiration contributing to his presentation.   Decreased MIP and MEP I am unclear how to interpret this result since the PFTs are impressively good. It is possible that he has a component of diaphragmatic weaknesses given h/o C4-C5 surgery. I am unclear if contributory to his dyspnea.    The patient is young with complex PMH.

## 2012-07-25 DIAGNOSIS — I214 Non-ST elevation (NSTEMI) myocardial infarction: Secondary | ICD-10-CM

## 2012-07-25 HISTORY — DX: Non-ST elevation (NSTEMI) myocardial infarction: I21.4

## 2012-08-10 ENCOUNTER — Emergency Department (HOSPITAL_COMMUNITY): Payer: BC Managed Care – PPO

## 2012-08-10 ENCOUNTER — Encounter (HOSPITAL_COMMUNITY): Payer: Self-pay | Admitting: *Deleted

## 2012-08-10 ENCOUNTER — Other Ambulatory Visit: Payer: Self-pay

## 2012-08-10 ENCOUNTER — Inpatient Hospital Stay (HOSPITAL_COMMUNITY)
Admission: EM | Admit: 2012-08-10 | Discharge: 2012-08-12 | DRG: 122 | Disposition: A | Discharge status: Home / Self Care | Payer: BC Managed Care – PPO | Attending: Internal Medicine | Admitting: Internal Medicine

## 2012-08-10 DIAGNOSIS — F411 Generalized anxiety disorder: Secondary | ICD-10-CM | POA: Diagnosis present

## 2012-08-10 DIAGNOSIS — R918 Other nonspecific abnormal finding of lung field: Secondary | ICD-10-CM | POA: Diagnosis present

## 2012-08-10 DIAGNOSIS — F329 Major depressive disorder, single episode, unspecified: Secondary | ICD-10-CM | POA: Diagnosis present

## 2012-08-10 DIAGNOSIS — R9389 Abnormal findings on diagnostic imaging of other specified body structures: Secondary | ICD-10-CM

## 2012-08-10 DIAGNOSIS — IMO0001 Reserved for inherently not codable concepts without codable children: Secondary | ICD-10-CM | POA: Diagnosis present

## 2012-08-10 DIAGNOSIS — R079 Chest pain, unspecified: Secondary | ICD-10-CM

## 2012-08-10 DIAGNOSIS — R7989 Other specified abnormal findings of blood chemistry: Secondary | ICD-10-CM

## 2012-08-10 DIAGNOSIS — K219 Gastro-esophageal reflux disease without esophagitis: Secondary | ICD-10-CM | POA: Diagnosis present

## 2012-08-10 DIAGNOSIS — G43909 Migraine, unspecified, not intractable, without status migrainosus: Secondary | ICD-10-CM | POA: Diagnosis present

## 2012-08-10 DIAGNOSIS — Z981 Arthrodesis status: Secondary | ICD-10-CM

## 2012-08-10 DIAGNOSIS — E291 Testicular hypofunction: Secondary | ICD-10-CM | POA: Diagnosis present

## 2012-08-10 DIAGNOSIS — Z79899 Other long term (current) drug therapy: Secondary | ICD-10-CM

## 2012-08-10 DIAGNOSIS — I214 Non-ST elevation (NSTEMI) myocardial infarction: Principal | ICD-10-CM | POA: Diagnosis present

## 2012-08-10 DIAGNOSIS — M129 Arthropathy, unspecified: Secondary | ICD-10-CM | POA: Diagnosis present

## 2012-08-10 DIAGNOSIS — Z882 Allergy status to sulfonamides status: Secondary | ICD-10-CM

## 2012-08-10 DIAGNOSIS — I201 Angina pectoris with documented spasm: Secondary | ICD-10-CM | POA: Diagnosis present

## 2012-08-10 DIAGNOSIS — Z87891 Personal history of nicotine dependence: Secondary | ICD-10-CM

## 2012-08-10 DIAGNOSIS — N400 Enlarged prostate without lower urinary tract symptoms: Secondary | ICD-10-CM | POA: Diagnosis present

## 2012-08-10 DIAGNOSIS — R911 Solitary pulmonary nodule: Secondary | ICD-10-CM | POA: Diagnosis present

## 2012-08-10 DIAGNOSIS — F3289 Other specified depressive episodes: Secondary | ICD-10-CM | POA: Diagnosis present

## 2012-08-10 DIAGNOSIS — Z888 Allergy status to other drugs, medicaments and biological substances status: Secondary | ICD-10-CM

## 2012-08-10 DIAGNOSIS — Z8249 Family history of ischemic heart disease and other diseases of the circulatory system: Secondary | ICD-10-CM

## 2012-08-10 DIAGNOSIS — Z7982 Long term (current) use of aspirin: Secondary | ICD-10-CM

## 2012-08-10 HISTORY — DX: Non-ST elevation (NSTEMI) myocardial infarction: I21.4

## 2012-08-10 LAB — RAPID URINE DRUG SCREEN, HOSP PERFORMED
Amphetamines: NOT DETECTED
Barbiturates: NOT DETECTED
Benzodiazepines: POSITIVE — AB
Cocaine: NOT DETECTED
Opiates: POSITIVE — AB
Tetrahydrocannabinol: NOT DETECTED

## 2012-08-10 LAB — CBC
HCT: 40.2 % (ref 39.0–52.0)
Hemoglobin: 13.7 g/dL (ref 13.0–17.0)
MCH: 30.8 pg (ref 26.0–34.0)
MCHC: 34.1 g/dL (ref 30.0–36.0)
MCV: 90.3 fL (ref 78.0–100.0)
Platelets: 184 10*3/uL (ref 150–400)
RBC: 4.45 MIL/uL (ref 4.22–5.81)
RDW: 12.7 % (ref 11.5–15.5)
WBC: 7.1 10*3/uL (ref 4.0–10.5)

## 2012-08-10 LAB — POCT I-STAT, CHEM 8
BUN: 10 mg/dL (ref 6–23)
Calcium, Ion: 1.26 mmol/L — ABNORMAL HIGH (ref 1.12–1.23)
Chloride: 103 mEq/L (ref 96–112)
Creatinine, Ser: 1.1 mg/dL (ref 0.50–1.35)
Glucose, Bld: 103 mg/dL — ABNORMAL HIGH (ref 70–99)
HCT: 41 % (ref 39.0–52.0)
Hemoglobin: 13.9 g/dL (ref 13.0–17.0)
Potassium: 4.6 mEq/L (ref 3.5–5.1)
Sodium: 142 mEq/L (ref 135–145)
TCO2: 31 mmol/L (ref 0–100)

## 2012-08-10 LAB — D-DIMER, QUANTITATIVE (NOT AT ARMC): D-Dimer, Quant: 2.99 ug/mL-FEU — ABNORMAL HIGH (ref 0.00–0.48)

## 2012-08-10 LAB — POCT I-STAT TROPONIN I: Troponin i, poc: 1.08 ng/mL (ref 0.00–0.08)

## 2012-08-10 LAB — URINALYSIS, ROUTINE W REFLEX MICROSCOPIC
Bilirubin Urine: NEGATIVE
Glucose, UA: NEGATIVE mg/dL
Hgb urine dipstick: NEGATIVE
Ketones, ur: NEGATIVE mg/dL
Leukocytes, UA: NEGATIVE
Nitrite: NEGATIVE
Protein, ur: NEGATIVE mg/dL
Specific Gravity, Urine: 1.025 (ref 1.005–1.030)
Urobilinogen, UA: 0.2 mg/dL (ref 0.0–1.0)
pH: 7 (ref 5.0–8.0)

## 2012-08-10 LAB — PROTIME-INR
INR: 0.99 (ref 0.00–1.49)
Prothrombin Time: 13 seconds (ref 11.6–15.2)

## 2012-08-10 LAB — TROPONIN I: Troponin I: 3.91 ng/mL (ref <0.30)

## 2012-08-10 LAB — MRSA PCR SCREENING: MRSA by PCR: NEGATIVE

## 2012-08-10 MED ORDER — AEROCHAMBER PLUS W/MASK MISC
1.0000 | Freq: Once | Status: DC
  Filled 2012-08-10: qty 1

## 2012-08-10 MED ORDER — HEPARIN BOLUS VIA INFUSION
4000.0000 [IU] | Freq: Once | INTRAVENOUS | Status: AC
  Administered 2012-08-10: 4000 [IU] via INTRAVENOUS

## 2012-08-10 MED ORDER — LAMOTRIGINE 100 MG PO TABS
100.0000 mg | ORAL_TABLET | Freq: Two times a day (BID) | ORAL | Status: DC
  Administered 2012-08-10 – 2012-08-12 (×4): 100 mg via ORAL
  Filled 2012-08-10 (×6): qty 1

## 2012-08-10 MED ORDER — MORPHINE SULFATE 4 MG/ML IJ SOLN
4.0000 mg | Freq: Once | INTRAMUSCULAR | Status: AC
  Administered 2012-08-10: 4 mg via INTRAVENOUS
  Filled 2012-08-10: qty 1

## 2012-08-10 MED ORDER — PREDNISONE 5 MG PO TABS
5.0000 mg | ORAL_TABLET | Freq: Every day | ORAL | Status: DC
  Administered 2012-08-11 – 2012-08-12 (×2): 5 mg via ORAL
  Filled 2012-08-10 (×3): qty 1

## 2012-08-10 MED ORDER — HEPARIN (PORCINE) IN NACL 100-0.45 UNIT/ML-% IJ SOLN
1400.0000 [IU]/h | INTRAMUSCULAR | Status: DC
  Administered 2012-08-10: 1300 [IU]/h via INTRAVENOUS
  Administered 2012-08-11: 1400 [IU]/h via INTRAVENOUS
  Filled 2012-08-10 (×2): qty 250

## 2012-08-10 MED ORDER — METOPROLOL TARTRATE 25 MG PO TABS
25.0000 mg | ORAL_TABLET | Freq: Two times a day (BID) | ORAL | Status: DC
  Administered 2012-08-10 – 2012-08-11 (×2): 25 mg via ORAL
  Filled 2012-08-10 (×3): qty 1

## 2012-08-10 MED ORDER — METOPROLOL TARTRATE 1 MG/ML IV SOLN
5.0000 mg | Freq: Once | INTRAVENOUS | Status: AC
  Administered 2012-08-10: 5 mg via INTRAVENOUS
  Filled 2012-08-10: qty 5

## 2012-08-10 MED ORDER — HYDROMORPHONE HCL PF 1 MG/ML IJ SOLN
1.0000 mg | Freq: Once | INTRAMUSCULAR | Status: AC
  Administered 2012-08-10: 1 mg via INTRAVENOUS
  Filled 2012-08-10: qty 1

## 2012-08-10 MED ORDER — ATORVASTATIN CALCIUM 80 MG PO TABS
80.0000 mg | ORAL_TABLET | Freq: Every day | ORAL | Status: DC
  Filled 2012-08-10 (×3): qty 1

## 2012-08-10 MED ORDER — ACETAMINOPHEN 325 MG PO TABS: 650.0000 mg | ORAL_TABLET | ORAL | Status: DC | PRN

## 2012-08-10 MED ORDER — BUSPIRONE HCL 10 MG PO TABS
10.0000 mg | ORAL_TABLET | Freq: Two times a day (BID) | ORAL | Status: DC
  Administered 2012-08-10 – 2012-08-12 (×4): 10 mg via ORAL
  Filled 2012-08-10 (×6): qty 1

## 2012-08-10 MED ORDER — FLUOXETINE HCL 20 MG PO CAPS
60.0000 mg | ORAL_CAPSULE | Freq: Every day | ORAL | Status: DC
Start: 2012-08-11 — End: 2012-08-12
  Administered 2012-08-11 – 2012-08-12 (×2): 60 mg via ORAL
  Filled 2012-08-10 (×2): qty 3

## 2012-08-10 MED ORDER — ASPIRIN 81 MG PO CHEW: 324.0000 mg | CHEWABLE_TABLET | ORAL | Status: DC

## 2012-08-10 MED ORDER — IOHEXOL 350 MG/ML SOLN
100.0000 mL | Freq: Once | INTRAVENOUS | Status: AC | PRN
  Administered 2012-08-10: 100 mL via INTRAVENOUS

## 2012-08-10 MED ORDER — OXYCODONE-ACETAMINOPHEN 5-325 MG PO TABS
1.0000 | ORAL_TABLET | ORAL | Status: DC | PRN
  Administered 2012-08-10 – 2012-08-11 (×4): 2 via ORAL
  Administered 2012-08-11: 1 via ORAL
  Administered 2012-08-12 (×2): 2 via ORAL
  Filled 2012-08-10 (×2): qty 1
  Filled 2012-08-10 (×4): qty 2
  Filled 2012-08-10: qty 1
  Filled 2012-08-10: qty 2

## 2012-08-10 MED ORDER — SODIUM CHLORIDE 0.9 % IJ SOLN: 3.0000 mL | INTRAMUSCULAR | Status: DC | PRN

## 2012-08-10 MED ORDER — ONDANSETRON HCL 4 MG/2ML IJ SOLN
4.0000 mg | Freq: Four times a day (QID) | INTRAMUSCULAR | Status: DC | PRN
  Administered 2012-08-12: 4 mg via INTRAVENOUS
  Filled 2012-08-10: qty 2

## 2012-08-10 MED ORDER — PANTOPRAZOLE SODIUM 40 MG PO TBEC
40.0000 mg | DELAYED_RELEASE_TABLET | Freq: Every day | ORAL | Status: DC
  Administered 2012-08-11 – 2012-08-12 (×2): 40 mg via ORAL
  Filled 2012-08-10 (×2): qty 1

## 2012-08-10 MED ORDER — CHLORPROMAZINE HCL 25 MG PO TABS
25.0000 mg | ORAL_TABLET | ORAL | Status: DC | PRN
  Filled 2012-08-10: qty 1

## 2012-08-10 MED ORDER — NITROGLYCERIN 0.4 MG SL SUBL: 0.4000 mg | SUBLINGUAL_TABLET | SUBLINGUAL | Status: DC | PRN

## 2012-08-10 MED ORDER — HEPARIN SODIUM (PORCINE) 5000 UNIT/ML IJ SOLN: 60.0000 [IU]/kg | Freq: Once | INTRAMUSCULAR | Status: DC

## 2012-08-10 MED ORDER — ASPIRIN 81 MG PO CHEW
324.0000 mg | CHEWABLE_TABLET | ORAL | Status: AC
  Administered 2012-08-11: 324 mg via ORAL
  Filled 2012-08-10: qty 4

## 2012-08-10 MED ORDER — ALBUTEROL SULFATE HFA 108 (90 BASE) MCG/ACT IN AERS
2.0000 | INHALATION_SPRAY | Freq: Four times a day (QID) | RESPIRATORY_TRACT | Status: DC | PRN
  Filled 2012-08-10: qty 6.7

## 2012-08-10 MED ORDER — ASPIRIN EC 81 MG PO TBEC
81.0000 mg | DELAYED_RELEASE_TABLET | Freq: Every day | ORAL | Status: DC
  Administered 2012-08-12: 81 mg via ORAL
  Filled 2012-08-10 (×2): qty 1

## 2012-08-10 MED ORDER — NITROGLYCERIN IN D5W 200-5 MCG/ML-% IV SOLN
2.0000 ug/min | INTRAVENOUS | Status: DC
  Administered 2012-08-10: 5 ug/min via INTRAVENOUS
  Filled 2012-08-10: qty 250

## 2012-08-10 MED ORDER — SODIUM CHLORIDE 0.9 % IV SOLN
1.0000 mL/kg/h | INTRAVENOUS | Status: DC
  Administered 2012-08-11 (×2): 1 mL/kg/h via INTRAVENOUS

## 2012-08-10 MED ORDER — SODIUM CHLORIDE 0.9 % IJ SOLN
3.0000 mL | Freq: Two times a day (BID) | INTRAMUSCULAR | Status: DC
  Administered 2012-08-11: 3 mL via INTRAVENOUS

## 2012-08-10 MED ORDER — SPACER/AERO CHAMBER MOUTHPIECE MISC: 1.0000 | Status: DC

## 2012-08-10 MED ORDER — SODIUM CHLORIDE 0.9 % IV SOLN: 250.0000 mL | INTRAVENOUS | Status: DC | PRN

## 2012-08-10 MED ORDER — AZATHIOPRINE 50 MG PO TABS
50.0000 mg | ORAL_TABLET | Freq: Every day | ORAL | Status: DC
Start: 2012-08-11 — End: 2012-08-11
  Filled 2012-08-10: qty 1

## 2012-08-10 MED ORDER — ACETAMINOPHEN 325 MG PO TABS
650.0000 mg | ORAL_TABLET | Freq: Once | ORAL | Status: AC
  Administered 2012-08-10: 650 mg via ORAL
  Filled 2012-08-10: qty 2

## 2012-08-10 MED ORDER — DEXTROSE 5 % IV SOLN: 1.0000 g | Freq: Once | INTRAVENOUS | Status: DC

## 2012-08-10 MED ORDER — ASPIRIN 325 MG PO TABS
325.0000 mg | ORAL_TABLET | Freq: Once | ORAL | Status: AC
  Administered 2012-08-10: 325 mg via ORAL
  Filled 2012-08-10: qty 1

## 2012-08-10 MED ORDER — ASPIRIN 300 MG RE SUPP: 300.0000 mg | RECTAL | Status: DC

## 2012-08-10 MED ORDER — ALFUZOSIN HCL ER 10 MG PO TB24
10.0000 mg | ORAL_TABLET | Freq: Every day | ORAL | Status: DC
  Administered 2012-08-11 – 2012-08-12 (×3): 10 mg via ORAL
  Filled 2012-08-10 (×4): qty 1

## 2012-08-10 NOTE — ED Notes (Signed)
Dr. Ross at [facility] ?

## 2012-08-10 NOTE — ED Provider Notes (Signed)
Medical screening examination/treatment/procedure(s) were performed by non-physician practitioner and as supervising physician I was immediately available for consultation/collaboration.  Tobin Chad, MD 08/10/12 1650

## 2012-08-10 NOTE — ED Provider Notes (Signed)
Results for orders placed during the hospital encounter of 08/10/12  CBC      Result Value Range   WBC 7.1  4.0 - 10.5 K/uL   RBC 4.45  4.22 - 5.81 MIL/uL   Hemoglobin 13.7  13.0 - 17.0 g/dL   HCT 09.8  11.9 - 14.7 %   MCV 90.3  78.0 - 100.0 fL   MCH 30.8  26.0 - 34.0 pg   MCHC 34.1  30.0 - 36.0 g/dL   RDW 82.9  56.2 - 13.0 %   Platelets 184  150 - 400 K/uL  URINALYSIS, ROUTINE W REFLEX MICROSCOPIC      Result Value Range   Color, Urine YELLOW  YELLOW   APPearance CLEAR  CLEAR   Specific Gravity, Urine 1.025  1.005 - 1.030   pH 7.0  5.0 - 8.0   Glucose, UA NEGATIVE  NEGATIVE mg/dL   Hgb urine dipstick NEGATIVE  NEGATIVE   Bilirubin Urine NEGATIVE  NEGATIVE   Ketones, ur NEGATIVE  NEGATIVE mg/dL   Protein, ur NEGATIVE  NEGATIVE mg/dL   Urobilinogen, UA 0.2  0.0 - 1.0 mg/dL   Nitrite NEGATIVE  NEGATIVE   Leukocytes, UA NEGATIVE  NEGATIVE  D-DIMER, QUANTITATIVE      Result Value Range   D-Dimer, Quant 2.99 (*) 0.00 - 0.48 ug/mL-FEU  TROPONIN I      Result Value Range   Troponin I 3.91 (*) <0.30 ng/mL  PROTIME-INR      Result Value Range   Prothrombin Time 13.0  11.6 - 15.2 seconds   INR 0.99  0.00 - 1.49  URINE RAPID DRUG SCREEN (HOSP PERFORMED)      Result Value Range   Opiates POSITIVE (*) NONE DETECTED   Cocaine NONE DETECTED  NONE DETECTED   Benzodiazepines POSITIVE (*) NONE DETECTED   Amphetamines NONE DETECTED  NONE DETECTED   Tetrahydrocannabinol NONE DETECTED  NONE DETECTED   Barbiturates NONE DETECTED  NONE DETECTED  POCT I-STAT, CHEM 8      Result Value Range   Sodium 142  135 - 145 mEq/L   Potassium 4.6  3.5 - 5.1 mEq/L   Chloride 103  96 - 112 mEq/L   BUN 10  6 - 23 mg/dL   Creatinine, Ser 8.65  0.50 - 1.35 mg/dL   Glucose, Bld 784 (*) 70 - 99 mg/dL   Calcium, Ion 6.96 (*) 1.12 - 1.23 mmol/L   TCO2 31  0 - 100 mmol/L   Hemoglobin 13.9  13.0 - 17.0 g/dL   HCT 29.5  28.4 - 13.2 %  POCT I-STAT TROPONIN I      Result Value Range   Troponin i, poc 1.08  (*) 0.00 - 0.08 ng/mL   Comment NOTIFIED PHYSICIAN     Comment 3            DG CHEST 2 VIEW   Final Result:      CT ANGIO CHEST PE W/CM &/OR WO CM   Final Result:         Care transferred from [facility], on 46yo w/ CP, negative cath, nml EKG, but elevated iStat trop.  D-dimer also positive.  CT PE study ordered. Heparin and NTG gtt started.   7:42 Pain improved to 4/10 after nitro gtt.  CTA w/o PE.  Have consulted [facility] for admission for possible NSTEMI.  Will try additional morphine and titrate up on NTG gtt.   1. NSTEMI (non-ST elevated myocardial infarction)  2. Chest pain      Toy Cookey, MD 08/10/12 [phone]

## 2012-08-10 NOTE — ED Provider Notes (Signed)
I saw and evaluated the patient, reviewed the resident's note and I agree with the findings and plan.   Richard Sprout, MD 08/10/12 2255

## 2012-08-10 NOTE — ED Notes (Signed)
Critical value Troponin Reported to EDP

## 2012-08-10 NOTE — ED Provider Notes (Addendum)
History     CSN: [id number]  Arrival date & time 08/10/12  1331   First MD Initiated Contact with Patient 08/10/12 1359      Chief Complaint  Patient presents with  . Chest Pain    (Consider location/radiation/quality/duration/timing/severity/associated sxs/prior treatment) HPI Richard Franco is a 47 year old male with a past medical history significant for chest pain chest pain and pulmonary disease.  The patient has had previous cardiac workup and cardiac catheterization that showed no atherosclerotic disease and left ventricular ejection fraction of 60% in October of 2013. (see discharge note 03/25/2012). Patient was also found to have ground glass opacities and pulmonary nodules.  He has been followed by pulmonology with extensive workup.  He has been on taking prednisone daily since January.  He is currently taking 5 mg a day.  His previous CT scan was negative for opacities.  patient states that he's been doing well until this morning when he awoke with severe substernal chest pain.  He states that his pain is 10 out of 10 and worse with movement he had associated bilateral jaw tightness and pain and aching pain in his bilateral arms with associated heaviness.  Patient describes his chest pain is sharp and stabbing.  Denies any associated nausea vomiting or diaphoresis. He has no previous history of pulmonary embolism or DVT.  He is a nonsmoker.  Patient complains that he still has substernal chest pain states that the stabbing pain is constant and is currently rated at a 6/10.  Patient was given nitroglycerin at his primary care physician's office which did relieve his symptoms somewhat. Denies any fevers, chills, nausea, myalgias or arthralgias.  He has had no cough. Past Medical History  Diagnosis Date  . Migraine headache     pt takes inderal for headaches  . Arthritis     s/p neck surgery  . Fibromyalgia   . Depression   . GERD (gastroesophageal reflux disease)   . Testosterone  deficiency   . Anxiety   . Complication of anesthesia     " difficult to urinate after "  . Family history of anesthesia complication     mother gets nausea after  . Abnormal chest CT 03/2012    scattered patchy ground glass opacities and pulmonary nodules  . Chest pain     normal coronary arteries and LV fxn by cath 03/2012  . Tobacco abuse     Past Surgical History  Procedure Laterality Date  . Fracture surgery  left ankle  . Posterior cervical fusion/foraminotomy  05/09/2011    Procedure: POSTERIOR CERVICAL FUSION/FORAMINOTOMY LEVEL 1;  Surgeon: Hewitt Shorts;  Location: [facility];  Service: Neurosurgery;  Laterality: N/A;  C4/5 posterior arthrodesis with instrumentation   . Cystectomy    . Cardiac catheterization  03/25/2012  . Ankle fusion    . Video bronchoscopy  05/06/2012    Procedure: VIDEO BRONCHOSCOPY WITH FLUORO;  Surgeon: Roxine Caddy, MD;  Location: [facility];  Service: Cardiopulmonary;  Laterality: Bilateral;    Family History  Problem Relation Age of Onset  . Diabetes Mother   . Coronary artery disease Paternal Uncle   . Asthma Sister     History  Substance Use Topics  . Smoking status: Former Smoker -- 1.00 packs/day for 27 years    Types: Cigarettes    Quit date: 03/24/2012  . Smokeless tobacco: Former Neurosurgeon    Types: Chew  . Alcohol Use: No      Review of Systems Ten  systems reviewed and are negative for acute change, except as noted in the HPI.   Allergies  Midodrine hcl and Sulfonamide derivatives  Home Medications   Current Outpatient Rx  Name  Route  Sig  Dispense  Refill  . albuterol (PROVENTIL HFA;VENTOLIN HFA) 108 (90 BASE) MCG/ACT inhaler   Inhalation   Inhale 2 puffs into the lungs every 6 (six) hours as needed for wheezing.   1 Inhaler   6   . alfuzosin (UROXATRAL) 10 MG 24 hr tablet   Oral   Take 10 mg by mouth daily.         Marland Kitchen azaTHIOprine (IMURAN) 50 MG tablet   Oral   Take 50 mg by mouth daily.          . busPIRone (BUSPAR) 10 MG tablet   Oral   Take 10 mg by mouth 2 (two) times daily.         . chlorproMAZINE (THORAZINE) 25 MG tablet   Oral   Take 1 tablet by mouth as needed. For migraines         . DEXILANT 60 MG capsule      TAKE 1 CAPSULE (60 MG TOTAL) BY MOUTH 2 (TWO) TIMES DAILY BEFORE A MEAL.   60 capsule   2   . FLUoxetine (PROZAC) 20 MG capsule   Oral   Take 60 mg by mouth daily.         Marland Kitchen lamoTRIgine (LAMICTAL) 100 MG tablet   Oral   Take 100 mg by mouth 2 (two) times daily.         Marland Kitchen oxyCODONE-acetaminophen (PERCOCET) 5-325 MG per tablet   Oral   Take 1-2 tablets by mouth every 4 (four) hours as needed. As needed for pain.         . predniSONE (DELTASONE) 5 MG tablet   Oral   Take 5 mg by mouth daily.         Marland Kitchen Spacer/Aero Chamber Mouthpiece MISC   Does not apply   1 applicator by Does not apply route 1 day or 1 dose.   1 each   0     BP 121/72  Pulse 83  Temp(Src) 98.1 F (36.7 C) (Oral)  Resp 18  SpO2 98%  Physical Exam  Physical Exam  Nursing note and vitals reviewed. Constitutional: He appears well-developed and well-nourished. No distress.  appears uncomfortable HENT:  Head: Normocephalic and atraumatic.  Eyes: Conjunctivae normal are normal. No scleral icterus.  Neck: Normal range of motion. Neck supple.  Cardiovascular: Normal rate, regular rhythm and normal heart sounds.   Pulmonary/Chest: Effort normal and breath sounds normal. No respiratory distress.  pain is not reproducible with palpation Abdominal: Soft. There is no tenderness.  Musculoskeletal: He exhibits no edema.  Neurological: He is alert.  Skin: Skin is warm and dry. He is not diaphoretic.  Psychiatric: His behavior is normal.    ED Course  Procedures (including critical care time)  Labs Reviewed  CBC  URINALYSIS, ROUTINE W REFLEX MICROSCOPIC   Dg Chest 2 View  08/10/2012  *RADIOLOGY REPORT*  Clinical Data: Chest pain  CHEST - 2 VIEW  Comparison:   May 06, 2012  Findings:  There is an apparent nipple shadow on the left. Elsewhere lungs clear.  Heart size and pulmonary vascularity are normal.  No adenopathy.  There is postoperative change in the lower cervical spine.  There is mid thoracic levoscoliosis. No pneumothorax.  IMPRESSION: No edema or consolidation.  Original Report Authenticated By: Bretta Bang, M.D.      Date: 08/10/2012  Rate: 79  Rhythm: normal sinus rhythm  QRS Axis: normal  Intervals: normal  ST/T Wave abnormalities: normal  Conduction Disutrbances: none  Narrative Interpretation:   Old EKG Reviewed:  No significant changes noted     No diagnosis found.    MDM  3:03 PM BP 122/71  Pulse 78  Temp(Src) 98.1 F (36.7 C) (Oral)  Resp 14  SpO2 97% Patient with atypical chest pain presentation.  His chest x-ray today reveals no acute abnormalities.  There is no comment on ground glass opacities or nodules. Patient's previous heart catheterization showed no evidence of atherosclerotic disease.  And his chest pain presentation would be.  Atypical.  I suspect pulmonary embolus as his O2 sats have been normal no tachycardia or hypotension.  No hemoptysis. Wells and PERC negative      3:32 PM Patient states that he is still having CP. Only mild relief with morphine.  Will redose with dilaudid   4:17 PM patient with critical lab POCT istat troponin.Will dose with ASA and verify with troponin I. I have given report to Dr. Micheline Maze who will assume care of the patient.  Arthor Captain, PA-C 08/10/12 1621  Arthor Captain, PA-C 08/11/12 [phone]

## 2012-08-10 NOTE — ED Notes (Addendum)
Patient states at approx 1100 he started having chest pain in central chest with both arms feeling heavy and weak, patient received nitro prior to leaving pcp office with stated relief,

## 2012-08-10 NOTE — Progress Notes (Signed)
ANTICOAGULATION CONSULT NOTE - Initial Consult  Pharmacy Consult for UFH Indication: NSTEMI  Allergies  Allergen Reactions  . Midodrine Hcl Swelling    Tongue swelling  . Sulfonamide Derivatives Rash         Patient Measurements: Height: 5' 8.11" (173 cm) Weight: 183 lb 6.8 oz (83.2 kg) IBW/kg (Calculated) : 68.65 Heparin Dosing Weight: 83kg  Vital Signs: Temp: 98.1 F (36.7 C) (02/17 1333) Temp src: Oral (02/17 1333) BP: 130/79 mmHg (02/17 1730) Pulse Rate: 76 (02/17 1730)  Labs:  Recent Labs  08/10/12 1452 08/10/12 1524 08/10/12 1605  HGB 13.7 13.9  --   HCT 40.2 41.0  --   PLT 184  --   --   CREATININE  --  1.10  --   TROPONINI  --   --  3.91*    Estimated Creatinine Clearance: 89.4 ml/min (by C-G formula based on Cr of 1.1).   Medical History: Past Medical History  Diagnosis Date  . Migraine headache     pt takes inderal for headaches  . Arthritis     s/p neck surgery  . Fibromyalgia   . Depression   . GERD (gastroesophageal reflux disease)   . Testosterone deficiency   . Anxiety   . Complication of anesthesia     " difficult to urinate after "  . Family history of anesthesia complication     mother gets nausea after  . Abnormal chest CT 03/2012    scattered patchy ground glass opacities and pulmonary nodules  . Chest pain     normal coronary arteries and LV fxn by cath 03/2012  . Tobacco abuse     Medications:   (Not in a hospital admission)  Assessment: 47 y/o male patient admitted with chest pain requiring anticoagulation for NSTEMI. Troponin positive, EKG wnl.  Goal of Therapy:  Heparin level 0.3-0.7 units/ml Monitor platelets by anticoagulation protocol: Yes   Plan:  Heparin 4000 unit IV bolus followed by infusion at 1300 units/hr. Check 6 hour heparin level followed by daily cbc and heparin level.  Verlene Mayer, PharmD, BCPS Pager [phone] 08/10/2012,6:00 PM

## 2012-08-10 NOTE — ED Notes (Signed)
No changes. Pt up to 2900 on monitor heparin, ntg, O2 with RN. VSS. CP 1/10.

## 2012-08-10 NOTE — H&P (Signed)
Patient ID: Richard Franco MRN: [id number], DOB/AGE: 46/18/1967   Admit date: 08/10/2012 Date of Consult: @TODAY @  Primary Physician: Rudi Heap, MD Primary Cardiologist:  Golden Circle, MD Pulmonologist:  Vanetta Mulders, MD    Problem List: Past Medical History  Diagnosis Date  . Migraine headache     pt takes inderal for headaches  . Arthritis     s/p neck surgery  . Fibromyalgia   . Depression   . GERD (gastroesophageal reflux disease)   . Testosterone deficiency   . Anxiety   . Complication of anesthesia     " difficult to urinate after "  . Family history of anesthesia complication     mother gets nausea after  . Abnormal chest CT 03/2012    scattered patchy ground glass opacities and pulmonary nodules  . Chest pain     normal coronary arteries and LV fxn by cath 03/2012  . Tobacco abuse     Past Surgical History  Procedure Laterality Date  . Fracture surgery  left ankle  . Posterior cervical fusion/foraminotomy  05/09/2011    Procedure: POSTERIOR CERVICAL FUSION/FORAMINOTOMY LEVEL 1;  Surgeon: Hewitt Shorts;  Location: [facility];  Service: Neurosurgery;  Laterality: N/A;  C4/5 posterior arthrodesis with instrumentation   . Cystectomy    . Cardiac catheterization  03/25/2012  . Ankle fusion    . Video bronchoscopy  05/06/2012    Procedure: VIDEO BRONCHOSCOPY WITH FLUORO;  Surgeon: Roxine Caddy, MD;  Location: [facility];  Service: Cardiopulmonary;  Laterality: Bilateral;     Allergies:  Allergies  Allergen Reactions  . Midodrine Hcl Swelling    Tongue swelling  . Sulfonamide Derivatives Rash         HPI: Patient is a 47 yo with history of CP, GERD, abnormal chest CT who presents for evaluation of CP The patient woke up with CP at about 11 AM   Was 10/10 in intensity.  Not worse with inspiration or position. Went to primary MD Gari Crown)  Sent to [facility] ER at about 1 PM Initial POC Trop was 1.  Patient treated with NTG, heparin, MSO4.  Pain now  at 3/10. EKG x2 without ST changes.  D Dimer positive but CT showed no PE  Coronary did not show atherosclerosis. Repeat troponin was 3.91.  Patient says over the past week he has been more fatigued.  Sleeping more. No f/c  No Cough.   Patient admitted in 03/2012  CP  He ruled out for MI at time  Had cath done  No CAD CT chest showed ground glass appearance.  Treated empirically with steroids.  Followed by D Albon in clinic.  NOte echo in 3/11 LVEF was 50 to 55%  ESR was 11, BNP 14.     Inpatient Medications:  .  morphine injection  4 mg Intravenous Once    Family History  Problem Relation Age of Onset  . Diabetes Mother   . Coronary artery disease Paternal Uncle   . Asthma Sister      History   Social History  . Marital Status: Married    Spouse Name: N/A    Number of Children: N/A  . Years of Education: N/A   Occupational History  . Not on file.   Social History Main Topics  . Smoking status: Former Smoker -- 1.00 packs/day for 27 years    Types: Cigarettes    Quit date: 03/24/2012  . Smokeless tobacco: Former Neurosurgeon  Types: Chew  . Alcohol Use: No  . Drug Use: No  . Sexually Active: Not on file   Other Topics Concern  . Not on file   Social History Narrative  . No narrative on file     Review of Systems: General: negative for chills, fever, night sweats or weight changes.  All other systems reviewed and are otherwise negative except as noted above.  Physical Exam: Filed Vitals:   08/10/12 1930  BP: 127/69  Pulse: 75  Temp:   Resp: 26   No intake or output data in the 24 hours ending 08/10/12 2009  General: Well developed, well nourished, in no acute distress.  Reports 3/10 CP Head: Normocephalic, atraumatic, sclera non-icteric Neck: Negative for carotid bruits. JVP not elevated. Lungs: Clear bilaterally to auscultation without wheezes, rales, or rhonchi. Breathing is unlabored.  No rales  Heart: RRR with S1 S2. No murmurs, rubs, or gallops  appreciated. Abdomen: Soft, non-tender, non-distended with normoactive bowel sounds. No hepatomegaly. No rebound/guarding. No obvious abdominal masses. Msk:  Strength and tone appears normal for age. Extremities: No clubbing, cyanosis or edema.  Distal pedal pulses are 2+ and equal bilaterally. Neuro: Alert and oriented X 3. Moves all extremities spontaneously. Psych:  Responds to questions appropriately with a normal affect.  EKG:  NSR.  79 bpm.  Labs: Results for orders placed during the hospital encounter of 08/10/12 (from the past 24 hour(s))  CBC     Status: None   Collection Time    08/10/12  2:52 PM      Result Value Range   WBC 7.1  4.0 - 10.5 K/uL   RBC 4.45  4.22 - 5.81 MIL/uL   Hemoglobin 13.7  13.0 - 17.0 g/dL   HCT 16.1  09.6 - 04.5 %   MCV 90.3  78.0 - 100.0 fL   MCH 30.8  26.0 - 34.0 pg   MCHC 34.1  30.0 - 36.0 g/dL   RDW 40.9  81.1 - 91.4 %   Platelets 184  150 - 400 K/uL  POCT I-STAT TROPONIN I     Status: Abnormal   Collection Time    08/10/12  3:22 PM      Result Value Range   Troponin i, poc 1.08 (*) 0.00 - 0.08 ng/mL   Comment NOTIFIED PHYSICIAN     Comment 3           POCT I-STAT, CHEM 8     Status: Abnormal   Collection Time    08/10/12  3:24 PM      Result Value Range   Sodium 142  135 - 145 mEq/L   Potassium 4.6  3.5 - 5.1 mEq/L   Chloride 103  96 - 112 mEq/L   BUN 10  6 - 23 mg/dL   Creatinine, Ser 7.82  0.50 - 1.35 mg/dL   Glucose, Bld 956 (*) 70 - 99 mg/dL   Calcium, Ion 2.13 (*) 1.12 - 1.23 mmol/L   TCO2 31  0 - 100 mmol/L   Hemoglobin 13.9  13.0 - 17.0 g/dL   HCT 08.6  57.8 - 46.9 %  URINALYSIS, ROUTINE W REFLEX MICROSCOPIC     Status: None   Collection Time    08/10/12  3:37 PM      Result Value Range   Color, Urine YELLOW  YELLOW   APPearance CLEAR  CLEAR   Specific Gravity, Urine 1.025  1.005 - 1.030   pH 7.0  5.0 - 8.0  Glucose, UA NEGATIVE  NEGATIVE mg/dL   Hgb urine dipstick NEGATIVE  NEGATIVE   Bilirubin Urine NEGATIVE   NEGATIVE   Ketones, ur NEGATIVE  NEGATIVE mg/dL   Protein, ur NEGATIVE  NEGATIVE mg/dL   Urobilinogen, UA 0.2  0.0 - 1.0 mg/dL   Nitrite NEGATIVE  NEGATIVE   Leukocytes, UA NEGATIVE  NEGATIVE  D-DIMER, QUANTITATIVE     Status: Abnormal   Collection Time    08/10/12  3:43 PM      Result Value Range   D-Dimer, Quant 2.99 (*) 0.00 - 0.48 ug/mL-FEU  TROPONIN I     Status: Abnormal   Collection Time    08/10/12  4:05 PM      Result Value Range   Troponin I 3.91 (*) <0.30 ng/mL  URINE RAPID DRUG SCREEN (HOSP PERFORMED)     Status: Abnormal   Collection Time    08/10/12  5:31 PM      Result Value Range   Opiates POSITIVE (*) NONE DETECTED   Cocaine NONE DETECTED  NONE DETECTED   Benzodiazepines POSITIVE (*) NONE DETECTED   Amphetamines NONE DETECTED  NONE DETECTED   Tetrahydrocannabinol NONE DETECTED  NONE DETECTED   Barbiturates NONE DETECTED  NONE DETECTED  PROTIME-INR     Status: None   Collection Time    08/10/12  5:32 PM      Result Value Range   Prothrombin Time 13.0  11.6 - 15.2 seconds   INR 0.99  0.00 - 1.49    Radiology/Studies: Dg Chest 2 View  08/10/2012  *RADIOLOGY REPORT*  Clinical Data: Chest pain  CHEST - 2 VIEW  Comparison:  May 06, 2012  Findings:  There is an apparent nipple shadow on the left. Elsewhere lungs clear.  Heart size and pulmonary vascularity are normal.  No adenopathy.  There is postoperative change in the lower cervical spine.  There is mid thoracic levoscoliosis. No pneumothorax.  IMPRESSION: No edema or consolidation.   Original Report Authenticated By: Bretta Bang, M.D.    Ct Angio Chest Pe W/cm &/or Wo Cm  08/10/2012  *RADIOLOGY REPORT*  Clinical Data: Pain, elevated D-dimer evaluate for pulmonary embolus  CT ANGIOGRAPHY CHEST  Technique:  Multidetector CT imaging of the chest using the standard protocol during bolus administration of intravenous contrast. Multiplanar reconstructed images including MIPs were obtained and reviewed to  evaluate the vascular anatomy.  Contrast: OMNIPAQUE IOHEXOL 350 MG/ML SOLN  Comparison: Chest x-ray earlier today 08/10/2012 at 08/07/2018 07:00 p.m.; prior chest CT 06/22/2012  Findings:  Mediastinum: Unremarkable CT appearance of the thyroid gland.  No suspicious mediastinal or hilar adenopathy.  No soft tissue mass. The thoracic esophagus is within normal limits.  Heart/Vascular: Excellent opacification of the pulmonary arterial tree to the subsegmental level.  No central filling defect to suggest acute pulmonary embolus.  The main central pulmonary arteries within normal limits for size.  The heart is within normal limits for size.  No pericardial effusion.  Conventional three- vessel arch anatomy.  No aneurysmal dilatation or significant atherosclerotic vascular disease.  Lungs/Pleura: Early paraseptal and centrilobular emphysema.  Mild dependent atelectasis in the bilateral lower lobes.  No focal consolidation, pneumothorax or pleural effusion. Stable scattered bilateral pulmonary nodules the largest of which measures 6 mm in the right middle lobe.  The size, number and distribution have not significantly changed compared to 06/22/2012 remain consistent with an underlying infectious, inflammatory or granulomatous process.  Upper Abdomen: Unremarkable visualized upper abdomen.  Bones: No acute  fracture or aggressive appearing lytic or blastic osseous lesion.  IMPRESSION:  1.  Negative for pulmonary embolus or other acute cardiopulmonary process. 2.  Stable scattered bilateral pulmonary nodules most consistent with an underlying infectious/inflammatory or granulomatous process.  3.  Early paraseptal and centrilobular emphysema   Original Report Authenticated By: Malachy Moan, M.D.     EKG  As noted above.  ASSESSMENT AND PLAN:  Patient is 47 yo who has a history of CP and abnormal chest CT Presented to ER from [facility] office after being woken up with CP EKGs without acute changes.  Trop though  is positive (has not been in past) On exam, no evid of CHF.  No rub.   I have reviewed CT with radiology.  NO PE  Coronary arteries are without calcification  Patient currently without minimal discomfort (0-1/10) on heparin, NTG.  Has received MSO4, and lopressor QUick look echo shows no pericardial effusion.  Normal LV and RV size and function.  Impression:  Elevated troponin Will cycle cardiac enzymes.  Check ESR and CRP.   ? A connection with lung problems (myocarditis)  Probable  cath in AM.  Discussed with patient.  He understands. Continue heparin, lopressor, NTG  2.  Pulmonary.  Will contact D Albon in AM   Signed, Dietrich Pates 08/10/2012, 8:09 PM

## 2012-08-10 NOTE — ED Notes (Signed)
Urinal has been provided for the patient. The patient states that he is unable to void at this time.  Patient provided ice ships.  Sarah-rn approved.

## 2012-08-10 NOTE — ED Notes (Signed)
Pt alert, NAD, calm, interactive, resps e/u, speaking in clear complete sentences, c/o HA (d/t ntg) and CP, CP rated 4/10, ntg increased per Dr. Micheline Maze. Family at [facility] x3.

## 2012-08-11 ENCOUNTER — Encounter (HOSPITAL_COMMUNITY)
Admission: EM | Disposition: A | Discharge status: Home / Self Care | Payer: Self-pay | Source: Home / Self Care | Attending: Internal Medicine

## 2012-08-11 DIAGNOSIS — N400 Enlarged prostate without lower urinary tract symptoms: Secondary | ICD-10-CM | POA: Diagnosis present

## 2012-08-11 DIAGNOSIS — I214 Non-ST elevation (NSTEMI) myocardial infarction: Secondary | ICD-10-CM

## 2012-08-11 DIAGNOSIS — K219 Gastro-esophageal reflux disease without esophagitis: Secondary | ICD-10-CM | POA: Diagnosis present

## 2012-08-11 HISTORY — PX: LEFT HEART CATHETERIZATION WITH CORONARY ANGIOGRAM: SHX5451

## 2012-08-11 LAB — BASIC METABOLIC PANEL
BUN: 10 mg/dL (ref 6–23)
CO2: 29 mEq/L (ref 19–32)
Calcium: 8.9 mg/dL (ref 8.4–10.5)
Chloride: 102 mEq/L (ref 96–112)
Creatinine, Ser: 1.11 mg/dL (ref 0.50–1.35)
GFR calc Af Amer: >90 mL/min (ref >90)
GFR calc non Af Amer: 79 mL/min — ABNORMAL LOW (ref >90)
Glucose, Bld: 91 mg/dL (ref 70–99)
Potassium: 4 mEq/L (ref 3.5–5.1)
Sodium: 140 mEq/L (ref 135–145)

## 2012-08-11 LAB — CBC
HCT: 38.7 % — ABNORMAL LOW (ref 39.0–52.0)
Hemoglobin: 13 g/dL (ref 13.0–17.0)
MCH: 30.6 pg (ref 26.0–34.0)
MCHC: 33.6 g/dL (ref 30.0–36.0)
MCV: 91.1 fL (ref 78.0–100.0)
Platelets: 183 10*3/uL (ref 150–400)
RBC: 4.25 MIL/uL (ref 4.22–5.81)
RDW: 12.8 % (ref 11.5–15.5)
WBC: 7.6 10*3/uL (ref 4.0–10.5)

## 2012-08-11 LAB — LIPID PANEL
Cholesterol: 164 mg/dL (ref 0–200)
HDL: 47 mg/dL (ref >39)
LDL Cholesterol: 103 mg/dL — ABNORMAL HIGH (ref 0–99)
Total CHOL/HDL Ratio: 3.5 RATIO
Triglycerides: 72 mg/dL (ref <150)
VLDL: 14 mg/dL (ref 0–40)

## 2012-08-11 LAB — TROPONIN I
Troponin I: 4.42 ng/mL (ref <0.30)
Troponin I: 3.95 ng/mL (ref <0.30)
Troponin I: 2.02 ng/mL (ref <0.30)

## 2012-08-11 LAB — HEMOGLOBIN A1C
Hgb A1c MFr Bld: 5.7 % — ABNORMAL HIGH (ref <5.7)
Mean Plasma Glucose: 117 mg/dL — ABNORMAL HIGH (ref <117)

## 2012-08-11 LAB — HEPARIN LEVEL (UNFRACTIONATED)
Heparin Unfractionated: 0.33 IU/mL (ref 0.30–0.70)
Heparin Unfractionated: 0.62 IU/mL (ref 0.30–0.70)

## 2012-08-11 LAB — C-REACTIVE PROTEIN: CRP: <0.5 mg/dL — ABNORMAL LOW (ref <0.60)

## 2012-08-11 LAB — SEDIMENTATION RATE: Sed Rate: 30 mm/hr — ABNORMAL HIGH (ref 0–16)

## 2012-08-11 SURGERY — LEFT HEART CATHETERIZATION WITH CORONARY ANGIOGRAM
Anesthesia: LOCAL

## 2012-08-11 MED ORDER — HEPARIN SODIUM (PORCINE) 1000 UNIT/ML IJ SOLN
INTRAMUSCULAR | Status: AC
  Filled 2012-08-11: qty 1

## 2012-08-11 MED ORDER — HEPARIN (PORCINE) IN NACL 2-0.9 UNIT/ML-% IJ SOLN
INTRAMUSCULAR | Status: AC
  Filled 2012-08-11: qty 1000

## 2012-08-11 MED ORDER — HEART ATTACK BOUNCING BOOK
Freq: Once | Status: AC
  Administered 2012-08-11: 20:00:00 1
  Filled 2012-08-11: qty 1

## 2012-08-11 MED ORDER — VERAPAMIL HCL 2.5 MG/ML IV SOLN
INTRAVENOUS | Status: AC
  Filled 2012-08-11: qty 2

## 2012-08-11 MED ORDER — LIDOCAINE HCL (PF) 1 % IJ SOLN
INTRAMUSCULAR | Status: AC
  Filled 2012-08-11: qty 30

## 2012-08-11 MED ORDER — SODIUM CHLORIDE 0.9 % IV SOLN: 1.0000 mL/kg/h | INTRAVENOUS | Status: DC

## 2012-08-11 MED ORDER — MIDAZOLAM HCL 2 MG/2ML IJ SOLN
INTRAMUSCULAR | Status: AC
  Filled 2012-08-11: qty 2

## 2012-08-11 MED ORDER — MORPHINE SULFATE 2 MG/ML IJ SOLN
INTRAMUSCULAR | Status: AC
  Administered 2012-08-11: 2 mg via INTRAVENOUS
  Filled 2012-08-11: qty 1

## 2012-08-11 MED ORDER — SODIUM CHLORIDE 0.9 % IJ SOLN: 3.0000 mL | INTRAMUSCULAR | Status: DC | PRN

## 2012-08-11 MED ORDER — DIAZEPAM 5 MG PO TABS
5.0000 mg | ORAL_TABLET | ORAL | Status: AC
  Administered 2012-08-11: 5 mg via ORAL
  Filled 2012-08-11: qty 1

## 2012-08-11 MED ORDER — SODIUM CHLORIDE 0.9 % IV SOLN
1.0000 mL/kg/h | INTRAVENOUS | Status: AC
  Administered 2012-08-11: 1 mL/kg/h via INTRAVENOUS

## 2012-08-11 MED ORDER — SODIUM CHLORIDE 0.9 % IJ SOLN: 3.0000 mL | Freq: Two times a day (BID) | INTRAMUSCULAR | Status: DC

## 2012-08-11 MED ORDER — FENTANYL CITRATE 0.05 MG/ML IJ SOLN
INTRAMUSCULAR | Status: AC
  Filled 2012-08-11: qty 2

## 2012-08-11 MED ORDER — SODIUM CHLORIDE 0.9 % IV SOLN: 250.0000 mL | INTRAVENOUS | Status: DC | PRN

## 2012-08-11 MED ORDER — MORPHINE SULFATE 2 MG/ML IJ SOLN
2.0000 mg | INTRAMUSCULAR | Status: DC | PRN
  Administered 2012-08-12: 06:00:00 2 mg via INTRAVENOUS
  Filled 2012-08-11: qty 1

## 2012-08-11 NOTE — Progress Notes (Signed)
TR BAND REMOVAL  LOCATION:  right radial  DEFLATED PER PROTOCOL:  yes  TIME BAND OFF / DRESSING APPLIED:   right   SITE UPON ARRIVAL:   Level 0  SITE AFTER BAND REMOVAL:  Level 0  REVERSE ALLEN'S TEST:    positive  CIRCULATION SENSATION AND MOVEMENT:  Within Normal Limits  yes  COMMENTS:

## 2012-08-11 NOTE — CV Procedure (Signed)
   Cardiac Catheterization Procedure Note  Name: Richard Franco MRN: [id number] DOB: 1965-09-20  Procedure: Left Heart Cath, Selective Coronary Angiography, LV angiography  Indication: 47 yo WM with non STEMI.    Procedural Details: The right wrist was prepped, draped, and anesthetized with 1% lidocaine. Using the modified Seldinger technique, a 5 French sheath was introduced into the right radial artery. 3 mg of verapamil was administered through the sheath, weight-based unfractionated heparin was administered intravenously. Standard Judkins catheters were used for selective coronary angiography and left ventriculography. Catheter exchanges were performed over an exchange length guidewire. There were no immediate procedural complications. A TR band was used for radial hemostasis at the completion of the procedure.  The patient was transferred to the post catheterization recovery area for further monitoring.  Procedural Findings: Hemodynamics: AO 92/64 mean 77 mm Hg LV 92/16 mm Hg  Coronary angiography: Coronary dominance: left  Left mainstem:  Short, normal.  Left anterior descending (LAD): Normal.  Left circumflex (LCx): Dominant, normal.  Right coronary artery (RCA): small, nondominant, normal.  Left ventriculography: Left ventricular systolic function is normal, LVEF is estimated at 55-65%, there is no significant mitral regurgitation   Final Conclusions:   1. Normal coronary anatomy. 2. Normal LV function.  Recommendations: treat empirically for spasm.  Richard Franco 08/11/2012, 1:52 PM

## 2012-08-11 NOTE — H&P (View-Only) (Signed)
 Patient Name: Richard Franco Date of Encounter: 08/11/2012  Principal Problem:   NSTEMI (non-ST elevated myocardial infarction) Active Problems:   BPH (benign prostatic hyperplasia)   History of tobacco abuse - Quit October 2013   Pulmonary nodule   GERD (gastroesophageal reflux disease)   SUBJECTIVE NTG was increased & O2 added overnight due to chest pain, and patient currently without squeezing sharp pain as yesterday, though does have mild pleuritic pain with deep inspiration. Denies SOB currently.  Reports throbbing HA.  CURRENT MEDS . aerochamber plus with mask  1 each Other Once  . alfuzosin  10 mg Oral Daily  . aspirin EC  81 mg Oral Daily  . atorvastatin  80 mg Oral q1800  . azaTHIOprine  50 mg Oral Daily  . busPIRone  10 mg Oral BID  . FLUoxetine  60 mg Oral Daily  . lamoTRIgine  100 mg Oral BID  . metoprolol tartrate  25 mg Oral BID  . pantoprazole  40 mg Oral QAC lunch  . predniSONE  5 mg Oral QAC breakfast  . sodium chloride  3 mL Intravenous Q12H    OBJECTIVE  Filed Vitals:   08/11/12 0600 08/11/12 0700 08/11/12 0745 08/11/12 0800  BP: 91/46 98/54  96/57  Pulse: 62 58  57  Temp:   97.5 F (36.4 C)   TempSrc:   Oral   Resp: 12 12  10  Height:      Weight:      SpO2: 99% 100%  95%    Intake/Output Summary (Last 24 hours) at 08/11/12 0830 Last data filed at 08/11/12 0800  Gross per 24 hour  Intake 2113.41 ml  Output   1500 ml  Net 613.41 ml   Filed Weights   08/10/12 1730 08/10/12 2126  Weight: 183 lb 6.8 oz (83.2 kg) 182 lb 1.6 oz (82.6 kg)    PHYSICAL EXAM General: Pleasant, NAD. Neuro: Alert and oriented X 3. Moves all extremities spontaneously. Psych: Normal affect. HEENT:  Normal  Neck: Supple without bruits or JVD. Lungs:  Resp regular and unlabored, question of very soft b/l rales. Heart: distant heart sounds, RRR no s3, s4, or murmurs. Abdomen: Soft, non-tender, non-distended, BS + x 4.  Extremities: No clubbing, cyanosis or  edema. DP/PT/Radials 2+ and equal bilaterally.  Accessory Clinical Findings  CBC  Recent Labs  08/10/12 1452 08/10/12 1524 08/11/12 0640  WBC 7.1  --  7.6  HGB 13.7 13.9 13.0  HCT 40.2 41.0 38.7*  MCV 90.3  --  91.1  PLT 184  --  183   Basic Metabolic Panel  Recent Labs  08/10/12 1524 08/11/12 0640  NA 142 140  K 4.6 4.0  CL 103 102  CO2  --  29  GLUCOSE 103* 91  BUN 10 10  CREATININE 1.10 1.11  CALCIUM  --  8.9   Cardiac Enzymes  Recent Labs  08/10/12 1605 08/11/12 0017 08/11/12 0640  TROPONINI 3.91* 4.42* 3.95*   D-Dimer  Recent Labs  08/10/12 1543  DDIMER 2.99*   Hemoglobin A1C - pending Fasting Lipid Panel - pending  TELE - sinus rhythm  ECG - sinus bradycardia  Radiology/Studies Dg Chest 2 View 08/10/2012  IMPRESSION: No edema or consolidation.    Ct Angio Chest Pe W/cm &/or Wo Cm 08/10/2012   IMPRESSION:   1.  Negative for pulmonary embolus or other acute cardiopulmonary process.  2.  Stable scattered bilateral pulmonary nodules most consistent with an underlying infectious/inflammatory or granulomatous   process.   3.  Early paraseptal and centrilobular emphysema      ASSESSMENT AND PLAN 47 yo M with history of CP (Cath in 03/2012 without CAD), GERD, h/o abnormal chest CT (ground glass, scattered b/l pulm nodules), Anxiety, Depression, and Migraines presented on 08/10/12 with chest pain.  #NSTEMI: Trop peaked at 4.42, now trending down to 3.95. No h/o CAD, clean cath 03/2012. -Heparin gtt, NTG gtt -Daily ASA 81, lopessor 25mg BID, atorvastatin 80mg qHS -Risk Stratification: Lipid panel (LDL 100 in 2011) & A1c (5.1 in 2011) pending  -Plan for cath today  #B/l Pulmonary Nodules & ground glass opacities: Followed by Dr. Albon of pulmonary - as of 07/01/12 office note,  Ground glass opacities had resolved, but nodules persist; He is on prednisone 5mg daily. As of most recent visit, pulmonary remains unclear regarding patient's chronic dyspnea &  chest tightness -inform Dr. Albon of patient's admission  #GERD: continue PPI therapy  #BPH: Alfuzosin continued  #Anxiety/Depression: Patient's home mental health meds have been continued - Buspar, Thorazine, Fluoxetine, Lamotrigine  #Code Status: full code  #Dispo: pending clinical improvement  Signed, SHARDA, NEEMA MD, PGY 2  Patient seen, examined. Available data reviewed. Agree with findings, assessment, and plan as outlined by Dr Sharda. Pt had classic symptoms of ACS yesterday, very different from his chronic pleuritic pain. This in association with elevated troponin is highly suggestive of ACS or coronary vasospasm. Despite his unremarkable cath 4 months ago, I think cardiac cath is indicated. I have reviewed the risks, indications, and alternatives to cardiac cath and possible PCI with the patient and his family who agree to proceed. Will continue heparin and NTG until he goes to the cath lab. If coronaries are patent, will add oral vasodilators and treat him presumptively for Prinzmetal's angina. At the patient's request, we will contact his pulmonologist, Dr Albon.  Marquita Lias, M.D. 08/11/2012 10:41 AM   

## 2012-08-11 NOTE — Interval H&P Note (Signed)
History and Physical Interval Note:  08/11/2012 1:12 PM  Richard Franco  has presented today for surgery, with the diagnosis of cp  The various methods of treatment have been discussed with the patient and family. After consideration of risks, benefits and other options for treatment, the patient has consented to  Procedure(s): LEFT HEART CATHETERIZATION WITH CORONARY ANGIOGRAM (N/A) as a surgical intervention .  The patient's history has been reviewed, patient examined, no change in status, stable for surgery.  I have reviewed the patient's chart and labs.  Questions were answered to the patient's satisfaction.     Theron Arista [facility] 08/11/2012 1:12 PM

## 2012-08-11 NOTE — Care Management Note (Signed)
    Page 1 of 1   08/11/2012     10:00:51 AM   CARE MANAGEMENT NOTE 08/11/2012  Patient:  [facility]   Account Number:  [id number]  Date Initiated:  08/11/2012  Documentation initiated by:  Junius Creamer  Subjective/Objective Assessment:   adm w mi     Action/Plan:   lives w wife, pcp dr don Christell Constant   Anticipated DC Date:     Anticipated DC Plan:        DC Planning Services  CM consult      Choice offered to / List presented to:             Status of service:   Medicare Important Message given?   (If response is "NO", the following Medicare IM given date fields will be blank) Date Medicare IM given:   Date Additional Medicare IM given:    Discharge Disposition:    Per UR Regulation:  Reviewed for med. necessity/level of care/duration of stay  If discussed at Long Length of Stay Meetings, dates discussed:    Comments:  2/18 1000 debbie Tykee Heideman rn,bsn

## 2012-08-11 NOTE — Progress Notes (Signed)
ANTICOAGULATION CONSULT NOTE - Follow Up Consult  Pharmacy Consult for heparin Indication: chest pain/ACS  Allergies  Allergen Reactions  . Midodrine Hcl Swelling    Tongue swelling  . Sulfonamide Derivatives Rash         Patient Measurements: Height: 5' 8.11" (173 cm) Weight: 182 lb 1.6 oz (82.6 kg) IBW/kg (Calculated) : 68.65 Heparin Dosing Weight: 83kg  Vital Signs: Temp: 97.5 F (36.4 C) (02/18 0745) Temp src: Oral (02/18 0745) BP: 104/59 mmHg (02/18 1000) Pulse Rate: 66 (02/18 1000)  Labs:  Recent Labs  08/10/12 1452 08/10/12 1524 08/10/12 1605 08/10/12 1732 08/11/12 0017 08/11/12 0640 08/11/12 0930  HGB 13.7 13.9  --   --   --  13.0  --   HCT 40.2 41.0  --   --   --  38.7*  --   PLT 184  --   --   --   --  183  --   LABPROT  --   --   --  13.0  --   --   --   INR  --   --   --  0.99  --   --   --   HEPARINUNFRC  --   --   --   --  0.33  --  0.62  CREATININE  --  1.10  --   --   --  1.11  --   TROPONINI  --   --  3.91*  --  4.42* 3.95*  --     Estimated Creatinine Clearance: 88.3 ml/min (by C-G formula based on Cr of 1.11).   Assessment: 46 yom who came in with chest pain, unclear etiology. Positive troponins and is to go to cath today to determine cause (CAD vs vasospasms). On heparin per pharmacy in the meantime. First check on 1300units/hr in low end of range at 0.33. Rate increased to 1400units/hr and recheck is 0.62. CBC stable, no bleeding noted.  Goal of Therapy:  Heparin level 0.3-0.7 units/ml Monitor platelets by anticoagulation protocol: Yes   Plan:  1. Continue heparin at 1400units/hr 2. Follow up plans after cath 3. Daily CBC and heparin level if therapy to continue  Kao Berkheimer D. Shaaron Golliday, PharmD Clinical Pharmacist Pager: [phone] 08/11/2012 10:57 AM

## 2012-08-11 NOTE — Progress Notes (Signed)
Patient Name: Richard Franco Date of Encounter: 08/11/2012  Principal Problem:   NSTEMI (non-ST elevated myocardial infarction) Active Problems:   BPH (benign prostatic hyperplasia)   History of tobacco abuse - Quit October 2013   Pulmonary nodule   GERD (gastroesophageal reflux disease)   SUBJECTIVE NTG was increased & O2 added overnight due to chest pain, and patient currently without squeezing sharp pain as yesterday, though does have mild pleuritic pain with deep inspiration. Denies SOB currently.  Reports throbbing HA.  CURRENT MEDS . aerochamber plus with mask  1 each Other Once  . alfuzosin  10 mg Oral Daily  . aspirin EC  81 mg Oral Daily  . atorvastatin  80 mg Oral q1800  . azaTHIOprine  50 mg Oral Daily  . busPIRone  10 mg Oral BID  . FLUoxetine  60 mg Oral Daily  . lamoTRIgine  100 mg Oral BID  . metoprolol tartrate  25 mg Oral BID  . pantoprazole  40 mg Oral QAC lunch  . predniSONE  5 mg Oral QAC breakfast  . sodium chloride  3 mL Intravenous Q12H    OBJECTIVE  Filed Vitals:   08/11/12 0600 08/11/12 0700 08/11/12 0745 08/11/12 0800  BP: 91/46 98/54  96/57  Pulse: 62 58  57  Temp:   97.5 F (36.4 C)   TempSrc:   Oral   Resp: 12 12  10   Height:      Weight:      SpO2: 99% 100%  95%    Intake/Output Summary (Last 24 hours) at 08/11/12 0830 Last data filed at 08/11/12 0800  Gross per 24 hour  Intake 2113.41 ml  Output   1500 ml  Net 613.41 ml   Filed Weights   08/10/12 1730 08/10/12 2126  Weight: 183 lb 6.8 oz (83.2 kg) 182 lb 1.6 oz (82.6 kg)    PHYSICAL EXAM General: Pleasant, NAD. Neuro: Alert and oriented X 3. Moves all extremities spontaneously. Psych: Normal affect. HEENT:  Normal  Neck: Supple without bruits or JVD. Lungs:  Resp regular and unlabored, question of very soft b/l rales. Heart: distant heart sounds, RRR no s3, s4, or murmurs. Abdomen: Soft, non-tender, non-distended, BS + x 4.  Extremities: No clubbing, cyanosis or  edema. DP/PT/Radials 2+ and equal bilaterally.  Accessory Clinical Findings  CBC  Recent Labs  08/10/12 1452 08/10/12 1524 08/11/12 0640  WBC 7.1  --  7.6  HGB 13.7 13.9 13.0  HCT 40.2 41.0 38.7*  MCV 90.3  --  91.1  PLT 184  --  183   Basic Metabolic Panel  Recent Labs  08/10/12 1524 08/11/12 0640  NA 142 140  K 4.6 4.0  CL 103 102  CO2  --  29  GLUCOSE 103* 91  BUN 10 10  CREATININE 1.10 1.11  CALCIUM  --  8.9   Cardiac Enzymes  Recent Labs  08/10/12 1605 08/11/12 0017 08/11/12 0640  TROPONINI 3.91* 4.42* 3.95*   D-Dimer  Recent Labs  08/10/12 1543  DDIMER 2.99*   Hemoglobin A1C - pending Fasting Lipid Panel - pending  TELE - sinus rhythm  ECG - sinus bradycardia  Radiology/Studies Dg Chest 2 View 08/10/2012  IMPRESSION: No edema or consolidation.    Ct Angio Chest Pe W/cm &/or Wo Cm 08/10/2012   IMPRESSION:   1.  Negative for pulmonary embolus or other acute cardiopulmonary process.  2.  Stable scattered bilateral pulmonary nodules most consistent with an underlying infectious/inflammatory or granulomatous  process.   3.  Early paraseptal and centrilobular emphysema      ASSESSMENT AND PLAN 47 yo M with history of CP (Cath in 03/2012 without CAD), GERD, h/o abnormal chest CT (ground glass, scattered b/l pulm nodules), Anxiety, Depression, and Migraines presented on 08/10/12 with chest pain.  #NSTEMI: Trop peaked at 4.42, now trending down to 3.95. No h/o CAD, clean cath 03/2012. -Heparin gtt, NTG gtt -Daily ASA 81, lopessor 25mg  BID, atorvastatin 80mg  qHS -Risk Stratification: Lipid panel (LDL 100 in 2011) & A1c (5.1 in 2011) pending  -Plan for cath today  #B/l Pulmonary Nodules & ground glass opacities: Followed by Dr. Frederico Hamman of pulmonary - as of 07/01/12 office note,  Ground glass opacities had resolved, but nodules persist; He is on prednisone 5mg  daily. As of most recent visit, pulmonary remains unclear regarding patient's chronic dyspnea &  chest tightness -inform Dr. Frederico Hamman of patient's admission  #GERD: continue PPI therapy  #BPH: Alfuzosin continued  #Anxiety/Depression: Patient's home mental health meds have been continued - Buspar, Thorazine, Fluoxetine, Lamotrigine  #Code Status: full code  #Dispo: pending clinical improvement  Signed, Stacy Gardner MD, PGY 2  Patient seen, examined. Available data reviewed. Agree with findings, assessment, and plan as outlined by Dr Everardo Beals. Pt had classic symptoms of ACS yesterday, very different from his chronic pleuritic pain. This in association with elevated troponin is highly suggestive of ACS or coronary vasospasm. Despite his unremarkable cath 4 months ago, I think cardiac cath is indicated. I have reviewed the risks, indications, and alternatives to cardiac cath and possible PCI with the patient and his family who agree to proceed. Will continue heparin and NTG until he goes to the cath lab. If coronaries are patent, will add oral vasodilators and treat him presumptively for Prinzmetal's angina. At the patient's request, we will contact his pulmonologist, Dr Frederico Hamman.  Tonny Bollman, M.D. 08/11/2012 10:41 AM

## 2012-08-11 NOTE — Progress Notes (Signed)
ANTICOAGULATION CONSULT NOTE - Follow Up Consult  Pharmacy Consult for heparin Indication: NSTEMI  Labs:  Recent Labs  08/10/12 1452 08/10/12 1524 08/10/12 1605 08/10/12 1732 08/11/12 0017  HGB 13.7 13.9  --   --   --   HCT 40.2 41.0  --   --   --   PLT 184  --   --   --   --   LABPROT  --   --   --  13.0  --   INR  --   --   --  0.99  --   HEPARINUNFRC  --   --   --   --  0.33  CREATININE  --  1.10  --   --   --   TROPONINI  --   --  3.91*  --  4.42*    Assessment: 47yo male therapeutic on heparin with initial dosing for NSTEMI though at very low end of goal.  Goal of Therapy:  Heparin level 0.3-0.7 units/ml   Plan:  Will increase heparin gtt by 1 unit/kg/hr to 1400 units/hr and check level with next CE.  Colleen Can PharmD BCPS 08/11/2012,2:32 AM

## 2012-08-11 NOTE — Progress Notes (Signed)
08/11/2012 1300  To cath lab . Jonessa Triplett, Linnell Fulling

## 2012-08-12 ENCOUNTER — Encounter (HOSPITAL_COMMUNITY): Payer: Self-pay | Admitting: Physician Assistant

## 2012-08-12 ENCOUNTER — Telehealth: Payer: Self-pay | Admitting: Cardiology

## 2012-08-12 DIAGNOSIS — J849 Interstitial pulmonary disease, unspecified: Secondary | ICD-10-CM

## 2012-08-12 LAB — COMPREHENSIVE METABOLIC PANEL
ALT: 12 U/L (ref 0–53)
AST: 19 U/L (ref 0–37)
Albumin: 3.5 g/dL (ref 3.5–5.2)
Alkaline Phosphatase: 53 U/L (ref 39–117)
BUN: 9 mg/dL (ref 6–23)
CO2: 26 mEq/L (ref 19–32)
Calcium: 9.1 mg/dL (ref 8.4–10.5)
Chloride: 105 mEq/L (ref 96–112)
Creatinine, Ser: 1.19 mg/dL (ref 0.50–1.35)
GFR calc Af Amer: 84 mL/min — ABNORMAL LOW (ref >90)
GFR calc non Af Amer: 72 mL/min — ABNORMAL LOW (ref >90)
Glucose, Bld: 95 mg/dL (ref 70–99)
Potassium: 3.8 mEq/L (ref 3.5–5.1)
Sodium: 140 mEq/L (ref 135–145)
Total Bilirubin: 0.2 mg/dL — ABNORMAL LOW (ref 0.3–1.2)
Total Protein: 6.1 g/dL (ref 6.0–8.3)

## 2012-08-12 LAB — CBC
HCT: 37.8 % — ABNORMAL LOW (ref 39.0–52.0)
Hemoglobin: 12.8 g/dL — ABNORMAL LOW (ref 13.0–17.0)
MCH: 30.8 pg (ref 26.0–34.0)
MCHC: 33.9 g/dL (ref 30.0–36.0)
MCV: 90.9 fL (ref 78.0–100.0)
Platelets: 165 10*3/uL (ref 150–400)
RBC: 4.16 MIL/uL — ABNORMAL LOW (ref 4.22–5.81)
RDW: 12.8 % (ref 11.5–15.5)
WBC: 5.4 10*3/uL (ref 4.0–10.5)

## 2012-08-12 MED ORDER — ASPIRIN 81 MG PO TBEC: 81.0000 mg | DELAYED_RELEASE_TABLET | Freq: Every day | ORAL | Status: DC

## 2012-08-12 MED ORDER — NITROGLYCERIN 0.4 MG SL SUBL: 0.4000 mg | SUBLINGUAL_TABLET | SUBLINGUAL | Status: DC | PRN

## 2012-08-12 MED ORDER — ATORVASTATIN CALCIUM 20 MG PO TABS: 20.0000 mg | ORAL_TABLET | Freq: Every day | ORAL | Status: DC

## 2012-08-12 MED ORDER — DILTIAZEM HCL ER COATED BEADS 120 MG PO CP24: 120.0000 mg | ORAL_CAPSULE | Freq: Every day | ORAL | Status: DC

## 2012-08-12 MED ORDER — DILTIAZEM HCL ER COATED BEADS 120 MG PO CP24
120.0000 mg | ORAL_CAPSULE | Freq: Every day | ORAL | Status: DC
  Administered 2012-08-12: 08:00:00 120 mg via ORAL
  Filled 2012-08-12: qty 1

## 2012-08-12 MED ORDER — MECLIZINE HCL 25 MG PO TABS
25.0000 mg | ORAL_TABLET | Freq: Once | ORAL | Status: AC
  Administered 2012-08-12: 25 mg via ORAL
  Filled 2012-08-12 (×2): qty 1

## 2012-08-12 MED ORDER — SODIUM CHLORIDE 0.9 % IV SOLN
INTRAVENOUS | Status: DC
  Administered 2012-08-12: 11:00:00 via INTRAVENOUS

## 2012-08-12 NOTE — Telephone Encounter (Signed)
New Problem    TOC Appt on 2/26 @ 11:00

## 2012-08-12 NOTE — Progress Notes (Signed)
Subjective:  No chest pain.  Still having severe headache which he attributes to prior IV NTG. Also has a history of headaches and is on percocet at home from Dr. Newell Coral (multiple screws in neck). Rhythm is stable.  Objective:  Vital Signs in the last 24 hours: Temp:  [97.3 F (36.3 C)-98.1 F (36.7 C)] 98 F (36.7 C) (02/19 0420) Pulse Rate:  [57-102] 66 (02/19 0420) Resp:  [10-20] 18 (02/19 0420) BP: (93-114)/(51-68) 114/55 mmHg (02/19 0420) SpO2:  [95 %-98 %] 97 % (02/19 0420) Weight:  [180 lb 1.9 oz (81.7 kg)] 180 lb 1.9 oz (81.7 kg) (02/19 0019)  Intake/Output from previous day: 02/18 0701 - 02/19 0700 In: 1564.3 [P.O.:720; I.V.:844.3] Out: 2100 [Urine:2100] Intake/Output from this shift:    . aerochamber plus with mask  1 each Other Once  . alfuzosin  10 mg Oral Daily  . aspirin EC  81 mg Oral Daily  . atorvastatin  80 mg Oral q1800  . busPIRone  10 mg Oral BID  . diltiazem  120 mg Oral Daily  . FLUoxetine  60 mg Oral Daily  . lamoTRIgine  100 mg Oral BID  . pantoprazole  40 mg Oral QAC lunch  . predniSONE  5 mg Oral QAC breakfast      Physical Exam: The patient appears to be in no distress.  Head and neck exam reveals that the pupils are equal and reactive.  The extraocular movements are full.  There is no scleral icterus.  Mouth and pharynx are benign.  No lymphadenopathy.  No carotid bruits.  The jugular venous pressure is normal.  Thyroid is not enlarged or tender.  Chest is clear to percussion and auscultation.  No rales or rhonchi.  Expansion of the chest is symmetrical.  Heart reveals no abnormal lift or heave.  First and second heart sounds are normal.  There is no murmur gallop rub or click.  The abdomen is soft and nontender.  Bowel sounds are normoactive.  There is no hepatosplenomegaly or mass.  There are no abdominal bruits.  Extremities reveal no phlebitis or edema.  Pedal pulses are good.  There is no cyanosis or clubbing. Good right radial  pulse.  Neurologic exam is normal strength and no lateralizing weakness.  No sensory deficits.  Integument reveals no rash  Lab Results:  Recent Labs  08/11/12 0640 08/12/12 0600  WBC 7.6 5.4  HGB 13.0 12.8*  PLT 183 165    Recent Labs  08/11/12 0640 08/12/12 0600  NA 140 140  K 4.0 3.8  CL 102 105  CO2 29 26  GLUCOSE 91 95  BUN 10 9  CREATININE 1.11 1.19    Recent Labs  08/11/12 0640 08/11/12 1512  TROPONINI 3.95* 2.02*   Hepatic Function Panel  Recent Labs  08/12/12 0600  PROT 6.1  ALBUMIN 3.5  AST 19  ALT 12  ALKPHOS 53  BILITOT 0.2*    Recent Labs  08/11/12 0640  CHOL 164   No results found for this basename: PROTIME,  in the last 72 hours  Imaging: Imaging results have been reviewed  Cardiac Studies: Telemetry NSR Assessment/Plan:  1. NSTEMI with normal coronary arteries. 2. Abnormal chest CT scan--can follow up with Dr. Emmaline Kluver as outpatient. 3. Chronic headaches.  Plan:  OK for discharge today. Unlikely to tolerate Imdur long-term because of headaches so will use calcium channel blocker cardizem for prevention of coronary spasm and use SL NTG PRN.   LOS: 2 days  Cassell Clement 08/12/2012, 7:38 AM

## 2012-08-12 NOTE — Progress Notes (Signed)
Upon completing discharge paperwork, the patient reported dizziness like the room was spinning. No visual loss. + nausea, no vomiting. He received Percocet and morphine earlier today and reports that he only ate 1 pancake and did not eat much yesterday either. BP 122/54 and stable. The dizziness started several minutes after he was up and walking around. It has persisted now that he is laying in the bed but is slowly improving. Rhythm stable and HR stable. ? 2/2 narcotic medications on near empty stomach. Neuro exam completely nonfocal - no facial asymmetry, EOMI, PERRLA, strength 5/5 equal bilaterally U/LE, point-to-point in tact, visual fields in tact, A+Ox 3 and able to follow commands. D/w Dr. Patty Sermons. Will observe this morning, rx antivert, encourage him to eat more this morning, and gentle IV fluid hydration. Will reassess fitness for dc in PM. Ronie Spies PA-C

## 2012-08-12 NOTE — Progress Notes (Signed)
CARDIAC REHAB PHASE I   PRE:  Rate/Rhythm: 84SR  BP:  Supine:   Sitting:   Standing: 110/63   SaO2:   MODE:  Ambulation: 1000 ft   POST:  Rate/Rhythem: 98SR  BP:  Supine:   Sitting:   Standing: 120/72   SaO2: 0900-0952 Pt walked 1000 ft with steady gait. Tolerated well. Denied CP. Education completed with pt. Pt drinks about 8 regular MT Dews a day. Discussed caffeine and sugar content. Pt is drinking soda and not eating regular meals. States sometimes he does not eat until 1 pm. Gave heart healthy diet and reviewed. Discussed CRP 2 but pt declined. Wants to ex on his own.   Duanne Limerick

## 2012-08-12 NOTE — Discharge Summary (Addendum)
Discharge Summary   Patient ID: Richard Franco MRN: [id number], DOB/AGE: 02/19/1966 47 y.o. Admit date: 08/10/2012 D/C date:     08/12/2012  Primary Cardiologist: Shirlee Latch  Primary Discharge Diagnoses:  1. NSTEMI due to suspected coronary vasospasm - cath 08/11/12 with normal coronary arteries, EF 55-65% - placed on CCB (headache with nitrate) - statin initiated this admission - will need f/u labs 2. Abnormal chest CT scan - h/o such in 03/2012, this admission showing stable scattered bilat pulm nodules most c/w underlying infectious/inflammatory or granulomatous process - instructed to f/u PCP 3. Migraine headaches   Secondary Discharge Diagnoses:  1. Arthritis s/p neck surgery   2. Fibromyalgia   3. Depression   4. GERD (gastroesophageal reflux disease)   5. Testosterone deficiency   6. Anxiety   7. Complication of anesthesia - " difficult to urinate after "  8. Tobacco abuse  Hospital Course: Richard Franco is a 47 y/o M with history of chest pain (cath 03/2012 without obstructive disease), abnormal CT of chest (03/2012 with ground glass opacities and pulm nodules, previously treated with steroids), fibromyalgia, and migraine headaches who presented to [facility]10/12 with complaints of chest pain. He awoke that day around 11am with chest pain 10/10, not initially worse with inspiration or position. He went to his PCP and was referred to the ER. Initial POC troponin was 1. He was treated with NTG, morphine, and heparin with improvement in pain. EKG was without ST changes. D-dimer was positive but CT showed no PE (but was abnormal with stable scattered bilat pulm nodules most c/w underlying infectious/inflammatory or granulomatous process). Peak troponin was 4.42. The patient also reported feeling more fatigued. There was no rub on exam. Quick look echo showed no pericardial effusion and normal LV and RV size and function. He was admitted for further eval. Overnight, required  increase in NTG due to CP and by the morning was without squeezing CP but did have mild pleuritic pain with deep inspiration. He denied SOB. His symptoms were felt concerning for ACS or vasospasm. Cardiac cath 2/18 showed normal coronary arteries and EF estimated at 55-65%. Vasospasm was suspected. Due to headaches while on NTG, he was felt unlikely to tolerate Imdur a long term. He was placed on calcium channel blocker. He did have transient dizziness helped by meclizine, food, and IVF earlier felt related to the narcotic medication he had eaten on an empty stomach. Neuro exam was nonfocal. Dr. Patty Franco has seen and examined him today and feels he is stable for discharge. He will be instructed to follow up with PCP regarding his persistently abnormal chest CT. Due to his NSTEMI, he will have a TOC appointment scheduled.   Discharge Vitals: Blood pressure 114/55, pulse 66, temperature 98.9 F (37.2 C), temperature source Oral, resp. rate 18, height 5' 8.11" (1.73 m), weight 180 lb 1.9 oz (81.7 kg), SpO2 97.00%.  Labs: Lab Results  Component Value Date   WBC 5.4 08/12/2012   HGB 12.8* 08/12/2012   HCT 37.8* 08/12/2012   MCV 90.9 08/12/2012   PLT 165 08/12/2012     Recent Labs Lab 08/12/12 0600  NA 140  K 3.8  CL 105  CO2 26  BUN 9  CREATININE 1.19  CALCIUM 9.1  PROT 6.1  BILITOT 0.2*  ALKPHOS 53  ALT 12  AST 19  GLUCOSE 95    Recent Labs  08/10/12 1605 08/11/12 0017 08/11/12 0640 08/11/12 1512  TROPONINI 3.91* 4.42* 3.95* 2.02*  Lab Results  Component Value Date   CHOL 164 08/11/2012   HDL 47 08/11/2012   LDLCALC 103* 08/11/2012   TRIG 72 08/11/2012   Lab Results  Component Value Date   DDIMER 2.99* 08/10/2012    Diagnostic Studies/Procedures   Dg Chest 2 View 08/10/2012  *RADIOLOGY REPORT*  Clinical Data: Chest pain  CHEST - 2 VIEW  Comparison:  May 06, 2012  Findings:  There is an apparent nipple shadow on the left. Elsewhere lungs clear.  Heart size and pulmonary  vascularity are normal.  No adenopathy.  There is postoperative change in the lower cervical spine.  There is mid thoracic levoscoliosis. No pneumothorax.  IMPRESSION: No edema or consolidation.   Original Report Authenticated By: Bretta Bang, M.D.    Ct Angio Chest Pe W/cm &/or Wo Cm 08/10/2012  *RADIOLOGY REPORT*  Clinical Data: Pain, elevated D-dimer evaluate for pulmonary embolus  CT ANGIOGRAPHY CHEST  Technique:  Multidetector CT imaging of the chest using the standard protocol during bolus administration of intravenous contrast. Multiplanar reconstructed images including MIPs were obtained and reviewed to evaluate the vascular anatomy.  Contrast: OMNIPAQUE IOHEXOL 350 MG/ML SOLN  Comparison: Chest x-ray earlier today 08/10/2012 at 08/07/2018 07:00 p.m.; prior chest CT 06/22/2012  Findings:  Mediastinum: Unremarkable CT appearance of the thyroid gland.  No suspicious mediastinal or hilar adenopathy.  No soft tissue mass. The thoracic esophagus is within normal limits.  Heart/Vascular: Excellent opacification of the pulmonary arterial tree to the subsegmental level.  No central filling defect to suggest acute pulmonary embolus.  The main central pulmonary arteries within normal limits for size.  The heart is within normal limits for size.  No pericardial effusion.  Conventional three- vessel arch anatomy.  No aneurysmal dilatation or significant atherosclerotic vascular disease.  Lungs/Pleura: Early paraseptal and centrilobular emphysema.  Mild dependent atelectasis in the bilateral lower lobes.  No focal consolidation, pneumothorax or pleural effusion. Stable scattered bilateral pulmonary nodules the largest of which measures 6 mm in the right middle lobe.  The size, number and distribution have not significantly changed compared to 06/22/2012 remain consistent with an underlying infectious, inflammatory or granulomatous process.  Upper Abdomen: Unremarkable visualized upper abdomen.  Bones: No  acute fracture or aggressive appearing lytic or blastic osseous lesion.  IMPRESSION:  1.  Negative for pulmonary embolus or other acute cardiopulmonary process. 2.  Stable scattered bilateral pulmonary nodules most consistent with an underlying infectious/inflammatory or granulomatous process.  3.  Early paraseptal and centrilobular emphysema   Original Report Authenticated By: Malachy Moan, M.D.     Discharge Medications     Medication List    TAKE these medications       albuterol 108 (90 BASE) MCG/ACT inhaler  Commonly known as:  PROVENTIL HFA;VENTOLIN HFA  Inhale 2 puffs into the lungs every 6 (six) hours as needed for wheezing.     alfuzosin 10 MG 24 hr tablet  Commonly known as:  UROXATRAL  Take 10 mg by mouth daily.     aspirin 81 MG EC tablet  Take 1 tablet (81 mg total) by mouth daily.     atorvastatin 20 MG tablet  Commonly known as:  LIPITOR  Take 1 tablet (20 mg total) by mouth daily.     busPIRone 10 MG tablet  Commonly known as:  BUSPAR  Take 10 mg by mouth 2 (two) times daily.     chlorproMAZINE 25 MG tablet  Commonly known as:  THORAZINE  Take 1 tablet  by mouth as needed. For migraines     DEXILANT 60 MG capsule  Generic drug:  dexlansoprazole  TAKE 1 CAPSULE (60 MG TOTAL) BY MOUTH 2 (TWO) TIMES DAILY BEFORE A MEAL.     diltiazem 120 MG 24 hr capsule  Commonly known as:  CARDIZEM CD  Take 1 capsule (120 mg total) by mouth daily.     FLUoxetine 20 MG capsule  Commonly known as:  PROZAC  Take 60 mg by mouth daily.     lamoTRIgine 100 MG tablet  Commonly known as:  LAMICTAL  Take 100 mg by mouth 2 (two) times daily.     nitroGLYCERIN 0.4 MG SL tablet  Commonly known as:  NITROSTAT  Place 1 tablet (0.4 mg total) under the tongue every 5 (five) minutes as needed for chest pain (up to 3 doses).     PERCOCET 5-325 MG per tablet  Generic drug:  oxyCODONE-acetaminophen  Take 1-2 tablets by mouth every 4 (four) hours as needed. As needed for pain.       predniSONE 5 MG tablet  Commonly known as:  DELTASONE  Take 5 mg by mouth daily.     Spacer/Aero Chamber Mouthpiece Misc  1 applicator by Does not apply route 1 day or 1 dose.        Disposition   The patient will be discharged in stable condition to home. Discharge Orders   Future Appointments Provider Department Dept Phone   08/19/2012 11:00 AM Rosalio Macadamia, NP [facility] Main Office [place]) [phone]   08/31/2012 10:00 AM [facility] [address] [phone]   Patient to arrive 15 minutes prior to appointment time. No solid food 4 hours prior to exam. Liquids and Medicines are okay.   Future Orders Complete By Expires     Diet - low sodium heart healthy  As directed     Increase activity slowly  As directed     Comments:      No driving for 1 week. No lifting over 10 lbs for 2 weeks. No sexual activity for 2 weeks. You may return to work on 08/19/12. Keep procedure site clean & dry. If you notice increased pain, swelling, bleeding or pus, call/return!  You may shower, but no soaking baths/hot tubs/pools for 1 week.      Follow-up Information   Follow up with Rudi Heap, MD. (Follow up with primary care doctor/pulmonologist to discuss further evaluation/monitoring of your abnormal chest CT)    Contact information:   [address] [facility] [place] [address] [phone]       Follow up with Norma Fredrickson, NP. (08/19/12 at 11am)    Contact information:   [address] [place] [address] [phone]         Duration of Discharge Encounter: Greater than 30 minutes including physician and PA time.  Signed, Dayna Dunn PA-C 08/12/2012, 10:09 AM

## 2012-08-13 ENCOUNTER — Telehealth: Payer: Self-pay | Admitting: Pulmonary Disease

## 2012-08-13 NOTE — Telephone Encounter (Signed)
Called and spoke with pt and he stated that he was in the hospital and was told that he will need to follow up with Dr. Frederico Hamman.  i called and spoke with Dr. Frederico Hamman and she will call the pt later today.  Will forward message to her and she will discuss with pt the care needed.

## 2012-08-13 NOTE — Telephone Encounter (Signed)
Pt calls today b/c he was told the "ground glass is back in my lungs" I reread the impression of the CT angio to pt as per his request. He had a CT angio on 08/10/12 while in the hospital for a NSTEMI.  Aware of his appt on 2/26 with cardiology. Recommended that pt also call Dr. Liliane Channel office for follow-up appt.  He would like Dr. Vanetta Mulders his pulmonologist to know b/c he has a CT scan scheduled in March with her. Pt would like for Dr. Frederico Hamman to call him back about these results.  Will forward this to Dr. Cyndie Chime RN

## 2012-08-14 NOTE — Telephone Encounter (Signed)
I talked to the patient. He will have the repeat CT scan as scheduled.   Stanton Kidney, could you please schedule an appointment for [name] with me for March 26 and call him with the appointment time? We will review his CT scan results at that time.   Can we also order an immunoglobulin profile (quantative immunoglobulin IgG, IgM and IgA), immunoglobulin G subclasses and an alpha-1 anti-trypsin level?   I will refer the patient for further evaluation at [facility], Dr. Marcelene Butte; please schedule new patient visit with Dr. Jon Billings for June, July.   Thank you,

## 2012-08-18 ENCOUNTER — Telehealth: Payer: Self-pay | Admitting: Cardiology

## 2012-08-18 NOTE — Telephone Encounter (Signed)
Routing to Anne.

## 2012-08-18 NOTE — Telephone Encounter (Signed)
New Prob    Pt had a heart attack on 2/17 was released from hospital. C/o chest pain, heartburn feeling (got really bad this morning) it went away and came back a little while ago. Pt is concerned and would like to speak to nurse.

## 2012-08-18 NOTE — Telephone Encounter (Signed)
Spoke with pt. Pt states since earlier in the day today he has felt indigestion off and on. He has never had indigestion this long before. He has taken Tums and his reflux medication with some relief. His symptoms are 90% gone, but he still feels a little indigestion. I recommended pt report to ED for evaluation now.

## 2012-08-19 ENCOUNTER — Ambulatory Visit (INDEPENDENT_AMBULATORY_CARE_PROVIDER_SITE_OTHER): Payer: BC Managed Care – PPO | Admitting: Nurse Practitioner

## 2012-08-19 ENCOUNTER — Telehealth: Payer: Self-pay | Admitting: Pulmonary Disease

## 2012-08-19 ENCOUNTER — Encounter: Payer: Self-pay | Admitting: Nurse Practitioner

## 2012-08-19 VITALS — BP 124/76 | HR 85 | Ht 69.0 in | Wt 179.0 lb

## 2012-08-19 DIAGNOSIS — I214 Non-ST elevation (NSTEMI) myocardial infarction: Secondary | ICD-10-CM

## 2012-08-19 MED ORDER — DILTIAZEM HCL ER COATED BEADS 120 MG PO CP24: 240.0000 mg | ORAL_CAPSULE | Freq: Every day | ORAL | Status: DC

## 2012-08-19 MED ORDER — ISOSORBIDE MONONITRATE ER 30 MG PO TB24: 30.0000 mg | ORAL_TABLET | Freq: Every day | ORAL | Status: DC

## 2012-08-19 NOTE — Telephone Encounter (Signed)
Made in error. Richard Franco  °

## 2012-08-19 NOTE — Progress Notes (Signed)
Jeneen Montgomery Date of Birth: Nov 19, 1965 Medical Record #[id number]  History of Present Illness: Mr. Labella comes back today for a post hospital visit. He is seen for Dr. Shirlee Latch. He has multiple issues which include arthritis, fibromyalgia, depression, GERD, anxiety, migraines, and tobacco abuse. He has had a recent NSTEMI with cath showing normal coronary arteries, EF of 55 to 65%. This was felt to be due to vasospasm. D dimer was positive but CT was negative for PE. He has had a chronically abnormal chest CT. This past admission has shown stable scattered bilateral pulmonary nodules most c/w underlying infectious/inflammatory or granulomatous process - instructed to see his PCP. Not placed on nitrates due to headache with the NTG and his history of migraines.   He comes in today. He is here alone. He is not doing well. Was discharged last Wednesday. Has started having recurrent chest pain on Friday, Sunday and yesterday. Has taken NTG x 7 since discharge. Has chest pain today. Almost as severe as to what led him to the hospital last week. Has been going on for about 4 hours today. Has had some radiation down his arms but not today. He is NOT smoking. Has some discomfort if he takes a deep breath. Feels the best if he sits up straight. Also with some heartburn - he is on PPI therapy. Current episode is about a 6.   Current Outpatient Prescriptions on File Prior to Visit  Medication Sig Dispense Refill  . albuterol (PROVENTIL HFA;VENTOLIN HFA) 108 (90 BASE) MCG/ACT inhaler Inhale 2 puffs into the lungs every 6 (six) hours as needed for wheezing.  1 Inhaler  6  . alfuzosin (UROXATRAL) 10 MG 24 hr tablet Take 10 mg by mouth daily.      Marland Kitchen aspirin EC 81 MG EC tablet Take 1 tablet (81 mg total) by mouth daily.      Marland Kitchen atorvastatin (LIPITOR) 20 MG tablet Take 1 tablet (20 mg total) by mouth daily.  30 tablet  6  . busPIRone (BUSPAR) 10 MG tablet Take 10 mg by mouth 2 (two) times daily.      .  chlorproMAZINE (THORAZINE) 25 MG tablet Take 1 tablet by mouth as needed. For migraines      . DEXILANT 60 MG capsule TAKE 1 CAPSULE (60 MG TOTAL) BY MOUTH 2 (TWO) TIMES DAILY BEFORE A MEAL.  60 capsule  2  . FLUoxetine (PROZAC) 20 MG capsule Take 60 mg by mouth daily.      Marland Kitchen lamoTRIgine (LAMICTAL) 100 MG tablet Take 100 mg by mouth 2 (two) times daily.      . nitroGLYCERIN (NITROSTAT) 0.4 MG SL tablet Place 1 tablet (0.4 mg total) under the tongue every 5 (five) minutes as needed for chest pain (up to 3 doses).  25 tablet  4  . oxyCODONE-acetaminophen (PERCOCET) 5-325 MG per tablet Take 1-2 tablets by mouth every 4 (four) hours as needed. As needed for pain.      . predniSONE (DELTASONE) 5 MG tablet Take 5 mg by mouth daily.      Marland Kitchen Spacer/Aero Chamber Mouthpiece MISC 1 applicator by Does not apply route 1 day or 1 dose.  1 each  0   No current facility-administered medications on file prior to visit.    Allergies  Allergen Reactions  . Midodrine Hcl Swelling    Tongue swelling  . Sulfonamide Derivatives Rash         Past Medical History  Diagnosis Date  . Migraine  headache     pt takes inderal for headaches  . Arthritis     s/p neck surgery  . Fibromyalgia   . Depression   . GERD (gastroesophageal reflux disease)   . Testosterone deficiency   . Anxiety   . Complication of anesthesia     " difficult to urinate after "  . Abnormal chest CT 03/2012    a. Scattered patchy ground glass opacities and pulmonary nodules. b. F/u CT angio 07/2012: scattered stable bilat pulm nodules.  . NSTEMI (non-ST elevated myocardial infarction)     a. Normal cath 03/2012. b. 07/2012: troponin 4, normal cors, ?vasospasm.  . Tobacco abuse     Past Surgical History  Procedure Laterality Date  . Posterior cervical fusion/foraminotomy  05/09/2011    Procedure: POSTERIOR CERVICAL FUSION/FORAMINOTOMY LEVEL 1;  Surgeon: Hewitt Shorts;  Location: [facility];  Service: Neurosurgery;  Laterality: N/A;   C4/5 posterior arthrodesis with instrumentation   . Cystectomy    . Cardiac catheterization  03/25/2012  . Ankle fusion    . Video bronchoscopy  05/06/2012    Procedure: VIDEO BRONCHOSCOPY WITH FLUORO;  Surgeon: Roxine Caddy, MD;  Location: [facility];  Service: Cardiopulmonary;  Laterality: Bilateral;  . Fracture surgery  2006    left ankle    History  Smoking status  . Former Smoker -- 1.00 packs/day for 27 years  . Types: Cigarettes  . Quit date: 03/24/2012  Smokeless tobacco  . Former Neurosurgeon  . Types: Chew    History  Alcohol Use No    Family History  Problem Relation Age of Onset  . Diabetes Mother   . Coronary artery disease Paternal Uncle   . Asthma Sister     Review of Systems: The review of systems is per the HPI.  All other systems were reviewed and are negative.  Physical Exam: BP 124/76  Pulse 85  Ht 5\' 9"  (1.753 m)  Wt 179 lb (81.194 kg)  BMI 26.42 kg/m2 Patient is very pleasant and in no acute distress. Skin is warm and dry. Color is normal.  HEENT is unremarkable. Normocephalic/atraumatic. PERRL. Sclera are nonicteric. Neck is supple. No masses. No JVD. Lungs are clear. Cardiac exam shows a regular rate and rhythm. Abdomen is soft. Extremities are without edema. Gait and ROM are intact. No gross neurologic deficits noted.   LABORATORY DATA: EKG today shows sinus rhythm. No acute changes. Tracing was reviewed with Dr. Johney Frame today (DOD)   Chemistry      Component Value Date/Time   NA 140 08/12/2012 0600   K 3.8 08/12/2012 0600   CL 105 08/12/2012 0600   CO2 26 08/12/2012 0600   BUN 9 08/12/2012 0600   CREATININE 1.19 08/12/2012 0600   CREATININE 1.14 03/24/2012 1646      Component Value Date/Time   CALCIUM 9.1 08/12/2012 0600   ALKPHOS 53 08/12/2012 0600   AST 19 08/12/2012 0600   ALT 12 08/12/2012 0600   BILITOT 0.2* 08/12/2012 0600     Lab Results  Component Value Date   CKTOTAL 119 03/25/2012   CKMB 1.8 03/25/2012   TROPONINI 2.02* 08/11/2012    Lab Results  Component Value Date   WBC 5.4 08/12/2012   HGB 12.8* 08/12/2012   HCT 37.8* 08/12/2012   MCV 90.9 08/12/2012   PLT 165 08/12/2012   Lab Results  Component Value Date   CHOL 164 08/11/2012   HDL 47 08/11/2012   LDLCALC 103* 08/11/2012   TRIG  72 08/11/2012   CHOLHDL 3.5 08/11/2012     Assessment / Plan: 1. NSTEMI - with recurrent chest pain - this was felt to be due to vasospasm - Patient was treated with sl NTG x 1 here in the office with considerable improvement. I have discussed his case with Dr. Johney Frame. He has advised that we now add the nitrates. Double his CCB. I will see him back on Friday. He will continue to use the NTG if needed as well. He is to go to the ER if he fails to improve. He has some respiratory component - I am increasing his prednisone to 2 pills a day until I see him back as well.   2. HLD - on statin  3. Tobacco abuse - it is imperative that he not return to smoking  4. Abnormal CT - has seen pulmonary in the past.   I will see him back Friday.   Patient is agreeable to this plan and will call if any problems develop in the interim.

## 2012-08-19 NOTE — Telephone Encounter (Signed)
Dr. Frederico Hamman I have ordered all of the labs and referral to [facility]. Has the pt been notified of this? Do we need to call the patient. Carron Curie, CMA

## 2012-08-19 NOTE — Addendum Note (Signed)
Addended by: Darrell Jewel on: 08/19/2012 04:48 PM   Modules accepted: Orders

## 2012-08-19 NOTE — Telephone Encounter (Signed)
I discussed with the patient the paln; he will need to be notified on his appointments dates and times.   Thank you,

## 2012-08-19 NOTE — Patient Instructions (Addendum)
I want you to double the Diltiazem - take 2 pills each day  We are adding Imdur 30 mg a day  Increase your Prednisone to 2 a day until Friday  Continue to use your NTG if needed  I will see you Friday at 3:30  Call the [facility] office at [phone] if you have any questions, problems or concerns.

## 2012-08-19 NOTE — Addendum Note (Signed)
Addended by: Demetrios Loll on: 08/19/2012 12:00 PM   Modules accepted: Orders

## 2012-08-21 ENCOUNTER — Ambulatory Visit (INDEPENDENT_AMBULATORY_CARE_PROVIDER_SITE_OTHER): Payer: BC Managed Care – PPO | Admitting: Nurse Practitioner

## 2012-08-21 ENCOUNTER — Encounter: Payer: Self-pay | Admitting: Nurse Practitioner

## 2012-08-21 VITALS — BP 118/76 | HR 64 | Ht 69.0 in | Wt 177.0 lb

## 2012-08-21 DIAGNOSIS — I214 Non-ST elevation (NSTEMI) myocardial infarction: Secondary | ICD-10-CM

## 2012-08-21 MED ORDER — DILTIAZEM HCL ER COATED BEADS 240 MG PO CP24: 240.0000 mg | ORAL_CAPSULE | Freq: Every day | ORAL | Status: DC

## 2012-08-21 MED ORDER — ATORVASTATIN CALCIUM 20 MG PO TABS: 5.0000 mg | ORAL_TABLET | ORAL | Status: DC

## 2012-08-21 NOTE — Progress Notes (Signed)
Richard Franco Date of Birth: August 27, 1965 Medical Record #[id number]  History of Present Illness: Richard Franco comes back today for a follow up visit. He is seen for Dr. Shirlee Latch. He has multiple issues which include arthritis, fibromyalgia, depression, GERD, anxiety, migraines, and tobacco abuse. He has had a recent NSTEMI with cath showing normal coronary arteries, EF of 55 to 65%. This was felt to be due to vasospasm. D dimer was positive but CT was negative for PE. He has had a chronically abnormal chest CT. This past admission has shown stable scattered bilateral pulmonary nodules most c/w underlying infectious/inflammatory or granulomatous process - instructed to see his PCP. Not placed on nitrates due to headache with the NTG and his history of migraines.   I saw him earlier this week. He had had multiple bouts of chest pain. We increased his CCB and added low dose Imdur. Gave him NTG here in the office.   He comes back today. He is here alone. He says he is doing much better. He did not tolerate the Imdur due to terrific headache - said that was worse than his heart attack. Took NTG later on Wednesday. Has increased his CCB. He has had no more chest pain and feels better. He has stopped his statin due to myalgias but is willing to try at a lower dose/schedule.   Current Outpatient Prescriptions on File Prior to Visit  Medication Sig Dispense Refill  . albuterol (PROVENTIL HFA;VENTOLIN HFA) 108 (90 BASE) MCG/ACT inhaler Inhale 2 puffs into the lungs every 6 (six) hours as needed for wheezing.  1 Inhaler  6  . alfuzosin (UROXATRAL) 10 MG 24 hr tablet Take 10 mg by mouth daily.      Marland Kitchen aspirin EC 81 MG EC tablet Take 1 tablet (81 mg total) by mouth daily.      . busPIRone (BUSPAR) 10 MG tablet Take 10 mg by mouth 2 (two) times daily.      . chlorproMAZINE (THORAZINE) 25 MG tablet Take 1 tablet by mouth as needed. For migraines      . DEXILANT 60 MG capsule TAKE 1 CAPSULE (60 MG TOTAL) BY MOUTH  2 (TWO) TIMES DAILY BEFORE A MEAL.  60 capsule  2  . FLUoxetine (PROZAC) 20 MG capsule Take 60 mg by mouth daily.      Marland Kitchen lamoTRIgine (LAMICTAL) 100 MG tablet Take 100 mg by mouth 2 (two) times daily.      . nitroGLYCERIN (NITROSTAT) 0.4 MG SL tablet Place 1 tablet (0.4 mg total) under the tongue every 5 (five) minutes as needed for chest pain (up to 3 doses).  25 tablet  4  . oxyCODONE-acetaminophen (PERCOCET) 5-325 MG per tablet Take 1-2 tablets by mouth every 4 (four) hours as needed. As needed for pain.      . predniSONE (DELTASONE) 5 MG tablet Take 5 mg by mouth daily.      Marland Kitchen Spacer/Aero Chamber Mouthpiece MISC 1 applicator by Does not apply route 1 day or 1 dose.  1 each  0   No current facility-administered medications on file prior to visit.    Allergies  Allergen Reactions  . Midodrine Hcl Swelling    Tongue swelling  . Sulfonamide Derivatives Rash         Past Medical History  Diagnosis Date  . Migraine headache     pt takes inderal for headaches  . Arthritis     s/p neck surgery  . Fibromyalgia   . Depression   .  GERD (gastroesophageal reflux disease)   . Testosterone deficiency   . Anxiety   . Complication of anesthesia     " difficult to urinate after "  . Abnormal chest CT 03/2012    a. Scattered patchy ground glass opacities and pulmonary nodules. b. F/u CT angio 07/2012: scattered stable bilat pulm nodules.  . NSTEMI (non-ST elevated myocardial infarction)     a. Normal cath 03/2012. b. 07/2012: troponin 4, normal cors, ?vasospasm.  . Tobacco abuse     Past Surgical History  Procedure Laterality Date  . Posterior cervical fusion/foraminotomy  05/09/2011    Procedure: POSTERIOR CERVICAL FUSION/FORAMINOTOMY LEVEL 1;  Surgeon: Hewitt Shorts;  Location: [facility];  Service: Neurosurgery;  Laterality: N/A;  C4/5 posterior arthrodesis with instrumentation   . Cystectomy    . Cardiac catheterization  03/25/2012  . Ankle fusion    . Video bronchoscopy   05/06/2012    Procedure: VIDEO BRONCHOSCOPY WITH FLUORO;  Surgeon: Roxine Caddy, MD;  Location: [facility];  Service: Cardiopulmonary;  Laterality: Bilateral;  . Fracture surgery  2006    left ankle    History  Smoking status  . Former Smoker -- 1.00 packs/day for 27 years  . Types: Cigarettes  . Quit date: 03/24/2012  Smokeless tobacco  . Former Neurosurgeon  . Types: Chew    History  Alcohol Use No    Family History  Problem Relation Age of Onset  . Diabetes Mother   . Coronary artery disease Paternal Uncle   . Asthma Sister     Review of Systems: The review of systems is per the HPI.  All other systems were reviewed and are negative.  Physical Exam: BP 118/76  Pulse 64  Ht 5\' 9"  (1.753 m)  Wt 177 lb (80.287 kg)  BMI 26.13 kg/m2 Patient is very pleasant and in no acute distress. Skin is warm and dry. Color is normal.  HEENT is unremarkable. Normocephalic/atraumatic. PERRL. Sclera are nonicteric. Neck is supple. No masses. No JVD. Lungs are clear. Cardiac exam shows a regular rate and rhythm. Abdomen is soft. Extremities are without edema. Gait and ROM are intact. No gross neurologic deficits noted.  LABORATORY DATA:    Chemistry      Component Value Date/Time   NA 140 08/12/2012 0600   K 3.8 08/12/2012 0600   CL 105 08/12/2012 0600   CO2 26 08/12/2012 0600   BUN 9 08/12/2012 0600   CREATININE 1.19 08/12/2012 0600   CREATININE 1.14 03/24/2012 1646      Component Value Date/Time   CALCIUM 9.1 08/12/2012 0600   ALKPHOS 53 08/12/2012 0600   AST 19 08/12/2012 0600   ALT 12 08/12/2012 0600   BILITOT 0.2* 08/12/2012 0600     Lab Results  Component Value Date   CHOL 164 08/11/2012   HDL 47 08/11/2012   LDLCALC 103* 08/11/2012   TRIG 72 08/11/2012   CHOLHDL 3.5 08/11/2012    Assessment / Plan: 1. NSTEMI - felt to be due to vasospasm - he has not tolerated the Imdur but is doing better on the increased dose of CCB. I have left him on his current regimen. See Dr. Shirlee Latch back in a  month.  2. HLD - did not tolerate Lipitor 20 but is willing to take a lower dose - will try 5 mg on M-W-F. Recheck his labs on return.   Patient is agreeable to this plan and will call if any problems develop in the interim.

## 2012-08-21 NOTE — Patient Instructions (Addendum)
  Stay on your current medicines  Try taking just 1/4 of the Lipitor 20 mg on M-W-F  See Dr. Shirlee Latch in a month with fasting labs  Call the [facility] office at [phone] if you have any questions, problems or concerns.

## 2012-08-24 NOTE — ED Provider Notes (Signed)
Medical screening examination/treatment/procedure(s) were performed by non-physician practitioner and as supervising physician I was immediately available for consultation/collaboration.  Marwan T Powers, MD 08/24/12 0707 

## 2012-08-26 ENCOUNTER — Other Ambulatory Visit: Payer: Self-pay | Admitting: Gastroenterology

## 2012-08-31 ENCOUNTER — Ambulatory Visit (INDEPENDENT_AMBULATORY_CARE_PROVIDER_SITE_OTHER)
Admission: RE | Admit: 2012-08-31 | Discharge: 2012-08-31 | Disposition: A | Discharge status: Home / Self Care | Payer: BC Managed Care – PPO | Source: Ambulatory Visit | Attending: Pulmonary Disease | Admitting: Pulmonary Disease

## 2012-08-31 DIAGNOSIS — R918 Other nonspecific abnormal finding of lung field: Secondary | ICD-10-CM

## 2012-09-01 ENCOUNTER — Telehealth: Payer: Self-pay | Admitting: *Deleted

## 2012-09-01 NOTE — Telephone Encounter (Signed)
Changed Dexilant to Prevacid 30 mg bid  Works better and insurance pays for it

## 2012-09-02 ENCOUNTER — Telehealth: Payer: Self-pay | Admitting: Cardiology

## 2012-09-02 NOTE — Telephone Encounter (Signed)
Early today pt was feeling bad , join pain restless, he could not keep his legs  Still, also pt also had  some chest pain. This has been happening since he had his heart attack. Pt states he is feeling better now. Pt has an appointment to see Dr. Shirlee Latch in April.  Pt states he will wait to be seen then. Pt is aware to call back if this happened again.

## 2012-09-02 NOTE — Telephone Encounter (Signed)
Pt feels tired, legs feel funny, heart pounding, some chest pain  as well  pls call [phone]

## 2012-09-10 ENCOUNTER — Ambulatory Visit: Payer: BC Managed Care – PPO

## 2012-09-10 DIAGNOSIS — J849 Interstitial pulmonary disease, unspecified: Secondary | ICD-10-CM

## 2012-09-11 LAB — IGG, IGA, IGM
IgA: 145 mg/dL (ref 68–379)
IgG (Immunoglobin G), Serum: 748 mg/dL (ref 650–1600)
IgM, Serum: 237 mg/dL (ref 41–251)

## 2012-09-13 LAB — IGG 1, 2, 3, AND 4
IgG (Immunoglobin G), Serum: 697 mg/dL (ref 650–1600)
IgG Subclass 1: 454 mg/dL (ref 382–929)
IgG Subclass 2: 121 mg/dL — ABNORMAL LOW (ref 241–700)
IgG Subclass 3: 66 mg/dL (ref 22–178)
IgG Subclass 4: 12.3 mg/dL (ref 4.0–86.0)

## 2012-09-16 ENCOUNTER — Ambulatory Visit (INDEPENDENT_AMBULATORY_CARE_PROVIDER_SITE_OTHER): Payer: BC Managed Care – PPO | Admitting: Pulmonary Disease

## 2012-09-16 ENCOUNTER — Encounter: Payer: Self-pay | Admitting: Pulmonary Disease

## 2012-09-16 VITALS — BP 126/80 | HR 96 | Temp 97.7°F | Ht 69.0 in | Wt 179.8 lb

## 2012-09-16 DIAGNOSIS — R911 Solitary pulmonary nodule: Secondary | ICD-10-CM

## 2012-09-16 DIAGNOSIS — R918 Other nonspecific abnormal finding of lung field: Secondary | ICD-10-CM

## 2012-09-16 DIAGNOSIS — J8410 Pulmonary fibrosis, unspecified: Secondary | ICD-10-CM

## 2012-09-16 DIAGNOSIS — R799 Abnormal finding of blood chemistry, unspecified: Secondary | ICD-10-CM

## 2012-09-16 DIAGNOSIS — K219 Gastro-esophageal reflux disease without esophagitis: Secondary | ICD-10-CM

## 2012-09-16 DIAGNOSIS — R769 Abnormal immunological finding in serum, unspecified: Secondary | ICD-10-CM

## 2012-09-16 DIAGNOSIS — J849 Interstitial pulmonary disease, unspecified: Secondary | ICD-10-CM

## 2012-09-16 NOTE — Patient Instructions (Addendum)
We will schedule a follow up with Dr. Marchelle Gearing in 3 months.   Please decrease the prednisone to 2.5 mg daily for 2 weeks and then stop.   Please call with increased joint pain/calf pain.

## 2012-09-17 NOTE — Progress Notes (Signed)
CC: shortness of breath with exertion   HPI  47 year old male with PMH of pericarditis one year ago of unknown reason who persistently had CP since. Presented to the cardiologist with atypical chest pain, somewhat pleuritic in nature.  Cath was performed that was normal. CTA of the chest was done that revealed 3 <1 cm pulmonary nodules and multiple areas of GGO.  Patient has been more SOB over the past 3 months. On further enquires he states that he noticed shortness of breath over the past several years, at rest and with exertion, associated with chest tightnes. Extensive cardiology evaluation did not reveal a cardiac etiology. In average reports daily symptoms,  2-3 times a day, waking up approximately 2 nights per month with dyspnea.  During hospitalization he was initiated on steroids. He endorsed improved symptoms with steroids. Denies cough or wheezing, but does states that sometimes when he is short of breath he hears a noise coming from his throat.   04/02/2012 Steroids tapered off. He did not not report a significant difference off steroids.   04/16/2012 Was seen by Dr. Sherene Sires in clinic; CT chest showed persistent lung nodules and ground glass opacities in a different territory distribution. Steroids restarted at 20 mg daily; the patient was instructed to taper them to 10 mg.   04/30/12 The patient on 10 mg of prednisone daily;   05/06/12 Bronchoscopy with BAL, cell count and TBBx of the lingula  BAL cell count with elevated macrophages (possible COP, Hypersensitivity pneumonitis vs chronic microaspiration, possible Aluminum exposure)  RVP neg Viral culture neg  AFB smear neg, culture negative   Cytology/pathology benign with no evidence of aspiration or ILD    05/15/12  Remains on 10 mg steroids;  -methacholine challenge neg -barium swallow WNL  07/02/11 present visit -CT chest with resolution of GGO  - persistent nodules  -steroids decreased to 5 mg   09/16/12  -CT with no GGO,  lung nodules stable  -advised to stop steroids  Complains of shortness of breath at rest and with exertion; chest tightness but no wheezing; Denies cough, sputum production, hemoptysis, fever, chills, rash, PND, orthopnea, LE edema, calf tenderness or LE edema. Has had muscle pain after initiating statins; in the interim resolved.   Since the previous visit he presented to the ED and was found to have elevated troponins heart catheterization negative for CAD. He continues to have chest tightness, initiated cardizem and now reports feeling better.    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 No other autoimmune manifestations and no changes in environment. No drug use.  Occupation: toxic exposure to aluminum, steel, plastic dust Travels:no travels abroad TB exposure:denies Pets: none   Past Medical History  Diagnosis Date  . Migraine headache     pt takes inderal for headaches  . Arthritis     s/p neck surgery  . Fibromyalgia   . Depression   . GERD (gastroesophageal reflux disease)   . Testosterone deficiency   . Anxiety   . Complication of anesthesia     " difficult to urinate after "  . Abnormal chest CT 03/2012    a. Scattered patchy ground glass opacities and pulmonary nodules. b. F/u CT angio 07/2012: scattered stable bilat pulm nodules.  . NSTEMI (non-ST elevated myocardial infarction)     a. Normal cath 03/2012. b. 07/2012: troponin 4, normal cors, ?vasospasm.  . Tobacco abuse    Past Surgical History  Procedure Laterality Date  . Posterior cervical fusion/foraminotomy  05/09/2011    Procedure: POSTERIOR CERVICAL FUSION/FORAMINOTOMY LEVEL 1;  Surgeon: Hewitt Shorts;  Location: [facility];  Service: Neurosurgery;  Laterality: N/A;  C4/5 posterior arthrodesis with instrumentation   . Cystectomy    . Cardiac catheterization  03/25/2012  . Ankle fusion    . Video bronchoscopy  05/06/2012     Procedure: VIDEO BRONCHOSCOPY WITH FLUORO;  Surgeon: Roxine Caddy, MD;  Location: [facility];  Service: Cardiopulmonary;  Laterality: Bilateral;  . Fracture surgery  2006    left ankle   History  Substance Use Topics  . Smoking status: Former Smoker -- 1.00 packs/day for 27 years    Types: Cigarettes    Quit date: 03/24/2012  . Smokeless tobacco: Former Neurosurgeon    Types: Chew  . Alcohol Use: No   Family History  Problem Relation Age of Onset  . Diabetes Mother   . Coronary artery disease Paternal Uncle   . Asthma Sister      Allergies  Allergen Reactions  . Midodrine Hcl Swelling    Tongue swelling  . Sulfonamide Derivatives Rash        Review of Systems  Constitutional: Negative for diarrhea. Negative for fever.  HENT: Negative for ear pain, congestion, sore throat, sneezing and trouble swallowing. Negative for dental problem.   Respiratory: Per HPI   Cardiovascular: Per HPI Gastrointestinal: Negative for abdominal pain.  Musculoskeletal: Positive  for joint stiffness and discomfort in am.  Skin: Negative for rash.  Neurological: Negative for numbness.  Psychiatric/Behavioral: Positive for dysphoric mood. The patient is nervous/anxious.    Physical Exam BP 126/80  Pulse 96  Temp(Src) 97.7 F (36.5 C) (Oral)  Ht 5\' 9"  (1.753 m)  Wt 179 lb 12.8 oz (81.557 kg)  BMI 26.54 kg/m2  SpO2 98%  General: Comfortable HEENT ; pupils round and reactive to light; mild nasal mucosa edema and erythema; orophpharyngeal with no erythema, edema or exudate;  LAD: no cervical LAD  Cardiovascular: s1s2, no murmurs. Regular rate and rhythm.  Respiratory: Decreased breath sounds bilaterally with no wheezing No increased work of breathing.  Abdomen: soft NT ND BS+.   Extremities: no pedal edema. Homans negative  Central nervous system: Alert and oriented No focal neurological deficits.  PFTs 04/02/12         03/26/12 FVC 4.93 101%         5.1 105% (pre) 5.04 104% (post ) FEV1 4.03  102%       4.04 106% (pre) 4.26 111% (post) FEV1/FVC 82              DLCO                         90% TLC                           109% RV/TLC                     112%  MEP                              37% MIP                                69%  Results for orders  placed in visit on 09/10/12  IGG, IGA, IGM      Result Value Range   IgG (Immunoglobin G), Serum 748  650 - 1600 mg/dL   IgA 409  68 - 811 mg/dL   IgM, Serum 914  41 - 251 mg/dL  ALPHA-1 ANTITRYPSIN PHENOTYPE      Result Value Range   A-1 Antitrypsin       Results Received      IGG 1, 2, 3, AND 4      Result Value Range   IgG (Immunoglobin G), Serum 697  650 - 1600 mg/dL   IgG Subclass 1 782  956 - 929 mg/dL   IgG Subclass 2 213 (*) 241 - 700 mg/dL   IgG Subclass 3 66  22 - 178 mg/dL   IgG Subclass 4 08.6  4.0 - 86.0 mg/dL     CTA 57/01/4695 No CT evidence for acute pulmonary embolus.  Scattered focal areas ground-glass attenuation bilaterally with an  upper lobe and slightly peripheral predominance. This may be  secondary to an infectious or inflammatory alveolitis. Cryptogenic  pneumonia would be a consideration. The density of the patchy  opacities is not as confluent is typically seen for septic emboli.  Follow-up CT chest without contrast in 3 months is recommended to  determine whether these findings persist.  Bilateral pulmonary parenchymal nodules measuring up to 6.2 mm. If  the patient is at high risk for bronchogenic carcinoma, follow-up  chest CT at 6-12 months is recommended. If the patient is at low  risk for bronchogenic carcinoma, follow-up chest CT at 12 months is  recommended. This recommendation follows the consensus statement:  Guidelines for Management of Small Pulmonary Nodules Detected on CT  Scans: A Statement from the Fleischner Society as published in  Radiology 2005; 237:395-400.    SPEP note The possibility of a faint restricted band(s) cannot be completely excluded in the gamma  region.  IFE normal   CT 06/22/2012  Findings: Stable scattered lower pulmonary nodules, unchanged,  including:  --3 mm right upper lobe nodule (series 3/image 21)  --3 mm left upper lobe nodule (series 3/image 38)  --4 mm subpleural right upper lobe nodule (series 3/image 39)  --5 x 4 mm right middle lobe nodule (series 3/image 43)  Prior infectious/inflammatory opacities have resolved. No pleural  effusion or pneumothorax.  Visualized thyroid is unremarkable.  The heart is normal in size. No pericardial effusion.  No suspicious mediastinal or axillary lymphadenopathy.  Visualized upper abdomen is notable for a stable 1.5 cm probable  left renal cyst (series 2/image 72) and suspected layering  sludge/small gallstones in the gallbladder.  Prior anterior cervical fixation, incompletely visualized.  IMPRESSION:  Prior infectious/inflammatory opacities have resolved.  Stable scattered bilateral pulmonary nodules measuring up to 5 mm.  11-month stability has been demonstrated. Assuming the patient is  high risk for primary bronchogenic neoplasm, follow-up CT chest is  suggested in 3-9 months (6-12 months from initial CT).  This recommendation follows the consensus statement: Guidelines for  Management of Small Pulmonary Nodules Detected on CT Scans: A  Statement from the Fleischner Society as published in Radiology  2005; 237:395-400.  CT 08/31/12  1. Stable small pulmonary nodules. The largest measures 6 mm.  Stability for 5 months is reassuring. In a patient who is at low  risk for lung cancer stability for 12 months is advised. If the  patient is at increased risk for lung cancer stability then  establish stability for  6-12 months and then 18-24 months is  recommended. This recommendation follows the consensus statement:  Guidelines for Management of Small Pulmonary Nodules Detected on CT  Scans: A Statement from the Fleischner Society as published in  Radiology 2005; 237:395-400.   2. No acute cardiopulmonary abnormalities.    A/P GGO  I suspect a steroid responding disease like organizing pneumonia.  Unfortunately TBBx did not reveal an etiology; Microbiology so far negative. CT 06/22/12 with resolution of GGO, repeat 08/31/12 negative for GGO. Has been on steroids since 10/13.  I advised him to stop the steroids;   Joint stiffness and discomfort. I advised him to call with worsening symptoms after steroid discontinuation; in this situation I will refer him to rheumatology. Might benefit from repeat rheumatologic testing after steroid discontinuation.   Lung nodules 3-6.2 mm. For the lung nodules we will obtain a repeat CT chest in 6 months. He will need CT surveillance for 2 years to assess stability.    Chronic dyspnea and chest tightness;recent positive troponin with negative catheterization; symptoms and elevated troponin believed to be secondary to vasospasm; cont to follow up with cardiology. CTA negative for PE.   Occupational toxic exposure.  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 Aluminum dust exposure could give nodular lung disease/chronic dyspnea.  Aluminum plasma and urine level measurement ordered but results never received.    IgG slightly low.  IgG G2 subclass still low. Might benefit from follow up with allergy and immunology; no evidence of recurrent infections at this time.   Smoking cessation  He quit smoking 03/24/2012.   Testosterone deficiency Was seen by urology; defer further work up to them. No evidence of testicular malignancy.   GERD continue dexilant. Barium swallow WNL.    Decreased MIP and MEP I am unclear how to interpret this result since the PFTs are impressively good. It is possible that he has a component of diaphragmatic weaknesses given h/o C4-C5 surgery but very unlikely.   The patient is young with complex PMH.

## 2012-09-18 LAB — ALPHA-1 ANTITRYPSIN PHENOTYPE: A-1 Antitrypsin: 125 mg/dL (ref 83–199)

## 2012-09-24 ENCOUNTER — Encounter: Payer: Self-pay | Admitting: Cardiology

## 2012-09-24 ENCOUNTER — Ambulatory Visit (INDEPENDENT_AMBULATORY_CARE_PROVIDER_SITE_OTHER): Payer: BC Managed Care – PPO | Admitting: Cardiology

## 2012-09-24 VITALS — BP 118/64 | HR 86 | Ht 69.0 in | Wt 176.0 lb

## 2012-09-24 DIAGNOSIS — R079 Chest pain, unspecified: Secondary | ICD-10-CM

## 2012-09-24 DIAGNOSIS — E785 Hyperlipidemia, unspecified: Secondary | ICD-10-CM

## 2012-09-24 DIAGNOSIS — R918 Other nonspecific abnormal finding of lung field: Secondary | ICD-10-CM

## 2012-09-24 MED ORDER — DILTIAZEM HCL ER COATED BEADS 120 MG PO CP24: ORAL_CAPSULE | ORAL | Status: DC

## 2012-09-24 MED ORDER — PRAVASTATIN SODIUM 20 MG PO TABS: ORAL_TABLET | ORAL | Status: DC

## 2012-09-24 NOTE — Patient Instructions (Addendum)
Increase diltiazem to a total of 360mg  daily. I have sent in a prescription for a 120mg  tablet to take with your 240mg  tablet for a total of 360mg .   Stop atorvastatin.   Start pravachol 20mg  every OTHER  day.   Take coenzyme Q 10 200mg  daily. You can get this without a prescription.   Your physician recommends that you schedule a follow-up appointment in: 2 months with Dr Shirlee Latch.   Your physician recommends that you return for a FASTING lipid profile /liver profile in 2 months when you see Dr Shirlee Latch.

## 2012-09-25 DIAGNOSIS — E785 Hyperlipidemia, unspecified: Secondary | ICD-10-CM | POA: Insufficient documentation

## 2012-09-25 NOTE — Progress Notes (Signed)
Patient ID: Richard Franco, male   DOB: [birth year], 47 y.o.   MRN: [id number] PCP: Dr. Christell Constant  47 y.o. with history of chest pain and ground glass infiltrates on lung imaging was initially admitted with pleuritic chest pain and exertional dyspnea in 10/13.  He ended up having a left heart cath, which showed no significant coronary disease.  Echo was normal-appearing.  CTA of the chest, however, showed diffuse ground glass infiltrates.  Patient was treated with steroids and has been followed by pulmonary.  The steroids significantly improved his symptoms.  He had a transbronchial biopsy with no definite etiology lung findings noted, possible organizing pneumonia.  He was readmitted in 2/14 with chest pain and had troponin elevation to 4.42.  LHC was done again, again demonstrating normal coronaries.  Due to concern for coronary vasospasm, he was started on Imdur but was unable to tolerate due to headache so diltiazem CD was begun and titrated up to 240 mg daily.    Recently, chest pain has been improved but is not gone completely.  He has taken NTG 6-7 times since leaving the hospital in February.  Pain resolves with NTG.  He gets chest pain sometimes at complete random and sometimes with exertion.  It also can wake him at night.  He has almost daily episodes but most tend to be mild (definite improvement with diltiazem).  He is breathing better overall but still gets dyspnea walking more than 5-10 minutes.  He is now off prednisone.  Finally, he has been having myalgias with atorvastatin and is not taking it regularly.  Labs (10/13): K 3.9, creatinine 1.15, ESR 10, HCT 40.5, SPEP negative, CRP < 0.5, ANA negative, ANCA negative, ACE level normal.    Allergies (verified):  1) ! Sulfa  2) ! Midodrine Hcl   Past Medical History:  1. Headache  2. Osteoarthritis  3. Depression  4. Chronic cervical spine disease, status post multiple surgeries with chronic pain  5. Insomnia, late onset of sleep, and he  sleeps until late in the morning  6. Tobacco abuse: Quit 10/13.  7. GERD  8. Chest pain: ETT-myoview (5/07): No ischemia or infarction. ETT (3/11) normal.  LHC (10/13): EF 60%, normal coronary arteries.  Admit with CP in 2/14, troponin 4.4.  LHC (2/14) with normal coronaries.  Possible coronary vasospasm, improved with diltiazem.  9. Echo (10/13) with EF 55-60%, normal RV, normal valves.  10. Ground glass lung infiltrates: Admitted with pleuritic chest pain in 10/13.  CTA chest showed multiple areas of ground glass opacity.  Transbronchial biopsy unrevealing.  Improved with steroids.  Ddx = infection versus eosinophilic PNA versus organizing PNA versus pneumoconiosis.  11. Palpitations 12. 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  Married Quit smoking in 10/13.  Alcohol use-yes  Drug use-no  remote marijuana, cocaine  [place]. [place] animals.  One outside dog.   Review of Systems  All systems reviewed and negative except as per HPI.   Current Outpatient Prescriptions  Medication Sig Dispense Refill  . albuterol (PROVENTIL HFA;VENTOLIN HFA) 108 (90 BASE) MCG/ACT inhaler Inhale 2 puffs into the lungs every 6 (six) hours as needed for wheezing.  1 Inhaler  6  . alfuzosin (UROXATRAL) 10 MG 24 hr tablet Take 10 mg by mouth daily.      Marland Kitchen  aspirin EC 81 MG EC tablet Take 1 tablet (81 mg total) by mouth daily.      . busPIRone (BUSPAR) 10 MG tablet Take 10 mg by mouth 2 (two) times daily.      . chlorproMAZINE (THORAZINE) 25 MG tablet Take 1 tablet by mouth as needed. For migraines      . diltiazem (CARDIZEM CD) 240 MG 24 hr capsule Take 1 capsule (240 mg total) by mouth daily.  90 capsule  3  . FLUoxetine (PROZAC) 20 MG capsule Take 60 mg by mouth daily.      Marland Kitchen lamoTRIgine  (LAMICTAL) 100 MG tablet Take 100 mg by mouth 2 (two) times daily.      . lansoprazole (PREVACID) 30 MG capsule Take 30 mg by mouth 2 (two) times daily at 10 AM and 5 PM.      . nitroGLYCERIN (NITROSTAT) 0.4 MG SL tablet Place 1 tablet (0.4 mg total) under the tongue every 5 (five) minutes as needed for chest pain (up to 3 doses).  25 tablet  4  . oxyCODONE-acetaminophen (PERCOCET) 5-325 MG per tablet Take 1-2 tablets by mouth every 4 (four) hours as needed. As needed for pain.      Marland Kitchen Spacer/Aero Chamber Mouthpiece MISC 1 applicator by Does not apply route 1 day or 1 dose.  1 each  0  . Coenzyme Q10 200 MG capsule Take 1 capsule (200 mg total) by mouth daily.      Marland Kitchen diltiazem (CARDIZEM CD) 120 MG 24 hr capsule 1 daily with a 240mg  capsule for a total of 360mg   30 capsule  3  . pravastatin (PRAVACHOL) 20 MG tablet 1 every other day  30 tablet  3   No current facility-administered medications for this visit.   BP 118/64  Pulse 86  Ht 5\' 9"  (1.753 m)  Wt 176 lb (79.833 kg)  BMI 25.98 kg/m2  SpO2 98% General: NAD Neck: No JVD, no thyromegaly or thyroid nodule.  Lungs: Clear to auscultation bilaterally with normal respiratory effort. CV: Nondisplaced PMI.  Heart regular S1/S2, no S3/S4, no murmur.  No peripheral edema.  No carotid bruit.  Normal pedal pulses.  Abdomen: Soft, nontender, no hepatosplenomegaly, no distention.   Neurologic: Alert and oriented x 3.  Psych: Normal affect. Extremities: No clubbing or cyanosis.   Assessment/Plan: 1. Coronary vasospasm: Based on symptoms and elevated troponin with normal coronaries on cath, suspect coronary artery vasospasm.  He is improved on diltiazem but is still having mild chest pain.  He is unable to tolerate Imdur.  I will increase diltiazem CD to 360 mg daily.  2. Palpitations: Suspect premature beats.  Much improved with cutting back on caffeine.  3. Ground glass infiltrates: Undifferentiated lung disease.  Possible organizing pneumonia  He  has improved with steroids.  He has been seeing pulmonary.  Transbronchial biopsy was not revealing.  4. Hyperlipidemia: Myalgias with atorvastatin.  Will have him stop this and instead take pravastatin 20 mg every other day with coenzyme Q 10 200 mg daily.  Repeat lipids/LFTs in 2 months.   I will see him back in 2 months.   Marca Ancona 09/25/2012

## 2012-11-02 ENCOUNTER — Ambulatory Visit (INDEPENDENT_AMBULATORY_CARE_PROVIDER_SITE_OTHER): Payer: BC Managed Care – PPO | Admitting: Emergency Medicine

## 2012-11-02 ENCOUNTER — Encounter: Payer: Self-pay | Admitting: Emergency Medicine

## 2012-11-02 ENCOUNTER — Telehealth: Payer: Self-pay | Admitting: Pulmonary Disease

## 2012-11-02 ENCOUNTER — Ambulatory Visit (INDEPENDENT_AMBULATORY_CARE_PROVIDER_SITE_OTHER)
Admission: RE | Admit: 2012-11-02 | Discharge: 2012-11-02 | Disposition: A | Discharge status: Home / Self Care | Payer: BC Managed Care – PPO | Source: Ambulatory Visit | Attending: Emergency Medicine | Admitting: Emergency Medicine

## 2012-11-02 VITALS — BP 120/68 | HR 81 | Temp 98.2°F | Ht 69.0 in | Wt 178.4 lb

## 2012-11-02 DIAGNOSIS — R05 Cough: Secondary | ICD-10-CM

## 2012-11-02 MED ORDER — FLUTICASONE PROPIONATE 50 MCG/ACT NA SUSP: 2.0000 | Freq: Two times a day (BID) | NASAL | Status: DC

## 2012-11-02 MED ORDER — LORATADINE 10 MG PO TABS: 10.0000 mg | ORAL_TABLET | Freq: Every day | ORAL | Status: DC

## 2012-11-02 NOTE — Progress Notes (Signed)
HPI  47 year old male with PMH of pericarditis one year ago of unknown reason who persistently had CP since. Presented to the cardiologist with atypical chest pain, somewhat pleuritic in nature.  Cath was performed that was normal. CTA of the chest was done that revealed 3 <1 cm pulmonary nodules and multiple areas of GGO.  Patient has been more SOB over the past 3 months. On further enquires he states that he noticed shortness of breath over the past several years, at rest and with exertion, associated with chest tightnes. Extensive cardiology evaluation did not reveal a cardiac etiology. In average reports daily symptoms,  2-3 times a day, waking up approximately 2 nights per month with dyspnea.  During hospitalization he was initiated on steroids. He endorsed improved symptoms with steroids. Denies cough or wheezing, but does states that sometimes when he is short of breath he hears a noise coming from his throat.   04/02/2012 Steroids tapered off. He did not not report a significant difference off steroids.   04/16/2012 Was seen by Dr. Sherene Sires in clinic; CT chest showed persistent lung nodules and ground glass opacities in a different territory distribution. Steroids restarted at 20 mg daily; the patient was instructed to taper them to 10 mg.   04/30/12 The patient on 10 mg of prednisone daily;   05/06/12 Bronchoscopy with BAL, cell count and TBBx of the lingula  BAL cell count with elevated macrophages (possible COP, Hypersensitivity pneumonitis vs chronic microaspiration, possible Aluminum exposure)  RVP neg Viral culture neg  AFB smear neg, culture negative   Cytology/pathology benign with no evidence of aspiration or ILD    05/15/12  Remains on 10 mg steroids;  -methacholine challenge neg -barium swallow WNL  07/02/11 present visit -CT chest with resolution of GGO  - persistent nodules  -steroids decreased to 5 mg   09/16/12  -CT with no GGO, lung nodules stable  -advised to stop  steroids  Complains of shortness of breath at rest and with exertion; chest tightness but no wheezing; Denies cough, sputum production, hemoptysis, fever, chills, rash, PND, orthopnea, LE edema, calf tenderness or LE edema. Has had muscle pain after initiating statins; in the interim resolved.   Since the previous visit he presented to the ED and was found to have elevated troponins heart catheterization negative for CAD. He continues to have chest tightness, initiated cardizem and now reports feeling better.   Acute OV 11/02/12 --  Hx of dyspnea, chest discomfort felt to be coronary vasospasm, GGI on CT chest that resolved as of 08/2012 CT scan. Presents today c/o about 3 weeks of tachycardia w exertion, some cough for ~ a week, usually non-productive, chest tightness. Saw some dark specks in his mucous several times w cough 10/22/12, no over blood, none since then. He is having nasal congestion and allergy sx. No sick contacts. He stopped prednisone in mid March. Of note he has had progressive joint pain in his fingers and hands since stopping the prednisone.   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 No other autoimmune manifestations and no changes in environment. No drug use.   Occupation: toxic exposure to aluminum, steel, plastic dust Travels:no travels abroad TB exposure:denies Pets: none   Past Medical History  Diagnosis Date  . Migraine headache     pt takes inderal for headaches  . Arthritis     s/p neck surgery  . Fibromyalgia   . Depression   .  GERD (gastroesophageal reflux disease)   . Testosterone deficiency   . Anxiety   . Complication of anesthesia     " difficult to urinate after "  . Abnormal chest CT 03/2012    a. Scattered patchy ground glass opacities and pulmonary nodules. b. F/u CT angio 07/2012: scattered stable bilat pulm nodules.  . NSTEMI (non-ST elevated myocardial infarction)     a. Normal  cath 03/2012. b. 07/2012: troponin 4, normal cors, ?vasospasm.  . Tobacco abuse    Past Surgical History  Procedure Laterality Date  . Posterior cervical fusion/foraminotomy  05/09/2011    Procedure: POSTERIOR CERVICAL FUSION/FORAMINOTOMY LEVEL 1;  Surgeon: Hewitt Shorts;  Location: [facility];  Service: Neurosurgery;  Laterality: N/A;  C4/5 posterior arthrodesis with instrumentation   . Cystectomy    . Cardiac catheterization  03/25/2012  . Ankle fusion    . Video bronchoscopy  05/06/2012    Procedure: VIDEO BRONCHOSCOPY WITH FLUORO;  Surgeon: Roxine Caddy, MD;  Location: [facility];  Service: Cardiopulmonary;  Laterality: Bilateral;  . Fracture surgery  2006    left ankle   History  Substance Use Topics  . Smoking status: Former Smoker -- 1.00 packs/day for 27 years    Types: Cigarettes    Quit date: 03/24/2012  . Smokeless tobacco: Former Neurosurgeon    Types: Chew  . Alcohol Use: No   Family History  Problem Relation Age of Onset  . Diabetes Mother   . Coronary artery disease Paternal Uncle   . Asthma Sister      Allergies  Allergen Reactions  . Midodrine Hcl Swelling    Tongue swelling  . Sulfonamide Derivatives Rash        Review of Systems  Constitutional: Negative for diarrhea. Negative for fever.  HENT: Negative for ear pain, congestion, sore throat, sneezing and trouble swallowing. Negative for dental problem.   Respiratory: Per HPI   Cardiovascular: Per HPI Gastrointestinal: Negative for abdominal pain.  Musculoskeletal: Positive  for joint stiffness and discomfort in am.  Skin: Negative for rash.  Neurological: Negative for numbness.  Psychiatric/Behavioral: Positive for dysphoric mood. The patient is nervous/anxious.    Physical Exam BP 120/68  Pulse 81  Temp(Src) 98.2 F (36.8 C) (Oral)  Ht 5\' 9"  (1.753 m)  Wt 178 lb 6.4 oz (80.922 kg)  BMI 26.33 kg/m2  SpO2 98%  General: Comfortable HEENT ; pupils round and reactive to light; mild nasal  mucosa edema and erythema; orophpharyngeal with no erythema, edema or exudate;  LAD: no cervical LAD  Cardiovascular: s1s2, no murmurs. Regular rate and rhythm.  Respiratory: Decreased breath sounds bilaterally with no wheezing No increased work of breathing.  Abdomen: soft NT ND BS+.   Extremities: no pedal edema. Homans negative  Central nervous system: Alert and oriented No focal neurological deficits.  PFTs 04/02/12         03/26/12 FVC 4.93 101%         5.1 105% (pre) 5.04 104% (post ) FEV1 4.03 102%       4.04 106% (pre) 4.26 111% (post) FEV1/FVC 82              DLCO                         90% TLC                           109% RV/TLC  112%  MEP                              37% MIP                                69%  Results for orders placed in visit on 09/10/12  IGG, IGA, IGM      Result Value Range   IgG (Immunoglobin G), Serum 748  650 - 1600 mg/dL   IgA 161  68 - 096 mg/dL   IgM, Serum 045  41 - 251 mg/dL  ALPHA-1 ANTITRYPSIN PHENOTYPE      Result Value Range   A-1 Antitrypsin 125  83 - 199 mg/dL   Results Received SEE NOTE    IGG 1, 2, 3, AND 4      Result Value Range   IgG (Immunoglobin G), Serum 697  650 - 1600 mg/dL   IgG Subclass 1 409  811 - 929 mg/dL   IgG Subclass 2 914 (*) 241 - 700 mg/dL   IgG Subclass 3 66  22 - 178 mg/dL   IgG Subclass 4 78.2  4.0 - 86.0 mg/dL     CTA 95/11/2128 No CT evidence for acute pulmonary embolus.  Scattered focal areas ground-glass attenuation bilaterally with an  upper lobe and slightly peripheral predominance. This may be  secondary to an infectious or inflammatory alveolitis. Cryptogenic  pneumonia would be a consideration. The density of the patchy  opacities is not as confluent is typically seen for septic emboli.  Follow-up CT chest without contrast in 3 months is recommended to  determine whether these findings persist.  Bilateral pulmonary parenchymal nodules measuring up to 6.2 mm. If  the  patient is at high risk for bronchogenic carcinoma, follow-up  chest CT at 6-12 months is recommended. If the patient is at low  risk for bronchogenic carcinoma, follow-up chest CT at 12 months is  recommended. This recommendation follows the consensus statement:  Guidelines for Management of Small Pulmonary Nodules Detected on CT  Scans: A Statement from the Fleischner Society as published in  Radiology 2005; 237:395-400.    SPEP note The possibility of a faint restricted band(s) cannot be completely excluded in the gamma region.  IFE normal   CT 06/22/2012  Findings: Stable scattered lower pulmonary nodules, unchanged,  including:  --3 mm right upper lobe nodule (series 3/image 21)  --3 mm left upper lobe nodule (series 3/image 38)  --4 mm subpleural right upper lobe nodule (series 3/image 39)  --5 x 4 mm right middle lobe nodule (series 3/image 43)  Prior infectious/inflammatory opacities have resolved. No pleural  effusion or pneumothorax.  Visualized thyroid is unremarkable.  The heart is normal in size. No pericardial effusion.  No suspicious mediastinal or axillary lymphadenopathy.  Visualized upper abdomen is notable for a stable 1.5 cm probable  left renal cyst (series 2/image 72) and suspected layering  sludge/small gallstones in the gallbladder.  Prior anterior cervical fixation, incompletely visualized.  IMPRESSION:  Prior infectious/inflammatory opacities have resolved.  Stable scattered bilateral pulmonary nodules measuring up to 5 mm.  75-month stability has been demonstrated. Assuming the patient is  high risk for primary bronchogenic neoplasm, follow-up CT chest is  suggested in 3-9 months (6-12 months from initial CT).  This recommendation follows the consensus statement: Guidelines for  Management of Small Pulmonary Nodules Detected on  CT Scans: A  Statement from the Fleischner Society as published in Radiology  2005; 237:395-400.  CT 08/31/12  1. Stable  small pulmonary nodules. The largest measures 6 mm.  Stability for 5 months is reassuring. In a patient who is at low  risk for lung cancer stability for 12 months is advised. If the  patient is at increased risk for lung cancer stability then  establish stability for 6-12 months and then 18-24 months is  recommended. This recommendation follows the consensus statement:  Guidelines for Management of Small Pulmonary Nodules Detected on CT  Scans: A Statement from the Fleischner Society as published in  Radiology 2005; 237:395-400.  2. No acute cardiopulmonary abnormalities.     ASSESSMENT:  Cough Complex pt with hx occupational metal and dust exposure, pulm nodules and hx GGI that appears to have been steroid-responsive (by most recent CT 3/'14). Also has hx chest discomfort, suspected coronary vasospasm that makes interpretation of his sx difficult (he relates any discomfort in his chest to this). He complains today of chest tightness, accompanied by cough, productive of small specks on one occasion. Not clear that this relates to his prior process. He does have significant PND.  - trial treating allergic rhinitis to see if he improves - CXR today to assess for new infiltrates.  - consider auto-immune process given the joint pain, although his RF was normal on prior labs - consider repeat CT scan of the chest if no resolution to assess for GGI - he is to be referred to [facility] for occupational lung exposure per Dr Liliane Channel plans/  - follow with Dr MR to plan next steps

## 2012-11-02 NOTE — Telephone Encounter (Signed)
Called, spoke with pt.  Reports cough, some chest tightness, increased SOB with little activity, and increased HR with minimal activity.  Cough is mainly nonprod but was prod x 2 days ago with mucus that looked like "coffee grounds."  States symptoms have been gradually worsening since last OV with DA.  Has noticed some sweats but no fever or wheeze.  OV scheduled with RB today at 3:45 pm.  Pt aware.

## 2012-11-02 NOTE — Progress Notes (Signed)
Quick Note:  Spoke with patient informed him of results as listed below per RB Patient verbalized understanding and nothing further needed at this time. ______

## 2012-11-02 NOTE — Assessment & Plan Note (Signed)
Complex pt with hx occupational metal and dust exposure, pulm nodules and hx GGI that appears to have been steroid-responsive (by most recent CT 3/'14). Also has hx chest discomfort, suspected coronary vasospasm that makes interpretation of his sx difficult (he relates any discomfort in his chest to this). He complains today of chest tightness, accompanied by cough, productive of small specks on one occasion. Not clear that this relates to his prior process. He does have significant PND.  - trial treating allergic rhinitis to see if he improves - CXR today to assess for new infiltrates.  - consider auto-immune process given the joint pain, although his RF was normal on prior labs - consider repeat CT scan of the chest if no resolution to assess for GGI - he is to be referred to [facility] for occupational lung exposure per Dr Liliane Channel plans/  - follow with Dr MR to plan next steps

## 2012-11-02 NOTE — Patient Instructions (Addendum)
Please start loratadine 10mg  daily Please start fluticasone nasal spray, 2 sprays each side twice a day CXR today Follow with Dr Marchelle Gearing in 4 weeks to assess your symptoms. You may need to discuss repeating your CT scan of the chest at that time.

## 2012-11-09 ENCOUNTER — Telehealth: Payer: Self-pay | Admitting: Nurse Practitioner

## 2012-11-09 NOTE — Telephone Encounter (Signed)
appt made

## 2012-11-10 ENCOUNTER — Encounter: Payer: Self-pay | Admitting: Physician Assistant

## 2012-11-10 ENCOUNTER — Ambulatory Visit (INDEPENDENT_AMBULATORY_CARE_PROVIDER_SITE_OTHER): Payer: BC Managed Care – PPO | Admitting: Physician Assistant

## 2012-11-10 VITALS — BP 110/67 | HR 73 | Temp 97.5°F | Ht 69.0 in | Wt 176.3 lb

## 2012-11-10 DIAGNOSIS — M2550 Pain in unspecified joint: Secondary | ICD-10-CM

## 2012-11-10 NOTE — Patient Instructions (Signed)
`Sarcoidosis, Schaumann's Disease, Sarcoid of [place] Sarcoidosis appears briefly and heals naturally in 60 to 70 percent of cases, often without the patient knowing or doing anything about it. 20 to 30 percent of patients with sarcoidosis are left with some permanent lung damage. In 10 to 15 percent of the patients, sarcoidosis can become chronic (long lasting). When either the granulomas or fibrosis seriously affect the function of a vital organ (lungs, heart, nervous system, liver, or kidneys), sarcoidosis can be fatal. This occurs 5 to 10 percent of the time. No one can predict how sarcoidosis will progress in an individual patient. The symptoms the patient experiences, the caregiver's findings, and the patient's race can give some clues. Sarcoidosis was once considered a rare disease. We now know that it is a common chronic illness that appears all over the world. It is the most common of the fibrotic (scarring) lung disorders. Anyone can get sarcoidosis. It occurs in all races and in both sexes. The risk is greater if you are a young black adult, especially a black woman, or are of Scandinavian, German, Irish, or Puerto Rican origin. In sarcoidosis, small lumps (also called nodules or granulomas) develop in multiple organs of the body. These granulomas are small collections of inflamed cells. They commonly appear in the lungs. This is the most common organ affected. They also occur in the lymph nodes (your glands), skin, liver, and eyes. The granulomas vary in the amount of disease they produce from very little with no problems (symptoms) to causing severe illness. The cause of sarcoidosis is not known. It may be due to an abnormal immune reaction in the body. Most people will recover. A few people will develop long lasting conditions that may get worse. Women are affected more often than men. The majority of those affected are under forty years of age. Because we do not know the cause, we do not have ways to  prevent it. SYMPTOMS   Fever.  Loss of appetite.  Night sweats.  Joint pain.  Aching muscles Symptoms vary because the disease affects different parts of the body in different people. Most people who see their caregiver with sarcoidosis have lung problems. The first signs are usually a dry cough and shortness of breath. There may also be wheezing, chest pain, or a cough that brings up bloody mucus. In severe cases, lung function may become so poor that the person cannot perform even the simple routine tasks of daily life. Other symptoms of sarcoidosis are less common than lung symptoms. They can include:  Skin symptoms. Sarcoidosis can appear as a collection of tender, red bumps called erythema nodosum. These bumps usually occur on the face, shins, and arms. They can also occur as a scaly, purplish discoloration on the nose, cheeks, and ears. This is called lupus pernio. Less often, sarcoidosis causes cysts, pimples, or disfiguring over growths of skin. In many cases, the disfiguring over growths develop in areas of scars or tattoos.  Eye symptoms. These include redness, eye pain, and sensitivity to light.  Heart symptoms. These include irregular heartbeat and heart failure.  Other symptoms. A person may have paralyzed facial muscles, seizures, psychiatric symptoms, swollen salivary glands, or bone pain. DIAGNOSIS  Even when there are no symptoms, your caregiver can sometimes pick up signs of sarcoidosis during a routine examination, usually through a chest x-ray or when checking other complaints. The patient's age and race or ethnic group can raise an additional red flag that a sign or symptom could   be related to sarcoidosis.   Enlargement of the salivary or tear glands and cysts in bone tissue may also be caused by sarcoidosis.  You may have had a biopsy done that shows signs of sarcoidosis. A biopsy is a small tissue sample that is removed for laboratory testing. This tissue sample can  be taken from your lung, skin, lip, or another inflamed or abnormal area of the body.  You may have had an abnormal chest X-ray. Although you appear healthy, a chest X-ray ordered for other reasons may turn up abnormalities that suggest sarcoidosis.  Other tests may be needed. These tests may be done to rule out other illnesses or to determine the amount of organ damage caused by sarcoidosis. Some of the most common tests are:  Blood levels of calcium or angiotensin-converting enzyme may be high in people with sarcoidosis.  Blood tests to evaluate how well your liver is functioning.  Lung function tests to measure how well you are breathing.  A complete eye examination. TREATMENT  If sarcoidosis does not cause any problems, treatment may not be necessary. Your caregiver may decide to simply monitor your condition. As part of this monitoring process, you may have frequent office visits, follow-up chest X-rays, and tests of your lung function.If you have signs of moderate or severe lung disease, your doctor may recommend:  A corticosteroid drug, such as prednisone (sold under several brand names).  Corticosteroids also are used to treat sarcoidosis of the eyes, joints, skin, nerves, or heart.  Corticosteroid eye drops may be used for the eyes.  Over-the-counter medications like nonsteroidal anti-inflammatory drugs (NSAID) often are used to treat joint pain first before corticosteroids, which tend to have more side effects.  If corticosteroids are not effective or cause serious side effects, other drugs that alter or suppress the immune system may be used.  In rare cases, when sarcoidosis causes life-threatening lung disease, a lung transplant may be necessary. However, there is some risk that the new lungs also will be attacked by sarcoidosis. SEEK IMMEDIATE MEDICAL CARE IF:   You suffer from shortness of breath or a lingering cough.  You develop new problems that may be related to the  disease. Remember this disease can affect almost all organs of the body and cause many different problems. Document Released: 04/10/2004 Document Revised: 09/02/2011 Document Reviewed: 09/18/2005 [employer] Patient Information 2013 [employer].  

## 2012-11-10 NOTE — Progress Notes (Signed)
Subjective:     Patient ID: Richard Franco, male   DOB: 28-May-1966, 47 y.o.   MRN: [id number]  HPI Pt with a long chronic medical hx of joint pain He has been recently followed by pulmon. Due to SOB, cough, and abnl Xray He has has ground glass appearance on CXR but per pt had nl bronch. He took course of Pred. And felt much better with resolution of sx Now since he has stopped sx have returned He has appt at [facility] on June 4th Sx of joint pain are mainly of the smaller joints- fingers/wrist + FH of Rheum in mother  Review of Systems  All other systems reviewed and are negative.       Objective:   Physical Exam Pulses nl bilat at wrist FROM of all hand/wrist joints No edema noted No thenar wasting noted Good pulses/sensory     Assessment:     1. Multiple joint pain        Plan:     Given appearance of CXR- ? Possible sarcoidosis Will r/o Rheum- Arthritis panel pending Cont other meds Keep appt with [facility] pulm

## 2012-11-11 LAB — ARTHRITIS PANEL
Anti Nuclear Antibody(ANA): NEGATIVE
Rhuematoid fact SerPl-aCnc: <10 IU/mL (ref <=14)
Sed Rate: 11 mm/hr (ref 0–16)
Uric Acid, Serum: 5.4 mg/dL (ref 4.0–6.0)

## 2012-11-26 ENCOUNTER — Encounter: Payer: Self-pay | Admitting: Cardiology

## 2012-11-26 ENCOUNTER — Ambulatory Visit (INDEPENDENT_AMBULATORY_CARE_PROVIDER_SITE_OTHER): Payer: BC Managed Care – PPO | Admitting: Cardiology

## 2012-11-26 ENCOUNTER — Other Ambulatory Visit (INDEPENDENT_AMBULATORY_CARE_PROVIDER_SITE_OTHER): Payer: BC Managed Care – PPO

## 2012-11-26 VITALS — BP 118/70 | HR 66 | Ht 69.0 in | Wt 177.8 lb

## 2012-11-26 DIAGNOSIS — G8929 Other chronic pain: Secondary | ICD-10-CM | POA: Insufficient documentation

## 2012-11-26 DIAGNOSIS — M2550 Pain in unspecified joint: Secondary | ICD-10-CM

## 2012-11-26 DIAGNOSIS — R079 Chest pain, unspecified: Secondary | ICD-10-CM

## 2012-11-26 DIAGNOSIS — R918 Other nonspecific abnormal finding of lung field: Secondary | ICD-10-CM

## 2012-11-26 DIAGNOSIS — E785 Hyperlipidemia, unspecified: Secondary | ICD-10-CM

## 2012-11-26 DIAGNOSIS — M25569 Pain in unspecified knee: Secondary | ICD-10-CM

## 2012-11-26 DIAGNOSIS — M25559 Pain in unspecified hip: Secondary | ICD-10-CM

## 2012-11-26 LAB — HEPATIC FUNCTION PANEL
ALT: 20 U/L (ref 0–53)
AST: 21 U/L (ref 0–37)
Albumin: 4.1 g/dL (ref 3.5–5.2)
Alkaline Phosphatase: 62 U/L (ref 39–117)
Bilirubin, Direct: 0 mg/dL (ref 0.0–0.3)
Total Bilirubin: 0.5 mg/dL (ref 0.3–1.2)
Total Protein: 7 g/dL (ref 6.0–8.3)

## 2012-11-26 LAB — LIPID PANEL
Cholesterol: 157 mg/dL (ref 0–200)
HDL: 35.8 mg/dL — ABNORMAL LOW (ref >39.00)
LDL Cholesterol: 101 mg/dL — ABNORMAL HIGH (ref 0–99)
Total CHOL/HDL Ratio: 4
Triglycerides: 99 mg/dL (ref 0.0–149.0)
VLDL: 19.8 mg/dL (ref 0.0–40.0)

## 2012-11-26 NOTE — Progress Notes (Signed)
Patient ID: Richard Franco, male   DOB: 10-07-1965, 47 y.o.   MRN: [id number] PCP: Dr. Christell Constant  47 yo with history of chest pain and ground glass infiltrates on lung imaging was initially admitted with pleuritic chest pain and exertional dyspnea in 10/13.  He ended up having a left heart cath, which showed no significant coronary disease.  Echo was normal-appearing.  CTA of the chest, however, showed diffuse ground glass infiltrates.  Patient was treated with steroids and has been followed by pulmonary.  The steroids significantly improved his symptoms.  He had a transbronchial biopsy with no definite etiology lung findings noted, possible organizing pneumonia.  He was actually referred to pulmonary at [facility].  He says they just told him that whatever process it was that he had seemed to have resolved.  He was readmitted in 2/14 with chest pain and had troponin elevation to 4.42.  LHC was done again, again demonstrating normal coronaries.  Due to concern for coronary vasospasm, he was started on Imdur but was unable to tolerate due to headache so diltiazem CD was begun and titrated up to 360 mg daily.    Chest pain is rare now.  When it happens, it can be at rest or with exertion: no definite pattern. Pain resolves with NTG.  Main complaint is worsening joint pain.  This seems to be primarily in his hands but also shoulders, hips, knees.  His muscles are also sore.  CPK was normal in the past.  He stopped pravastatin but this did not help.    Labs (10/13): K 3.9, creatinine 1.15, ESR 10, HCT 40.5, SPEP negative, CRP < 0.5, ANA negative, ANCA negative, ACE level normal.   Labs (2/14): CPK normal, RF negative, slightly elevated ESR  Allergies (verified):  1) ! Sulfa  2) ! Midodrine Hcl   Past Medical History:  1. Headache  2. Osteoarthritis  3. Depression  4. Chronic cervical spine disease, status post multiple surgeries with chronic pain  5. Insomnia, late onset of sleep, and he sleeps until late in  the morning  6. Tobacco abuse: Quit 10/13.  7. GERD  8. Chest pain: ETT-myoview (5/07): No ischemia or infarction. ETT (3/11) normal.  LHC (10/13): EF 60%, normal coronary arteries.  Admit with CP in 2/14, troponin 4.4.  LHC (2/14) with normal coronaries.  Possible coronary vasospasm, improved with diltiazem.  9. Echo (10/13) with EF 55-60%, normal RV, normal valves.  10. Ground glass lung infiltrates: Admitted with pleuritic chest pain in 10/13.  CTA chest showed multiple areas of ground glass opacity.  Transbronchial biopsy unrevealing.  Improved with steroids.  Ddx = infection versus eosinophilic PNA versus organizing PNA versus pneumoconiosis.  11. Palpitations 12. Hyperlipidemia 13. 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  Married Quit smoking in 10/13.  Alcohol use-yes  Drug use-no  remote marijuana, cocaine  [place]. [place] animals.  One outside dog.   Current Outpatient Prescriptions  Medication Sig Dispense Refill  . albuterol (PROVENTIL HFA;VENTOLIN HFA) 108 (90 BASE) MCG/ACT inhaler Inhale 2 puffs into the lungs every 6 (six) hours as needed for wheezing.  1 Inhaler  6  . alfuzosin (UROXATRAL) 10 MG 24 hr tablet Take 10 mg by mouth daily.      Marland Kitchen aspirin  EC 81 MG EC tablet Take 1 tablet (81 mg total) by mouth daily.      . busPIRone (BUSPAR) 10 MG tablet Take 10 mg by mouth 2 (two) times daily.      . chlorproMAZINE (THORAZINE) 25 MG tablet Take 1 tablet by mouth as needed. For migraines      . diltiazem (CARDIZEM CD) 120 MG 24 hr capsule 1 daily with a 240mg  capsule for a total of 360mg   30 capsule  3  . diltiazem (CARDIZEM CD) 240 MG 24 hr capsule Take 1 capsule (240 mg total) by mouth daily.  90 capsule  3  . FLUoxetine (PROZAC) 20 MG  capsule Take 60 mg by mouth daily.      . fluticasone (FLONASE) 50 MCG/ACT nasal spray Place 2 sprays into the nose 2 (two) times daily.  16 g  6  . lamoTRIgine (LAMICTAL) 100 MG tablet Take 100 mg by mouth 2 (two) times daily.      . lansoprazole (PREVACID) 30 MG capsule Take 30 mg by mouth 2 (two) times daily at 10 AM and 5 PM.      . loratadine (CLARITIN) 10 MG tablet Take 1 tablet (10 mg total) by mouth daily.  30 tablet  6  . nitroGLYCERIN (NITROSTAT) 0.4 MG SL tablet Place 1 tablet (0.4 mg total) under the tongue every 5 (five) minutes as needed for chest pain (up to 3 doses).  25 tablet  4  . oxyCODONE-acetaminophen (PERCOCET) 5-325 MG per tablet Take 1-2 tablets by mouth every 4 (four) hours as needed. As needed for pain.      . pravastatin (PRAVACHOL) 20 MG tablet 1 every other day  30 tablet  3  . Coenzyme Q10 200 MG capsule Take 1 capsule (200 mg total) by mouth daily.       No current facility-administered medications for this visit.   BP 118/70  Pulse 66  Ht 5\' 9"  (1.753 m)  Wt 177 lb 12.8 oz (80.65 kg)  BMI 26.24 kg/m2  SpO2 98% General: NAD Neck: No JVD, no thyromegaly or thyroid nodule.  Lungs: Clear to auscultation bilaterally with normal respiratory effort. CV: Nondisplaced PMI.  Heart regular S1/S2, no S3/S4, no murmur.  No peripheral edema.  No carotid bruit.  Normal pedal pulses.  Abdomen: Soft, nontender, no hepatosplenomegaly, no distention.   Neurologic: Alert and oriented x 3.  Psych: Normal affect. Extremities: No clubbing or cyanosis.   Assessment/Plan: 1. Coronary vasospasm: Based on symptoms and elevated troponin with normal coronaries on cath, suspect coronary artery vasospasm.  He is significantly improved on diltiazem 360 mg daily.  He is unable to tolerate Imdur.  2. Palpitations: Suspect premature beats.  Much improved with cutting back on caffeine.  3. Ground glass infiltrates: Undifferentiated lung disease.  Possible organizing pneumonia  He has  improved with steroids.  He has been seeing pulmonary.  Transbronchial biopsy was not revealing. Seems to have resolved according to pulmonary consult at [facility].  4. Hyperlipidemia: Off pravastatin now due to myalgias with no improvement in myalgias.  5. Myalgias/polyarthralgias: Rheumatologic serological workup so far negative (did have mildly elevated ESR at one point).  He is quite symptomatic.  I will refer him to rheumatology.   Marca Ancona 11/26/2012

## 2012-11-26 NOTE — Patient Instructions (Addendum)
You have been referred to Dr Dierdre Forth for evaluation and treatment of your joint pain.   Your physician wants you to follow-up in: 6 months with Dr Shirlee Latch. (December 2014).You will receive a reminder letter in the mail two months in advance. If you don't receive a letter, please call our office to schedule the follow-up appointment.

## 2012-12-11 ENCOUNTER — Telehealth: Payer: Self-pay | Admitting: Cardiology

## 2012-12-11 NOTE — Telephone Encounter (Signed)
New problem     Pt calling to see when his rheumatology appt will be scheduled

## 2012-12-14 NOTE — Telephone Encounter (Signed)
Waiting on Dr. Dierdre Forth office to call me with an appointment.

## 2012-12-15 ENCOUNTER — Ambulatory Visit: Payer: BC Managed Care – PPO | Admitting: Internal Medicine

## 2012-12-15 NOTE — Telephone Encounter (Signed)
Does the patient know about the appt?

## 2012-12-15 NOTE — Telephone Encounter (Signed)
Appointment with Dr. Dierdre Forth on 01/13/13 at 9 am.

## 2012-12-15 NOTE — Telephone Encounter (Signed)
Patient is aware of appointment °

## 2012-12-21 ENCOUNTER — Ambulatory Visit: Payer: BC Managed Care – PPO | Admitting: Internal Medicine

## 2012-12-23 ENCOUNTER — Encounter: Payer: Self-pay | Admitting: Internal Medicine

## 2013-01-04 ENCOUNTER — Encounter: Payer: Self-pay | Admitting: Nurse Practitioner

## 2013-01-04 ENCOUNTER — Ambulatory Visit (INDEPENDENT_AMBULATORY_CARE_PROVIDER_SITE_OTHER): Payer: BC Managed Care – PPO

## 2013-01-04 ENCOUNTER — Ambulatory Visit: Payer: BC Managed Care – PPO | Admitting: Nurse Practitioner

## 2013-01-04 ENCOUNTER — Ambulatory Visit (INDEPENDENT_AMBULATORY_CARE_PROVIDER_SITE_OTHER): Payer: BC Managed Care – PPO | Admitting: Nurse Practitioner

## 2013-01-04 VITALS — BP 103/70 | HR 64 | Temp 97.3°F | Ht 69.0 in | Wt 176.0 lb

## 2013-01-04 DIAGNOSIS — R109 Unspecified abdominal pain: Secondary | ICD-10-CM

## 2013-01-04 DIAGNOSIS — N390 Urinary tract infection, site not specified: Secondary | ICD-10-CM

## 2013-01-04 LAB — POCT UA - MICROSCOPIC ONLY
Casts, Ur, LPF, POC: NEGATIVE
Crystals, Ur, HPF, POC: NEGATIVE
Mucus, UA: NEGATIVE
Yeast, UA: NEGATIVE

## 2013-01-04 LAB — POCT CBC
Granulocyte percent: 72 %G (ref 37–80)
HCT, POC: 41.6 % — AB (ref 43.5–53.7)
Hemoglobin: 14.5 g/dL (ref 14.1–18.1)
Lymph, poc: 1.3 (ref 0.6–3.4)
MCH, POC: 30.9 pg (ref 27–31.2)
MCHC: 34.8 g/dL (ref 31.8–35.4)
MCV: 88.6 fL (ref 80–97)
MPV: 9.3 fL (ref 0–99.8)
POC Granulocyte: 3.6 (ref 2–6.9)
POC LYMPH PERCENT: 26.5 %L (ref 10–50)
Platelet Count, POC: 152 10*3/uL (ref 142–424)
RBC: 4.7 M/uL (ref 4.69–6.13)
RDW, POC: 13.1 %
WBC: 5 10*3/uL (ref 4.6–10.2)

## 2013-01-04 LAB — POCT URINALYSIS DIPSTICK
Bilirubin, UA: NEGATIVE
Glucose, UA: NEGATIVE
Ketones, UA: NEGATIVE
Leukocytes, UA: NEGATIVE
Nitrite, UA: NEGATIVE
Protein, UA: NEGATIVE
Spec Grav, UA: <=1.005
Urobilinogen, UA: NEGATIVE
pH, UA: 7.5

## 2013-01-04 MED ORDER — CIPROFLOXACIN HCL 500 MG PO TABS: 500.0000 mg | ORAL_TABLET | Freq: Two times a day (BID) | ORAL | Status: DC

## 2013-01-04 NOTE — Progress Notes (Signed)
Subjective:    Patient ID: Richard Franco, male    DOB: 04/26/1966, 47 y.o.   MRN: [id number]  HPI Patient here today c/o abdominal pain- burning sensation- mainly in lower abdomen- Last BM- last night- Started about 4 days ago an dis constant now- tender when pants are tight- Can't straighten up all the way when walking due to pain.    Review of Systems  Constitutional: Negative for fever and chills.  Respiratory: Negative.   Cardiovascular: Negative.   Gastrointestinal: Positive for abdominal pain (left lower abdomen). Negative for diarrhea and constipation.  Genitourinary: Positive for urgency and frequency.       Objective:   Physical Exam  Constitutional: He is oriented to person, place, and time. He appears well-developed and well-nourished.  Cardiovascular: Normal rate and normal heart sounds.   Pulmonary/Chest: Effort normal and breath sounds normal.  Abdominal: Soft. Bowel sounds are normal. He exhibits no mass. There is tenderness (bil lower quadrant). There is no rebound and no guarding.  Neurological: He is alert and oriented to person, place, and time.    BP 103/70  Pulse 64  Temp(Src) 97.3 F (36.3 C) (Oral)  Ht 5\' 9"  (1.753 m)  Wt 176 lb (79.833 kg)  BMI 25.98 kg/m2 KUB- Mild amount of stool in colon  Results for orders placed in visit on 01/04/13  POCT CBC      Result Value Range   WBC 5.0  4.6 - 10.2 K/uL   Lymph, poc 1.3  0.6 - 3.4   POC LYMPH PERCENT 26.5  10 - 50 %L   POC Granulocyte 3.6  2 - 6.9   Granulocyte percent 72.0  37 - 80 %G   RBC 4.7  4.69 - 6.13 M/uL   Hemoglobin 14.5  14.1 - 18.1 g/dL   HCT, POC 40.9 (*) 81.1 - 53.7 %   MCV 88.6  80 - 97 fL   MCH, POC 30.9  27 - 31.2 pg   MCHC 34.8  31.8 - 35.4 g/dL   RDW, POC 91.4     Platelet Count, POC 152.0  142 - 424 K/uL   MPV 9.3  0 - 99.8 fL  POCT URINALYSIS DIPSTICK      Result Value Range   Color, UA yellow     Clarity, UA clear     Glucose, UA negative     Bilirubin, UA negative      Ketones, UA negative     Spec Grav, UA <=1.005     Blood, UA moderate     pH, UA 7.5     Protein, UA negative     Urobilinogen, UA negative     Nitrite, UA negative     Leukocytes, UA Negative    POCT UA - MICROSCOPIC ONLY      Result Value Range   WBC, Ur, HPF, POC rare     RBC, urine, microscopic 5-8     Bacteria, U Microscopic occ     Mucus, UA negative     Epithelial cells, urine per micros occ     Crystals, Ur, HPF, POC negative     Casts, Ur, LPF, POC negative     Yeast, UA negative         Assessment & Plan:   1. Abdominal pain   2. UTI (urinary tract infection)     Meds ordered this encounter  Medications  . ciprofloxacin (CIPRO) 500 MG tablet    Sig: Take 1 tablet (500 mg  total) by mouth 2 (two) times daily.    Dispense:  20 tablet    Refill:  0    Order Specific Question:  Supervising Provider    Answer:  Ernestina Penna [[id number]]   Force fluids Rest RTO prn  Mary-Margaret Daphine Deutscher, FNP

## 2013-01-04 NOTE — Patient Instructions (Signed)
Urinary Tract Infection  Urinary tract infections (UTIs) can develop anywhere along your urinary tract. Your urinary tract is your body's drainage system for removing wastes and extra water. Your urinary tract includes two kidneys, two ureters, a bladder, and a urethra. Your kidneys are a pair of bean-shaped organs. Each kidney is about the size of your fist. They are located below your ribs, one on each side of your spine.  CAUSES  Infections are caused by microbes, which are microscopic organisms, including fungi, viruses, and bacteria. These organisms are so small that they can only be seen through a microscope. Bacteria are the microbes that most commonly cause UTIs.  SYMPTOMS   Symptoms of UTIs may vary by age and gender of the patient and by the location of the infection. Symptoms in young women typically include a frequent and intense urge to urinate and a painful, burning feeling in the bladder or urethra during urination. Older women and men are more likely to be tired, shaky, and weak and have muscle aches and abdominal pain. A fever may mean the infection is in your kidneys. Other symptoms of a kidney infection include pain in your back or sides below the ribs, nausea, and vomiting.  DIAGNOSIS  To diagnose a UTI, your caregiver will ask you about your symptoms. Your caregiver also will ask to provide a urine sample. The urine sample will be tested for bacteria and white blood cells. White blood cells are made by your body to help fight infection.  TREATMENT   Typically, UTIs can be treated with medication. Because most UTIs are caused by a bacterial infection, they usually can be treated with the use of antibiotics. The choice of antibiotic and length of treatment depend on your symptoms and the type of bacteria causing your infection.  HOME CARE INSTRUCTIONS   If you were prescribed antibiotics, take them exactly as your caregiver instructs you. Finish the medication even if you feel better after you  have only taken some of the medication.   Drink enough water and fluids to keep your urine clear or pale yellow.   Avoid caffeine, tea, and carbonated beverages. They tend to irritate your bladder.   Empty your bladder often. Avoid holding urine for long periods of time.   Empty your bladder before and after sexual intercourse.   After a bowel movement, women should cleanse from front to back. Use each tissue only once.  SEEK MEDICAL CARE IF:    You have back pain.   You develop a fever.   Your symptoms do not begin to resolve within 3 days.  SEEK IMMEDIATE MEDICAL CARE IF:    You have severe back pain or lower abdominal pain.   You develop chills.   You have nausea or vomiting.   You have continued burning or discomfort with urination.  MAKE SURE YOU:    Understand these instructions.   Will watch your condition.   Will get help right away if you are not doing well or get worse.  Document Released: 03/20/2005 Document Revised: 12/10/2011 Document Reviewed: 07/19/2011  [employer] Patient Information 2014 [employer].

## 2013-01-14 ENCOUNTER — Encounter: Payer: Self-pay | Admitting: Internal Medicine

## 2013-01-14 ENCOUNTER — Ambulatory Visit (INDEPENDENT_AMBULATORY_CARE_PROVIDER_SITE_OTHER): Payer: BC Managed Care – PPO | Admitting: Internal Medicine

## 2013-01-14 VITALS — BP 110/80 | HR 73 | Temp 98.1°F | Ht 68.0 in | Wt 177.6 lb

## 2013-01-14 DIAGNOSIS — R0609 Other forms of dyspnea: Secondary | ICD-10-CM

## 2013-01-14 DIAGNOSIS — R0602 Shortness of breath: Secondary | ICD-10-CM

## 2013-01-14 DIAGNOSIS — R0600 Dyspnea, unspecified: Secondary | ICD-10-CM

## 2013-01-14 DIAGNOSIS — R0989 Other specified symptoms and signs involving the circulatory and respiratory systems: Secondary | ICD-10-CM

## 2013-01-14 NOTE — Patient Instructions (Addendum)
Pleae have cardiopulmonary stress tst Return to see me after that

## 2013-01-14 NOTE — Progress Notes (Signed)
Subjective:    Patient ID: Richard Franco, male    DOB: 10-20-1965, 47 y.o.   MRN: [id number]  HPI 47 year old male with PMH of pericarditis one year ago of unknown reason who persistently had CP since. Presented to the cardiologist with atypical chest pain, somewhat pleuritic in nature.  Cath was performed that was normal. CTA of the chest was done that revealed 3 <1 cm pulmonary nodules and multiple areas of GGO.  Patient has been more SOB over the past 3 months. On further enquires he states that he noticed shortness of breath over the past several years, at rest and with exertion, associated with chest tightnes. Extensive cardiology evaluation did not reveal a cardiac etiology. In average reports daily symptoms,  2-3 times a day, waking up approximately 2 nights per month with dyspnea.  During hospitalization he was initiated on steroids. He endorsed improved symptoms with steroids. Denies cough or wheezing, but does states that sometimes when he is short of breath he hears a noise coming from his throat.   04/02/2012 Steroids tapered off. He did not not report a significant difference off steroids.   04/16/2012 Was seen by Dr. Sherene Sires in clinic; CT chest showed persistent lung nodules and ground glass opacities in a different territory distribution. Steroids restarted at 20 mg daily; the patient was instructed to taper them to 10 mg.   04/30/12 The patient on 10 mg of prednisone daily;   05/06/12 Bronchoscopy with BAL, cell count and TBBx of the lingula  BAL cell count with elevated macrophages (possible COP, Hypersensitivity pneumonitis vs chronic microaspiration, possible Aluminum exposure)  RVP neg Viral culture neg  AFB smear neg, culture negative   Cytology/pathology benign with no evidence of aspiration or ILD    05/15/12  Remains on 10 mg steroids;  -methacholine challenge neg -barium swallow WNL  07/02/11 present visit -CT chest with resolution of GGO  - persistent nodules   -steroids decreased to 5 mg   09/16/12  -CT with no GGO, lung nodules stable  -advised to stop steroids  Complains of shortness of breath at rest and with exertion; chest tightness but no wheezing; Denies cough, sputum production, hemoptysis, fever, chills, rash, PND, orthopnea, LE edema, calf tenderness or LE edema. Has had muscle pain after initiating statins; in the interim resolved.   Since the previous visit he presented to the ED and was found to have elevated troponins heart catheterization negative for CAD. He continues to have chest tightness, initiated cardizem and now reports feeling better.   Acute OV 11/02/12 --  Hx of dyspnea, chest discomfort felt to be coronary vasospasm, GGI on CT chest that resolved as of 08/2012 CT scan. Presents today c/o about 3 weeks of tachycardia w exertion, some cough for ~ a week, usually non-productive, chest tightness. Saw some dark specks in his mucous several times w cough 10/22/12, no over blood, none since then. He is having nasal congestion and allergy sx. No sick contacts. He stopped prednisone in mid March. Of note he has had progressive joint pain in his fingers and hands since stopping the prednisone.   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 No other autoimmune manifestations and no changes in environment. No drug use.   Occupation: toxic exposure to aluminum, steel, plastic dust Travels:no travels abroad TB exposure:denies Pets: none  REC Please start loratadine 10mg  daily  Please start fluticasone nasal spray, 2 sprays each side twice  a day  CXR today  Follow with Dr Marchelle Gearing in 4 weeks to assess your symptoms. You may need to discuss repeating your CT scan of the chest at that time.    OV 01/14/2013   He has multiple issues which include arthritis, fibromyalgia, depression, GERD, anxiety, migraines, and tobacco abuse (quit smoking 9 months ago). He has had a  recent NSTEMI with cath showing normal coronary arteries, EF of 55 to 65%. This was felt to be due to vasospasm. D dimer was positive but CT was negative for PE. He has had a chronically abnormal chest CT. March 2014: shown stable scattered bilateral pulmonary nodules most c/w underlying infectious/inflammatory or granulomatous proces 4mm - 6mm; plan to repeat in 6-12 months which would be Oct 2014/March 2015   Now following with pumonray. Transition of care from Dr Frederico Hamman to Dr Marchelle Gearing. Since prior pulmonary visit in the interim Has see rheum Dr Dierdre Forth recently an dplaced on 10 day prednisone. Is having autoimmune panel repeated by rheumatology This is for diffuse joint pain and some myalgia.  In terms of pulmonary symptoms: Still has chronic early morning chest tightness and not relieved by albuterol. Refrains from NTG use .,. Adiditonally still with moderate dyspnea on exertion, esp for hills at golf. Relieved by rest. Unknown if releived by NTG; refrains. Associated chest ttightness + No wheeze or cough or syncope. STable. Insidious onset of months to years. No PND or orthopnea    [employer]  reports that he quit smoking about 9 months ago. His smoking use included Cigarettes. He has a 27 pack-year smoking history. He has quit using smokeless tobacco. His smokeless tobacco use included Chew.   Labs  normal spirometry oct and  nov 2013 Oct 2013: autoimmne ANA, ACE, ANCA, RF, - negative March 2014 CT chest: clearing GGI but small nodules 4mm x 2  Review of Systems  Constitutional: Negative for fever and unexpected weight change.  HENT: Negative for ear pain, nosebleeds, congestion, sore throat, rhinorrhea, sneezing, trouble swallowing, dental problem, postnasal drip and sinus pressure.   Eyes: Negative for redness and itching.  Respiratory: Positive for cough and shortness of breath. Negative for chest tightness and wheezing.   Cardiovascular: Positive for chest pain. Negative for  palpitations and leg swelling.  Gastrointestinal: Negative for nausea and vomiting.  Genitourinary: Negative for dysuria.  Musculoskeletal: Negative for joint swelling.  Skin: Negative for rash.  Neurological: Negative for headaches.  Hematological: Does not bruise/bleed easily.  Psychiatric/Behavioral: Negative for dysphoric mood. The patient is not nervous/anxious.    Current outpatient prescriptions:alfuzosin (UROXATRAL) 10 MG 24 hr tablet, Take 10 mg by mouth daily., Disp: , Rfl: ;  aspirin EC 81 MG EC tablet, Take 1 tablet (81 mg total) by mouth daily., Disp: , Rfl: ;  busPIRone (BUSPAR) 10 MG tablet, Take 10 mg by mouth 2 (two) times daily., Disp: , Rfl: ;  ciprofloxacin (CIPRO) 500 MG tablet, Take 1 tablet (500 mg total) by mouth 2 (two) times daily., Disp: 20 tablet, Rfl: 0 Coenzyme Q10 200 MG capsule, Take 1 capsule (200 mg total) by mouth daily., Disp: , Rfl: ;  diltiazem (CARDIZEM CD) 120 MG 24 hr capsule, 1 daily with a 240mg  capsule for a total of 360mg , Disp: 30 capsule, Rfl: 3;  diltiazem (CARDIZEM CD) 240 MG 24 hr capsule, Take 1 capsule (240 mg total) by mouth daily., Disp: 90 capsule, Rfl: 3;  FLUoxetine (PROZAC) 20 MG capsule, Take 60 mg by mouth daily., Disp: , Rfl:  lamoTRIgine (LAMICTAL) 100 MG tablet, Take 100 mg by mouth 2 (two) times daily., Disp: , Rfl: ;  lansoprazole (PREVACID) 30 MG capsule, Take 30 mg by mouth 2 (two) times daily at 10 AM and 5 PM., Disp: , Rfl: ;  loratadine (CLARITIN) 10 MG tablet, Take 1 tablet (10 mg total) by mouth daily., Disp: 30 tablet, Rfl: 6 nitroGLYCERIN (NITROSTAT) 0.4 MG SL tablet, Place 1 tablet (0.4 mg total) under the tongue every 5 (five) minutes as needed for chest pain (up to 3 doses)., Disp: 25 tablet, Rfl: 4;  oxyCODONE-acetaminophen (PERCOCET) 5-325 MG per tablet, Take 1-2 tablets by mouth every 4 (four) hours as needed. As needed for pain., Disp: , Rfl: ;  predniSONE (STERAPRED UNI-PAK) 10 MG tablet, Take 10 mg by mouth daily. 12 day  pack. Started on 01-13-13, Disp: , Rfl:  albuterol (PROVENTIL HFA;VENTOLIN HFA) 108 (90 BASE) MCG/ACT inhaler, Inhale 2 puffs into the lungs every 6 (six) hours as needed for wheezing., Disp: 1 Inhaler, Rfl: 6;  fluticasone (FLONASE) 50 MCG/ACT nasal spray, Place 2 sprays into the nose 2 (two) times daily., Disp: 16 g, Rfl: 6     Objective:   Physical Exam  Nursing note and vitals reviewed. Constitutional: He is oriented to person, place, and time. He appears well-developed and well-nourished. No distress.  HENT:  Head: Normocephalic and atraumatic.  Right Ear: External ear normal.  Left Ear: External ear normal.  Mouth/Throat: Oropharynx is clear and moist. No oropharyngeal exudate.  Eyes: Conjunctivae and EOM are normal. Pupils are equal, round, and reactive to light. Right eye exhibits no discharge. Left eye exhibits no discharge. No scleral icterus.  Neck: Normal range of motion. Neck supple. No JVD present. No tracheal deviation present. No thyromegaly present.  Cardiovascular: Normal rate, regular rhythm and intact distal pulses.  Exam reveals no gallop and no friction rub.   No murmur heard. Pulmonary/Chest: Effort normal and breath sounds normal. No respiratory distress. He has no wheezes. He has no rales. He exhibits no tenderness.  Abdominal: Soft. Bowel sounds are normal. He exhibits no distension and no mass. There is no tenderness. There is no rebound and no guarding.  Musculoskeletal: Normal range of motion. He exhibits no edema and no tenderness.  Lymphadenopathy:    He has no cervical adenopathy.  Neurological: He is alert and oriented to person, place, and time. He has normal reflexes. No cranial nerve deficit. Coordination normal.  Skin: Skin is warm and dry. No rash noted. He is not diaphoretic. No erythema. No pallor.  Psychiatric: He has a normal mood and affect. His behavior is normal. Judgment and thought content normal.    Filed Vitals:   01/14/13 1337  BP: 110/80   Pulse: 73  Temp: 98.1 F (36.7 C)  TempSrc: Oral  Height: 5\' 8"  (1.727 m)  Weight: 177 lb 9.6 oz (80.559 kg)  SpO2: 98%         Assessment & Plan:

## 2013-01-20 ENCOUNTER — Encounter (HOSPITAL_COMMUNITY): Payer: BC Managed Care – PPO

## 2013-01-21 NOTE — Assessment & Plan Note (Signed)
This largest pulmonary complaint is dyspnea. Spirometry is normal. Autoimmune evaluation is normal. Chest CT March 2014 other than nodules does not give any clear etiology for dyspnea. Body mass index is 27.01 kg/(m^2). he is only slightly overweight. Therefore I will obtain cardiopulmonary stress test

## 2013-01-27 ENCOUNTER — Encounter (HOSPITAL_COMMUNITY): Payer: BC Managed Care – PPO

## 2013-02-10 ENCOUNTER — Encounter (HOSPITAL_COMMUNITY): Payer: BC Managed Care – PPO

## 2013-02-16 ENCOUNTER — Encounter (HOSPITAL_COMMUNITY): Payer: BC Managed Care – PPO

## 2013-02-23 ENCOUNTER — Ambulatory Visit (HOSPITAL_COMMUNITY): Payer: BC Managed Care – PPO | Attending: Internal Medicine

## 2013-02-23 DIAGNOSIS — R0600 Dyspnea, unspecified: Secondary | ICD-10-CM

## 2013-02-23 DIAGNOSIS — R0602 Shortness of breath: Secondary | ICD-10-CM | POA: Insufficient documentation

## 2013-03-01 ENCOUNTER — Ambulatory Visit (INDEPENDENT_AMBULATORY_CARE_PROVIDER_SITE_OTHER): Payer: BC Managed Care – PPO | Admitting: Internal Medicine

## 2013-03-01 ENCOUNTER — Encounter: Payer: Self-pay | Admitting: Internal Medicine

## 2013-03-01 VITALS — BP 130/88 | HR 89 | Temp 98.1°F | Ht 68.0 in | Wt 181.0 lb

## 2013-03-01 DIAGNOSIS — R5381 Other malaise: Secondary | ICD-10-CM

## 2013-03-01 DIAGNOSIS — R0602 Shortness of breath: Secondary | ICD-10-CM

## 2013-03-01 DIAGNOSIS — R0600 Dyspnea, unspecified: Secondary | ICD-10-CM

## 2013-03-01 DIAGNOSIS — Z23 Encounter for immunization: Secondary | ICD-10-CM

## 2013-03-01 DIAGNOSIS — R911 Solitary pulmonary nodule: Secondary | ICD-10-CM

## 2013-03-01 DIAGNOSIS — R0609 Other forms of dyspnea: Secondary | ICD-10-CM

## 2013-03-01 NOTE — Progress Notes (Signed)
Subjective:    Patient ID: Richard Franco, male    DOB: [birth year], 46 y.o.   MRN: [id number]  HPI 47 year old male with PMH of pericarditis one year ago of unknown reason who persistently had CP since. Presented to the cardiologist with atypical chest pain, somewhat pleuritic in nature.  Cath was performed that was normal. CTA of the chest was done that revealed 3 <1 cm pulmonary nodules and multiple areas of GGO.  Patient has been more SOB over the past 3 months. On further enquires he states that he noticed shortness of breath over the past several years, at rest and with exertion, associated with chest tightnes. Extensive cardiology evaluation did not reveal a cardiac etiology. In average reports daily symptoms,  2-3 times a day, waking up approximately 2 nights per month with dyspnea.  During hospitalization he was initiated on steroids. He endorsed improved symptoms with steroids. Denies cough or wheezing, but does states that sometimes when he is short of breath he hears a noise coming from his throat.   04/02/2012 Steroids tapered off. He did not not report a significant difference off steroids.   04/16/2012 Was seen by Dr. Sherene Sires in clinic; CT chest showed persistent lung nodules and ground glass opacities in a different territory distribution. Steroids restarted at 20 mg daily; the patient was instructed to taper them to 10 mg.   04/30/12 The patient on 10 mg of prednisone daily;   05/06/12 Bronchoscopy with BAL, cell count and TBBx of the lingula  BAL cell count with elevated macrophages (possible COP, Hypersensitivity pneumonitis vs chronic microaspiration, possible Aluminum exposure)  RVP neg Viral culture neg  AFB smear neg, culture negative   Cytology/pathology benign with no evidence of aspiration or ILD    05/15/12  Remains on 10 mg steroids;  -methacholine challenge neg -barium swallow WNL  07/02/11 present visit -CT chest with resolution of GGO  - persistent nodules   -steroids decreased to 5 mg   09/16/12  -CT with no GGO, lung nodules stable  -advised to stop steroids  Complains of shortness of breath at rest and with exertion; chest tightness but no wheezing; Denies cough, sputum production, hemoptysis, fever, chills, rash, PND, orthopnea, LE edema, calf tenderness or LE edema. Has had muscle pain after initiating statins; in the interim resolved.   Since the previous visit he presented to the ED and was found to have elevated troponins heart catheterization negative for CAD. He continues to have chest tightness, initiated cardizem and now reports feeling better.   Acute OV 11/02/12 --  Hx of dyspnea, chest discomfort felt to be coronary vasospasm, GGI on CT chest that resolved as of 08/2012 CT scan. Presents today c/o about 3 weeks of tachycardia w exertion, some cough for ~ a week, usually non-productive, chest tightness. Saw some dark specks in his mucous several times w cough 10/22/12, no over blood, none since then. He is having nasal congestion and allergy sx. No sick contacts. He stopped prednisone in mid March. Of note he has had progressive joint pain in his fingers and hands since stopping the prednisone.   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 No other autoimmune manifestations and no changes in environment. No drug use.   Occupation: toxic exposure to aluminum, steel, plastic dust Travels:no travels abroad TB exposure:denies Pets: none  REC Please start loratadine 10mg  daily  Please start fluticasone nasal spray, 2 sprays each side twice  a day  CXR today  Follow with Dr Marchelle Gearing in 4 weeks to assess your symptoms. You may need to discuss repeating your CT scan of the chest at that time.    OV 01/14/2013   He has multiple issues which include arthritis, fibromyalgia, depression, GERD, anxiety, migraines, and tobacco abuse (quit smoking 9 months ago). He has had a  recent NSTEMI with cath showing normal coronary arteries, EF of 55 to 65%. This was felt to be due to vasospasm. D dimer was positive but CT was negative for PE. He has had a chronically abnormal chest CT. March 2014: shown stable scattered bilateral pulmonary nodules most c/w underlying infectious/inflammatory or granulomatous proces 4mm - 6mm; plan to repeat in 6-12 months which would be Oct 2014/March 2015   Now following with pumonray. Transition of care from Dr Frederico Hamman to Dr Marchelle Gearing. Since prior pulmonary visit in the interim Has see rheum Dr Dierdre Forth recently an dplaced on 10 day prednisone. Is having autoimmune panel repeated by rheumatology This is for diffuse joint pain and some myalgia.  In terms of pulmonary symptoms: Still has chronic early morning chest tightness and not relieved by albuterol. Refrains from NTG use .,.Adiditonally still with moderate dyspnea on exertion, esp for hills at golf. Relieved by rest. Unknown if releived by NTG; refrains. Associated chest ttightness + No wheeze or cough or syncope. STable. Insidious onset of months to years. No PND or orthopnea  [employer]  reports that he quit smoking about 9 months ago. His smoking use included Cigarettes. He has a 27 pack-year smoking history. He has quit using smokeless tobacco. His smokeless tobacco use included Chew.   Labs  normal spirometry oct and  nov 2013 Oct 2013: autoimmne ANA, ACE, ANCA, RF, - negative March 2014 CT chest: clearing GGI but small nodules 4mm x 2  REC Pleae have cardiopulmonary stress tst  Return to see me after that    OV 03/01/2013 Follow up dyspnea and fatigue. He had cardiopulmonary stress test on 02/23/2013 and the results of the lobe and suggest physical deconditioning as a cause for dyspnea and fatigue. He surprised that the result because he says that he does activities of daily living and is doing yard work and also walk in [employer] and other work in [employer]. However, he states that  symptoms have been persisting since 2006/2008 and he had a viral illness following exposure to his son with  respiratory viru and who in turn was exposed to a kid with measles. He states that all his dyspnea and fatigue his since then even though he recovered from the acute illness. He recollects being infectious diseases at [place] and recollects changing diagnoses in 2008. He starts are not available to me but I do note that so the results are mentioned in July 2008. He was finally told that he had parvovirus infection.    also he states that he is known to have low testosterone and prior thyroid issues but does not have endocrinologist on record  CPST 02/23/2013  Procedure: This patient underwent staged symptom-limited exercise testing using a individualized bike protocol with expired gas analysis metabolic evaluation during exercise.  Demographics  Age: 56 Ht. (in.) 68 Wt. (lb) 178 BMI: 27.1 Predicted Peak VO2: 33.6 ml/kg/min  Gender: Male Ht (cm) 172.7 Wt. (kg) 80.7    Results  Pre-Exercise PFTs   FVC 4.87 (101%)   FEV1 3.93 (103%)   FEV1/FVC 81   MVV 162 (107%)  Post-Exercise PFTs (  from lowest post-exercise trial (%change from rest))  FVC 4.87 (0%)   FEV1 3.95 (0%)   FEV1/FVC 80%    Exercise Time: 9:24 Watts: 180  RPE: 20  Reason stopped: Patient stopped due to leg fatigue and dyspnea (7/10)  Additional symptoms: Chest discomfort (upper left chest) 4/10  Resting HR: 72 Peak HR: 151 (87% age predicted max HR)  BP rest: 113/78 BP peak: 190/64  Peak VO2: 25.6 (76.2% predicted peak VO2)  VE/VCO2 slope: 31.2  OUES: 1.98  Peak RER: 1.28  Ventilatory Threshold: 11.3 (33.6% predicted peak VO2)  Peak RR 30  Peak Ventilation: 82.8  VE/MVV: 51.2%  PETCO2 at peak: 37  O2pulse: 14 (88% predicted O2pulse)   Interpretation  Notes: Patient gave a very good effort. The pulse-oximetry remained at 96% or greater throughout the exercise. Exercise  was performed on a cycle ergometer starting at [facility] and increasing by 20 W/min.  ECG: Resting ECG in normal sinus rhythm. There was an adequate HR response to the exercise with no ST-T changes or arrhythmias, however patient did not reach age predicted maximal HR. The BP response was appropriate.  PFT: Resting spirometry within normal limits. The MVV was normal. Post-exercise spirometry was performed IPE, 5, 10, 15 and 20 (reported above) minutes of recovery with no significant changes from rest to indicate respiratory muscle fatigue or EIB.  CPX: The RER of 1.28 indicates a maximal effort. The peak VO2 is mildly reduced at 25.6 ml/kg/min (76% of the age/gender/weight matched sedentary norm). The VO2 a the ventilatory threshold was well below normal at 33.6% of the predicted peak VO2. At peak exercise the ventilation was 51% of the measured MVV indicating ventilatory reserve remained (respiratory rate was below the expected range, Vt/IC [90%] was at its limit, PETCO2 was high normal). There was an overall inappropriate HR response to the exercise with a HR reserve of 23 bpm. The O2pulse (a surrogate for stroke volume) increased throughout the exercise reaching 14 ml/beat (88% of predicted). The VE/VCO2 slope was mildly elevated suggesting some reduced ventilatory efficiency. The oxygen uptake efficiency slope (OUES) was mildly reduced and reflects the patient's measured functional capacity.  Conclusion: Exercise testing with gas exchange demonstrates a mild functional limitation when compared to matched sedentary norms. The patient's tidal volume during exercise appears to be a limiting factor, couple with the low respiratory rate. The elevated tidal volume during exercise can induce a sensation of not being able to get a full breath and may cause chest discomfort. There also appears to be significant deconditioning. ^^^Preliminary CPX Results, Finalized results will be  forwarded when completed by interpreting physician.^^^  Prepared by: Richard Franco, MEd, ACSM-RCEP Sr. Exercise Physiologist 02/23/2013 2:52 PM   Review of Systems  Constitutional: Negative for fever and unexpected weight change.  HENT: Negative for ear pain, nosebleeds, congestion, sore throat, rhinorrhea, sneezing, trouble swallowing, dental problem, postnasal drip and sinus pressure.   Eyes: Negative for redness and itching.  Respiratory: Negative for cough, chest tightness, shortness of breath and wheezing.   Cardiovascular: Negative for palpitations and leg swelling.  Gastrointestinal: Negative for nausea and vomiting.  Genitourinary: Negative for dysuria.  Musculoskeletal: Negative for joint swelling.  Skin: Negative for rash.  Neurological: Negative for headaches.  Hematological: Does not bruise/bleed easily.  Psychiatric/Behavioral: Negative for dysphoric mood. The patient is not nervous/anxious.        Objective:   Physical Exam Nursing note and vitals reviewed. Constitutional: He is oriented to person, place, and time. He  appears well-developed and well-nourished. No distress.  HENT:  Head: Normocephalic and atraumatic.  Right Ear: External ear normal.  Left Ear: External ear normal.  Mouth/Throat: Oropharynx is clear and moist. No oropharyngeal exudate.  Eyes: Conjunctivae and EOM are normal. Pupils are equal, round, and reactive to light. Right eye exhibits no discharge. Left eye exhibits no discharge. No scleral icterus.  Neck: Normal range of motion. Neck supple. No JVD present. No tracheal deviation present. No thyromegaly present.  Cardiovascular: Normal rate, regular rhythm and intact distal pulses.  Exam reveals no gallop and no friction rub.   No murmur heard. Pulmonary/Chest: Effort normal and breath sounds normal. No respiratory distress. He has no wheezes. He has no rales. He exhibits no tenderness.  Abdominal: Soft. Bowel sounds are normal. He exhibits no  distension and no mass. There is no tenderness. There is no rebound and no guarding.  Musculoskeletal: Normal range of motion. He exhibits no edema and no tenderness.  Lymphadenopathy:    He has no cervical adenopathy.  Neurological: He is alert and oriented to person, place, and time. He has normal reflexes. No cranial nerve deficit. Coordination normal.  Skin: Skin is warm and dry. No rash noted. He is not diaphoretic. No erythema. No pallor.  Psychiatric: He has a normal mood and affect. His behavior is normal. Judgment and thought content normal.   Filed Vitals:   03/01/13 1602  BP: 130/88  Pulse: 89  Temp: 98.1 F (36.7 C)  TempSrc: Oral  Height: 5\' 8"  (1.727 m)  Weight: 181 lb (82.101 kg)  SpO2: 97%         Assessment & Plan:

## 2013-03-01 NOTE — Patient Instructions (Addendum)
Your breathing test suggests physical deconditioning whic could be due to the unexplained viral illness you had several years ago [facility] REfer Dr Talmage Nap for endocrine causes of fatgue  REturn early Dec 2014 wih CT chest

## 2013-03-02 NOTE — Assessment & Plan Note (Signed)
  REturn early Dec 2014 wih CT chest

## 2013-03-02 NOTE — Assessment & Plan Note (Signed)
Your breathing test suggests physical deconditioning whic could be due to the unexplained viral illness you had several years ago AT[facility]end pulmonary rehab at [facility] REfer Dr Talmage Nap for endocrine causes of fatgue  REturn early Dec 2014 wih CT chest for nodule   > 50% of this > 25 min visit spent in face to face counseling (15 min visit converted to 25 min)

## 2013-03-02 NOTE — Assessment & Plan Note (Signed)
REfer Dr Talmage Nap for endocrine causes of fatgue

## 2013-03-12 ENCOUNTER — Telehealth: Payer: Self-pay | Admitting: Nurse Practitioner

## 2013-03-12 NOTE — Telephone Encounter (Signed)
Spoke with patient.  Thinks he may need to see a dentist first. Has appt with them this morning. If they can't help him he will call back for triage to schedule appt for today

## 2013-03-23 ENCOUNTER — Telehealth: Payer: Self-pay | Admitting: Cardiology

## 2013-03-23 MED ORDER — DILTIAZEM HCL ER COATED BEADS 120 MG PO CP24: ORAL_CAPSULE | ORAL | Status: DC

## 2013-03-23 NOTE — Telephone Encounter (Signed)
refill 

## 2013-03-25 ENCOUNTER — Telehealth: Payer: Self-pay | Admitting: *Deleted

## 2013-03-25 MED ORDER — DILTIAZEM HCL ER COATED BEADS 120 MG PO CP24: ORAL_CAPSULE | ORAL | Status: DC

## 2013-03-25 NOTE — Telephone Encounter (Signed)
That would be ok.

## 2013-03-25 NOTE — Telephone Encounter (Signed)
Pt states he had been feeling tired and fatigued. He decreased his cardizem to 120mg  bid instead of 240mg  in AM and 120mg  in PM. He is overall feeling better but maybe a little more chest pain. He is asking if OK to continue 120mg  bid. I will forward to Dr Shirlee Latch for review.

## 2013-03-25 NOTE — Telephone Encounter (Signed)
Pt advised.

## 2013-03-25 NOTE — Addendum Note (Signed)
Addended by: Jacqlyn Krauss on: 03/25/2013 05:08 PM   Modules accepted: Orders, Medications

## 2013-03-31 ENCOUNTER — Telehealth: Payer: Self-pay | Admitting: Nurse Practitioner

## 2013-03-31 NOTE — Telephone Encounter (Signed)
Pt having some nasal irritation appt scheduled

## 2013-04-01 ENCOUNTER — Ambulatory Visit: Payer: BC Managed Care – PPO | Admitting: Nurse Practitioner

## 2013-04-02 ENCOUNTER — Ambulatory Visit: Payer: BC Managed Care – PPO | Admitting: Family Medicine

## 2013-04-30 ENCOUNTER — Encounter (HOSPITAL_COMMUNITY): Payer: Self-pay | Admitting: Emergency Medicine

## 2013-04-30 ENCOUNTER — Emergency Department (HOSPITAL_COMMUNITY): Payer: BC Managed Care – PPO

## 2013-04-30 ENCOUNTER — Emergency Department (HOSPITAL_COMMUNITY)
Admission: EM | Admit: 2013-04-30 | Discharge: 2013-04-30 | Disposition: A | Discharge status: Home / Self Care | Payer: BC Managed Care – PPO | Attending: Emergency Medicine | Admitting: Emergency Medicine

## 2013-04-30 DIAGNOSIS — I252 Old myocardial infarction: Secondary | ICD-10-CM | POA: Insufficient documentation

## 2013-04-30 DIAGNOSIS — M129 Arthropathy, unspecified: Secondary | ICD-10-CM | POA: Insufficient documentation

## 2013-04-30 DIAGNOSIS — K219 Gastro-esophageal reflux disease without esophagitis: Secondary | ICD-10-CM | POA: Insufficient documentation

## 2013-04-30 DIAGNOSIS — Z9861 Coronary angioplasty status: Secondary | ICD-10-CM | POA: Insufficient documentation

## 2013-04-30 DIAGNOSIS — F3289 Other specified depressive episodes: Secondary | ICD-10-CM | POA: Insufficient documentation

## 2013-04-30 DIAGNOSIS — G43909 Migraine, unspecified, not intractable, without status migrainosus: Secondary | ICD-10-CM | POA: Insufficient documentation

## 2013-04-30 DIAGNOSIS — R079 Chest pain, unspecified: Secondary | ICD-10-CM

## 2013-04-30 DIAGNOSIS — Z8639 Personal history of other endocrine, nutritional and metabolic disease: Secondary | ICD-10-CM | POA: Insufficient documentation

## 2013-04-30 DIAGNOSIS — Z7982 Long term (current) use of aspirin: Secondary | ICD-10-CM | POA: Insufficient documentation

## 2013-04-30 DIAGNOSIS — Z862 Personal history of diseases of the blood and blood-forming organs and certain disorders involving the immune mechanism: Secondary | ICD-10-CM | POA: Insufficient documentation

## 2013-04-30 DIAGNOSIS — Z79899 Other long term (current) drug therapy: Secondary | ICD-10-CM | POA: Insufficient documentation

## 2013-04-30 DIAGNOSIS — Z87891 Personal history of nicotine dependence: Secondary | ICD-10-CM | POA: Insufficient documentation

## 2013-04-30 DIAGNOSIS — IMO0001 Reserved for inherently not codable concepts without codable children: Secondary | ICD-10-CM | POA: Insufficient documentation

## 2013-04-30 DIAGNOSIS — F411 Generalized anxiety disorder: Secondary | ICD-10-CM | POA: Insufficient documentation

## 2013-04-30 DIAGNOSIS — F329 Major depressive disorder, single episode, unspecified: Secondary | ICD-10-CM | POA: Insufficient documentation

## 2013-04-30 LAB — COMPREHENSIVE METABOLIC PANEL
ALT: 16 U/L (ref 0–53)
AST: 19 U/L (ref 0–37)
Albumin: 4.2 g/dL (ref 3.5–5.2)
Alkaline Phosphatase: 70 U/L (ref 39–117)
BUN: 11 mg/dL (ref 6–23)
CO2: 27 mEq/L (ref 19–32)
Calcium: 9.6 mg/dL (ref 8.4–10.5)
Chloride: 102 mEq/L (ref 96–112)
Creatinine, Ser: 1.2 mg/dL (ref 0.50–1.35)
GFR calc Af Amer: 82 mL/min — ABNORMAL LOW (ref >90)
GFR calc non Af Amer: 71 mL/min — ABNORMAL LOW (ref >90)
Glucose, Bld: 103 mg/dL — ABNORMAL HIGH (ref 70–99)
Potassium: 3.8 mEq/L (ref 3.5–5.1)
Sodium: 139 mEq/L (ref 135–145)
Total Bilirubin: 0.3 mg/dL (ref 0.3–1.2)
Total Protein: 7.1 g/dL (ref 6.0–8.3)

## 2013-04-30 LAB — CBC
HCT: 40.7 % (ref 39.0–52.0)
Hemoglobin: 14.5 g/dL (ref 13.0–17.0)
MCH: 30.9 pg (ref 26.0–34.0)
MCHC: 35.6 g/dL (ref 30.0–36.0)
MCV: 86.8 fL (ref 78.0–100.0)
Platelets: 188 10*3/uL (ref 150–400)
RBC: 4.69 MIL/uL (ref 4.22–5.81)
RDW: 12.4 % (ref 11.5–15.5)
WBC: 4.7 10*3/uL (ref 4.0–10.5)

## 2013-04-30 LAB — POCT I-STAT TROPONIN I: Troponin i, poc: 0 ng/mL (ref 0.00–0.08)

## 2013-04-30 LAB — TROPONIN I: Troponin I: <0.3 ng/mL (ref <0.30)

## 2013-04-30 MED ORDER — OXYCODONE-ACETAMINOPHEN 5-325 MG PO TABS
1.0000 | ORAL_TABLET | Freq: Once | ORAL | Status: AC
  Administered 2013-04-30: 1 via ORAL
  Filled 2013-04-30: qty 1

## 2013-04-30 MED ORDER — ONDANSETRON 4 MG PO TBDP
8.0000 mg | ORAL_TABLET | Freq: Once | ORAL | Status: AC
  Administered 2013-04-30: 8 mg via ORAL
  Filled 2013-04-30: qty 2

## 2013-04-30 NOTE — ED Notes (Signed)
Cp that started on rt shoulder and and moved across chest and  Goes down rt arm to elbow and rads to jaw  Has hx of MI feb of 14 has had 2 nitro took 1 baby this am

## 2013-05-04 NOTE — ED Provider Notes (Signed)
CSN: [id number]     Arrival date & time 04/30/13  1520 History   First MD Initiated Contact with Patient 04/30/13 1708     Chief Complaint  Patient presents with  . Chest Pain   (Consider location/radiation/quality/duration/timing/severity/associated sxs/prior Treatment) HPI.....Marland Kitchen sharp right-sided chest pain with radiation to right upper extremity without associated nausea, dyspnea, diaphoresis since this morning.  Pain has subsided. He took 2 nitroglycerin this morning. History of MI in February 2014.  Severity is mild to moderate.  Past Medical History  Diagnosis Date  . Migraine headache     pt takes inderal for headaches  . Arthritis     s/p neck surgery  . Fibromyalgia   . Depression   . GERD (gastroesophageal reflux disease)   . Testosterone deficiency   . Anxiety   . Complication of anesthesia     " difficult to urinate after "  . Abnormal chest CT 03/2012    a. Scattered patchy ground glass opacities and pulmonary nodules. b. F/u CT angio 07/2012: scattered stable bilat pulm nodules.  . NSTEMI (non-ST elevated myocardial infarction)     a. Normal cath 03/2012. b. 07/2012: troponin 4, normal cors, ?vasospasm.  . Tobacco abuse    Past Surgical History  Procedure Laterality Date  . Posterior cervical fusion/foraminotomy  05/09/2011    Procedure: POSTERIOR CERVICAL FUSION/FORAMINOTOMY LEVEL 1;  Surgeon: Hewitt Shorts;  Location: [facility];  Service: Neurosurgery;  Laterality: N/A;  C4/5 posterior arthrodesis with instrumentation   . Cystectomy    . Cardiac catheterization  03/25/2012  . Ankle fusion    . Video bronchoscopy  05/06/2012    Procedure: VIDEO BRONCHOSCOPY WITH FLUORO;  Surgeon: Roxine Caddy, MD;  Location: [facility] ENDOSCOPY;  Service: Cardiopulmonary;  Laterality: Bilateral;  . Fracture surgery  2006    left ankle   Family History  Problem Relation Age of Onset  . Diabetes Mother   . Coronary artery disease Paternal Uncle   . Asthma Sister    History   Substance Use Topics  . Smoking status: Former Smoker -- 1.00 packs/day for 27 years    Types: Cigarettes    Quit date: 03/24/2012  . Smokeless tobacco: Former Neurosurgeon    Types: Chew  . Alcohol Use: No    Review of Systems  All other systems reviewed and are negative.    Allergies  Midodrine hcl and Sulfonamide derivatives  Home Medications   Current Outpatient Rx  Name  Route  Sig  Dispense  Refill  . albuterol (PROVENTIL HFA;VENTOLIN HFA) 108 (90 BASE) MCG/ACT inhaler   Inhalation   Inhale 2 puffs into the lungs daily as needed for wheezing or shortness of breath.         . alfuzosin (UROXATRAL) 10 MG 24 hr tablet   Oral   Take 10 mg by mouth at bedtime.          Marland Kitchen aspirin EC 81 MG EC tablet   Oral   Take 1 tablet (81 mg total) by mouth daily.         . busPIRone (BUSPAR) 10 MG tablet   Oral   Take 10 mg by mouth 2 (two) times daily.         Marland Kitchen diltiazem (CARDIZEM CD) 120 MG 24 hr capsule   Oral   Take 120 mg by mouth 2 (two) times daily.         Marland Kitchen FLUoxetine (PROZAC) 20 MG capsule   Oral   Take  60 mg by mouth daily.         Marland Kitchen ibuprofen (ADVIL,MOTRIN) 200 MG tablet   Oral   Take 800 mg by mouth 2 (two) times daily as needed for mild pain or moderate pain.         Marland Kitchen lamoTRIgine (LAMICTAL) 100 MG tablet   Oral   Take 100 mg by mouth 2 (two) times daily.         . lansoprazole (PREVACID) 30 MG capsule   Oral   Take 30 mg by mouth 2 (two) times daily.          . nitroGLYCERIN (NITROSTAT) 0.4 MG SL tablet   Sublingual   Place 1 tablet (0.4 mg total) under the tongue every 5 (five) minutes as needed for chest pain (up to 3 doses).   25 tablet   4   . oxyCODONE-acetaminophen (PERCOCET) 5-325 MG per tablet   Oral   Take 0.5-2 tablets by mouth 2 (two) times daily as needed for moderate pain or severe pain. As needed for pain.         . Testosterone (ANDROGEL PUMP) 20.25 MG/ACT (1.62%) GEL   Transdermal   Place 1 application onto the skin  2 (two) times daily. 2 pumps daily          BP 117/71  Pulse 62  Temp(Src) 98.7 F (37.1 C) (Oral)  Resp 16  SpO2 95% Physical Exam  Nursing note and vitals reviewed. Constitutional: He is oriented to person, place, and time. He appears well-developed and well-nourished.  HENT:  Head: Normocephalic and atraumatic.  Eyes: Conjunctivae and EOM are normal. Pupils are equal, round, and reactive to light.  Neck: Normal range of motion. Neck supple.  Cardiovascular: Normal rate, regular rhythm and normal heart sounds.   Pulmonary/Chest: Effort normal and breath sounds normal.  Abdominal: Soft. Bowel sounds are normal.  Musculoskeletal: Normal range of motion.  Neurological: He is alert and oriented to person, place, and time.  Skin: Skin is warm and dry.  Psychiatric: He has a normal mood and affect.    ED Course  Procedures (including critical care time) Labs Review Labs Reviewed  COMPREHENSIVE METABOLIC PANEL - Abnormal; Notable for the following:    Glucose, Bld 103 (*)    GFR calc non Af Amer 71 (*)    GFR calc Af Amer 82 (*)    All other components within normal limits  CBC  TROPONIN I  POCT I-STAT TROPONIN I   Imaging Review No results found.  EKG Interpretation     Ventricular Rate:  101 PR Interval:  148 QRS Duration: 88 QT Interval:  334 QTC Calculation: 433 R Axis:   80 Text Interpretation:  Sinus tachycardia Possible Lateral infarct , age undetermined Abnormal ECG ED PHYSICIAN INTERPRETATION AVAILABLE IN [facility]            MDM   1. Chest pain    Patient is hemodynamically stable. EKG and troponin negative. He has cardiology followup    Donnetta Hutching, MD 05/04/13 1941

## 2013-06-02 ENCOUNTER — Inpatient Hospital Stay: Admission: RE | Admit: 2013-06-02 | Payer: BC Managed Care – PPO | Source: Ambulatory Visit

## 2013-06-04 ENCOUNTER — Ambulatory Visit (INDEPENDENT_AMBULATORY_CARE_PROVIDER_SITE_OTHER)
Admission: RE | Admit: 2013-06-04 | Discharge: 2013-06-04 | Disposition: A | Discharge status: Home / Self Care | Payer: BC Managed Care – PPO | Source: Ambulatory Visit | Attending: Internal Medicine | Admitting: Internal Medicine

## 2013-06-04 DIAGNOSIS — R0609 Other forms of dyspnea: Secondary | ICD-10-CM

## 2013-06-04 DIAGNOSIS — R911 Solitary pulmonary nodule: Secondary | ICD-10-CM

## 2013-06-04 DIAGNOSIS — R5381 Other malaise: Secondary | ICD-10-CM

## 2013-06-14 ENCOUNTER — Ambulatory Visit (INDEPENDENT_AMBULATORY_CARE_PROVIDER_SITE_OTHER): Payer: BC Managed Care – PPO | Admitting: Internal Medicine

## 2013-06-14 ENCOUNTER — Encounter: Payer: Self-pay | Admitting: Internal Medicine

## 2013-06-14 VITALS — BP 100/70 | HR 66 | Ht 68.0 in | Wt 181.8 lb

## 2013-06-14 DIAGNOSIS — R5381 Other malaise: Secondary | ICD-10-CM

## 2013-06-14 DIAGNOSIS — R5383 Other fatigue: Secondary | ICD-10-CM

## 2013-06-14 DIAGNOSIS — R918 Other nonspecific abnormal finding of lung field: Secondary | ICD-10-CM

## 2013-06-14 DIAGNOSIS — R911 Solitary pulmonary nodule: Secondary | ICD-10-CM

## 2013-06-14 NOTE — Patient Instructions (Addendum)
#  Lung nodule  - return in 12-18 months with CT chest wo contrast  #SHortness of breath and fatigue  - continue exercises  - continue to follow Dr Talmage Nap advice  #FOllowup  - 12- 18 months iwht CT chest

## 2013-06-14 NOTE — Progress Notes (Signed)
Subjective:    Patient ID: Richard Franco, male    DOB: 1966-03-08, 47 y.o.   MRN: [id number]  HPI  HPI 47 year old male with PMH of pericarditis one year ago of unknown reason who persistently had CP since. Presented to the cardiologist with atypical chest pain, somewhat pleuritic in nature.  Cath was performed that was normal. CTA of the chest was done that revealed 3 <1 cm pulmonary nodules and multiple areas of GGO.  Patient has been more SOB over the past 3 months. On further enquires he states that he noticed shortness of breath over the past several years, at rest and with exertion, associated with chest tightnes. Extensive cardiology evaluation did not reveal a cardiac etiology. In average reports daily symptoms,  2-3 times a day, waking up approximately 2 nights per month with dyspnea.  During hospitalization he was initiated on steroids. He endorsed improved symptoms with steroids. Denies cough or wheezing, but does states that sometimes when he is short of breath he hears a noise coming from his throat.   04/02/2012 Steroids tapered off. He did not not report a significant difference off steroids.   04/16/2012 Was seen by Dr. Sherene Sires in clinic; CT chest showed persistent lung nodules and ground glass opacities in a different territory distribution. Steroids restarted at 20 mg daily; the patient was instructed to taper them to 10 mg.   04/30/12 The patient on 10 mg of prednisone daily;   05/06/12 Bronchoscopy with BAL, cell count and TBBx of the lingula  BAL cell count with elevated macrophages (possible COP, Hypersensitivity pneumonitis vs chronic microaspiration, possible Aluminum exposure)  RVP neg Viral culture neg  AFB smear neg, culture negative   Cytology/pathology benign with no evidence of aspiration or ILD    05/15/12  Remains on 10 mg steroids;  -methacholine challenge neg -barium swallow WNL  07/02/11 present visit -CT chest with resolution of GGO  - persistent  nodules  -steroids decreased to 5 mg   09/16/12  -CT with no GGO, lung nodules stable  -advised to stop steroids  Complains of shortness of breath at rest and with exertion; chest tightness but no wheezing; Denies cough, sputum production, hemoptysis, fever, chills, rash, PND, orthopnea, LE edema, calf tenderness or LE edema. Has had muscle pain after initiating statins; in the interim resolved.   Since the previous visit he presented to the ED and was found to have elevated troponins heart catheterization negative for CAD. He continues to have chest tightness, initiated cardizem and now reports feeling better.   Acute OV 11/02/12 --  Hx of dyspnea, chest discomfort felt to be coronary vasospasm, GGI on CT chest that resolved as of 08/2012 CT scan. Presents today c/o about 3 weeks of tachycardia w exertion, some cough for ~ a week, usually non-productive, chest tightness. Saw some dark specks in his mucous several times w cough 10/22/12, no over blood, none since then. He is having nasal congestion and allergy sx. No sick contacts. He stopped prednisone in mid March. Of note he has had progressive joint pain in his fingers and hands since stopping the prednisone.   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 No other autoimmune manifestations and no changes in environment. No drug use.   Occupation: toxic exposure to aluminum, steel, plastic dust Travels:no travels abroad TB exposure:denies Pets: none  REC Please start loratadine 10mg  daily  Please start fluticasone nasal spray, 2 sprays  each side twice a day  CXR today  Follow with Dr Chase Caller in 4 weeks to assess your symptoms. You may need to discuss repeating your CT scan of the chest at that time.    OV 01/14/2013   He has multiple issues which include arthritis, fibromyalgia, depression, GERD, anxiety, migraines, and tobacco abuse (quit smoking 9 months ago). He has  had a recent NSTEMI with cath showing normal coronary arteries, EF of 55 to 65%. This was felt to be due to vasospasm. D dimer was positive but CT was negative for PE. He has had a chronically abnormal chest CT. March 2014: shown stable scattered bilateral pulmonary nodules most c/w underlying infectious/inflammatory or granulomatous proces 49mm - 74mm; plan to repeat in 6-12 months which would be Oct 2014/March 2015   Now following with pumonray. Transition of care from Dr Conception Chancy to Dr Chase Caller. Since prior pulmonary visit in the interim Has see rheum Dr Amil Amen recently an dplaced on 10 day prednisone. Is having autoimmune panel repeated by rheumatology This is for diffuse joint pain and some myalgia.  In terms of pulmonary symptoms: Still has chronic early morning chest tightness and not relieved by albuterol. Refrains from NTG use .,.Adiditonally still with moderate dyspnea on exertion, esp for hills at golf. Relieved by rest. Unknown if releived by NTG; refrains. Associated chest ttightness + No wheeze or cough or syncope. STable. Insidious onset of months to years. No PND or orthopnea  [employer]  reports that he quit smoking about 9 months ago. His smoking use included Cigarettes. He has a 27 pack-year smoking history. He has quit using smokeless tobacco. His smokeless tobacco use included Chew.   Labs  normal spirometry oct and  nov 2013 Oct 2013: autoimmne ANA, ACE, ANCA, RF, - negative March 2014 CT chest: clearing GGI but small nodules 74mm x 2  REC Pleae have cardiopulmonary stress tst  Return to see me after that    OV 03/01/2013 Follow up dyspnea and fatigue. He had cardiopulmonary stress test on 02/23/2013 and the results of the lobe and suggest physical deconditioning as a cause for dyspnea and fatigue. He surprised that the result because he says that he does activities of daily living and is doing yard work and also walk in [employer] and other work in [employer]. However, he states  that symptoms have been persisting since 2006/2008 and he had a viral illness following exposure to his son with  respiratory viru and who in turn was exposed to a kid with measles. He states that all his dyspnea and fatigue his since then even though he recovered from the acute illness. He recollects being infectious diseases at [facility] [facility] and recollects changing diagnoses in 2008. He starts are not available to me but I do note that so the results are mentioned in July 2008. He was finally told that he had parvovirus infection.    also he states that he is known to have low testosterone and prior thyroid issues but does not have endocrinologist on record  CPST 02/23/2013  Procedure: This patient underwent staged symptom-limited exercise testing using a individualized bike protocol with expired gas analysis metabolic evaluation during exercise.  Demographics  Age: 48 Ht. (in.) 65 Wt. (lb) 178 BMI: 27.1 Predicted Peak VO2: 33.6 ml/kg/min  Gender: Male Ht (cm) 172.7 Wt. (kg) 80.7    Results  Pre-Exercise PFTs   FVC 4.87 (101%)   FEV1 3.93 (103%)   FEV1/FVC 81   MVV 162 (107%)  Post-Exercise PFTs (from lowest post-exercise trial (%change from rest))  FVC 4.87 (0%)   FEV1 3.95 (0%)   FEV1/FVC 80%    Exercise Time: 9:24 Watts: 180  RPE: 20  Reason stopped: Patient stopped due to leg fatigue and dyspnea (7/10)  Additional symptoms: Chest discomfort (upper left chest) 4/10  Resting HR: 72 Peak HR: 151 (87% age predicted max HR)  BP rest: 113/78 BP peak: 190/64  Peak VO2: 25.6 (76.2% predicted peak VO2)  VE/VCO2 slope: 31.2  OUES: 1.98  Peak RER: 1.28  Ventilatory Threshold: 11.3 (33.6% predicted peak VO2)  Peak RR 30  Peak Ventilation: 82.8  VE/MVV: 51.2%  PETCO2 at peak: 37  O2pulse: 14 (88% predicted O2pulse)   Interpretation  Notes: Patient gave a very good effort. The pulse-oximetry remained at 96% or greater throughout the exercise.  Exercise was performed on a cycle ergometer starting at [facility] and increasing by 20 W/min.  ECG: Resting ECG in normal sinus rhythm. There was an adequate HR response to the exercise with no ST-T changes or arrhythmias, however patient did not reach age predicted maximal HR. The BP response was appropriate.  PFT: Resting spirometry within normal limits. The MVV was normal. Post-exercise spirometry was performed IPE, 5, 10, 15 and 20 (reported above) minutes of recovery with no significant changes from rest to indicate respiratory muscle fatigue or EIB.  CPX: The RER of 1.28 indicates a maximal effort. The peak VO2 is mildly reduced at 25.6 ml/kg/min (76% of the age/gender/weight matched sedentary norm). The VO2 a the ventilatory threshold was well below normal at 33.6% of the predicted peak VO2. At peak exercise the ventilation was 51% of the measured MVV indicating ventilatory reserve remained (respiratory rate was below the expected range, Vt/IC [90%] was at its limit, PETCO2 was high normal). There was an overall inappropriate HR response to the exercise with a HR reserve of 23 bpm. The O2pulse (a surrogate for stroke volume) increased throughout the exercise reaching 14 ml/beat (88% of predicted). The VE/VCO2 slope was mildly elevated suggesting some reduced ventilatory efficiency. The oxygen uptake efficiency slope (OUES) was mildly reduced and reflects the patient's measured functional capacity.  Conclusion: Exercise testing with gas exchange demonstrates a mild functional limitation when compared to matched sedentary norms. The patient's tidal volume during exercise appears to be a limiting factor, couple with the low respiratory rate. The elevated tidal volume during exercise can induce a sensation of not being able to get a full breath and may cause chest discomfort. There also appears to be significant deconditioning. ^^^Preliminary CPX Results, Finalized results will be  forwarded when completed by interpreting physician.^^^  Prepared by: Linzie Collin, MEd, ACSM-RCEP Sr. Exercise Physiologist 02/23/2013 2:52 PM   REC  Your breathing test suggests physical deconditioning whic could be due to the unexplained viral illness you had several years ago [facility] REfer Dr Chalmers Cater for endocrine causes of fatgue  REturn early Dec 2014 wih CT chest   OV 06/14/2013  FU   Dyspnea/fatigue and lung nodule  Dyspnea/FAigute  - stome improved. Beginning to workout. DR balan has him on LOw T replacement. STill he is not satisfied with level of improvement. Also describing some early moprning chest tightness in front of sternum when he gets up x 1 month. No reprodyucible tenderness. No fever. No chills. Reivew of records indicate that [place] have started him on prednisone in OCt 2013 but stopped March 2014. This was for GGO in lung.  However, in July 2014 he indicated rheum Dr Dierdre Forth started him on predinose. Review of med list shows that he is not in it right now  CT chest 06/04/13  IMPRESSION:  1. Stable small, nonspecific pulmonary nodules. Its is these nodules  have remained unchanged since 03/24/2012. The largest nodule  measures 5 mm. If this patient is at low risk for lung cancer then  no further followup is necessary. In a patient with increased risk  for lung cancer documented stability for 18-24 months is advised.  This recommendation follows the consensus statement: Guidelines for  Management of Small Pulmonary Nodules Detected on CT Scans: A  Statement from the Fleischner Society as published in Radiology  2005; 237:395-400.  Electronically Signed  By: Signa Kell M.D.  On: 06/04/2013 13:35          Result Notes    Notes Recorded by Kalman Shan, MD on 06/09/2013 at 1:02 AM Will disuccc CT results ov 06/14/13. Nodules stable x 1 year. Next Ct in 18months or so    Review of Systems  Constitutional:  Negative for fever and unexpected weight change.  HENT: Negative for congestion, dental problem, ear pain, nosebleeds, postnasal drip, rhinorrhea, sinus pressure, sneezing, sore throat and trouble swallowing.   Eyes: Negative for redness and itching.  Respiratory: Positive for shortness of breath. Negative for cough, chest tightness and wheezing.   Cardiovascular: Positive for chest pain. Negative for palpitations and leg swelling.  Gastrointestinal: Negative for nausea and vomiting.  Genitourinary: Negative for dysuria.  Musculoskeletal: Negative for joint swelling.  Skin: Negative for rash.  Neurological: Negative for headaches.  Hematological: Does not bruise/bleed easily.  Psychiatric/Behavioral: Negative for dysphoric mood. The patient is not nervous/anxious.    Current outpatient prescriptions:alfuzosin (UROXATRAL) 10 MG 24 hr tablet, Take 10 mg by mouth at bedtime. , Disp: , Rfl: ;  aspirin EC 81 MG EC tablet, Take 1 tablet (81 mg total) by mouth daily., Disp: , Rfl: ;  busPIRone (BUSPAR) 10 MG tablet, Take 10 mg by mouth 2 (two) times daily., Disp: , Rfl: ;  diltiazem (CARDIZEM CD) 120 MG 24 hr capsule, Take 120 mg by mouth 2 (two) times daily., Disp: , Rfl:  FLUoxetine (PROZAC) 20 MG capsule, Take 60 mg by mouth daily., Disp: , Rfl: ;  lamoTRIgine (LAMICTAL) 100 MG tablet, Take 100 mg by mouth 2 (two) times daily., Disp: , Rfl: ;  lansoprazole (PREVACID) 30 MG capsule, Take 30 mg by mouth 2 (two) times daily. , Disp: , Rfl:  nitroGLYCERIN (NITROSTAT) 0.4 MG SL tablet, Place 1 tablet (0.4 mg total) under the tongue every 5 (five) minutes as needed for chest pain (up to 3 doses)., Disp: 25 tablet, Rfl: 4;  oxyCODONE-acetaminophen (PERCOCET) 5-325 MG per tablet, Take 1-2 tablets by mouth every 4 (four) hours as needed for moderate pain or severe pain. As needed for pain., Disp: , Rfl:  Testosterone (ANDROGEL PUMP) 20.25 MG/ACT (1.62%) GEL, Place 1 application onto the skin 2 (two) times daily.  2 pumps daily, Disp: , Rfl: ;  albuterol (PROVENTIL HFA;VENTOLIN HFA) 108 (90 BASE) MCG/ACT inhaler, Inhale 2 puffs into the lungs daily as needed for wheezing or shortness of breath., Disp: , Rfl:      Objective:   Physical Exam  Nursing note and vitals reviewed. Constitutional: He is oriented to person, place, and time. He appears well-developed and well-nourished. No distress.  HENT:  Head: Normocephalic and atraumatic.  Right Ear: External ear normal.  Left Ear: External  ear normal.  Mouth/Throat: Oropharynx is clear and moist. No oropharyngeal exudate.  Scar in neck  Eyes: Conjunctivae and EOM are normal. Pupils are equal, round, and reactive to light. Right eye exhibits no discharge. Left eye exhibits no discharge. No scleral icterus.  Neck: Normal range of motion. Neck supple. No JVD present. No tracheal deviation present. No thyromegaly present.  Cardiovascular: Normal rate, regular rhythm and intact distal pulses.  Exam reveals no gallop and no friction rub.   No murmur heard. Pulmonary/Chest: Effort normal and breath sounds normal. No respiratory distress. He has no wheezes. He has no rales. He exhibits no tenderness.  Abdominal: Soft. Bowel sounds are normal. He exhibits no distension and no mass. There is no tenderness. There is no rebound and no guarding.  Musculoskeletal: Normal range of motion. He exhibits no edema and no tenderness.  Lymphadenopathy:    He has no cervical adenopathy.  Neurological: He is alert and oriented to person, place, and time. He has normal reflexes. No cranial nerve deficit. Coordination normal.  Skin: Skin is warm and dry. No rash noted. He is not diaphoretic. No erythema. No pallor.  Psychiatric: He has a normal mood and affect. His behavior is normal. Judgment and thought content normal.          Assessment & Plan:

## 2013-06-17 ENCOUNTER — Telehealth: Payer: Self-pay | Admitting: Internal Medicine

## 2013-06-17 NOTE — Telephone Encounter (Signed)
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 Can you find out what happened please?  Thanks  Dr. Kalman Franco, M.D., [facility].C.P Pulmonary and Critical Care Medicine Staff Physician [facility] Pager: [phone], If no answer or between  15:00h - 7:00h: call [phone]  06/17/2013 6:22 PM

## 2013-06-17 NOTE — Assessment & Plan Note (Signed)
#  Lung nodule  - return in 12-18 months with CT chest wo contrast  #FOllowup  - 12- 18 months iwht CT chest

## 2013-06-17 NOTE — Assessment & Plan Note (Signed)
#  SHortness of breath and fatigue  - continue exercises  - continue to follow Dr Talmage Nap advice

## 2013-06-22 NOTE — Telephone Encounter (Signed)
I spoke with pt. He reports he is not on prednisone and has not had any since March. Thanks

## 2013-06-28 NOTE — Telephone Encounter (Signed)
Thanks an noted

## 2013-09-13 ENCOUNTER — Telehealth: Payer: Self-pay | Admitting: Gastroenterology

## 2013-09-13 MED ORDER — LANSOPRAZOLE 30 MG PO CPDR: 30.0000 mg | DELAYED_RELEASE_CAPSULE | Freq: Two times a day (BID) | ORAL | Status: DC

## 2013-09-13 NOTE — Telephone Encounter (Signed)
Med sent- pt notified

## 2013-10-18 ENCOUNTER — Telehealth: Payer: Self-pay | Admitting: Cardiology

## 2013-10-18 MED ORDER — AMLODIPINE BESYLATE 5 MG PO TABS
5.0000 mg | ORAL_TABLET | Freq: Every day | ORAL | Status: DC
Start: 2013-10-18 — End: 2014-02-21

## 2013-10-18 NOTE — Telephone Encounter (Signed)
Pt feels diltiazem is causing a general feeling of weakness and tiredness. He has skipped it a couple of times and he felt better. He had 1 episode of chest pain a few weeks ago relived with 1 NTG. He feels like he cannot take diltiazem anymore but is willing to try a different medication. I will forward to Dr Aundra Dubin for review.

## 2013-10-18 NOTE — Telephone Encounter (Signed)
New message     Talk to a nurse regarding a medication he is on.  It is draining all of his energy.  He cannot think of the name of the medication

## 2013-10-18 NOTE — Telephone Encounter (Signed)
Pt advised,verbalized understanding. 

## 2013-10-18 NOTE — Telephone Encounter (Signed)
He can try amlodipine 5 mg daily instead to treat possible vasospasm.

## 2013-11-23 ENCOUNTER — Other Ambulatory Visit: Payer: Self-pay

## 2013-11-23 MED ORDER — NITROGLYCERIN 0.4 MG SL SUBL: 0.4000 mg | SUBLINGUAL_TABLET | SUBLINGUAL | Status: DC | PRN

## 2013-12-21 ENCOUNTER — Encounter: Payer: Self-pay | Admitting: Cardiology

## 2013-12-21 ENCOUNTER — Ambulatory Visit (INDEPENDENT_AMBULATORY_CARE_PROVIDER_SITE_OTHER): Payer: Medicare Other | Admitting: Cardiology

## 2013-12-21 VITALS — BP 110/78 | HR 68 | Ht 68.0 in | Wt 182.0 lb

## 2013-12-21 DIAGNOSIS — R079 Chest pain, unspecified: Secondary | ICD-10-CM | POA: Diagnosis not present

## 2013-12-21 DIAGNOSIS — R918 Other nonspecific abnormal finding of lung field: Secondary | ICD-10-CM

## 2013-12-21 NOTE — Progress Notes (Signed)
Patient ID: Richard Franco, male   DOB: 04/26/1966, 48 y.o.   MRN: [id number] PCP: Dr. Laurance Flatten  48 yo with history of chest pain and ground glass infiltrates on lung imaging was initially admitted with pleuritic chest pain and exertional dyspnea in 10/13.  He ended up having a left heart cath, which showed no significant coronary disease.  Echo was normal-appearing.  CTA of the chest, however, showed diffuse ground glass infiltrates.  Patient was treated with steroids and has been followed by pulmonary.  The steroids significantly improved his symptoms.  He had a transbronchial biopsy with no definite etiology lung findings noted, possible organizing pneumonia.  He was actually referred to pulmonary at [facility].  He says they just told him that whatever process it was that he had seemed to have resolved.  He was readmitted in 2/14 with chest pain and had troponin elevation to 4.42.  LHC was done again, again demonstrating normal coronaries.  Due to concern for coronary vasospasm, he was started on Imdur but was unable to tolerate due to headache so diltiazem CD was begun and titrated up to 360 mg daily, this was transitioned eventually to amlodipine.  He also had polyarthralgias and was referred to rheumatology.  Autoimmune workup was negative.  He was seen by pulmonary and had a CPX, suggesting deconditioning as a cause for his exertional dyspnea.    Chest pain is rare now.  When it happens, it can be at rest or with exertion: no definite pattern. Pain resolves with NTG.  He continues to fatigue quickly but is not particularly active. He is now on disability.     Labs (10/13): K 3.9, creatinine 1.15, ESR 10, HCT 40.5, SPEP negative, CRP < 0.5, ANA negative, ANCA negative, ACE level normal.   Labs (2/14): CPK normal, RF negative, slightly elevated ESR  ECG: NSR, normal  Allergies (verified):  1) ! Sulfa  2) ! Midodrine Hcl   Past Medical History:  1. Headache  2. Osteoarthritis  3. Depression  4.  Chronic cervical spine disease, status post multiple surgeries with chronic pain  5. Insomnia, late onset of sleep, and he sleeps until late in the morning  6. Tobacco abuse: Quit 10/13.  7. GERD  8. Chest pain: ETT-myoview (5/07): No ischemia or infarction. ETT (3/11) normal.  LHC (10/13): EF 60%, normal coronary arteries.  Admit with CP in 2/14, troponin 4.4.  LHC (2/14) with normal coronaries.  Possible coronary vasospasm, improved with calcium channel blockers.  9. Echo (10/13) with EF 55-60%, normal RV, normal valves.  10. Ground glass lung infiltrates: Admitted with pleuritic chest pain in 10/13.  CTA chest showed multiple areas of ground glass opacity.  Transbronchial biopsy unrevealing.  Improved with steroids.  Ddx = infection versus eosinophilic PNA versus organizing PNA versus pneumoconiosis.  11. Palpitations 12. Hyperlipidemia 13. Polyarthralgia of uncertain etiology: Negative workup by rheumatology.  14. CPX (9/14): peak VO2 25.6 (76% predicted), VE/VCO2 31.2, RER 1.28, dyspnea thought due to deconditioning.   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  Married Quit smoking in 10/13.  Alcohol use-yes  Drug use-no  remote marijuana, cocaine  [place]. [place] animals.  One outside dog.   Current Outpatient Prescriptions  Medication Sig Dispense Refill  . amLODipine (NORVASC) 5 MG tablet Take 1  tablet (5 mg total) by mouth daily.  30 tablet  3  . aspirin EC 81 MG EC tablet Take 1 tablet (81 mg total) by mouth daily.      . busPIRone (BUSPAR) 10 MG tablet Take 10 mg by mouth 2 (two) times daily.      Marland Kitchen FLUoxetine (PROZAC) 20 MG capsule Take 60 mg by mouth daily.      Marland Kitchen lamoTRIgine (LAMICTAL) 100 MG tablet Take 100 mg by mouth 2 (two) times daily.      . lansoprazole (PREVACID) 30  MG capsule Take 1 capsule (30 mg total) by mouth 2 (two) times daily.  60 capsule  3  . nitroGLYCERIN (NITROSTAT) 0.4 MG SL tablet Place 1 tablet (0.4 mg total) under the tongue every 5 (five) minutes as needed for chest pain (up to 3 doses).  25 tablet  0  . oxyCODONE-acetaminophen (PERCOCET) 5-325 MG per tablet Take 1-2 tablets by mouth every 4 (four) hours as needed for moderate pain or severe pain. As needed for pain.      . Testosterone (ANDROGEL PUMP) 20.25 MG/ACT (1.62%) GEL Place 1 application onto the skin 2 (two) times daily. 2 pumps daily       No current facility-administered medications for this visit.   BP 110/78  Pulse 68  Ht '5\' 8"'  (1.727 m)  Wt 182 lb (82.555 kg)  BMI 27.68 kg/m2 General: NAD Neck: No JVD, no thyromegaly or thyroid nodule.  Lungs: Clear to auscultation bilaterally with normal respiratory effort. CV: Nondisplaced PMI.  Heart regular S1/S2, no S3/S4, no murmur.  No peripheral edema.  No carotid bruit.  Normal pedal pulses.  Abdomen: Soft, nontender, no hepatosplenomegaly, no distention.   Neurologic: Alert and oriented x 3.  Psych: Normal affect. Extremities: No clubbing or cyanosis.   Assessment/Plan: 1. Coronary vasospasm: Based on symptoms and elevated troponin with normal coronaries on cath, suspect coronary artery vasospasm.  He is significantly improved on calcium channel blocker (amlodipine).  He is unable to tolerate Imdur.  2. Palpitations: Suspect premature beats.  Much improved with cutting back on caffeine.  3. Ground glass infiltrates: Undifferentiated lung disease.  Possible organizing pneumonia  He has improved with steroids.  He has been seeing pulmonary.  Transbronchial biopsy was not revealing. Seems to have resolved spontaneously.  4. Hyperlipidemia: Off pravastatin now due to myalgias with no improvement in myalgias.  5. Myalgias/polyarthralgias: Negative evaluation by rheumatology.  6. Fatigue: Suspect deconditioning (supported by CPX in  9/14).  Suggested that he start exercising, would like to see him walk 4-5 times/week.   Loralie Champagne 12/21/2013

## 2013-12-21 NOTE — Patient Instructions (Signed)
Your physician wants you to follow-up in: 1 year with Dr Aundra Dubin. (June 2016). You will receive a reminder letter in the mail two months in advance. If you don't receive a letter, please call our office to schedule the follow-up appointment.

## 2014-01-17 ENCOUNTER — Ambulatory Visit (INDEPENDENT_AMBULATORY_CARE_PROVIDER_SITE_OTHER): Payer: Medicare Other | Admitting: Family Medicine

## 2014-01-17 ENCOUNTER — Encounter: Payer: Self-pay | Admitting: Family Medicine

## 2014-01-17 VITALS — BP 125/80 | HR 80 | Temp 97.8°F | Ht 68.0 in | Wt 180.6 lb

## 2014-01-17 DIAGNOSIS — L0292 Furuncle, unspecified: Secondary | ICD-10-CM | POA: Diagnosis not present

## 2014-01-17 DIAGNOSIS — L0293 Carbuncle, unspecified: Secondary | ICD-10-CM

## 2014-01-17 MED ORDER — DOXYCYCLINE HYCLATE 100 MG PO TABS: 100.0000 mg | ORAL_TABLET | Freq: Two times a day (BID) | ORAL | Status: DC

## 2014-01-17 NOTE — Progress Notes (Signed)
   Subjective:    Patient ID: Richard Franco, male    DOB: 1965/07/12, 48 y.o.   MRN: [id number]  HPI  This 48 y.o. male presents for evaluation of rash on legs.  Review of Systems C/o rash on legs   No chest pain, SOB, HA, dizziness, vision change, N/V, diarrhea, constipation, dysuria, urinary urgency or frequency, myalgias, arthralgias.  Objective:   Physical Exam  Vital signs noted  Well developed well nourished male.  HEENT - Head atraumatic Normocephalic Respiratory - Lungs CTA bilateral Cardiac - RRR S1 and S2 without murmur Skin - erythematous patches bilateral legs with central clearing     Assessment & Plan:  Furuncle - Plan: doxycycline (VIBRA-TABS) 100 MG tablet  Lysbeth Penner FNP

## 2014-01-19 ENCOUNTER — Other Ambulatory Visit: Payer: Self-pay | Admitting: Gastroenterology

## 2014-02-21 ENCOUNTER — Other Ambulatory Visit: Payer: Self-pay | Admitting: Gastroenterology

## 2014-02-21 ENCOUNTER — Other Ambulatory Visit: Payer: Self-pay | Admitting: Cardiology

## 2014-03-25 ENCOUNTER — Other Ambulatory Visit: Payer: Self-pay | Admitting: Gastroenterology

## 2014-04-05 ENCOUNTER — Ambulatory Visit: Payer: Medicare Other | Admitting: Family

## 2014-04-05 DIAGNOSIS — G44209 Tension-type headache, unspecified, not intractable: Secondary | ICD-10-CM | POA: Diagnosis not present

## 2014-04-06 ENCOUNTER — Telehealth: Payer: Self-pay | Admitting: Internal Medicine

## 2014-04-06 DIAGNOSIS — R911 Solitary pulmonary nodule: Secondary | ICD-10-CM

## 2014-04-06 NOTE — Telephone Encounter (Signed)
Per 06/14/13 OV w/ MR; Patient Instructions      #Lung nodule  - return in 12-18 months with CT chest wo contrast #SHortness of breath and fatigue  - continue exercises  - continue to follow Dr Chalmers Cater advice #FOllowup  - 12- 18 months iwht CT chest           MR schedule is not out for December yet. Recall is in [facility] for this already. Order for CT already placed but is aware it is not due until December. Pt voiced his understanding. Nothing further needed

## 2014-04-14 ENCOUNTER — Encounter: Payer: Self-pay | Admitting: Nurse Practitioner

## 2014-04-14 ENCOUNTER — Ambulatory Visit (INDEPENDENT_AMBULATORY_CARE_PROVIDER_SITE_OTHER): Payer: Medicare Other | Admitting: Nurse Practitioner

## 2014-04-14 ENCOUNTER — Ambulatory Visit (INDEPENDENT_AMBULATORY_CARE_PROVIDER_SITE_OTHER): Payer: Medicare Other

## 2014-04-14 VITALS — BP 122/79 | HR 82 | Temp 97.5°F | Ht 68.0 in | Wt 184.6 lb

## 2014-04-14 DIAGNOSIS — S6992XA Unspecified injury of left wrist, hand and finger(s), initial encounter: Secondary | ICD-10-CM | POA: Diagnosis not present

## 2014-04-14 DIAGNOSIS — S62609A Fracture of unspecified phalanx of unspecified finger, initial encounter for closed fracture: Secondary | ICD-10-CM

## 2014-04-14 NOTE — Patient Instructions (Signed)

## 2014-04-14 NOTE — Progress Notes (Signed)
   Subjective:    Patient ID: Richard Franco, male    DOB: July 04, 1965, 48 y.o.   MRN: [id number]  HPI Patient dfell last night at home and landed on his left 5th finger. Swollen and tender to touch    Review of Systems  Constitutional: Negative.   Respiratory: Negative.   Cardiovascular: Negative.   All other systems reviewed and are negative.      Objective:   Physical Exam  Constitutional: He appears well-developed and well-nourished.  Cardiovascular: Normal rate, regular rhythm and normal heart sounds.   Pulmonary/Chest: Effort normal and breath sounds normal.  Musculoskeletal:  Left fifth finger swollen and tender to touch- cannot bend tip of finger  Neurological: He is alert.  Skin: Skin is warm and dry.  Psychiatric: He has a normal mood and affect. His behavior is normal. Judgment and thought content normal.   BP 122/79  Pulse 82  Temp(Src) 97.5 F (36.4 C) (Oral)  Ht 5\' 8"  (1.727 m)  Wt 184 lb 9.6 oz (83.734 kg)  BMI 28.07 kg/m2   Laft 5th finger - distal phalanax fracture-Preliminary reading by Ronnald Collum, FNP  [facility]      Assessment & Plan:  . 1. Finger injury, left, initial encounter   2. Finger fracture, closed, initial encounter    Ice Tylenol RTO prn  Mary-Margaret Hassell Done, FNP

## 2014-05-06 ENCOUNTER — Other Ambulatory Visit: Payer: Self-pay | Admitting: Gastroenterology

## 2014-05-23 DIAGNOSIS — H578 Other specified disorders of eye and adnexa: Secondary | ICD-10-CM | POA: Diagnosis not present

## 2014-06-02 ENCOUNTER — Encounter (HOSPITAL_COMMUNITY): Payer: Self-pay | Admitting: Cardiovascular Disease

## 2014-06-06 ENCOUNTER — Ambulatory Visit (INDEPENDENT_AMBULATORY_CARE_PROVIDER_SITE_OTHER)
Admission: RE | Admit: 2014-06-06 | Discharge: 2014-06-06 | Disposition: A | Discharge status: Home / Self Care | Payer: Medicare Other | Source: Ambulatory Visit | Attending: Internal Medicine | Admitting: Internal Medicine

## 2014-06-06 DIAGNOSIS — R911 Solitary pulmonary nodule: Secondary | ICD-10-CM

## 2014-06-06 DIAGNOSIS — R918 Other nonspecific abnormal finding of lung field: Secondary | ICD-10-CM | POA: Diagnosis not present

## 2014-06-08 ENCOUNTER — Other Ambulatory Visit: Payer: Self-pay | Admitting: Gastroenterology

## 2014-06-10 ENCOUNTER — Telehealth: Payer: Self-pay | Admitting: Nurse Practitioner

## 2014-06-10 NOTE — Progress Notes (Signed)
Quick Note:  lmtcb for pt. ______ 

## 2014-06-10 NOTE — Telephone Encounter (Signed)
Pt given appt with MMM 12/22 @ 10:00.

## 2014-06-13 NOTE — Progress Notes (Signed)
Quick Note:  Pt called. Informed pt of the results and recs per MR. Pt verbalized understanding and denied any further questions or concerns at this time. ______

## 2014-06-14 ENCOUNTER — Ambulatory Visit (INDEPENDENT_AMBULATORY_CARE_PROVIDER_SITE_OTHER): Payer: Medicare Other | Admitting: Nurse Practitioner

## 2014-06-14 ENCOUNTER — Encounter: Payer: Self-pay | Admitting: Nurse Practitioner

## 2014-06-14 ENCOUNTER — Ambulatory Visit (INDEPENDENT_AMBULATORY_CARE_PROVIDER_SITE_OTHER): Payer: Medicare Other

## 2014-06-14 VITALS — BP 128/78 | HR 80 | Temp 97.0°F | Ht 68.0 in | Wt 182.0 lb

## 2014-06-14 DIAGNOSIS — S62607S Fracture of unspecified phalanx of left little finger, sequela: Secondary | ICD-10-CM | POA: Diagnosis not present

## 2014-06-14 DIAGNOSIS — S61412A Laceration without foreign body of left hand, initial encounter: Secondary | ICD-10-CM | POA: Diagnosis not present

## 2014-06-14 DIAGNOSIS — R300 Dysuria: Secondary | ICD-10-CM | POA: Diagnosis not present

## 2014-06-14 LAB — POCT URINALYSIS DIPSTICK
Bilirubin, UA: NEGATIVE
Glucose, UA: NEGATIVE
Ketones, UA: NEGATIVE
Leukocytes, UA: NEGATIVE
Nitrite, UA: NEGATIVE
Protein, UA: NEGATIVE
Spec Grav, UA: 1.025
Urobilinogen, UA: NEGATIVE
pH, UA: 5

## 2014-06-14 LAB — POCT UA - MICROSCOPIC ONLY
Bacteria, U Microscopic: NEGATIVE
Casts, Ur, LPF, POC: NEGATIVE
Crystals, Ur, HPF, POC: NEGATIVE
Mucus, UA: NEGATIVE
WBC, Ur, HPF, POC: NEGATIVE
Yeast, UA: NEGATIVE

## 2014-06-14 MED ORDER — DOXYCYCLINE HYCLATE 100 MG PO TABS: 100.0000 mg | ORAL_TABLET | Freq: Two times a day (BID) | ORAL | Status: DC

## 2014-06-14 NOTE — Progress Notes (Signed)
   Subjective:    Patient ID: Richard Franco, male    DOB: 1965/12/07, 48 y.o.   MRN: [id number]  HPI patient in with 2 complaints: - Had a fractured finger 4 weeks ago and it is still sore and swollen and difficult to fully bend. - Burning with urination    Review of Systems  Constitutional: Negative.   HENT: Negative.   Respiratory: Negative.   Cardiovascular: Negative.   Genitourinary: Positive for dysuria, urgency and frequency.  Neurological: Negative.   Psychiatric/Behavioral: Negative.   All other systems reviewed and are negative.      Objective:   Physical Exam  Constitutional: He appears well-developed and well-nourished.  Cardiovascular: Normal rate, regular rhythm and normal heart sounds.   Pulmonary/Chest: Effort normal and breath sounds normal.  Musculoskeletal:  Decreased ROM of left little finger due to pain with flexion. Edema at proximal PIP joint.   BP 128/78 mmHg  Pulse 80  Temp(Src) 97 F (36.1 C) (Oral)  Ht $R'5\' 8"'YP$  (1.727 m)  Wt 182 lb (82.555 kg)  BMI 27.68 kg/m2  Finger x ray- healing fracture tuft distal phalanxs well as subtle fractue distal tip of proximal phalanax       Assessment & Plan:  1. Dysuria Labs pending - POCT UA - Microscopic Only - POCT urinalysis dipstick - CMP14+EGFR - doxycycline (VIBRA-TABS) 100 MG tablet; Take 1 tablet (100 mg total) by mouth 2 (two) times daily.  Dispense: 20 tablet; Refill: 0  2. Fracture of fifth finger, left, closed, sequela Nothing else can be done - DG Finger Little Left; Future   Mary-Margaret Hassell Done, FNP

## 2014-06-15 LAB — CMP14+EGFR
ALT: 25 IU/L (ref 0–44)
AST: 20 IU/L (ref 0–40)
Albumin/Globulin Ratio: 2.1 (ref 1.1–2.5)
Albumin: 4.5 g/dL (ref 3.5–5.5)
Alkaline Phosphatase: 93 IU/L (ref 39–117)
BUN/Creatinine Ratio: 6 — ABNORMAL LOW (ref 9–20)
BUN: 8 mg/dL (ref 6–24)
CO2: 25 mmol/L (ref 18–29)
Calcium: 9.7 mg/dL (ref 8.7–10.2)
Chloride: 100 mmol/L (ref 97–108)
Creatinine, Ser: 1.26 mg/dL (ref 0.76–1.27)
GFR calc Af Amer: 78 mL/min/{1.73_m2} (ref >59)
GFR calc non Af Amer: 67 mL/min/{1.73_m2} (ref >59)
Globulin, Total: 2.1 g/dL (ref 1.5–4.5)
Glucose: 99 mg/dL (ref 65–99)
Potassium: 4.4 mmol/L (ref 3.5–5.2)
Sodium: 141 mmol/L (ref 134–144)
Total Bilirubin: 0.4 mg/dL (ref 0.0–1.2)
Total Protein: 6.6 g/dL (ref 6.0–8.5)

## 2014-06-28 ENCOUNTER — Ambulatory Visit: Payer: Medicare Other | Admitting: Internal Medicine

## 2014-06-28 DIAGNOSIS — M5136 Other intervertebral disc degeneration, lumbar region: Secondary | ICD-10-CM | POA: Diagnosis not present

## 2014-06-28 DIAGNOSIS — Z981 Arthrodesis status: Secondary | ICD-10-CM | POA: Diagnosis not present

## 2014-06-28 DIAGNOSIS — M25551 Pain in right hip: Secondary | ICD-10-CM | POA: Diagnosis not present

## 2014-06-28 DIAGNOSIS — Z6826 Body mass index (BMI) 26.0-26.9, adult: Secondary | ICD-10-CM | POA: Diagnosis not present

## 2014-06-28 DIAGNOSIS — M4726 Other spondylosis with radiculopathy, lumbar region: Secondary | ICD-10-CM | POA: Diagnosis not present

## 2014-06-28 DIAGNOSIS — M5416 Radiculopathy, lumbar region: Secondary | ICD-10-CM | POA: Diagnosis not present

## 2014-06-28 DIAGNOSIS — M5442 Lumbago with sciatica, left side: Secondary | ICD-10-CM | POA: Diagnosis not present

## 2014-07-07 ENCOUNTER — Other Ambulatory Visit: Payer: Self-pay | Admitting: Gastroenterology

## 2014-07-19 ENCOUNTER — Ambulatory Visit (INDEPENDENT_AMBULATORY_CARE_PROVIDER_SITE_OTHER): Payer: Medicare Other | Admitting: Internal Medicine

## 2014-07-19 ENCOUNTER — Encounter: Payer: Self-pay | Admitting: Internal Medicine

## 2014-07-19 VITALS — BP 116/72 | HR 91 | Ht 69.0 in | Wt 183.0 lb

## 2014-07-19 DIAGNOSIS — Z720 Tobacco use: Secondary | ICD-10-CM | POA: Diagnosis not present

## 2014-07-19 DIAGNOSIS — R5382 Chronic fatigue, unspecified: Secondary | ICD-10-CM | POA: Diagnosis not present

## 2014-07-19 DIAGNOSIS — Z87891 Personal history of nicotine dependence: Secondary | ICD-10-CM

## 2014-07-19 DIAGNOSIS — R0600 Dyspnea, unspecified: Secondary | ICD-10-CM | POA: Diagnosis not present

## 2014-07-19 DIAGNOSIS — R911 Solitary pulmonary nodule: Secondary | ICD-10-CM | POA: Diagnosis not present

## 2014-07-19 NOTE — Progress Notes (Signed)
Subjective:    Patient ID: Richard Franco, male    DOB: 12-Dec-1965, 49 y.o.   MRN: [id number]  HPI  HPI 49 year old male with PMH of pericarditis one year ago of unknown reason who persistently had CP since. Presented to the cardiologist with atypical chest pain, somewhat pleuritic in nature.  Cath was performed that was normal. CTA of the chest was done that revealed 3 <1 cm pulmonary nodules and multiple areas of GGO.  Patient has been more SOB over the past 3 months. On further enquires he states that he noticed shortness of breath over the past several years, at rest and with exertion, associated with chest tightnes. Extensive cardiology evaluation did not reveal a cardiac etiology. In average reports daily symptoms,  2-3 times a day, waking up approximately 2 nights per month with dyspnea.  During hospitalization he was initiated on steroids. He endorsed improved symptoms with steroids. Denies cough or wheezing, but does states that sometimes when he is short of breath he hears a noise coming from his throat.   04/02/2012 Steroids tapered off. He did not not report a significant difference off steroids.   04/16/2012 Was seen by Dr. Melvyn Novas in clinic; CT chest showed persistent lung nodules and ground glass opacities in a different territory distribution. Steroids restarted at 20 mg daily; the patient was instructed to taper them to 10 mg.   04/30/12 The patient on 10 mg of prednisone daily;   05/06/12 Bronchoscopy with BAL, cell count and TBBx of the lingula  BAL cell count with elevated macrophages (possible COP, Hypersensitivity pneumonitis vs chronic microaspiration, possible Aluminum exposure)  RVP neg Viral culture neg  AFB smear neg, culture negative   Cytology/pathology benign with no evidence of aspiration or ILD    05/15/12  Remains on 10 mg steroids;  -methacholine challenge neg -barium swallow WNL  07/02/11 present visit -CT chest with resolution of GGO  - persistent  nodules  -steroids decreased to 5 mg   09/16/12  -CT with no GGO, lung nodules stable  -advised to stop steroids  Complains of shortness of breath at rest and with exertion; chest tightness but no wheezing; Denies cough, sputum production, hemoptysis, fever, chills, rash, PND, orthopnea, LE edema, calf tenderness or LE edema. Has had muscle pain after initiating statins; in the interim resolved.   Since the previous visit he presented to the ED and was found to have elevated troponins heart catheterization negative for CAD. He continues to have chest tightness, initiated cardizem and now reports feeling better.   Acute OV 11/02/12 --  Hx of dyspnea, chest discomfort felt to be coronary vasospasm, GGI on CT chest that resolved as of 08/2012 CT scan. Presents today c/o about 3 weeks of tachycardia w exertion, some cough for ~ a week, usually non-productive, chest tightness. Saw some dark specks in his mucous several times w cough 10/22/12, no over blood, none since then. He is having nasal congestion and allergy sx. No sick contacts. He stopped prednisone in mid March. Of note he has had progressive joint pain in his fingers and hands since stopping the prednisone.   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 No other autoimmune manifestations and no changes in environment. No drug use.   Occupation: toxic exposure to aluminum, steel, plastic dust Travels:no travels abroad TB exposure:denies Pets: none  REC Please start loratadine 10mg  daily  Please start fluticasone nasal spray, 2 sprays  each side twice a day  CXR today  Follow with Dr Chase Caller in 4 weeks to assess your symptoms. You may need to discuss repeating your CT scan of the chest at that time.    OV 01/14/2013   He has multiple issues which include arthritis, fibromyalgia, depression, GERD, anxiety, migraines, and tobacco abuse (quit smoking 9 months ago). He has  had a recent NSTEMI with cath showing normal coronary arteries, EF of 55 to 65%. This was felt to be due to vasospasm. D dimer was positive but CT was negative for PE. He has had a chronically abnormal chest CT. March 2014: shown stable scattered bilateral pulmonary nodules most c/w underlying infectious/inflammatory or granulomatous proces 49mm - 74mm; plan to repeat in 6-12 months which would be Oct 2014/March 2015   Now following with pumonray. Transition of care from Dr Conception Chancy to Dr Chase Caller. Since prior pulmonary visit in the interim Has see rheum Dr Amil Amen recently an dplaced on 10 day prednisone. Is having autoimmune panel repeated by rheumatology This is for diffuse joint pain and some myalgia.  In terms of pulmonary symptoms: Still has chronic early morning chest tightness and not relieved by albuterol. Refrains from NTG use .,.Adiditonally still with moderate dyspnea on exertion, esp for hills at golf. Relieved by rest. Unknown if releived by NTG; refrains. Associated chest ttightness + No wheeze or cough or syncope. STable. Insidious onset of months to years. No PND or orthopnea  [employer]  reports that he quit smoking about 9 months ago. His smoking use included Cigarettes. He has a 27 pack-year smoking history. He has quit using smokeless tobacco. His smokeless tobacco use included Chew.   Labs  normal spirometry oct and  nov 2013 Oct 2013: autoimmne ANA, ACE, ANCA, RF, - negative March 2014 CT chest: clearing GGI but small nodules 74mm x 2  REC Pleae have cardiopulmonary stress tst  Return to see me after that    OV 03/01/2013 Follow up dyspnea and fatigue. He had cardiopulmonary stress test on 02/23/2013 and the results of the lobe and suggest physical deconditioning as a cause for dyspnea and fatigue. He surprised that the result because he says that he does activities of daily living and is doing yard work and also walk in [employer] and other work in [employer]. However, he states  that symptoms have been persisting since 2006/2008 and he had a viral illness following exposure to his son with  respiratory viru and who in turn was exposed to a kid with measles. He states that all his dyspnea and fatigue his since then even though he recovered from the acute illness. He recollects being infectious diseases at [place] and recollects changing diagnoses in 2008. He starts are not available to me but I do note that so the results are mentioned in July 2008. He was finally told that he had parvovirus infection.    also he states that he is known to have low testosterone and prior thyroid issues but does not have endocrinologist on record  CPST 02/23/2013  Procedure: This patient underwent staged symptom-limited exercise testing using a individualized bike protocol with expired gas analysis metabolic evaluation during exercise.  Demographics  Age: 48 Ht. (in.) 65 Wt. (lb) 178 BMI: 27.1 Predicted Peak VO2: 33.6 ml/kg/min  Gender: Male Ht (cm) 172.7 Wt. (kg) 80.7    Results  Pre-Exercise PFTs   FVC 4.87 (101%)   FEV1 3.93 (103%)   FEV1/FVC 81   MVV 162 (107%)  Post-Exercise PFTs (from lowest post-exercise trial (%change from rest))  FVC 4.87 (0%)   FEV1 3.95 (0%)   FEV1/FVC 80%    Exercise Time: 9:24 Watts: 180  RPE: 20  Reason stopped: Patient stopped due to leg fatigue and dyspnea (7/10)  Additional symptoms: Chest discomfort (upper left chest) 4/10  Resting HR: 72 Peak HR: 151 (87% age predicted max HR)  BP rest: 113/78 BP peak: 190/64  Peak VO2: 25.6 (76.2% predicted peak VO2)  VE/VCO2 slope: 31.2  OUES: 1.98  Peak RER: 1.28  Ventilatory Threshold: 11.3 (33.6% predicted peak VO2)  Peak RR 30  Peak Ventilation: 82.8  VE/MVV: 51.2%  PETCO2 at peak: 37  O2pulse: 14 (88% predicted O2pulse)   Interpretation  Notes: Patient gave a very good effort. The pulse-oximetry remained at 96% or greater throughout the exercise.  Exercise was performed on a cycle ergometer starting at [facility] and increasing by 20 W/min.  ECG: Resting ECG in normal sinus rhythm. There was an adequate HR response to the exercise with no ST-T changes or arrhythmias, however patient did not reach age predicted maximal HR. The BP response was appropriate.  PFT: Resting spirometry within normal limits. The MVV was normal. Post-exercise spirometry was performed IPE, 5, 10, 15 and 20 (reported above) minutes of recovery with no significant changes from rest to indicate respiratory muscle fatigue or EIB.  CPX: The RER of 1.28 indicates a maximal effort. The peak VO2 is mildly reduced at 25.6 ml/kg/min (76% of the age/gender/weight matched sedentary norm). The VO2 a the ventilatory threshold was well below normal at 33.6% of the predicted peak VO2. At peak exercise the ventilation was 51% of the measured MVV indicating ventilatory reserve remained (respiratory rate was below the expected range, Vt/IC [90%] was at its limit, PETCO2 was high normal). There was an overall inappropriate HR response to the exercise with a HR reserve of 23 bpm. The O2pulse (a surrogate for stroke volume) increased throughout the exercise reaching 14 ml/beat (88% of predicted). The VE/VCO2 slope was mildly elevated suggesting some reduced ventilatory efficiency. The oxygen uptake efficiency slope (OUES) was mildly reduced and reflects the patient's measured functional capacity.  Conclusion: Exercise testing with gas exchange demonstrates a mild functional limitation when compared to matched sedentary norms. The patient's tidal volume during exercise appears to be a limiting factor, couple with the low respiratory rate. The elevated tidal volume during exercise can induce a sensation of not being able to get a full breath and may cause chest discomfort. There also appears to be significant deconditioning. ^^^Preliminary CPX Results, Finalized results will be  forwarded when completed by interpreting physician.^^^  Prepared by: Linzie Collin, MEd, ACSM-RCEP Sr. Exercise Physiologist 02/23/2013 2:52 PM   REC  Your breathing test suggests physical deconditioning whic could be due to the unexplained viral illness you had several years ago [facility] REfer Dr Chalmers Cater for endocrine causes of fatgue  REturn early Dec 2014 wih CT chest   OV 06/14/2013  FU   Dyspnea/fatigue and lung nodule  Dyspnea/FAigute  - stome improved. Beginning to workout. DR balan has him on LOw T replacement. STill he is not satisfied with level of improvement. Also describing some early moprning chest tightness in front of sternum when he gets up x 1 month. No reprodyucible tenderness. No fever. No chills. Reivew of records indicate that [place] have started him on prednisone in OCt 2013 but stopped March 2014. This was for GGO in lung.  However, in July 2014 he indicated rheum Dr Amil Amen started him on predinose. Review of med list shows that he is not in it right now  CT chest 06/04/13  IMPRESSION:  1. Stable small, nonspecific pulmonary nodules. Its is these nodules  have remained unchanged since 03/24/2012. The largest nodule  measures 5 mm. If this patient is at low risk for lung cancer then  no further followup is necessary. In a patient with increased risk  for lung cancer documented stability for 18-24 months is advised.  This recommendation follows the consensus statement: Guidelines for  Management of Small Pulmonary Nodules Detected on CT Scans: A  Statement from the [place] as published in Radiology  2005; 237:395-400.  Electronically Signed  By: Kerby Moors M.D.  On: 06/04/2013 13:35          Result Notes    Notes Recorded by Brand Males, MD on 06/09/2013 at 1:02 AM Will disuccc CT results ov 06/14/13. Nodules stable x 1 year. Next Ct in 51months or so     OV 07/19/2014  Chief Complaint    Patient presents with  . Follow-up    Pt stated his breathing is unchanged since last OV. Pt c/o DOE and CP when active. Pt denies cough.    One-year follow-up for pulmonary nodule in association with dyspnea, fatigue and prior smoking history  This past one year he says that he does not have dyspnea anymore or any respiratory symptoms for that matter. However, he continues to have chronic fatigue associated with arthralgia. He has not followed up with his rheumatologist Dr. Amil Amen was previously deemed that he has not found to have any autoimmune disease. He has not followed up with Dr. Chalmers Cater his endocrinologist. He continues on testosterone patch but this does not seem to help him. Overall he is frustrated by situation. Smoking continues to be in remission  I did elicit from him that as a young man he used to work out excessively in the gym lifting weights and doing aerobics. However in the last several years she's not doing this anymore and he is physically deconditioned. It is possible that this is an etiology for his fatigue  Review of Systems  Constitutional: Negative for fever and unexpected weight change.  HENT: Negative for congestion, dental problem, ear pain, nosebleeds, postnasal drip, rhinorrhea, sinus pressure, sneezing, sore throat and trouble swallowing.   Eyes: Negative for redness and itching.  Respiratory: Positive for chest tightness and shortness of breath. Negative for cough and wheezing.   Cardiovascular: Negative for palpitations and leg swelling.  Gastrointestinal: Negative for nausea and vomiting.  Genitourinary: Negative for dysuria.  Musculoskeletal: Negative for joint swelling.  Skin: Negative for rash.  Neurological: Negative for headaches.  Hematological: Does not bruise/bleed easily.  Psychiatric/Behavioral: Negative for dysphoric mood. The patient is not nervous/anxious.     Current outpatient prescriptions:  .  amLODipine (NORVASC) 5 MG tablet, TAKE ONE  TABLET BY MOUTH ONCE DAILY, Disp: 30 tablet, Rfl: 5 .  aspirin EC 81 MG EC tablet, Take 1 tablet (81 mg total) by mouth daily., Disp: , Rfl:  .  busPIRone (BUSPAR) 10 MG tablet, Take 10 mg by mouth 2 (two) times daily., Disp: , Rfl:  .  FLUoxetine (PROZAC) 20 MG capsule, Take 60 mg by mouth daily., Disp: , Rfl:  .  lamoTRIgine (LAMICTAL) 100 MG tablet, Take 100 mg by mouth 2 (two) times daily., Disp: , Rfl:  .  lansoprazole (PREVACID) 30 MG capsule, TAKE  ONE CAPSULE BY MOUTH TWICE DAILY, Disp: 60 capsule, Rfl: 0 .  nitroGLYCERIN (NITROSTAT) 0.4 MG SL tablet, Place 1 tablet (0.4 mg total) under the tongue every 5 (five) minutes as needed for chest pain (up to 3 doses)., Disp: 25 tablet, Rfl: 0 .  oxyCODONE-acetaminophen (PERCOCET) 5-325 MG per tablet, Take 1-2 tablets by mouth every 4 (four) hours as needed for moderate pain or severe pain. As needed for pain., Disp: , Rfl:  .  Testosterone (ANDROGEL PUMP) 20.25 MG/ACT (1.62%) GEL, Place 1 application onto the skin 2 (two) times daily. 2 pumps daily, Disp: , Rfl:       Objective:   Physical Exam  Constitutional: He is oriented to person, place, and time. He appears well-developed and well-nourished. No distress.  HENT:  Head: Normocephalic and atraumatic.  Right Ear: External ear normal.  Left Ear: External ear normal.  Mouth/Throat: Oropharynx is clear and moist. No oropharyngeal exudate.  Eyes: Conjunctivae and EOM are normal. Pupils are equal, round, and reactive to light. Right eye exhibits no discharge. Left eye exhibits no discharge. No scleral icterus.  Neck: Normal range of motion. Neck supple. No JVD present. No tracheal deviation present. No thyromegaly present.  Cardiovascular: Normal rate, regular rhythm and intact distal pulses.  Exam reveals no gallop and no friction rub.   No murmur heard. Pulmonary/Chest: Effort normal and breath sounds normal. No respiratory distress. He has no wheezes. He has no rales. He exhibits no  tenderness.  Abdominal: Soft. Bowel sounds are normal. He exhibits no distension and no mass. There is no tenderness. There is no rebound and no guarding.  Musculoskeletal: Normal range of motion. He exhibits no edema or tenderness.  Lymphadenopathy:    He has no cervical adenopathy.  Neurological: He is alert and oriented to person, place, and time. He has normal reflexes. No cranial nerve deficit. Coordination normal.  Skin: Skin is warm and dry. No rash noted. He is not diaphoretic. No erythema. No pallor.  Psychiatric: He has a normal mood and affect. His behavior is normal. Judgment and thought content normal.  Nursing note and vitals reviewed.    Filed Vitals:   07/19/14 1411  BP: 116/72  Pulse: 91  Height: 5\' 9"  (1.753 m)  Weight: 183 lb (83.008 kg)  SpO2: 98%          Assessment & Plan:     ICD-9-CM ICD-10-CM   1. Pulmonary nodule 793.11 R91.1   2. Dyspnea 786.09 R06.00   3. Smoking history V15.82 Z72.0   4. Chronic fatigue 780.79 R53.82    #Lung nodule  -dec 2015 ct chest show nodules are stable since dec 2013 - no further CT chest  #SMoking history  - when you turn 56 years talk to Chevis Pretty, FNP about lung cancer screening  #SHortness of breath   - glad this is not an issue  #Fatigue - Could be related to deconditioning  - go back and see DR Amil Amen or DR Chalmers Cater  #FOllowup  - as needed  (> 50% of this 15 min visit spent in face to face counseling or/and coordination of care)     Dr. Brand Males, M.D., [facility].C.P Pulmonary and Critical Care Medicine Staff Physician [facility] Pager: [phone], If no answer or between  15:00h - 7:00h: call [phone]  07/19/2014 2:42 PM

## 2014-07-19 NOTE — Patient Instructions (Addendum)
#  Lung nodule  -dec 2015 ct chest show nodules are stable since dec 2013 - no further CT chest  #SMoking history  - when you turn 56 years talk to Chevis Pretty, FNP about lung cancer screening  #SHortness of breath   - glad this is not an issue  #Fatigue  - go back and see DR Amil Amen or DR Chalmers Cater - Recommend going back to gym and working out  #FOllowup  - as needed

## 2014-07-20 DIAGNOSIS — M25562 Pain in left knee: Secondary | ICD-10-CM | POA: Diagnosis not present

## 2014-07-20 DIAGNOSIS — M25561 Pain in right knee: Secondary | ICD-10-CM | POA: Diagnosis not present

## 2014-07-20 DIAGNOSIS — R918 Other nonspecific abnormal finding of lung field: Secondary | ICD-10-CM | POA: Diagnosis not present

## 2014-07-20 DIAGNOSIS — M2550 Pain in unspecified joint: Secondary | ICD-10-CM | POA: Diagnosis not present

## 2014-08-09 ENCOUNTER — Other Ambulatory Visit: Payer: Self-pay | Admitting: Gastroenterology

## 2014-08-11 ENCOUNTER — Telehealth: Payer: Self-pay | Admitting: Gastroenterology

## 2014-08-11 MED ORDER — LANSOPRAZOLE 30 MG PO CPDR: 30.0000 mg | DELAYED_RELEASE_CAPSULE | Freq: Two times a day (BID) | ORAL | Status: DC

## 2014-08-11 NOTE — Telephone Encounter (Signed)
Patient aware med sent in  

## 2014-08-27 ENCOUNTER — Other Ambulatory Visit: Payer: Self-pay | Admitting: Cardiology

## 2014-09-19 ENCOUNTER — Encounter: Payer: Self-pay | Admitting: Family Medicine

## 2014-09-19 ENCOUNTER — Ambulatory Visit (INDEPENDENT_AMBULATORY_CARE_PROVIDER_SITE_OTHER): Payer: Medicare Other | Admitting: Family Medicine

## 2014-09-19 ENCOUNTER — Ambulatory Visit (INDEPENDENT_AMBULATORY_CARE_PROVIDER_SITE_OTHER): Payer: Medicare Other

## 2014-09-19 VITALS — BP 110/72 | HR 88 | Temp 97.7°F | Ht 69.0 in | Wt 186.6 lb

## 2014-09-19 DIAGNOSIS — R05 Cough: Secondary | ICD-10-CM

## 2014-09-19 DIAGNOSIS — M940 Chondrocostal junction syndrome [Tietze]: Secondary | ICD-10-CM

## 2014-09-19 DIAGNOSIS — R059 Cough, unspecified: Secondary | ICD-10-CM

## 2014-09-19 DIAGNOSIS — R0789 Other chest pain: Secondary | ICD-10-CM

## 2014-09-19 MED ORDER — PREDNISONE 10 MG PO TABS: ORAL_TABLET | ORAL | Status: DC

## 2014-09-19 NOTE — Progress Notes (Signed)
Subjective:  Patient ID: Richard Franco, male    DOB: Mar 31, 1966  Age: 49 y.o. MRN: [id number]  CC: Chest Pain   HPI Richard Franco presents for Tightness central chest 2 weeks steady. Not episodic. It does not radiate. It is not related to activities. It is not associated with nausea or diaphoresis. It is located in the central chest rather than the precordium without radiation. Had "Ground Glass"  in past. Hx of MI. Cardiology notes reviewed from time of MI. Also previous note regarding the groundglass appearance of his chest x-ray. Pt. States that pain was far different from this.  Currently 2-3 days dry cough. Chest tightness awakens him at night sometimes. NKI. No fever chills or sweats. Mild exertional shortness of breath.  History Richard Franco has a past medical history of Migraine headache; Arthritis; Fibromyalgia; Depression; GERD (gastroesophageal reflux disease); Testosterone deficiency; Anxiety; Complication of anesthesia; Abnormal chest CT (03/2012); NSTEMI (non-ST elevated myocardial infarction); and Tobacco abuse.   He has past surgical history that includes Posterior cervical fusion/foraminotomy (05/09/2011); Cystectomy; Cardiac catheterization (03/25/2012); Ankle Fusion; Video bronchoscopy (05/06/2012); Fracture surgery (2006); left heart catheterization with coronary angiogram (N/A, 03/25/2012); and left heart catheterization with coronary angiogram (N/A, 08/11/2012).   His family history includes Asthma in his sister; Coronary artery disease in his paternal uncle; Diabetes in his mother.He reports that he quit smoking about 2 years ago. His smoking use included Cigarettes. He has a 27 pack-year smoking history. He has quit using smokeless tobacco. His smokeless tobacco use included Chew. He reports that he does not drink alcohol or use illicit drugs.  Current Outpatient Prescriptions on File Prior to Visit  Medication Sig Dispense Refill  . amLODipine (NORVASC) 5 MG tablet TAKE ONE  TABLET BY MOUTH ONCE DAILY 30 tablet 6  . aspirin EC 81 MG EC tablet Take 1 tablet (81 mg total) by mouth daily.    . busPIRone (BUSPAR) 10 MG tablet Take 10 mg by mouth 2 (two) times daily.    Marland Kitchen FLUoxetine (PROZAC) 20 MG capsule Take 60 mg by mouth daily.    Marland Kitchen lamoTRIgine (LAMICTAL) 100 MG tablet Take 100 mg by mouth 2 (two) times daily.    . lansoprazole (PREVACID) 30 MG capsule Take 1 capsule (30 mg total) by mouth 2 (two) times daily. 60 capsule 2  . nitroGLYCERIN (NITROSTAT) 0.4 MG SL tablet Place 1 tablet (0.4 mg total) under the tongue every 5 (five) minutes as needed for chest pain (up to 3 doses). 25 tablet 0  . oxyCODONE-acetaminophen (PERCOCET) 5-325 MG per tablet Take 1-2 tablets by mouth every 4 (four) hours as needed for moderate pain or severe pain. As needed for pain.    . Testosterone (ANDROGEL PUMP) 20.25 MG/ACT (1.62%) GEL Place 1 application onto the skin 2 (two) times daily. 2 pumps daily     No current facility-administered medications on file prior to visit.    ROS Review of Systems  Constitutional: Negative for fever, chills, diaphoresis and unexpected weight change.  HENT: Negative for congestion, hearing loss, rhinorrhea, sore throat and trouble swallowing.   Respiratory: Positive for cough, chest tightness and shortness of breath. Negative for choking, wheezing and stridor.   Cardiovascular: Positive for chest pain.  Gastrointestinal: Negative for nausea, vomiting, abdominal pain, diarrhea, constipation and abdominal distention.  Endocrine: Negative for cold intolerance and heat intolerance.  Genitourinary: Negative for dysuria, hematuria and flank pain.  Musculoskeletal: Negative for joint swelling and arthralgias.  Skin: Negative for rash.  Neurological:  Negative for dizziness and headaches.  Psychiatric/Behavioral: Negative for dysphoric mood, decreased concentration and agitation. The patient is not nervous/anxious.     Objective:  BP 110/72 mmHg  Pulse 88   Temp(Src) 97.7 F (36.5 C) (Oral)  Ht 5\' 9"  (1.753 m)  Wt 186 lb 9.6 oz (84.641 kg)  BMI 27.54 kg/m2  BP Readings from Last 3 Encounters:  09/19/14 110/72  07/19/14 116/72  06/14/14 128/78    Wt Readings from Last 3 Encounters:  09/19/14 186 lb 9.6 oz (84.641 kg)  07/19/14 183 lb (83.008 kg)  06/14/14 182 lb (82.555 kg)     Physical Exam  Constitutional: He is oriented to person, place, and time. He appears well-developed and well-nourished. No distress.  HENT:  Head: Normocephalic and atraumatic.  Right Ear: External ear normal.  Left Ear: External ear normal.  Nose: Nose normal.  Mouth/Throat: Oropharynx is clear and moist.  Eyes: Conjunctivae and EOM are normal. Pupils are equal, round, and reactive to light.  Neck: Normal range of motion. Neck supple. No thyromegaly present.  Cardiovascular: Normal rate, regular rhythm and normal heart sounds.   No murmur heard. Pulmonary/Chest: Effort normal and breath sounds normal. No respiratory distress. He has no wheezes. He has no rales.  Abdominal: Soft. Bowel sounds are normal. He exhibits no distension. There is no tenderness.  Lymphadenopathy:    He has no cervical adenopathy.  Neurological: He is alert and oriented to person, place, and time. He has normal reflexes.  Skin: Skin is warm and dry.  Psychiatric: He has a normal mood and affect. His behavior is normal. Judgment and thought content normal.    Lab Results  Component Value Date   HGBA1C 5.7* 08/11/2012   HGBA1C  08/19/2009    5.1 (NOTE) The ADA recommends the following therapeutic goal for glycemic control related to Hgb A1c measurement: Goal of therapy: <6.5 Hgb A1c  Reference: American Diabetes Association: Clinical Practice Recommendations 2010, Diabetes Care, 2010, 33: (Suppl  1).      Ct Chest Wo Contrast  06/06/2014   CLINICAL DATA:  49 year old male with history of pulmonary nodule noted on prior scan. Follow-up followup evaluation.  EXAM: CT  CHEST WITHOUT CONTRAST  TECHNIQUE: Multidetector CT imaging of the chest was performed following the standard protocol without IV contrast.  COMPARISON:  Chest CT 06/04/2013.  FINDINGS: Mediastinum: Heart size is normal. There is no significant pericardial fluid, thickening or pericardial calcification. No pathologically enlarged mediastinal or hilar lymph nodes. Please note that accurate exclusion of hilar adenopathy is limited on noncontrast CT scans. Esophagus is unremarkable in appearance.  Lungs/Pleura: Again noted are multiple small pulmonary nodules, which appear unchanged in size, number and distribution compared to prior study 06/04/2013, most of which are 4 mm or less in size. The largest nodule measuring 5 mm (lateral segment of the right middle lobe on image 40 of series 3) is unchanged compared to prior study from 06/22/2012. No new suspicious appearing pulmonary nodules or masses are otherwise noted. No acute consolidative airspace disease. No pleural effusions.  Upper Abdomen: 2 cm exophytic low-attenuation lesion in the posterior aspect of the interpolar region of the left kidney is unchanged, and although incompletely characterized on today's noncontrast study, likely represents a cyst.  Musculoskeletal: There are no aggressive appearing lytic or blastic lesions noted in the visualized portions of the skeleton. Orthopedic fixation hardware in the lower cervical spine incompletely visualized.  IMPRESSION: 1. No change in multiple tiny pulmonary nodules scattered throughout the  lungs bilaterally. The majority of these have been stable for 2 years, and the other smaller lesions (less and 4 mm in size) have been stable for 1 year. At this time, these can all be considered benign and do not require further imaging followup. 2. Additional incidental findings, as above.   Electronically Signed   By: Vinnie Langton M.D.   On: 06/06/2014 13:26    Assessment & Plan:   Camry was seen today for chest  pain.  Diagnoses and all orders for this visit:  Cough Orders: -     DG Chest 2 View  Chest tightness Orders: -     DG Chest 2 View  Costochondritis  Other orders -     predniSONE (DELTASONE) 10 MG tablet; Take 5 daily for 3 days followed by 4,3,2 and 1 for 3 days each.   I am having Mr. Wenke start on predniSONE. I am also having him maintain his FLUoxetine, lamoTRIgine, busPIRone, oxyCODONE-acetaminophen, aspirin, Testosterone, nitroGLYCERIN, lansoprazole, and amLODipine.  Meds ordered this encounter  Medications  . predniSONE (DELTASONE) 10 MG tablet    Sig: Take 5 daily for 3 days followed by 4,3,2 and 1 for 3 days each.    Dispense:  45 tablet    Refill:  0   Normal chest xray- no masses , no infiltrates, no effusions or edema heart not enlarged- Preliminary reading by Claretta Fraise, MD  Follow-up: Return in about 6 months (around 03/22/2015), or if symptoms worsen or fail to improve.  Claretta Fraise, M.D.

## 2014-09-23 ENCOUNTER — Ambulatory Visit (INDEPENDENT_AMBULATORY_CARE_PROVIDER_SITE_OTHER): Payer: Medicare Other | Admitting: Gastroenterology

## 2014-09-23 ENCOUNTER — Encounter: Payer: Self-pay | Admitting: Gastroenterology

## 2014-09-23 VITALS — BP 122/78 | HR 84 | Ht 69.0 in | Wt 186.4 lb

## 2014-09-23 DIAGNOSIS — K219 Gastro-esophageal reflux disease without esophagitis: Secondary | ICD-10-CM

## 2014-09-23 DIAGNOSIS — R1310 Dysphagia, unspecified: Secondary | ICD-10-CM | POA: Diagnosis not present

## 2014-09-23 MED ORDER — PANTOPRAZOLE SODIUM 40 MG PO TBEC: 40.0000 mg | DELAYED_RELEASE_TABLET | Freq: Two times a day (BID) | ORAL | Status: DC

## 2014-09-23 NOTE — Assessment & Plan Note (Addendum)
It is difficult to sort out the etiologies for the patient's chest pain.  Reflux may certainly be contributing.  Also note he has upper abdominal burning discomfort as well and he reports relief with antacids.    Recommendations #1 trial of Protonix 40 mg before breakfast and dinner; if no better may consider repeat endoscopy #2 antireflux measures

## 2014-09-23 NOTE — Progress Notes (Signed)
      History of Present Illness:  Mr. Orvis has returned for evaluation and follow-up of chest pain.  Since his last visit he suffered a non-STEMI MI.  He was also placed on prednisone for pericarditis.  He has frequent burning chest discomfort and upper abdominal burning immediately after drinking.  He takes Rolaids with relief.  He is unsure what the etiology of his chest pain is.  He often has to take liquids after swallowing solids which he attributes to problems with swallowing following neck surgery.  He continues on Prevacid.  Symptoms preceded prednisone therapy.    Review of Systems: Pertinent positive and negative review of systems were noted in the above HPI section. All other review of systems were otherwise negative.    Current Medications, Allergies, Past Medical History, Past Surgical History, Family History and Social History were reviewed in [facility] record  Vital signs were reviewed in today's medical record. Physical Exam: General: Well developed , well nourished, no acute distress Skin: anicteric Head: Normocephalic and atraumatic Eyes:  sclerae anicteric, EOMI Ears: Normal auditory acuity Mouth: No deformity or lesions Lungs: Clear throughout to auscultation Heart: Regular rate and rhythm; no murmurs, rubs or bruits Abdomen: Soft, non tender and non distended. No masses, hepatosplenomegaly or hernias noted. Normal Bowel sounds Rectal:deferred Musculoskeletal: Symmetrical with no gross deformities  Pulses:  Normal pulses noted Extremities: No clubbing, cyanosis, edema or deformities noted Neurological: Alert oriented x 4, grossly nonfocal Psychological:  Alert and cooperative. Normal mood and affect  See Assessment and Plan under Problem List

## 2014-09-23 NOTE — Assessment & Plan Note (Signed)
Patient has a history of dysphagia without clear stricture.  He attributes this to neck surgery which may be if operative.  Since symptoms are minimal and well controlled would not pursue further.

## 2014-09-23 NOTE — Patient Instructions (Signed)
Stop taking Prevacid We will send a new prescription to your pharmacy (Pantoprazole)

## 2014-09-29 DIAGNOSIS — E221 Hyperprolactinemia: Secondary | ICD-10-CM | POA: Diagnosis not present

## 2014-09-29 DIAGNOSIS — E0590 Thyrotoxicosis, unspecified without thyrotoxic crisis or storm: Secondary | ICD-10-CM | POA: Diagnosis not present

## 2014-09-29 DIAGNOSIS — E291 Testicular hypofunction: Secondary | ICD-10-CM | POA: Diagnosis not present

## 2014-10-04 ENCOUNTER — Other Ambulatory Visit: Payer: Self-pay | Admitting: Internal Medicine

## 2014-10-04 DIAGNOSIS — E221 Hyperprolactinemia: Secondary | ICD-10-CM

## 2014-10-05 DIAGNOSIS — R6889 Other general symptoms and signs: Secondary | ICD-10-CM | POA: Diagnosis not present

## 2014-10-17 ENCOUNTER — Ambulatory Visit
Admission: RE | Admit: 2014-10-17 | Discharge: 2014-10-17 | Disposition: A | Discharge status: Home / Self Care | Payer: Medicare Other | Source: Ambulatory Visit | Attending: Internal Medicine | Admitting: Internal Medicine

## 2014-10-17 DIAGNOSIS — E221 Hyperprolactinemia: Secondary | ICD-10-CM | POA: Diagnosis not present

## 2014-10-17 MED ORDER — GADOBENATE DIMEGLUMINE 529 MG/ML IV SOLN
8.0000 mL | Freq: Once | INTRAVENOUS | Status: AC | PRN
  Administered 2014-10-17: 8 mL via INTRAVENOUS

## 2014-11-03 ENCOUNTER — Other Ambulatory Visit: Payer: Self-pay | Admitting: Cardiology

## 2014-11-17 ENCOUNTER — Other Ambulatory Visit: Payer: Self-pay | Admitting: Gastroenterology

## 2014-12-22 ENCOUNTER — Other Ambulatory Visit: Payer: Self-pay | Admitting: *Deleted

## 2014-12-22 ENCOUNTER — Other Ambulatory Visit: Payer: Self-pay | Admitting: Gastroenterology

## 2015-01-13 DIAGNOSIS — J029 Acute pharyngitis, unspecified: Secondary | ICD-10-CM | POA: Diagnosis not present

## 2015-01-13 DIAGNOSIS — Z79899 Other long term (current) drug therapy: Secondary | ICD-10-CM | POA: Diagnosis not present

## 2015-01-13 DIAGNOSIS — M542 Cervicalgia: Secondary | ICD-10-CM | POA: Diagnosis not present

## 2015-01-13 DIAGNOSIS — R221 Localized swelling, mass and lump, neck: Secondary | ICD-10-CM | POA: Diagnosis not present

## 2015-01-16 ENCOUNTER — Ambulatory Visit (INDEPENDENT_AMBULATORY_CARE_PROVIDER_SITE_OTHER): Payer: Medicare Other | Admitting: Nurse Practitioner

## 2015-01-16 ENCOUNTER — Encounter: Payer: Self-pay | Admitting: Nurse Practitioner

## 2015-01-16 VITALS — BP 125/84 | HR 105 | Temp 98.0°F | Ht 69.0 in | Wt 195.0 lb

## 2015-01-16 DIAGNOSIS — R070 Pain in throat: Secondary | ICD-10-CM | POA: Diagnosis not present

## 2015-01-16 NOTE — Progress Notes (Signed)
   Subjective:    Patient ID: Richard Franco, male    DOB: [birth year], 49 y.o.   MRN: [id number]  HPI  Richard Franco [facility] to ER with adams apple pain- they did ct scan of neck which was negative. They discharged him on levaquin,motrin,prednisone and hydrocodone. He says it is still hurtis- painful to swallow.   Review of Systems  Constitutional: Negative.   HENT: Negative.   Respiratory: Negative.   Cardiovascular: Negative.   Gastrointestinal: Negative.   Genitourinary: Negative.   Neurological: Negative.   Psychiatric/Behavioral: Negative.   All other systems reviewed and are negative.      Objective:   Physical Exam  Constitutional: He is oriented to person, place, and time. He appears well-developed and well-nourished.  HENT:  Right Ear: External ear normal.  Left Ear: External ear normal.  Nose: Nose normal.  Mouth/Throat: Oropharynx is clear and moist.  Pain along lower portion of adams apple on palpation  Neck: Normal range of motion. Neck supple.  Cardiovascular: Normal rate, regular rhythm and normal heart sounds.   Pulmonary/Chest: Effort normal and breath sounds normal.  Lymphadenopathy:    He has no cervical adenopathy.  Neurological: He is alert and oriented to person, place, and time.  Skin: Skin is warm.  Psychiatric: He has a normal mood and affect. His behavior is normal. Judgment and thought content normal.    BP 125/84 mmHg  Pulse 105  Temp(Src) 98 F (36.7 C) (Oral)  Ht 5\' 9"  (1.753 m)  Wt 195 lb (88.451 kg)  BMI 28.78 kg/m2       Assessment & Plan:  1. Throat pain Continue all meds rx from [facility] records reviewed - Ambulatory referral to ENT  [place], FNP

## 2015-01-17 ENCOUNTER — Other Ambulatory Visit: Payer: Self-pay | Admitting: Cardiology

## 2015-01-19 DIAGNOSIS — R070 Pain in throat: Secondary | ICD-10-CM | POA: Diagnosis not present

## 2015-01-19 DIAGNOSIS — K219 Gastro-esophageal reflux disease without esophagitis: Secondary | ICD-10-CM | POA: Diagnosis not present

## 2015-01-19 DIAGNOSIS — H6123 Impacted cerumen, bilateral: Secondary | ICD-10-CM | POA: Diagnosis not present

## 2015-01-24 ENCOUNTER — Other Ambulatory Visit: Payer: Self-pay | Admitting: Gastroenterology

## 2015-03-03 ENCOUNTER — Other Ambulatory Visit: Payer: Self-pay | Admitting: Gastroenterology

## 2015-03-10 ENCOUNTER — Telehealth: Payer: Self-pay | Admitting: Internal Medicine

## 2015-03-10 MED ORDER — LANSOPRAZOLE 30 MG PO CPDR: 30.0000 mg | DELAYED_RELEASE_CAPSULE | Freq: Two times a day (BID) | ORAL | Status: DC

## 2015-03-10 NOTE — Telephone Encounter (Signed)
I have spoken to patient who states that he went back to prevacid and is doing well on it. I will send prevacid rx

## 2015-03-22 ENCOUNTER — Encounter: Payer: Self-pay | Admitting: Nurse Practitioner

## 2015-03-22 ENCOUNTER — Ambulatory Visit (INDEPENDENT_AMBULATORY_CARE_PROVIDER_SITE_OTHER): Payer: Medicare Other | Admitting: Nurse Practitioner

## 2015-03-22 VITALS — BP 128/86 | HR 87 | Ht 69.0 in | Wt 188.8 lb

## 2015-03-22 DIAGNOSIS — R0789 Other chest pain: Secondary | ICD-10-CM

## 2015-03-22 LAB — CBC
HCT: 46.3 % (ref 39.0–52.0)
Hemoglobin: 15.6 g/dL (ref 13.0–17.0)
MCHC: 33.7 g/dL (ref 30.0–36.0)
MCV: 90.7 fl (ref 78.0–100.0)
Platelets: 205 10*3/uL (ref 150.0–400.0)
RBC: 5.1 Mil/uL (ref 4.22–5.81)
RDW: 13.7 % (ref 11.5–15.5)
WBC: 5.9 10*3/uL (ref 4.0–10.5)

## 2015-03-22 LAB — TROPONIN I: TNIDX: 0.01 ug/l (ref 0.00–0.06)

## 2015-03-22 LAB — BASIC METABOLIC PANEL
BUN: 10 mg/dL (ref 6–23)
CO2: 32 mEq/L (ref 19–32)
Calcium: 9.8 mg/dL (ref 8.4–10.5)
Chloride: 102 mEq/L (ref 96–112)
Creatinine, Ser: 1.17 mg/dL (ref 0.40–1.50)
GFR: 70.57 mL/min (ref >60.00)
Glucose, Bld: 98 mg/dL (ref 70–99)
Potassium: 5.1 mEq/L (ref 3.5–5.1)
Sodium: 139 mEq/L (ref 135–145)

## 2015-03-22 LAB — HEPATIC FUNCTION PANEL
ALT: 21 U/L (ref 0–53)
AST: 24 U/L (ref 0–37)
Albumin: 4.6 g/dL (ref 3.5–5.2)
Alkaline Phosphatase: 64 U/L (ref 39–117)
Bilirubin, Direct: 0.1 mg/dL (ref 0.0–0.3)
Total Bilirubin: 0.5 mg/dL (ref 0.2–1.2)
Total Protein: 7.2 g/dL (ref 6.0–8.3)

## 2015-03-22 LAB — LIPID PANEL
Cholesterol: 202 mg/dL — ABNORMAL HIGH (ref 0–200)
HDL: 58.8 mg/dL (ref >39.00)
LDL Cholesterol: 120 mg/dL — ABNORMAL HIGH (ref 0–99)
NonHDL: 143.61
Total CHOL/HDL Ratio: 3
Triglycerides: 116 mg/dL (ref 0.0–149.0)
VLDL: 23.2 mg/dL (ref 0.0–40.0)

## 2015-03-22 MED ORDER — METOPROLOL TARTRATE 25 MG PO TABS: 12.5000 mg | ORAL_TABLET | Freq: Two times a day (BID) | ORAL | Status: DC

## 2015-03-22 MED ORDER — NITROGLYCERIN 0.4 MG SL SUBL: SUBLINGUAL_TABLET | SUBLINGUAL | Status: DC

## 2015-03-22 NOTE — Progress Notes (Signed)
CARDIOLOGY OFFICE NOTE  Date:  03/22/2015    Richard Franco Date of Birth: Dec 20, 1965 Medical Record #[id number]  PCP:  Redge Gainer, MD  Cardiologist:  Aundra Dubin    Chief Complaint  Patient presents with  . Chest Pain    Work in visit - seen for Dr. Aundra Dubin    History of Present Illness: Richard Franco is a 49 y.o. male who presents today for a follow up visit. Seen for Dr. Aundra Dubin. He has a history of chest pain and ground glass infiltrates on lung imaging was initially admitted in the past with pleuritic chest pain and exertional dyspnea in 10/13. He ended up having a left heart cath, which showed no significant coronary disease. Echo was normal-appearing. CTA of the chest, however, showed diffuse ground glass infiltrates. Patient was treated with steroids and has been followed by pulmonary. The steroids significantly improved his symptoms. He had a transbronchial biopsy with no definite etiology lung findings noted, possible organizing pneumonia. He was actually referred to pulmonary at [facility]. He says they just told him that whatever process it was that he had seemed to have resolved. He was readmitted in 2/14 with chest pain and had troponin elevation to 4.42. LHC was done again, again demonstrating normal coronaries. Due to concern for coronary vasospasm, he was started on Imdur but was unable to tolerate due to headache so diltiazem CD was begun and titrated up to 360 mg daily, this was transitioned eventually to amlodipine. He also had polyarthralgias and was referred to rheumatology. Autoimmune workup was negative. He was seen by pulmonary and had a CPX, suggesting deconditioning as a cause for his exertional dyspnea.   Last seen back in June of 2015. Rare chest pain noted at that time.   He golfs about 2 times a week. But no other real exercise. Smoking "a little bit" - does not quantify. Just started back smoking about a month ago - does not really know why.    Comes in today. Here alone. He has not been here in almost a year and a half. Had been doing ok up until about 4 to 5 months ago. Now notes more chest discomfort - described as sharp or tight - with and without exertion. Varies in duration. Back smoking. No cough. Some palpitations. Feels his heart beating fast. He does not exercise regularly. Using probably more NTG - this does help - no real headache with sl NTG. No recent labs  Past Medical History:  1. Headache  2. Osteoarthritis  3. Depression  4. Chronic cervical spine disease, status post multiple surgeries with chronic pain  5. Insomnia, late onset of sleep, and he sleeps until late in the morning  6. Tobacco abuse: Quit 10/13.  7. GERD  8. Chest pain: ETT-myoview (5/07): No ischemia or infarction. ETT (3/11) normal. LHC (10/13): EF 60%, normal coronary arteries. Admit with CP in 2/14, troponin 4.4. LHC (2/14) with normal coronaries. Possible coronary vasospasm, improved with calcium channel blockers.  9. Echo (10/13) with EF 55-60%, normal RV, normal valves.  10. Ground glass lung infiltrates: Admitted with pleuritic chest pain in 10/13. CTA chest showed multiple areas of ground glass opacity. Transbronchial biopsy unrevealing. Improved with steroids. Ddx = infection versus eosinophilic PNA versus organizing PNA versus pneumoconiosis.  11. Palpitations 12. Hyperlipidemia 13. Polyarthralgia of uncertain etiology: Negative workup by rheumatology.  14. CPX (9/14): peak VO2 25.6 (76% predicted), VE/VCO2 31.2, RER 1.28, dyspnea thought due to deconditioning.  Past Medical History  Diagnosis Date  . Migraine headache     pt takes inderal for headaches  . Arthritis     s/p neck surgery  . Fibromyalgia   . Depression   . GERD (gastroesophageal reflux disease)   . Testosterone deficiency   . Anxiety   . Complication of anesthesia     " difficult to urinate after "  . Abnormal chest CT 03/2012    a. Scattered  patchy ground glass opacities and pulmonary nodules. b. F/u CT angio 07/2012: scattered stable bilat pulm nodules.  . NSTEMI (non-ST elevated myocardial infarction)     a. Normal cath 03/2012. b. 07/2012: troponin 4, normal cors, ?vasospasm.  . Tobacco abuse     Past Surgical History  Procedure Laterality Date  . Posterior cervical fusion/foraminotomy  05/09/2011    Procedure: POSTERIOR CERVICAL FUSION/FORAMINOTOMY LEVEL 1;  Surgeon: Hosie Spangle;  Location: [facility];  Service: Neurosurgery;  Laterality: N/A;  C4/5 posterior arthrodesis with instrumentation   . Cystectomy    . Cardiac catheterization  03/25/2012  . Ankle fusion    . Video bronchoscopy  05/06/2012    Procedure: VIDEO BRONCHOSCOPY WITH FLUORO;  Surgeon: Jyl Heinz, MD;  Location: [facility] ENDOSCOPY;  Service: Cardiopulmonary;  Laterality: Bilateral;  . Fracture surgery  2006    left ankle  . Left heart catheterization with coronary angiogram N/A 03/25/2012    Procedure: LEFT HEART CATHETERIZATION WITH CORONARY ANGIOGRAM;  Surgeon: Burnell Blanks, MD;  Location: [facility] CATH LAB;  Service: Cardiovascular;  Laterality: N/A;  . Left heart catheterization with coronary angiogram N/A 08/11/2012    Procedure: LEFT HEART CATHETERIZATION WITH CORONARY ANGIOGRAM;  Surgeon: Peter M Martinique, MD;  Location: [facility] CATH LAB;  Service: Cardiovascular;  Laterality: N/A;     Medications: Current Outpatient Prescriptions  Medication Sig Dispense Refill  . amLODipine (NORVASC) 5 MG tablet TAKE ONE TABLET BY MOUTH ONCE DAILY 30 tablet 6  . aspirin EC 81 MG EC tablet Take 1 tablet (81 mg total) by mouth daily.    . busPIRone (BUSPAR) 10 MG tablet Take 10 mg by mouth 2 (two) times daily.    Marland Kitchen FLUoxetine (PROZAC) 20 MG capsule Take 40 mg by mouth daily.     Marland Kitchen HYDROcodone-acetaminophen (NORCO/VICODIN) 5-325 MG tablet TK 1 T PO  Q 6 H PRN P for headaches  0  . ibuprofen (ADVIL,MOTRIN) 200 MG tablet Take 200 mg by mouth every 6 (six) hours as  needed for headache.    . lamoTRIgine (LAMICTAL) 100 MG tablet Take 100 mg by mouth 2 (two) times daily.    . lansoprazole (PREVACID) 30 MG capsule Take 1 capsule (30 mg total) by mouth 2 (two) times daily. 60 capsule 2  . nitroGLYCERIN (NITROSTAT) 0.4 MG SL tablet DISSOLVE ONE TABLET UNDER THE TONGUE EVERY 5 MINUTES AS NEEDED FOR CHEST PAIN.  DO NOT EXCEED A TOTAL OF 3 DOSES IN 15 MINUTES 25 tablet 3  . metoprolol tartrate (LOPRESSOR) 25 MG tablet Take 0.5 tablets (12.5 mg total) by mouth 2 (two) times daily. 30 tablet 3   No current facility-administered medications for this visit.    Allergies: Allergies  Allergen Reactions  . Midodrine Hcl Swelling    Tongue swelling  . Sulfonamide Derivatives Rash         Social History: The patient  reports that he quit smoking about 2 years ago. His smoking use included Cigarettes. He has a 27 pack-year smoking history. He has  quit using smokeless tobacco. His smokeless tobacco use included Chew. He reports that he does not drink alcohol or use illicit drugs.   Family History: The patient's family history includes Asthma in his sister; Coronary artery disease in his paternal uncle; Diabetes in his mother.   Review of Systems: Please see the history of present illness.   Otherwise, the review of systems is positive for none.   All other systems are reviewed and negative.   Physical Exam: VS:  BP 128/86 mmHg  Pulse 87  Ht 5\' 9"  (1.753 m)  Wt 188 lb 12.8 oz (85.639 kg)  BMI 27.87 kg/m2 .  BMI Body mass index is 27.87 kg/(m^2).  Wt Readings from Last 3 Encounters:  03/22/15 188 lb 12.8 oz (85.639 kg)  01/16/15 195 lb (88.451 kg)  09/23/14 186 lb 6 oz (84.539 kg)    General: Pleasant. Well developed, well nourished and in no acute distress.  HEENT: Normal. Neck: Supple, no JVD, carotid bruits, or masses noted.  Cardiac: Regular rate and rhythm. No murmurs, rubs, or gallops. No edema.  Respiratory:  Lungs are clear to auscultation  bilaterally with normal work of breathing.  GI: Soft and nontender.  MS: No deformity or atrophy. Gait and ROM intact. Skin: Warm and dry. Color is normal.  Neuro:  Strength and sensation are intact and no gross focal deficits noted.  Psych: Alert, appropriate and with normal affect.   LABORATORY DATA:  EKG:  EKG is ordered today. This demonstrates NSR with no acute changes.  Lab Results  Component Value Date   WBC 4.7 04/30/2013   HGB 14.5 04/30/2013   HCT 40.7 04/30/2013   PLT 188 04/30/2013   GLUCOSE 99 06/14/2014   CHOL 157 11/26/2012   TRIG 99.0 11/26/2012   HDL 35.80* 11/26/2012   LDLCALC 101* 11/26/2012   ALT 25 06/14/2014   AST 20 06/14/2014   NA 141 06/14/2014   K 4.4 06/14/2014   CL 100 06/14/2014   CREATININE 1.26 06/14/2014   BUN 8 06/14/2014   CO2 25 06/14/2014   TSH 1.074 Test methodology is 3rd generation TSH 08/18/2009   INR 0.99 08/10/2012   HGBA1C 5.7* 08/11/2012    BNP (last 3 results) No results for input(s): BNP in the last 8760 hours.  ProBNP (last 3 results) No results for input(s): PROBNP in the last 8760 hours.   Other Studies Reviewed Today:  CPX Conclusion from 02/2013: Exercise testing with gas exchange demonstrates a mild functional limitation when compared to matched sedentary norms. The patient's tidal volume during exercise appears to be a limiting factor, couple with the low respiratory rate. The elevated tidal volume during exercise can induce a sensation of not being able to get a full breath and may cause chest discomfort. There also appears to be significant deconditioning. ^^^Preliminary CPX Results, Finalized results will be forwarded when completed by interpreting physician.^^^  Prepared by: Linzie Collin, MEd, ACSM-RCEP Sr. Exercise Physiologist 02/23/2013 2:52 PM  Echo Study Conclusions from 03/2012  - Left ventricle: The cavity size was normal. Wall thickness was normal. Systolic function was normal. The  estimated ejection fraction was in the range of 55% to 60%. Wall motion was normal; there were no regional wall motion abnormalities. Doppler parameters are consistent with abnormal left ventricular relaxation (grade 1 diastolic dysfunction). - Aortic valve: There was no stenosis. - Mitral valve: No significant regurgitation. - Right ventricle: The cavity size was normal. Systolic function was normal. - Pulmonary arteries: No complete  TR doppler jet so unable to estimate PA systolic pressure. - Inferior vena cava: The vessel was normal in size; the respirophasic diameter changes were in the normal range (= 50%); findings are consistent with normal central venous pressure. - Pericardium, extracardiac: There was no pericardial effusion. Impressions:  - Normal LV size and systolic function, EF 47-65%. Normal RV size and systolic function. No significant valvular abnormalities. No pericardial effusion.  Coronary angiography from 07/2012: Coronary dominance: left  Left mainstem: Short, normal.  Left anterior descending (LAD): Normal.  Left circumflex (LCx): Dominant, normal.  Right coronary artery (RCA): small, nondominant, normal.  Left ventriculography: Left ventricular systolic function is normal, LVEF is estimated at 55-65%, there is no significant mitral regurgitation   Final Conclusions:  1. Normal coronary anatomy. 2. Normal LV function.  Recommendations: treat empirically for spasm.  [facility] 08/11/2012, 1:52 PM  Assessment/Plan: 1. Coronary vasospasm: Based on past symptoms and past elevated troponin with normal coronaries on cath, suspect coronary artery vasospasm. He initially improved on calcium channel blocker (amlodipine). He is unable to tolerate Imdur but probably did not give it enough time. Will try low dose Lopressor - he may be agreeable to rechallenge on Imdur. He has to stop smoking.    2. Palpitations: Suspect  premature beats. Would cut back on the caffeine. Stop smoking  3. Ground glass infiltrates: Undifferentiated lung disease. Possible organizing pneumonia He has improved with steroids. He has been released from pulmonary. Transbronchial biopsy was not revealing. Seems to have resolved spontaneously. Last CT was ok  4. Hyperlipidemia: no recent labs  5. Myalgias/polyarthralgias: Negative past evaluation by rheumatology.   6. Fatigue: This is chronic. I hope will not be worse with low dose beta blocker. Still suspect deconditioning (supported by CPX in 9/14). Suggested again that he start exercising, would like to see him walk 4-5 times/week.  Current medicines are reviewed with the patient today.  The patient does not have concerns regarding medicines other than what has been noted above.  The following changes have been made:  See above.  Labs/ tests ordered today include:    Orders Placed This Encounter  Procedures  . Basic metabolic panel  . CBC  . Troponin I  . Hepatic function panel  . Lipid panel  . EKG 12-Lead     Disposition:   FU with me in 1 month.   Patient is agreeable to this plan and will call if any problems develop in the interim.   Signed: Burtis Junes, RN, ANP-C 03/22/2015 12:07 PM  [facility] [address] [place], [place]  [address] Phone: [phone] Fax: [phone]

## 2015-03-22 NOTE — Patient Instructions (Addendum)
We will be checking the following labs today - stat Troponin, BMET, CBC, HPF and lipids   Medication Instructions:    Continue with your current medicines.   We will try adding Lopressor 25 mg to take just 1/2 a pill twice a day  I have refilled the NTG    Testing/Procedures To Be Arranged:  N/A  Follow-Up:   See me in one month    Other Special Instructions:   STOP SMOKING!  Call the [place] office at [phone] if you have any questions, problems or concerns.

## 2015-04-17 ENCOUNTER — Ambulatory Visit: Payer: Medicare Other

## 2015-04-17 ENCOUNTER — Ambulatory Visit: Payer: Medicare Other | Admitting: Nurse Practitioner

## 2015-04-17 ENCOUNTER — Other Ambulatory Visit: Payer: Self-pay | Admitting: Cardiology

## 2015-04-18 ENCOUNTER — Other Ambulatory Visit: Payer: Self-pay

## 2015-04-18 MED ORDER — AMLODIPINE BESYLATE 5 MG PO TABS: 5.0000 mg | ORAL_TABLET | Freq: Every day | ORAL | Status: DC

## 2015-05-08 ENCOUNTER — Encounter: Payer: Self-pay | Admitting: Family

## 2015-05-08 ENCOUNTER — Ambulatory Visit (INDEPENDENT_AMBULATORY_CARE_PROVIDER_SITE_OTHER): Payer: Medicare Other | Admitting: Family

## 2015-05-08 VITALS — BP 123/79 | HR 105 | Temp 97.0°F | Ht 69.0 in | Wt 186.2 lb

## 2015-05-08 DIAGNOSIS — Z23 Encounter for immunization: Secondary | ICD-10-CM | POA: Diagnosis not present

## 2015-05-08 DIAGNOSIS — J309 Allergic rhinitis, unspecified: Secondary | ICD-10-CM

## 2015-05-08 DIAGNOSIS — S161XXA Strain of muscle, fascia and tendon at neck level, initial encounter: Secondary | ICD-10-CM | POA: Diagnosis not present

## 2015-05-08 MED ORDER — NAPROXEN 500 MG PO TABS: 500.0000 mg | ORAL_TABLET | Freq: Two times a day (BID) | ORAL | Status: DC

## 2015-05-08 MED ORDER — METHYLPREDNISOLONE ACETATE 80 MG/ML IJ SUSP
80.0000 mg | Freq: Once | INTRAMUSCULAR | Status: AC
  Administered 2015-05-08: 80 mg via INTRAMUSCULAR

## 2015-05-08 MED ORDER — BENZONATATE 200 MG PO CAPS: 200.0000 mg | ORAL_CAPSULE | Freq: Three times a day (TID) | ORAL | Status: DC | PRN

## 2015-05-08 MED ORDER — FLUTICASONE PROPIONATE 50 MCG/ACT NA SUSP: 2.0000 | Freq: Every day | NASAL | Status: DC

## 2015-05-08 MED ORDER — KETOROLAC TROMETHAMINE 60 MG/2ML IM SOLN
60.0000 mg | Freq: Once | INTRAMUSCULAR | Status: AC
  Administered 2015-05-08: 60 mg via INTRAMUSCULAR

## 2015-05-08 MED ORDER — CYCLOBENZAPRINE HCL 10 MG PO TABS: 10.0000 mg | ORAL_TABLET | Freq: Three times a day (TID) | ORAL | Status: DC | PRN

## 2015-05-08 NOTE — Progress Notes (Signed)
Subjective:    Patient ID: Richard Franco, male    DOB: 08/02/1965, 49 y.o.   MRN: [id number]  Cough Associated symptoms include headaches. Pertinent negatives include no fever or weight loss.  Neck Pain  This is a new problem. The current episode started in the past 7 days. The problem occurs constantly. The problem has been unchanged. Associated with: Cough. The pain is present in the right side. The quality of the pain is described as aching and shooting. The pain is at a severity of 8/10. The pain is moderate. The symptoms are aggravated by sneezing and coughing. Associated symptoms include headaches. Pertinent negatives include no fever, numbness, photophobia, syncope, visual change, weakness or weight loss. He has tried NSAIDs for the symptoms. The treatment provided mild relief.   *Pt has a history of neck fusion about 5-8 years ago.    Review of Systems  Constitutional: Negative.  Negative for fever and weight loss.  Eyes: Negative for photophobia.  Respiratory: Positive for cough.   Cardiovascular: Negative.  Negative for syncope.  Gastrointestinal: Negative.   Endocrine: Negative.   Genitourinary: Negative.   Musculoskeletal: Positive for neck pain.  Neurological: Positive for headaches. Negative for weakness and numbness.  Hematological: Negative.   Psychiatric/Behavioral: Negative.   All other systems reviewed and are negative.      Objective:   Physical Exam  Constitutional: He is oriented to person, place, and time. He appears well-developed and well-nourished. No distress.  HENT:  Head: Normocephalic.  Right Ear: External ear normal.  Left Ear: External ear normal.  Nasal passage erythemas with mild swelling  Oropharynx erythemas   Eyes: Pupils are equal, round, and reactive to light. Right eye exhibits no discharge. Left eye exhibits no discharge.  Neck: Normal range of motion. Neck supple. No thyromegaly present.  Cardiovascular: Normal rate, regular  rhythm, normal heart sounds and intact distal pulses.   No murmur heard. Pulmonary/Chest: Effort normal and breath sounds normal. No respiratory distress. He has no wheezes.  Abdominal: Soft. Bowel sounds are normal. He exhibits no distension. There is no tenderness.  Musculoskeletal: He exhibits no edema or tenderness.  Limited  ROM rotating neck to left side 90 degree   Neurological: He is alert and oriented to person, place, and time. He has normal reflexes. No cranial nerve deficit.  Skin: Skin is warm and dry. No rash noted. No erythema.  Psychiatric: He has a normal mood and affect. His behavior is normal. Judgment and thought content normal.  Vitals reviewed.     BP 123/79 mmHg  Pulse 105  Temp(Src) 97 F (36.1 C) (Oral)  Ht 5\' 9"  (1.753 m)  Wt 186 lb 3.2 oz (84.46 kg)  BMI 27.48 kg/m2     Assessment & Plan:  1. Allergic rhinitis, unspecified allergic rhinitis type - benzonatate (TESSALON) 200 MG capsule; Take 1 capsule (200 mg total) by mouth 3 (three) times daily as needed.  Dispense: 30 capsule; Refill: 1 - fluticasone (FLONASE) 50 MCG/ACT nasal spray; Place 2 sprays into both nostrils daily.  Dispense: 16 g; Refill: 6  2. Neck strain, initial encounter -Rest -Ice and heat as needed -No other NSAID's while taking naprosyn -Sedation precaution discussed -RTO prn  - naproxen (NAPROSYN) 500 MG tablet; Take 1 tablet (500 mg total) by mouth 2 (two) times daily with a meal.  Dispense: 30 tablet; Refill: 0 - cyclobenzaprine (FLEXERIL) 10 MG tablet; Take 1 tablet (10 mg total) by mouth 3 (three) times daily as needed  for muscle spasms.  Dispense: 30 tablet; Refill: 0 - ketorolac (TORADOL) injection 60 mg; Inject 2 mLs (60 mg total) into the muscle once. - methylPREDNISolone acetate (DEPO-MEDROL) injection 80 mg; Inject 1 mL (80 mg total) into the muscle once.  Evelina Dun, FNP

## 2015-05-08 NOTE — Patient Instructions (Signed)
Cervical Sprain  A cervical sprain is an injury in the neck in which the strong, fibrous tissues (ligaments) that connect your neck bones stretch or tear. Cervical sprains can range from mild to severe. Severe cervical sprains can cause the neck vertebrae to be unstable. This can lead to damage of the spinal cord and can result in serious nervous system problems. The amount of time it takes for a cervical sprain to get better depends on the cause and extent of the injury. Most cervical sprains heal in 1 to 3 weeks.  CAUSES   Severe cervical sprains may be caused by:    Contact sport injuries (such as from football, rugby, wrestling, hockey, auto racing, gymnastics, diving, martial arts, or boxing).    Motor vehicle collisions.    Whiplash injuries. This is an injury from a sudden forward and backward whipping movement of the head and neck.   Falls.   Mild cervical sprains may be caused by:    Being in an awkward position, such as while cradling a telephone between your ear and shoulder.    Sitting in a chair that does not offer proper support.    Working at a poorly designed computer station.    Looking up or down for long periods of time.   SYMPTOMS    Pain, soreness, stiffness, or a burning sensation in the front, back, or sides of the neck. This discomfort may develop immediately after the injury or slowly, 24 hours or more after the injury.    Pain or tenderness directly in the middle of the back of the neck.    Shoulder or upper back pain.    Limited ability to move the neck.    Headache.    Dizziness.    Weakness, numbness, or tingling in the hands or arms.    Muscle spasms.    Difficulty swallowing or chewing.    Tenderness and swelling of the neck.   DIAGNOSIS   Most of the time your health care provider can diagnose a cervical sprain by taking your history and doing a physical exam. Your health care provider will ask about previous neck injuries and any known neck  problems, such as arthritis in the neck. X-rays may be taken to find out if there are any other problems, such as with the bones of the neck. Other tests, such as a CT scan or MRI, may also be needed.   TREATMENT   Treatment depends on the severity of the cervical sprain. Mild sprains can be treated with rest, keeping the neck in place (immobilization), and pain medicines. Severe cervical sprains are immediately immobilized. Further treatment is done to help with pain, muscle spasms, and other symptoms and may include:   Medicines, such as pain relievers, numbing medicines, or muscle relaxants.    Physical therapy. This may involve stretching exercises, strengthening exercises, and posture training. Exercises and improved posture can help stabilize the neck, strengthen muscles, and help stop symptoms from returning.   HOME CARE INSTRUCTIONS    Put ice on the injured area.     Put ice in a plastic bag.     Place a towel between your skin and the bag.     Leave the ice on for 15-20 minutes, 3-4 times a day.    If your injury was severe, you may have been given a cervical collar to wear. A cervical collar is a two-piece collar designed to keep your neck from moving while it heals.      Do not remove the collar unless instructed by your health care provider.    If you have long hair, keep it outside of the collar.    Ask your health care provider before making any adjustments to your collar. Minor adjustments may be required over time to improve comfort and reduce pressure on your chin or on the back of your head.    Ifyou are allowed to remove the collar for cleaning or bathing, follow your health care provider's instructions on how to do so safely.    Keep your collar clean by wiping it with mild soap and water and drying it completely. If the collar you have been given includes removable pads, remove them every 1-2 days and hand wash them with soap and water. Allow them to air dry. They should be completely  dry before you wear them in the collar.    If you are allowed to remove the collar for cleaning and bathing, wash and dry the skin of your neck. Check your skin for irritation or sores. If you see any, tell your health care provider.    Do not drive while wearing the collar.    Only take over-the-counter or prescription medicines for pain, discomfort, or fever as directed by your health care provider.    Keep all follow-up appointments as directed by your health care provider.    Keep all physical therapy appointments as directed by your health care provider.    Make any needed adjustments to your workstation to promote good posture.    Avoid positions and activities that make your symptoms worse.    Warm up and stretch before being active to help prevent problems.   SEEK MEDICAL CARE IF:    Your pain is not controlled with medicine.    You are unable to decrease your pain medicine over time as planned.    Your activity level is not improving as expected.   SEEK IMMEDIATE MEDICAL CARE IF:    You develop any bleeding.   You develop stomach upset.   You have signs of an allergic reaction to your medicine.    Your symptoms get worse.    You develop new, unexplained symptoms.    You have numbness, tingling, weakness, or paralysis in any part of your body.   MAKE SURE YOU:    Understand these instructions.   Will watch your condition.   Will get help right away if you are not doing well or get worse.     This information is not intended to replace advice given to you by your health care provider. Make sure you discuss any questions you have with your health care provider.     Document Released: 04/07/2007 Document Revised: 06/15/2013 Document Reviewed: 12/16/2012  Elsevier Interactive Patient Education 2016 [employer]

## 2015-05-09 ENCOUNTER — Ambulatory Visit (INDEPENDENT_AMBULATORY_CARE_PROVIDER_SITE_OTHER): Payer: Medicare Other | Admitting: Nurse Practitioner

## 2015-05-09 ENCOUNTER — Encounter: Payer: Self-pay | Admitting: Nurse Practitioner

## 2015-05-09 VITALS — BP 110/80 | HR 101 | Ht 69.0 in | Wt 187.8 lb

## 2015-05-09 DIAGNOSIS — R079 Chest pain, unspecified: Secondary | ICD-10-CM | POA: Diagnosis not present

## 2015-05-09 NOTE — Patient Instructions (Addendum)
We will be checking the following labs today - NONE   Medication Instructions:    Continue with your current medicines.     Testing/Procedures To Be Arranged:  N/A  Follow-Up:   See Dr. Aundra Dubin in 6 months.     Other Special Instructions:   If your cough persists/worsens - see Pulmonary     If you need a refill on your cardiac medications before your next appointment, please call your pharmacy.   Call the [place] office at [phone] if you have any questions, problems or concerns.

## 2015-05-09 NOTE — Progress Notes (Signed)
CARDIOLOGY OFFICE NOTE  Date:  05/09/2015    Richard Franco Date of Birth: 1966-03-26 Medical Record [id number]  PCP:  Redge Gainer, MD  Cardiologist:  Aundra Dubin    Chief Complaint  Patient presents with  . Chest Pain    Follow up visit - seen for Dr. Aundra Dubin    History of Present Illness: Richard Franco is a 49 y.o. male who presents today for a follow up visit. Seen for Dr. Aundra Dubin. He has a history of chest pain and ground glass infiltrates on lung imaging and was initially admitted in the past with pleuritic chest pain and exertional dyspnea in 10/13. He ended up having a left heart cath, which showed no significant coronary disease. Echo was normal-appearing. CTA of the chest, however, showed diffuse ground glass infiltrates. Patient was treated with steroids and has been followed by pulmonary. The steroids significantly improved his symptoms. He had a transbronchial biopsy with no definite etiology lung findings noted, possible organizing pneumonia. He was actually referred to pulmonary at [facility]. He says they just told him that whatever process it was that he had seemed to have resolved. He was readmitted in 2/14 with chest pain and had troponin elevation to 4.42. LHC was done again, again demonstrating normal coronaries. Due to concern for coronary vasospasm, he was started on Imdur but was unable to tolerate due to headache so diltiazem CD was begun and titrated up to 360 mg daily, this was transitioned eventually to amlodipine. He also had polyarthralgias and was referred to rheumatology. Autoimmune workup was negative. He was seen by pulmonary and had a CPX, suggesting deconditioning as a cause for his exertional dyspnea.   Last seen back in June of 2015 by Dr. Aundra Dubin. Rare chest pain noted at that time.   I saw him back in September of this year - back smoking - having more chest pain. Some palpitations. Not exercising regularly. Using more sl NTG. I put him on  Lopressor - he was going to consider rechallenging with Imdur.   Comes in today. Here alone. He seems to be doing better. Did not tolerate the metoprolol - made him too tired - took about 1/2 the prescription. His chest pain has improved and has continued to not really be an issue. No NTG use since last seen. Did get a bad cough - saw PCP yesterday - this is now improving. He has seen pulmonary in the past due to his abnormal CT scans - last study in December was stable. He has stopped smoking. Admits he could do better with general health measures.   Past Medical History:  1. Headache  2. Osteoarthritis  3. Depression  4. Chronic cervical spine disease, status post multiple surgeries with chronic pain  5. Insomnia, late onset of sleep, and he sleeps until late in the morning  6. Tobacco abuse: Quit 10/13.  7. GERD  8. Chest pain: ETT-myoview (5/07): No ischemia or infarction. ETT (3/11) normal. LHC (10/13): EF 60%, normal coronary arteries. Admit with CP in 2/14, troponin 4.4. LHC (2/14) with normal coronaries. Possible coronary vasospasm, improved with calcium channel blockers.  9. Echo (10/13) with EF 55-60%, normal RV, normal valves.  10. Ground glass lung infiltrates: Admitted with pleuritic chest pain in 10/13. CTA chest showed multiple areas of ground glass opacity. Transbronchial biopsy unrevealing. Improved with steroids. Ddx = infection versus eosinophilic PNA versus organizing PNA versus pneumoconiosis.  11. Palpitations 12. Hyperlipidemia 13. Polyarthralgia of uncertain etiology: Negative workup  by rheumatology.  14. CPX (9/14): peak VO2 25.6 (76% predicted), VE/VCO2 31.2, RER 1.28, dyspnea thought due to deconditioning.    Past Medical History  Diagnosis Date  . Migraine headache     pt takes inderal for headaches  . Arthritis     s/p neck surgery  . Fibromyalgia   . Depression   . GERD (gastroesophageal reflux disease)   . Testosterone deficiency   .  Anxiety   . Complication of anesthesia     " difficult to urinate after "  . Abnormal chest CT 03/2012    a. Scattered patchy ground glass opacities and pulmonary nodules. b. F/u CT angio 07/2012: scattered stable bilat pulm nodules.  . NSTEMI (non-ST elevated myocardial infarction) ([place])     a. Normal cath 03/2012. b. 07/2012: troponin 4, normal cors, ?vasospasm.  . Tobacco abuse     Past Surgical History  Procedure Laterality Date  . Posterior cervical fusion/foraminotomy  05/09/2011    Procedure: POSTERIOR CERVICAL FUSION/FORAMINOTOMY LEVEL 1;  Surgeon: Hosie Spangle;  Location: [place] NEURO ORS;  Service: Neurosurgery;  Laterality: N/A;  C4/5 posterior arthrodesis with instrumentation   . Cystectomy    . Cardiac catheterization  03/25/2012  . Ankle fusion    . Video bronchoscopy  05/06/2012    Procedure: VIDEO BRONCHOSCOPY WITH FLUORO;  Surgeon: Jyl Heinz, MD;  Location: [facility] ENDOSCOPY;  Service: Cardiopulmonary;  Laterality: Bilateral;  . Fracture surgery  2006    left ankle  . Left heart catheterization with coronary angiogram N/A 03/25/2012    Procedure: LEFT HEART CATHETERIZATION WITH CORONARY ANGIOGRAM;  Surgeon: Burnell Blanks, MD;  Location: [facility] CATH LAB;  Service: Cardiovascular;  Laterality: N/A;  . Left heart catheterization with coronary angiogram N/A 08/11/2012    Procedure: LEFT HEART CATHETERIZATION WITH CORONARY ANGIOGRAM;  Surgeon: Peter M Martinique, MD;  Location: [facility] CATH LAB;  Service: Cardiovascular;  Laterality: N/A;     Medications: Current Outpatient Prescriptions  Medication Sig Dispense Refill  . amLODipine (NORVASC) 5 MG tablet Take 1 tablet (5 mg total) by mouth daily. 30 tablet 11  . aspirin EC 81 MG EC tablet Take 1 tablet (81 mg total) by mouth daily.    . benzonatate (TESSALON) 200 MG capsule Take 1 capsule (200 mg total) by mouth 3 (three) times daily as needed. 30 capsule 1  . busPIRone (BUSPAR) 10 MG tablet Take 10 mg by mouth 2 (two) times  daily.    . cyclobenzaprine (FLEXERIL) 10 MG tablet Take 1 tablet (10 mg total) by mouth 3 (three) times daily as needed for muscle spasms. 30 tablet 0  . FLUoxetine (PROZAC) 20 MG capsule Take 40 mg by mouth daily.     . fluticasone (FLONASE) 50 MCG/ACT nasal spray Place 2 sprays into both nostrils daily. 16 g 6  . HYDROcodone-acetaminophen (NORCO/VICODIN) 5-325 MG tablet TK 1 T PO  Q 6 H PRN P for headaches  0  . ibuprofen (ADVIL,MOTRIN) 200 MG tablet Take 200 mg by mouth every 6 (six) hours as needed for headache.    . lamoTRIgine (LAMICTAL) 100 MG tablet Take 100 mg by mouth 2 (two) times daily.    . lansoprazole (PREVACID) 30 MG capsule Take 1 capsule (30 mg total) by mouth 2 (two) times daily. 60 capsule 2  . naproxen (NAPROSYN) 500 MG tablet Take 1 tablet (500 mg total) by mouth 2 (two) times daily with a meal. 30 tablet 0  . nitroGLYCERIN (NITROSTAT) 0.4 MG SL tablet  DISSOLVE ONE TABLET UNDER THE TONGUE EVERY 5 MINUTES AS NEEDED FOR CHEST PAIN.  DO NOT EXCEED A TOTAL OF 3 DOSES IN 15 MINUTES 25 tablet 3   No current facility-administered medications for this visit.    Allergies: Allergies  Allergen Reactions  . Midodrine Hcl Swelling    Tongue swelling  . Sulfonamide Derivatives Rash         Social History: The patient  reports that he quit smoking about 3 weeks ago. His smoking use included Cigarettes. He has a 27 pack-year smoking history. He has quit using smokeless tobacco. His smokeless tobacco use included Chew. He reports that he does not drink alcohol or use illicit drugs.   Family History: The patient's family history includes Asthma in his sister; Coronary artery disease in his paternal uncle; Diabetes in his mother.   Review of Systems: Please see the history of present illness.   Otherwise, the review of systems is positive for none.   All other systems are reviewed and negative.   Physical Exam: VS:  BP 110/80 mmHg  Pulse 101  Ht 5\' 9"  (1.753 m)  Wt 187 lb  12.8 oz (85.186 kg)  BMI 27.72 kg/m2  SpO2 98% .  BMI Body mass index is 27.72 kg/(m^2).  Wt Readings from Last 3 Encounters:  05/09/15 187 lb 12.8 oz (85.186 kg)  05/08/15 186 lb 3.2 oz (84.46 kg)  03/22/15 188 lb 12.8 oz (85.639 kg)    General: Pleasant. Well developed, well nourished and in no acute distress.  HEENT: Normal. Neck: Supple, no JVD, carotid bruits, or masses noted.  Cardiac: Regular rate and rhythm. HR down to 88 by me. No murmurs, rubs, or gallops. No edema.  Respiratory:  Lungs are clear to auscultation bilaterally with normal work of breathing. No wheezing noted.  GI: Soft and nontender.  MS: No deformity or atrophy. Gait and ROM intact. Skin: Warm and dry. Color is normal.  Neuro:  Strength and sensation are intact and no gross focal deficits noted.  Psych: Alert, appropriate and with normal affect.   LABORATORY DATA:  EKG:  EKG is not ordered today.   Lab Results  Component Value Date   WBC 5.9 03/22/2015   HGB 15.6 03/22/2015   HCT 46.3 03/22/2015   PLT 205.0 03/22/2015   GLUCOSE 98 03/22/2015   CHOL 202* 03/22/2015   TRIG 116.0 03/22/2015   HDL 58.80 03/22/2015   LDLCALC 120* 03/22/2015   ALT 21 03/22/2015   AST 24 03/22/2015   NA 139 03/22/2015   K 5.1 03/22/2015   CL 102 03/22/2015   CREATININE 1.17 03/22/2015   BUN 10 03/22/2015   CO2 32 03/22/2015   TSH 1.074 Test methodology is 3rd generation TSH 08/18/2009   INR 0.99 08/10/2012   HGBA1C 5.7* 08/11/2012    BNP (last 3 results) No results for input(s): BNP in the last 8760 hours.  ProBNP (last 3 results) No results for input(s): PROBNP in the last 8760 hours.   Other Studies Reviewed Today:  CPX Conclusion from 02/2013: Exercise testing with gas exchange demonstrates a mild functional limitation when compared to matched sedentary norms. The patient's tidal volume during exercise appears to be a limiting factor, couple with the low respiratory rate. The elevated tidal volume  during exercise can induce a sensation of not being able to get a full breath and may cause chest discomfort. There also appears to be significant deconditioning. ^^^Preliminary CPX Results, Finalized results will be forwarded when completed  by interpreting physician.^^^  Prepared by: Linzie Collin, MEd, ACSM-RCEP Sr. Exercise Physiologist 02/23/2013 2:52 PM  Echo Study Conclusions from 03/2012  - Left ventricle: The cavity size was normal. Wall thickness was normal. Systolic function was normal. The estimated ejection fraction was in the range of 55% to 60%. Wall motion was normal; there were no regional wall motion abnormalities. Doppler parameters are consistent with abnormal left ventricular relaxation (grade 1 diastolic dysfunction). - Aortic valve: There was no stenosis. - Mitral valve: No significant regurgitation. - Right ventricle: The cavity size was normal. Systolic function was normal. - Pulmonary arteries: No complete TR doppler jet so unable to estimate PA systolic pressure. - Inferior vena cava: The vessel was normal in size; the respirophasic diameter changes were in the normal range (= 50%); findings are consistent with normal central venous pressure. - Pericardium, extracardiac: There was no pericardial effusion. Impressions:  - Normal LV size and systolic function, EF 0000000. Normal RV size and systolic function. No significant valvular abnormalities. No pericardial effusion.  Coronary angiography from 07/2012: Coronary dominance: left  Left mainstem: Short, normal.  Left anterior descending (LAD): Normal.  Left circumflex (LCx): Dominant, normal.  Right coronary artery (RCA): small, nondominant, normal.  Left ventriculography: Left ventricular systolic function is normal, LVEF is estimated at 55-65%, there is no significant mitral regurgitation   Final Conclusions:  1. Normal coronary anatomy. 2. Normal LV  function.  Recommendations: treat empirically for spasm.  Collier Salina [facility] 08/11/2012, 1:52 PM  Assessment/Plan: 1. Coronary vasospasm: Based on past symptoms and past elevated troponin with normal coronaries on cath, suspect coronary artery vasospasm. He initially improved on calcium channel blocker (amlodipine). He is unable to tolerate Imdur but probably did not give it enough time. He has not tolerated Lopressor. He has stopped smoking. Needs to engage in healthy habits.   2. Palpitations: Suspect premature beats. Would cut back on the caffeine. Stop smoking. Not really an issue at this time. HR was up when he first arrived but back down by the time of my exam.   3. Ground glass infiltrates: Undifferentiated lung disease. Possible organizing pneumonia He has improved with steroids. He has been released from pulmonary. Transbronchial biopsy was not revealing. Seems to have resolved spontaneously. Last CT was ok. Has had recent cough ?uri - would favor going back to pulmonary if needed but he says this is improving.   4. Fatigue: This is chronic.  Still suspect deconditioning (supported by CPX in 9/14). Suggested again that he start exercising, would like to see him walk 4-5 times/week.   Current medicines are reviewed with the patient today.  The patient does not have concerns regarding medicines other than what has been noted above.  The following changes have been made:  See above.  Labs/ tests ordered today include:   No orders of the defined types were placed in this encounter.     Disposition:   FU with Dr. Aundra Dubin in 6 months.   Patient is agreeable to this plan and will call if any problems develop in the interim.   Signed: Burtis Junes, RN, ANP-C 05/09/2015 12:03 PM  [facility] [address] [place], [place]  [address] Phone: [phone] Fax: [phone]

## 2015-06-06 ENCOUNTER — Ambulatory Visit: Payer: Medicare Other | Admitting: Gastroenterology

## 2015-06-20 ENCOUNTER — Other Ambulatory Visit: Payer: Self-pay | Admitting: Gastroenterology

## 2015-06-21 ENCOUNTER — Encounter (HOSPITAL_COMMUNITY): Payer: Self-pay | Admitting: Emergency Medicine

## 2015-06-21 ENCOUNTER — Telehealth: Payer: Self-pay | Admitting: Cardiology

## 2015-06-21 ENCOUNTER — Emergency Department (HOSPITAL_COMMUNITY): Payer: Medicare Other

## 2015-06-21 ENCOUNTER — Observation Stay (HOSPITAL_COMMUNITY): Payer: Medicare Other

## 2015-06-21 ENCOUNTER — Observation Stay (HOSPITAL_COMMUNITY)
Admission: EM | Admit: 2015-06-21 | Discharge: 2015-06-22 | Disposition: A | Discharge status: Home / Self Care | Payer: Medicare Other | Attending: Interventional Cardiology | Admitting: Interventional Cardiology

## 2015-06-21 DIAGNOSIS — F419 Anxiety disorder, unspecified: Secondary | ICD-10-CM | POA: Diagnosis not present

## 2015-06-21 DIAGNOSIS — M797 Fibromyalgia: Secondary | ICD-10-CM | POA: Insufficient documentation

## 2015-06-21 DIAGNOSIS — K219 Gastro-esophageal reflux disease without esophagitis: Secondary | ICD-10-CM | POA: Insufficient documentation

## 2015-06-21 DIAGNOSIS — G43909 Migraine, unspecified, not intractable, without status migrainosus: Secondary | ICD-10-CM | POA: Diagnosis not present

## 2015-06-21 DIAGNOSIS — R0789 Other chest pain: Secondary | ICD-10-CM | POA: Diagnosis not present

## 2015-06-21 DIAGNOSIS — N6459 Other signs and symptoms in breast: Secondary | ICD-10-CM | POA: Diagnosis not present

## 2015-06-21 DIAGNOSIS — R079 Chest pain, unspecified: Principal | ICD-10-CM | POA: Insufficient documentation

## 2015-06-21 DIAGNOSIS — F17210 Nicotine dependence, cigarettes, uncomplicated: Secondary | ICD-10-CM | POA: Insufficient documentation

## 2015-06-21 DIAGNOSIS — R0781 Pleurodynia: Secondary | ICD-10-CM

## 2015-06-21 DIAGNOSIS — E291 Testicular hypofunction: Secondary | ICD-10-CM | POA: Diagnosis not present

## 2015-06-21 DIAGNOSIS — Z79899 Other long term (current) drug therapy: Secondary | ICD-10-CM | POA: Diagnosis not present

## 2015-06-21 DIAGNOSIS — I10 Essential (primary) hypertension: Secondary | ICD-10-CM

## 2015-06-21 DIAGNOSIS — F329 Major depressive disorder, single episode, unspecified: Secondary | ICD-10-CM | POA: Diagnosis not present

## 2015-06-21 DIAGNOSIS — M1990 Unspecified osteoarthritis, unspecified site: Secondary | ICD-10-CM | POA: Insufficient documentation

## 2015-06-21 DIAGNOSIS — G8929 Other chronic pain: Secondary | ICD-10-CM | POA: Diagnosis not present

## 2015-06-21 DIAGNOSIS — R911 Solitary pulmonary nodule: Secondary | ICD-10-CM

## 2015-06-21 DIAGNOSIS — R072 Precordial pain: Secondary | ICD-10-CM

## 2015-06-21 DIAGNOSIS — Z720 Tobacco use: Secondary | ICD-10-CM

## 2015-06-21 DIAGNOSIS — I201 Angina pectoris with documented spasm: Secondary | ICD-10-CM

## 2015-06-21 DIAGNOSIS — Z7982 Long term (current) use of aspirin: Secondary | ICD-10-CM | POA: Insufficient documentation

## 2015-06-21 DIAGNOSIS — R05 Cough: Secondary | ICD-10-CM | POA: Diagnosis not present

## 2015-06-21 DIAGNOSIS — Z8679 Personal history of other diseases of the circulatory system: Secondary | ICD-10-CM

## 2015-06-21 HISTORY — DX: Cervicalgia: M54.2

## 2015-06-21 HISTORY — DX: Family history of other specified conditions: Z84.89

## 2015-06-21 HISTORY — DX: Other chronic pain: G89.29

## 2015-06-21 LAB — TSH: TSH: 1.945 u[IU]/mL (ref 0.350–4.500)

## 2015-06-21 LAB — CBC
HCT: 47.6 % (ref 39.0–52.0)
HCT: 43.9 % (ref 39.0–52.0)
Hemoglobin: 16.3 g/dL (ref 13.0–17.0)
Hemoglobin: 14.9 g/dL (ref 13.0–17.0)
MCH: 31.6 pg (ref 26.0–34.0)
MCH: 31.2 pg (ref 26.0–34.0)
MCHC: 34.2 g/dL (ref 30.0–36.0)
MCHC: 33.9 g/dL (ref 30.0–36.0)
MCV: 92.2 fL (ref 78.0–100.0)
MCV: 91.8 fL (ref 78.0–100.0)
Platelets: 225 10*3/uL (ref 150–400)
Platelets: 198 10*3/uL (ref 150–400)
RBC: 5.16 MIL/uL (ref 4.22–5.81)
RBC: 4.78 MIL/uL (ref 4.22–5.81)
RDW: 13.4 % (ref 11.5–15.5)
RDW: 13.4 % (ref 11.5–15.5)
WBC: 5.9 10*3/uL (ref 4.0–10.5)
WBC: 5.3 10*3/uL (ref 4.0–10.5)

## 2015-06-21 LAB — BASIC METABOLIC PANEL
Anion gap: 7 (ref 5–15)
BUN: 12 mg/dL (ref 6–20)
CO2: 27 mmol/L (ref 22–32)
Calcium: 9.4 mg/dL (ref 8.9–10.3)
Chloride: 106 mmol/L (ref 101–111)
Creatinine, Ser: 1.2 mg/dL (ref 0.61–1.24)
GFR calc Af Amer: >60 mL/min (ref >60)
GFR calc non Af Amer: >60 mL/min (ref >60)
Glucose, Bld: 108 mg/dL — ABNORMAL HIGH (ref 65–99)
Potassium: 4.3 mmol/L (ref 3.5–5.1)
Sodium: 140 mmol/L (ref 135–145)

## 2015-06-21 LAB — I-STAT TROPONIN, ED
Troponin i, poc: 0 ng/mL (ref 0.00–0.08)
Troponin i, poc: 0 ng/mL (ref 0.00–0.08)

## 2015-06-21 LAB — CREATININE, SERUM
Creatinine, Ser: 1.11 mg/dL (ref 0.61–1.24)
GFR calc Af Amer: >60 mL/min (ref >60)
GFR calc non Af Amer: >60 mL/min (ref >60)

## 2015-06-21 LAB — TROPONIN I: Troponin I: <0.03 ng/mL (ref <0.031)

## 2015-06-21 LAB — D-DIMER, QUANTITATIVE (NOT AT ARMC): D-Dimer, Quant: 0.61 ug/mL-FEU — ABNORMAL HIGH (ref 0.00–0.50)

## 2015-06-21 MED ORDER — BUSPIRONE HCL 10 MG PO TABS
10.0000 mg | ORAL_TABLET | Freq: Two times a day (BID) | ORAL | Status: DC
  Administered 2015-06-21 – 2015-06-22 (×2): 10 mg via ORAL
  Filled 2015-06-21 (×2): qty 1

## 2015-06-21 MED ORDER — MORPHINE SULFATE (PF) 2 MG/ML IV SOLN
1.0000 mg | INTRAVENOUS | Status: DC | PRN
  Administered 2015-06-21 – 2015-06-22 (×2): 2 mg via INTRAVENOUS
  Filled 2015-06-21 (×3): qty 1

## 2015-06-21 MED ORDER — PANTOPRAZOLE SODIUM 40 MG PO TBEC
40.0000 mg | DELAYED_RELEASE_TABLET | Freq: Every day | ORAL | Status: DC
  Administered 2015-06-22: 40 mg via ORAL
  Filled 2015-06-21: qty 1

## 2015-06-21 MED ORDER — LAMOTRIGINE 100 MG PO TABS
100.0000 mg | ORAL_TABLET | Freq: Two times a day (BID) | ORAL | Status: DC
  Administered 2015-06-21 – 2015-06-22 (×2): 100 mg via ORAL
  Filled 2015-06-21 (×2): qty 1

## 2015-06-21 MED ORDER — ASPIRIN EC 81 MG PO TBEC
81.0000 mg | DELAYED_RELEASE_TABLET | Freq: Every day | ORAL | Status: DC
  Administered 2015-06-22: 81 mg via ORAL
  Filled 2015-06-21: qty 1

## 2015-06-21 MED ORDER — NITROGLYCERIN IN D5W 200-5 MCG/ML-% IV SOLN: 5.0000 ug/min | Freq: Once | INTRAVENOUS | Status: DC

## 2015-06-21 MED ORDER — FLUOXETINE HCL 20 MG PO CAPS
40.0000 mg | ORAL_CAPSULE | Freq: Every day | ORAL | Status: DC
  Administered 2015-06-22: 40 mg via ORAL
  Filled 2015-06-21: qty 2

## 2015-06-21 MED ORDER — HEPARIN SODIUM (PORCINE) 5000 UNIT/ML IJ SOLN
5000.0000 [IU] | Freq: Three times a day (TID) | INTRAMUSCULAR | Status: DC
  Administered 2015-06-21 – 2015-06-22 (×2): 5000 [IU] via SUBCUTANEOUS
  Filled 2015-06-21 (×2): qty 1

## 2015-06-21 MED ORDER — NITROGLYCERIN IN D5W 200-5 MCG/ML-% IV SOLN
3.0000 ug/min | INTRAVENOUS | Status: DC
  Administered 2015-06-21: 3 ug/min via INTRAVENOUS
  Filled 2015-06-21: qty 250

## 2015-06-21 MED ORDER — ONDANSETRON HCL 4 MG/2ML IJ SOLN: 4.0000 mg | Freq: Four times a day (QID) | INTRAMUSCULAR | Status: DC | PRN

## 2015-06-21 MED ORDER — ACETAMINOPHEN 325 MG PO TABS
650.0000 mg | ORAL_TABLET | ORAL | Status: DC | PRN
  Administered 2015-06-21: 650 mg via ORAL
  Filled 2015-06-21: qty 2

## 2015-06-21 MED ORDER — NITROGLYCERIN 0.4 MG SL SUBL
0.4000 mg | SUBLINGUAL_TABLET | SUBLINGUAL | Status: DC | PRN
  Filled 2015-06-21: qty 1

## 2015-06-21 MED ORDER — ASPIRIN EC 81 MG PO TBEC: 81.0000 mg | DELAYED_RELEASE_TABLET | Freq: Every day | ORAL | Status: DC

## 2015-06-21 MED ORDER — AMLODIPINE BESYLATE 5 MG PO TABS
5.0000 mg | ORAL_TABLET | Freq: Every day | ORAL | Status: DC
  Administered 2015-06-22: 5 mg via ORAL
  Filled 2015-06-21: qty 1

## 2015-06-21 MED ORDER — KETOROLAC TROMETHAMINE 30 MG/ML IJ SOLN
30.0000 mg | Freq: Once | INTRAMUSCULAR | Status: AC
  Administered 2015-06-21: 30 mg via INTRAVENOUS
  Filled 2015-06-21: qty 1

## 2015-06-21 NOTE — Telephone Encounter (Signed)
°  New Prob   Pt c/o of Chest Pain: STAT if CP now or developed within 24 hours  1. Are you having CP right now? Yes  2. Are you experiencing any other symptoms (ex. SOB, nausea, vomiting, sweating)? Pain to the L arm  3. How long have you been experiencing CP? 1.5 hours ago  4. Is your CP continuous or coming and going? Constant  5. Have you taken Nitroglycerin? Took nitro today without much relief ?

## 2015-06-21 NOTE — ED Notes (Signed)
Attempted Report x1.   

## 2015-06-21 NOTE — ED Notes (Signed)
Pt c/o chest pain, hx MI in past -- has taken 2 NTG at home, no relief, requesting NTG or something for pain at present-- MI was approx 2 yrs ago-- pain 8/10-- ekg shown to dr Wilson Singer --

## 2015-06-21 NOTE — ED Notes (Signed)
Phlebotomy at the bedside  

## 2015-06-21 NOTE — Telephone Encounter (Signed)
Spoke to pt and he states that he is currently having active CP. Pt took 2 nitros with no relief noted. Pt states pain does radiate into left arm. Advised pt that he needed to proceed to the ER for evaluation. Pt stated that there was no one there to take him but that he could drive. Advised pt that he absolutely should not drive and should call EMS. Pt did not want to go via ambulance but stated that he had someone he could call to bring him to [facility] ER. Spoke with charge nurse at ED to inform her of pt coming from home via personal vehicle.

## 2015-06-21 NOTE — ED Notes (Signed)
Pt returned from X-ray.  

## 2015-06-21 NOTE — ED Notes (Signed)
Cardiology at the bedside.

## 2015-06-21 NOTE — ED Provider Notes (Signed)
CSN: [id number]     Arrival date & time 06/21/15  1429 History   First MD Initiated Contact with Patient 06/21/15 1516     Chief Complaint  Patient presents with  . Chest Pain      HPI  Pt presents with a cc of CP.  Started at rest at 09:00 today at home.  "sharp and tight" to Left parasternal and sternum.  Occasionally a "light pain" to LUE. No sob, n/v. Not pleuretic. Holding his breath "helps a little". Not positional. Nor pleuretic.  Pt with h/o CP over several years. Prioe normal cath 2013. Then NSTEMI 2014 with 2nd normal cath and working dx of ?Vasospasm.  Did not tolerete B blocker, or Imdur. Currently txd with Cardizem and Norvasc.  Prioe abnormal CTA with Ground Glass opacities, resolved p steroid treatment.  Took NTG sl x 2 today with no response.   Still smoking 1/2 PPD.  Past Medical History  Diagnosis Date  . Migraine headache     pt takes inderal for headaches  . Arthritis     s/p neck surgery  . Fibromyalgia   . Depression   . GERD (gastroesophageal reflux disease)   . Testosterone deficiency   . Anxiety   . Complication of anesthesia     " difficult to urinate after "  . Abnormal chest CT 03/2012    a. Scattered patchy ground glass opacities and pulmonary nodules. b. F/u CT angio 07/2012: scattered stable bilat pulm nodules.  . NSTEMI (non-ST elevated myocardial infarction) ([place])     a. Normal cath 03/2012. b. 07/2012: troponin 4, normal cors, ?vasospasm.  . Tobacco abuse    Past Surgical History  Procedure Laterality Date  . Posterior cervical fusion/foraminotomy  05/09/2011    Procedure: POSTERIOR CERVICAL FUSION/FORAMINOTOMY LEVEL 1;  Surgeon: Hosie Spangle;  Location: [place] NEURO ORS;  Service: Neurosurgery;  Laterality: N/A;  C4/5 posterior arthrodesis with instrumentation   . Cystectomy    . Cardiac catheterization  03/25/2012  . Ankle fusion    . Video bronchoscopy  05/06/2012    Procedure: VIDEO BRONCHOSCOPY WITH FLUORO;  Surgeon: Jyl Heinz, MD;  Location: [facility] ENDOSCOPY;  Service: Cardiopulmonary;  Laterality: Bilateral;  . Fracture surgery  2006    left ankle  . Left heart catheterization with coronary angiogram N/A 03/25/2012    Procedure: LEFT HEART CATHETERIZATION WITH CORONARY ANGIOGRAM;  Surgeon: Burnell Blanks, MD;  Location: [facility] CATH LAB;  Service: Cardiovascular;  Laterality: N/A;  . Left heart catheterization with coronary angiogram N/A 08/11/2012    Procedure: LEFT HEART CATHETERIZATION WITH CORONARY ANGIOGRAM;  Surgeon: Peter M Martinique, MD;  Location: [facility] CATH LAB;  Service: Cardiovascular;  Laterality: N/A;   Family History  Problem Relation Age of Onset  . Diabetes Mother   . Coronary artery disease Paternal Uncle   . Asthma Sister    Social History  Substance Use Topics  . Smoking status: Current Every Day Smoker -- 1.00 packs/day for 27 years    Types: Cigarettes    Last Attempt to Quit: 04/16/2015  . Smokeless tobacco: Former Systems developer    Types: Chew  . Alcohol Use: 0.0 oz/week    0 Standard drinks or equivalent per week     Comment: 5-6 beers (12 oz)    Review of Systems  Constitutional: Negative for fever, chills, diaphoresis, appetite change and fatigue.  HENT: Negative for mouth sores, sore throat and trouble swallowing.   Eyes: Negative for visual disturbance.  Respiratory:  Negative for cough, chest tightness, shortness of breath and wheezing.   Cardiovascular: Positive for chest pain. Negative for palpitations and leg swelling.  Gastrointestinal: Negative for nausea, vomiting, abdominal pain, diarrhea and abdominal distention.  Endocrine: Negative for polydipsia, polyphagia and polyuria.  Genitourinary: Negative for dysuria, frequency and hematuria.  Musculoskeletal: Negative for gait problem.  Skin: Negative for color change, pallor and rash.  Neurological: Negative for dizziness, syncope, light-headedness and headaches.  Hematological: Does not bruise/bleed easily.   Psychiatric/Behavioral: Negative for behavioral problems and confusion.      Allergies  Midodrine hcl and Sulfonamide derivatives  Home Medications   Prior to Admission medications   Medication Sig Start Date End Date Taking? Authorizing Provider  amLODipine (NORVASC) 5 MG tablet Take 1 tablet (5 mg total) by mouth daily. 04/18/15  Yes Larey Dresser, MD  aspirin EC 81 MG EC tablet Take 1 tablet (81 mg total) by mouth daily. 08/12/12  Yes Dayna N Dunn, PA-C  busPIRone (BUSPAR) 10 MG tablet Take 10 mg by mouth 2 (two) times daily.   Yes Historical Provider, MD  FLUoxetine (PROZAC) 20 MG capsule Take 40 mg by mouth daily.    Yes Historical Provider, MD  ibuprofen (ADVIL,MOTRIN) 200 MG tablet Take 400 mg by mouth every 6 (six) hours as needed for headache.    Yes Historical Provider, MD  lamoTRIgine (LAMICTAL) 100 MG tablet Take 100 mg by mouth 2 (two) times daily.   Yes Historical Provider, MD  lansoprazole (PREVACID) 30 MG capsule Take 1 capsule (30 mg total) by mouth 2 (two) times daily. 03/10/15  Yes Inda Castle, MD  nitroGLYCERIN (NITROSTAT) 0.4 MG SL tablet DISSOLVE ONE TABLET UNDER THE TONGUE EVERY 5 MINUTES AS NEEDED FOR CHEST PAIN.  DO NOT EXCEED A TOTAL OF 3 DOSES IN 15 MINUTES 03/22/15  Yes Burtis Junes, NP   BP 125/88 mmHg  Pulse 88  Temp(Src) 98.2 F (36.8 C) (Oral)  Resp 18  Ht 5\' 9"  (1.753 m)  Wt 175 lb (79.379 kg)  BMI 25.83 kg/m2  SpO2 100% Physical Exam  Constitutional: He is oriented to person, place, and time. He appears well-developed and well-nourished. No distress.  HENT:  Head: Normocephalic.  Eyes: Conjunctivae are normal. Pupils are equal, round, and reactive to light. No scleral icterus.  Neck: Normal range of motion. Neck supple. No thyromegaly present.  Cardiovascular: Normal rate and regular rhythm.  Exam reveals no gallop and no friction rub.   No murmur heard. NON TTP.    Pulmonary/Chest: Effort normal and breath sounds normal. No respiratory  distress. He has no wheezes. He has no rales.  Abdominal: Soft. Bowel sounds are normal. He exhibits no distension. There is no tenderness. There is no rebound.  Musculoskeletal: Normal range of motion.  Neurological: He is alert and oriented to person, place, and time.  Skin: Skin is warm and dry. No rash noted.  Psychiatric: He has a normal mood and affect. His behavior is normal.    ED Course  Procedures (including critical care time) Labs Review Labs Reviewed  BASIC METABOLIC PANEL - Abnormal; Notable for the following:    Glucose, Bld 108 (*)    All other components within normal limits  CBC  I-STAT TROPOININ, ED  I-STAT TROPOININ, ED    Imaging Review Dg Chest 2 View  06/21/2015  CLINICAL DATA:  LEFT side cramping chest pain, no relief with nitroglycerin at home, pain 8/10, recent dry cough, history coronary artery disease post MI  and cardiac catheterization, smoker, fibromyalgia, GERD EXAM: CHEST  2 VIEW COMPARISON:  09/19/2014 FINDINGS: Normal heart size, mediastinal contours, and pulmonary vascularity. Bronchitic changes with hyperinflation question COPD. No pulmonary infiltrate, pleural effusion, or pneumothorax. Question RIGHT nipple shadow. Prior cervicothoracic fusion. IMPRESSION: COPD changes with question RIGHT nipple shadow; repeat PA chest radiograph with nipple markers recommended to exclude pulmonary nodule. Electronically Signed   By: Lavonia Dana M.D.   On: 06/21/2015 16:29   I have personally reviewed and evaluated these images and lab results as part of my medical decision-making.   EKG Interpretation   Date/Time:  Wednesday June 21 2015 14:43:16 EST Ventricular Rate:  98 PR Interval:  138 QRS Duration: 88 QT Interval:  354 QTC Calculation: 451 R Axis:   70 Text Interpretation:  Normal sinus rhythm Septal infarct , age  undetermined Abnormal ECG Confirmed by Jeneen Rinks  MD, [place] ([id number]) on  06/21/2015 3:18:46 PM      MDM   Final diagnoses:  Chest  pain, unspecified chest pain type    Unchanged EKG. First troponin negative. Given Toradol. Minimal change. With history of possible vasospasm and an STEMI and did discuss the case with cardiology. Patient has been evaluated in the emergency room by cardiology. Admission bed and orders have been placed.    Tanna Furry, MD 06/21/15 [phone]

## 2015-06-21 NOTE — ED Notes (Signed)
MD at the bedside  

## 2015-06-21 NOTE — Progress Notes (Signed)
Richard Dubin, MD notified of positive d.dimer. Pt reports recent trip to [place] within the last 5-6 weeks. Patient currently experiencing chest pain, titrating IV nitroglycerin within parameters given. BP WNL. Will continue to monitor patient closely.

## 2015-06-21 NOTE — H&P (Signed)
Patient ID: KINSEY RUPANI MRN: [id number], DOB/AGE: 1966-01-09   Admit date: 06/21/2015   Primary Physician: Redge Gainer, MD Primary Cardiologist: Dr. Aundra Dubin  Pt. Profile:  Richard Franco is a 49 y.o. male with a history of fibromyalgia, groundglass infiltration, pulmonary nodules, recurrent tobacco abuse, GERD, anxiety and normal coronaries on cath x 2 who came to [facility] ED for evaluation of chest pain.   HPI: As above. Cath 2013 showing no significant coronary artery disease in a setting of pleuritic chest pain. That time CTA of chest showed diffuse groundglass infiltrates. Follow-uu transbronchial biopsy with no definite etiology of lung finding- possible organizing pneumonia. He was referred to pulmonary at [facility] and no significant etiology was found. Last CT of chest was December 2015 which showed stable multiple tiny pulmonary nodule. No further workup recommended.  He was readmitted in 2/14 with chest pain and had troponin elevation to 4.42. LHC was done again, again demonstrating normal coronaries. Due to concern for coronary vasospasm, he was started on Imdur but was unable to tolerate due to headache so diltiazem CD was begun and titrated up to 360 mg daily, this was transitioned eventually to amlodipine. He also had polyarthralgias and was referred to rheumatology. Autoimmune workup was negative. He was seen by pulmonary and had a CPX, suggesting deconditioning as a cause for his exertional dyspnea.   Patient was a smoker previously. He smoked one and half pack a day for 27 years and quit 2013. He again started smoking for the past 6 month (11/2014), currently smokes half a pack a day. He also drinks 3-4 beers a day. Denies use of illicit drug use.  This morning around 9 AM patient developed a sudden onset of sharp/tight left upper chest pain that intermittently radiates to substernal area. Also complaining of occasional left arm numbness. Holding his  breath helps. Bending over makes better and laying flat makes it worse. He took sublingual nitroglycerin x 2 without improvement. This laid him to ED presentation. Currently having 7 out of 10 chest tightness.  EKG without acute abnormality. Point of care troponin negative. Pending chest x-ray.   Problem List  Past Medical History  Diagnosis Date  . Migraine headache     pt takes inderal for headaches  . Arthritis     s/p neck surgery  . Fibromyalgia   . Depression   . GERD (gastroesophageal reflux disease)   . Testosterone deficiency   . Anxiety   . Complication of anesthesia     " difficult to urinate after "  . Abnormal chest CT 03/2012    a. Scattered patchy ground glass opacities and pulmonary nodules. b. F/u CT angio 07/2012: scattered stable bilat pulm nodules.  . NSTEMI (non-ST elevated myocardial infarction) ([place])     a. Normal cath 03/2012. b. 07/2012: troponin 4, normal cors, ?vasospasm.  . Tobacco abuse     Past Surgical History  Procedure Laterality Date  . Posterior cervical fusion/foraminotomy  05/09/2011    Procedure: POSTERIOR CERVICAL FUSION/FORAMINOTOMY LEVEL 1;  Surgeon: Hosie Spangle;  Location: [place] [facility]UR[facility] ORS;  Service: Neurosurgery;  Laterality: N/A;  C4/5 posterior arthrodesis with instrumentation   . Cystectomy    . Cardiac catheterization  03/25/2012  . Ankle fusion    . Video bronchoscopy  05/06/2012    Procedure: VIDEO BRONCHOSCOPY WITH FLUORO;  Surgeon: Jyl Heinz, MD;  Location: [facility] ENDOSCOPY;  Service: Cardiopulmonary;  Laterality: Bilateral;  . Fracture surgery  2006  left ankle  . Left heart catheterization with coronary angiogram N/A 03/25/2012    Procedure: LEFT HEART CATHETERIZATION WITH CORONARY ANGIOGRAM;  Surgeon: Burnell Blanks, MD;  Location: [facility] CATH LAB;  Service: Cardiovascular;  Laterality: N/A;  . Left heart catheterization with coronary angiogram N/A 08/11/2012    Procedure: LEFT HEART CATHETERIZATION WITH CORONARY  ANGIOGRAM;  Surgeon: Peter M Martinique, MD;  Location: [facility] CATH LAB;  Service: Cardiovascular;  Laterality: N/A;     Allergies  Allergies  Allergen Reactions  . Midodrine Hcl Swelling    Tongue swelling  . Sulfonamide Derivatives Rash          Home Medications  Prior to Admission medications   Medication Sig Start Date End Date Taking? Authorizing Provider  amLODipine (NORVASC) 5 MG tablet Take 1 tablet (5 mg total) by mouth daily. 04/18/15   Larey Dresser, MD  aspirin EC 81 MG EC tablet Take 1 tablet (81 mg total) by mouth daily. 08/12/12   Dayna N Dunn, PA-C  benzonatate (TESSALON) 200 MG capsule Take 1 capsule (200 mg total) by mouth 3 (three) times daily as needed. 05/08/15   Sharion Balloon, FNP  busPIRone (BUSPAR) 10 MG tablet Take 10 mg by mouth 2 (two) times daily.    Historical Provider, MD  cyclobenzaprine (FLEXERIL) 10 MG tablet Take 1 tablet (10 mg total) by mouth 3 (three) times daily as needed for muscle spasms. 05/08/15   Sharion Balloon, FNP  FLUoxetine (PROZAC) 20 MG capsule Take 40 mg by mouth daily.     Historical Provider, MD  fluticasone (FLONASE) 50 MCG/ACT nasal spray Place 2 sprays into both nostrils daily. 05/08/15   Sharion Balloon, FNP  HYDROcodone-acetaminophen (NORCO/VICODIN) 5-325 MG tablet TK 1 T PO  Q 6 H PRN P for headaches 01/13/15   Historical Provider, MD  ibuprofen (ADVIL,MOTRIN) 200 MG tablet Take 200 mg by mouth every 6 (six) hours as needed for headache.    Historical Provider, MD  lamoTRIgine (LAMICTAL) 100 MG tablet Take 100 mg by mouth 2 (two) times daily.    Historical Provider, MD  lansoprazole (PREVACID) 30 MG capsule Take 1 capsule (30 mg total) by mouth 2 (two) times daily. 03/10/15   Inda Castle, MD  naproxen (NAPROSYN) 500 MG tablet Take 1 tablet (500 mg total) by mouth 2 (two) times daily with a meal. 05/08/15   Sharion Balloon, FNP  nitroGLYCERIN (NITROSTAT) 0.4 MG SL tablet DISSOLVE ONE TABLET UNDER THE TONGUE EVERY 5 MINUTES AS NEEDED  FOR CHEST PAIN.  DO NOT EXCEED A TOTAL OF 3 DOSES IN 15 MINUTES 03/22/15   Burtis Junes, NP    Family History  Family History  Problem Relation Age of Onset  . Diabetes Mother   . Coronary artery disease Paternal Uncle   . Asthma Sister    Family Status  Relation Status Death Age  . Mother Alive   . Father Alive     Social History  Social History   Social History  . Marital Status: Married    Spouse Name: N/A  . Number of Children: N/A  . Years of Education: N/A   Occupational History  . Not on file.   Social History Main Topics  . Smoking status: Current Every Day Smoker -- 1.00 packs/day for 27 years    Types: Cigarettes    Last Attempt to Quit: 04/16/2015  . Smokeless tobacco: Former Systems developer    Types: Chew  . Alcohol Use: 0.0  oz/week    0 Standard drinks or equivalent per week     Comment: 5-6 beers (12 oz)  . Drug Use: No  . Sexual Activity: Yes   Other Topics Concern  . Not on file   Social History Narrative     Review of Systems General:  No chills, fever, night sweats or weight changes.  Cardiovascular:  No  dyspnea on exertion, edema, orthopnea, palpitations, paroxysmal nocturnal dyspnea. Dermatological: No rash, lesions/masses Respiratory: No cough, dyspnea Urologic: No hematuria, dysuria Abdominal:   No nausea, vomiting, diarrhea, bright red blood per rectum, melena, or hematemesis Neurologic:  No visual changes, wkns, changes in mental status. All other systems reviewed and are otherwise negative except as noted above.  Physical Exam  Blood pressure 127/89, pulse 84, temperature 98.2 F (36.8 C), temperature source Oral, resp. rate 19, height 5\' 9"  (1.753 m), weight 175 lb (79.379 kg), SpO2 99 %.  General: Pleasant, NAD Psych: Normal affect. Neuro: Alert and oriented X 3. Moves all extremities spontaneously. HEENT: Normal  Neck: Supple without bruits or JVD. Lungs:  Resp regular and unlabored, CTA. Heart: RRR no s3, s4, or  murmurs. Abdomen: Soft, non-tender, non-distended, BS + x 4.  Extremities: No clubbing, cyanosis or edema. DP/PT/Radials 2+ and equal bilaterally.  Labs  No results for input(s): CKTOTAL, CKMB, TROPONINI in the last 72 hours. Lab Results  Component Value Date   WBC 5.9 06/21/2015   HGB 16.3 06/21/2015   HCT 47.6 06/21/2015   MCV 92.2 06/21/2015   PLT 225 06/21/2015    Recent Labs Lab 06/21/15 1500  NA 140  K 4.3  CL 106  CO2 27  BUN 12  CREATININE 1.20  CALCIUM 9.4  GLUCOSE 108*   Lab Results  Component Value Date   CHOL 202* 03/22/2015   HDL 58.80 03/22/2015   LDLCALC 120* 03/22/2015   TRIG 116.0 03/22/2015     Radiology/Studies  EXAM: CHEST 2 VIEW  COMPARISON: 09/19/2014  FINDINGS: Normal heart size, mediastinal contours, and pulmonary vascularity.  Bronchitic changes with hyperinflation question COPD.  No pulmonary infiltrate, pleural effusion, or pneumothorax.  Question RIGHT nipple shadow.  Prior cervicothoracic fusion.  IMPRESSION: COPD changes with question RIGHT nipple shadow; repeat PA chest radiograph with nipple markers recommended to exclude pulmonary Nodule.  CT CHEST WITHOUT CONTRAST 06/06/2014  IMPRESSION: 1. No change in multiple tiny pulmonary nodules scattered throughout the lungs bilaterally. The majority of these have been stable for 2 years, and the other smaller lesions (less and 4 mm in size) have been stable for 1 year. At this time, these can all be considered benign and do not require further imaging followup. 2. Additional incidental findings, as above.  ECG  Vent. rate 98 BPM PR interval 138 ms QRS duration 88 ms QT/QTc 354/451 ms P-R-T axes 72 70 61  Echo Study Conclusions from 03/2012  - Left ventricle: The cavity size was normal. Wall thickness was normal. Systolic function was normal. The estimated ejection fraction was in the range of 55% to 60%. Wall motion was normal; there were no  regional wall motion abnormalities. Doppler parameters are consistent with abnormal left ventricular relaxation (grade 1 diastolic dysfunction). - Aortic valve: There was no stenosis. - Mitral valve: No significant regurgitation. - Right ventricle: The cavity size was normal. Systolic function was normal. - Pulmonary arteries: No complete TR doppler jet so unable to estimate PA systolic pressure. - Inferior vena cava: The vessel was normal in size; the respirophasic diameter changes  were in the normal range (= 50%); findings are consistent with normal central venous pressure. - Pericardium, extracardiac: There was no pericardial effusion. Impressions:  - Normal LV size and systolic function, EF 0000000. Normal RV size and systolic function. No significant valvular abnormalities. No pericardial effusion.  Coronary angiography from 07/2012: Coronary dominance: left  Left mainstem: Short, normal.  Left anterior descending (LAD): Normal.  Left circumflex (LCx): Dominant, normal.  Right coronary artery (RCA): small, nondominant, normal.  Left ventriculography: Left ventricular systolic function is normal, LVEF is estimated at 55-65%, there is no significant mitral regurgitation   Final Conclusions:  1. Normal coronary anatomy. 2. Normal LV function.  Recommendations: treat empirically for spasm.  Collier Salina [facility] 08/11/2012, 1:52 PM  ASSESSMENT AND PLAN  1. Chest pain. - Ongoing for the past 7 hours. No improvement on sublingual nitroglycerin x 2. Bending and holding breath make this matter. Laying flat makes it worse. Point-of-care troponin negative. EKG nonischemic.  - The patient has a possible diagnosis of coronary vasospasm. Patient had a normal cath in 03/2012. Non-STEMI s/p LHC was done again, again demonstrating normal coronaries.He is unable to tolerate Imdur but probably did not give it enough time. He has not tolerated Lopressor. Echo  03/2012 with LV EF of 55-60%, grade 1 DD.  - He is compliant with amlodipine. - He was given Toradol IV 30 mg in ED. Will admit him to step down unit and start on nitro drip. Serial troponin. If enzyme, positive start on IV heparin and plan for cath tomorrow. If rules out plan for outpatient Myoview. NPO aftermid night.   2. Ground glass infiltrates/multiple tiny pulmonary nodules - Last CT of chest was December 2015 which showed stable multiple tiny pulmonary nodule. No further workup recommended. - Chest x-ray12/28/2016  concerning for COPD, recommended repeat PA chest radiograph with nipple markers recommended to exclude pulmonary nodule. - Consider follow-up with pulmonary.  3. Ongoing tobacco abuse - He smoked one and half pack a day for 27 years and quit 2013. He again started smoking for the past 6 month (11/2014), currently smokes half a pack a day. Strongly encouraged cessation. Spent more than 10 minutes on education and importance of stop smoking.  4.hypertension - Stable and well controlled  5. HL - 03/22/2015: Cholesterol 202*; HDL 58.80; LDL Cholesterol 120*; Triglycerides 116.0; VLDL 23.2  - Not on any medication. If enzyme positive start statin.  Signed, Leanor Kail, PA-C 06/21/2015, 4:08 PM Pager [phone]  I have examined the patient and reviewed assessment and plan and discussed with patient.  Agree with above as stated.  H/o vasospasm.  Recurrent pain.  I stressed the importance of stopping smoking.  Will rule him out for MI.  If enzymes negative, can likely send home tomorrow.  Try IV nitro.  He has not tolerated Imdur in the past.  Continue CCB.   VARANASI,JAYADEEP S.

## 2015-06-21 NOTE — ED Notes (Signed)
Attempted Report x2. 

## 2015-06-21 NOTE — Care Management Obs Status (Signed)
[place] NOTIFICATION   Patient Details  Name: Richard Franco MRN: [id number] Date of Birth: 1965-10-31   Medicare Observation Status Notification Given:  Ernesta Amble, RN 06/21/2015, 8:24 PM

## 2015-06-21 NOTE — ED Notes (Signed)
Attempting to page Admitting MD for patient's pain

## 2015-06-21 NOTE — ED Notes (Signed)
Pt placed on the monitor and placed in room

## 2015-06-22 ENCOUNTER — Encounter (HOSPITAL_COMMUNITY): Payer: Self-pay | Admitting: Physician Assistant

## 2015-06-22 DIAGNOSIS — Z8679 Personal history of other diseases of the circulatory system: Secondary | ICD-10-CM | POA: Diagnosis not present

## 2015-06-22 DIAGNOSIS — R0781 Pleurodynia: Secondary | ICD-10-CM | POA: Diagnosis not present

## 2015-06-22 DIAGNOSIS — Z720 Tobacco use: Secondary | ICD-10-CM

## 2015-06-22 LAB — BASIC METABOLIC PANEL
Anion gap: 5 (ref 5–15)
BUN: 10 mg/dL (ref 6–20)
CO2: 27 mmol/L (ref 22–32)
Calcium: 9 mg/dL (ref 8.9–10.3)
Chloride: 106 mmol/L (ref 101–111)
Creatinine, Ser: 1.07 mg/dL (ref 0.61–1.24)
GFR calc Af Amer: >60 mL/min (ref >60)
GFR calc non Af Amer: >60 mL/min (ref >60)
Glucose, Bld: 102 mg/dL — ABNORMAL HIGH (ref 65–99)
Potassium: 3.8 mmol/L (ref 3.5–5.1)
Sodium: 138 mmol/L (ref 135–145)

## 2015-06-22 LAB — CBC
HCT: 43.5 % (ref 39.0–52.0)
Hemoglobin: 14.8 g/dL (ref 13.0–17.0)
MCH: 31.3 pg (ref 26.0–34.0)
MCHC: 34 g/dL (ref 30.0–36.0)
MCV: 92 fL (ref 78.0–100.0)
Platelets: 192 10*3/uL (ref 150–400)
RBC: 4.73 MIL/uL (ref 4.22–5.81)
RDW: 13.5 % (ref 11.5–15.5)
WBC: 7.3 10*3/uL (ref 4.0–10.5)

## 2015-06-22 LAB — TROPONIN I
Troponin I: <0.03 ng/mL (ref <0.031)
Troponin I: <0.03 ng/mL (ref <0.031)

## 2015-06-22 LAB — HEMOGLOBIN A1C
Hgb A1c MFr Bld: 5.7 % — ABNORMAL HIGH (ref 4.8–5.6)
Mean Plasma Glucose: 117 mg/dL

## 2015-06-22 LAB — MRSA PCR SCREENING: MRSA by PCR: NEGATIVE

## 2015-06-22 MED ORDER — IBUPROFEN 200 MG PO TABS: 400.0000 mg | ORAL_TABLET | Freq: Three times a day (TID) | ORAL | Status: DC

## 2015-06-22 MED ORDER — IBUPROFEN 400 MG PO TABS: 400.0000 mg | ORAL_TABLET | Freq: Three times a day (TID) | ORAL | Status: DC

## 2015-06-22 NOTE — Progress Notes (Signed)
SUBJECTIVE:  Still has some pain in his chest but it is worse with turning a certain way and worse with deep breathing.  OBJECTIVE:   Vitals:   Filed Vitals:   06/22/15 0349 06/22/15 0350 06/22/15 0742 06/22/15 0806  BP:  115/73 125/82   Pulse: 72  74   Temp: 97.6 F (36.4 C)   97.6 F (36.4 C)  TempSrc: Oral   Oral  Resp: 11  18   Height:      Weight: 177 lb 11.2 oz (80.604 kg)     SpO2: 95%  96%    I&O's:   Intake/Output Summary (Last 24 hours) at 06/22/15 0916 Last data filed at 06/22/15 [id number]  Gross per 24 hour  Intake    960 ml  Output   1500 ml  Net   -540 ml   TELEMETRY: Reviewed telemetry pt in NSR:     PHYSICAL EXAM General: Well developed, well nourished, in no acute distress Head:   Normal cephalic and atramatic  Lungs: Clear bilaterally to auscultation. Heart:   HRRR S1 S2  No JVD.   Abdomen: abdomen soft and non-tender Msk:  Back normal,  Normal strength and tone for age. Pain with pushing ion the sternum which is similar to the pain that he describes. Extremities:   No edema.   Neuro: Alert and oriented. Psych:  Normal affect, responds appropriately Skin: No rash   LABS: Basic Metabolic Panel:  Recent Labs  06/21/15 1500 06/21/15 2008 06/22/15 0722  NA 140  --  138  K 4.3  --  3.8  CL 106  --  106  CO2 27  --  27  GLUCOSE 108*  --  102*  BUN 12  --  10  CREATININE 1.20 1.11 1.07  CALCIUM 9.4  --  9.0   Liver Function Tests: No results for input(s): AST, ALT, ALKPHOS, BILITOT, PROT, ALBUMIN in the last 72 hours. No results for input(s): LIPASE, AMYLASE in the last 72 hours. CBC:  Recent Labs  06/21/15 2008 06/22/15 0722  WBC 5.3 7.3  HGB 14.9 14.8  HCT 43.9 43.5  MCV 91.8 92.0  PLT 198 192   Cardiac Enzymes:  Recent Labs  06/21/15 2008 06/22/15 0100 06/22/15 0722  TROPONINI <0.03 <0.03 <0.03   BNP: Invalid input(s): POCBNP D-Dimer:  Recent Labs  06/21/15 2008  DDIMER 0.61*   Hemoglobin A1C:  Recent Labs  06/21/15 2008  HGBA1C 5.7*   Fasting Lipid Panel: No results for input(s): CHOL, HDL, LDLCALC, TRIG, CHOLHDL, LDLDIRECT in the last 72 hours. Thyroid Function Tests:  Recent Labs  06/21/15 2008  TSH 1.945   Anemia Panel: No results for input(s): VITAMINB12, FOLATE, FERRITIN, TIBC, IRON, RETICCTPCT in the last 72 hours. Coag Panel:   Lab Results  Component Value Date   INR 0.99 08/10/2012   INR 1.1* 05/05/2012   INR 0.98 03/25/2012    RADIOLOGY: Dg Chest 1 View  06/21/2015  CLINICAL DATA:  For repeat with nipple markers EXAM: CHEST 1 VIEW COMPARISON:  Chest x-ray earlier today FINDINGS: Nodular density projecting over the right lung base corresponds to the nipple marker compatible with nipple shadow. No suspicious nodules. Lungs are clear. Heart is normal size. IMPRESSION: Previously seen nodular density represents nipple shadow. No acute findings. Electronically Signed   By: Rolm Baptise M.D.   On: 06/21/2015 18:25   Dg Chest 2 View  06/21/2015  CLINICAL DATA:  LEFT side cramping chest pain, no relief with nitroglycerin  at home, pain 8/10, recent dry cough, history coronary artery disease post MI and cardiac catheterization, smoker, fibromyalgia, GERD EXAM: CHEST  2 VIEW COMPARISON:  09/19/2014 FINDINGS: Normal heart size, mediastinal contours, and pulmonary vascularity. Bronchitic changes with hyperinflation question COPD. No pulmonary infiltrate, pleural effusion, or pneumothorax. Question RIGHT nipple shadow. Prior cervicothoracic fusion. IMPRESSION: COPD changes with question RIGHT nipple shadow; repeat PA chest radiograph with nipple markers recommended to exclude pulmonary nodule. Electronically Signed   By: Lavonia Dana M.D.   On: 06/21/2015 16:29      ASSESSMENT: PLAN:   1) Pleuritic chest pain with negative troponin.  Treat with Ibuprofen 400 mg TID for 3 days.    2) He needs to stop smoking.  3) He needs f/u with pulmonary regarding his nodules and ? COPD.  Slightly  positive D-dimer but no other signs or sx of PE.  No SHOB.  Would not pursue CT at this time as the positive predictive value of D-dimer is poor.  Hold off on Imdur since he had problems with headaches in teh past.   Jettie Booze, MD  06/22/2015  9:16 AM

## 2015-06-22 NOTE — Progress Notes (Signed)
Discharge teaching and instructions reviewed. Pt educated on smoking cessation. VSS. No further questions. Pt discharging home with father.

## 2015-06-22 NOTE — Plan of Care (Signed)
Problem: Consults Goal: Chest Pain Patient Education (See Patient Education module for education specifics.) Outcome: Progressing Patient educated to notify staff immediately of chest pain. Goal: Tobacco Cessation referral if indicated Outcome: Progressing Patient encouraged to stop smoking.  Problem: Phase I Progression Outcomes Goal: Hemodynamically stable Outcome: Progressing Patient vital signs have remained within normal limits while on nitroglycerin gtt and receiving intermittent doses of IV pain medicines. Goal: Aspirin unless contraindicated Outcome: Completed/Met Date Met:  06/22/15 Aspirin has been administered to patient. Goal: Voiding-avoid urinary catheter unless indicated Outcome: Progressing Patient able to use urinal independently to facilitate with accurate intake/output calculations.

## 2015-06-22 NOTE — Discharge Summary (Signed)
Discharge Summary   Patient ID: Richard Franco,  MRN: [id number], DOB/AGE: [birth year] 49 y.o.  Admit date: 06/21/2015 Discharge date: 06/22/2015  Primary Care Provider: Redge Gainer Primary Cardiologist: Dr. Aundra Dubin  Discharge Diagnoses    Principal Problem:   Pleuritic chest pain Active Problems:   Fibromyalgia   Tobacco abuse   History of coronary vasospasm   Allergies Allergies  Allergen Reactions  . Midodrine Hcl Swelling    Tongue swelling  . Sulfonamide Derivatives Rash         Diagnostic Studies/Procedures    CXR - IMPRESSION: Previously seen nodular density represents nipple shadow. No acute findings. _____________   Hospital Course  Richard Franco is a 49 y/o M with history of fibromyalgia, ground-glass infiltration, pulmonary nodules, recurrent tobacco abuse, habitual alcohol use (3-4 beers/day), GERD, anxiety and normal coronaries on cath x 2 who came to [facility] ED for evaluation of chest pain. Per history, cath 2013 showing no significant coronary artery disease in a setting of pleuritic chest pain. That time CTA of chest showed diffuse groundglass infiltrates. Follow-up transbronchial biopsy with no definite etiology of lung finding- possible organizing pneumonia. He was referred to pulmonary at [facility] and no significant etiology was found. Last CT of chest was December 2015 which showed stable multiple tiny pulmonary nodule. No further workup recommended. He was readmitted in 2/14 with chest pain and had troponin elevation to 4.42. LHC was done again, again demonstrating normal coronaries. Due to concern for coronary vasospasm, he was started on Imdur but was unable to tolerate due to headache so diltiazem CD was begun and titrated up to 360 mg daily. This was transitioned eventually to amlodipine. He also had polyarthralgias and was referred to rheumatology. Autoimmune workup was negative. He was seen by pulmonary and had a CPX, suggesting  deconditioning as a cause for his exertional dyspnea.   With regard to this admission, he presented with complaints of CP. On the day of admission around 9 AM he developed a sudden onset of sharp/tight left upper chest pain that intermittently radiated to substernal area. He also complained of occasional left arm numbness. Holding his breath helped. Bending over made it better and laying flat made it worse. He took sublingual nitroglycerin x 2 without improvement so went to the ER. EKG was without acute abnormality. POC troponin negative despite 7 hours of pain. CXR showed no acute findings. He was treated with Toradol and admitted for MI r/o. Tobacco cessation was recommended. D-dimer was marginally elevated at 0.61 but he was not tachycardic, tachypneic or hypoxic and Dr. Irish Lack did not feel further evaluation was necessary. Troponins remained neg x 5. His pain was felt pleuritic in etiology. He was not started on Imdur due to h/o headaches with this. Dr. Irish Lack recommended ibuprofen 400mg  TID x 3 days. Dr. Irish Lack has seen and examined the patient today and feels he is stable for discharge. He did not feel nuclear stress testing was indicated at this time.  _____________  Discharge Vitals Blood pressure 125/82, pulse 74, temperature 97.6 F (36.4 C), temperature source Oral, resp. rate 18, height 5\' 9"  (1.753 m), weight 177 lb 11.2 oz (80.604 kg), SpO2 96 %.  Filed Weights   06/21/15 1446 06/21/15 1900 06/22/15 0349  Weight: 175 lb (79.379 kg) 178 lb 6.4 oz (80.922 kg) 177 lb 11.2 oz (80.604 kg)   _____________  Labs     CBC  Recent Labs  06/21/15 2008 06/22/15 0722  WBC 5.3 7.3  HGB 14.9 14.8  HCT 43.9 43.5  MCV 91.8 92.0  PLT 198 [id number]   Basic Metabolic Panel  Recent Labs  06/21/15 1500 06/21/15 2008 06/22/15 0722  NA 140  --  138  K 4.3  --  3.8  CL 106  --  106  CO2 27  --  27  GLUCOSE 108*  --  102*  BUN 12  --  10  CREATININE 1.20 1.11 1.07  CALCIUM 9.4  --   9.0   Cardiac Enzymes  Recent Labs  06/21/15 2008 06/22/15 0100 06/22/15 0722  TROPONINI <0.03 <0.03 <0.03   D-Dimer  Recent Labs  06/21/15 2008  DDIMER 0.61*   Hemoglobin A1C  Recent Labs  06/21/15 2008  HGBA1C 5.7*   Thyroid Function Tests  Recent Labs  06/21/15 2008  TSH 1.945   _____________  Disposition   Pt is being discharged home today in good condition. _____________  Follow-up Plans & Appointments    Follow-up Information    Follow up with Richard Merle, NP.   Specialties:  Nurse Practitioner, Interventional Cardiology, Cardiology, Radiology   Why:  [facility] - 07/04/15 at [place] information:   [place] [place] [address] [phone]      Discharge Instructions    Diet - low sodium heart healthy    Complete by:  As directed      Increase activity slowly    Complete by:  As directed   Your doctor wants you to take ibuprofen 400mg  three times a day (every 8 hours) for 3 days. Please take this with food. A prescription was sent into your pharmacy.           Discharge Medications   Current Discharge Medication List    CONTINUE these medications which have CHANGED   Details  ibuprofen (ADVIL,MOTRIN) 400 MG tablet Take 1 tablet (400 mg total) by mouth 3 (three) times daily. For 3 days. Take with food. Qty: 9 tablet, Refills: 0      CONTINUE these medications which have NOT CHANGED   Details  amLODipine (NORVASC) 5 MG tablet Take 1 tablet (5 mg total) by mouth daily.    aspirin EC 81 MG EC tablet Take 1 tablet (81 mg total) by mouth daily.    busPIRone (BUSPAR) 10 MG tablet Take 10 mg by mouth 2 (two) times daily.    FLUoxetine (PROZAC) 20 MG capsule Take 40 mg by mouth daily.     lamoTRIgine (LAMICTAL) 100 MG tablet Take 100 mg by mouth 2 (two) times daily.    lansoprazole (PREVACID) 30 MG capsule Take 1 capsule (30 mg total) by mouth 2 (two) times daily.     nitroGLYCERIN (NITROSTAT) 0.4 MG  SL tablet DISSOLVE ONE TABLET UNDER THE TONGUE EVERY 5 MINUTES AS NEEDED FOR CHEST PAIN.  DO NOT EXCEED A TOTAL OF 3 DOSES IN 15 MINUTES        _____________   Outstanding Labs/Studies   N/A  Duration of Discharge Encounter   Greater than 30 minutes including physician time.  Signed, Melina Copa PA-C 06/22/2015, 10:23 AM  I have examined the patient and reviewed assessment and plan and discussed with patient.  Agree with above as stated.  Patient has ruled out.  His pain is atypical and sounds more musculoskeletal.  He has pain with turning his upper body and with deep breathing.  Treat with ibuprofen for a few days.  He needs pulmonary f/u as well  as he is concerned about his lung disease and there is a question of COPD on xray.  He needs to stop smoking.  Lastacia Solum S.

## 2015-06-30 ENCOUNTER — Telehealth: Payer: Self-pay | Admitting: Gastroenterology

## 2015-06-30 MED ORDER — LANSOPRAZOLE 30 MG PO CPDR: 30.0000 mg | DELAYED_RELEASE_CAPSULE | Freq: Two times a day (BID) | ORAL | Status: DC

## 2015-06-30 NOTE — Telephone Encounter (Signed)
Informed patient that med sent

## 2015-07-04 ENCOUNTER — Ambulatory Visit: Payer: Medicare Other | Admitting: Nurse Practitioner

## 2015-07-06 ENCOUNTER — Encounter: Payer: Self-pay | Admitting: Nurse Practitioner

## 2015-08-11 ENCOUNTER — Telehealth: Payer: Self-pay | Admitting: Gastroenterology

## 2015-08-11 ENCOUNTER — Encounter: Payer: Self-pay | Admitting: Gastroenterology

## 2015-08-11 MED ORDER — ESOMEPRAZOLE MAGNESIUM 40 MG PO CPDR: 40.0000 mg | DELAYED_RELEASE_CAPSULE | Freq: Two times a day (BID) | ORAL | Status: DC

## 2015-08-11 NOTE — Telephone Encounter (Signed)
Submitted prior authorization waiting on response , they want patient to have tried and failed Nexium. Sent in new script for twice a day nexium called and informed pharmacy

## 2015-08-14 ENCOUNTER — Telehealth: Payer: Self-pay | Admitting: *Deleted

## 2015-08-14 NOTE — Telephone Encounter (Signed)
Patient approved for Lansoprazole

## 2015-08-23 ENCOUNTER — Encounter: Payer: Self-pay | Admitting: Gastroenterology

## 2015-08-23 ENCOUNTER — Ambulatory Visit (INDEPENDENT_AMBULATORY_CARE_PROVIDER_SITE_OTHER): Payer: Medicare Other | Admitting: Gastroenterology

## 2015-08-23 VITALS — BP 102/74 | HR 92 | Ht 69.0 in | Wt 177.0 lb

## 2015-08-23 DIAGNOSIS — R112 Nausea with vomiting, unspecified: Secondary | ICD-10-CM | POA: Diagnosis not present

## 2015-08-23 DIAGNOSIS — R1013 Epigastric pain: Secondary | ICD-10-CM

## 2015-08-23 DIAGNOSIS — K219 Gastro-esophageal reflux disease without esophagitis: Secondary | ICD-10-CM | POA: Diagnosis not present

## 2015-08-23 NOTE — Patient Instructions (Signed)
We have given you a Gastroparesis diet today.   Please follow up in one year .  Call if you have any questions about medication effectiveness.

## 2015-09-18 ENCOUNTER — Encounter: Payer: Self-pay | Admitting: Family Medicine

## 2015-09-18 ENCOUNTER — Ambulatory Visit (INDEPENDENT_AMBULATORY_CARE_PROVIDER_SITE_OTHER): Payer: Medicare Other | Admitting: Family Medicine

## 2015-09-18 VITALS — BP 117/76 | HR 91 | Temp 97.0°F | Ht 69.0 in | Wt 178.6 lb

## 2015-09-18 DIAGNOSIS — R1013 Epigastric pain: Secondary | ICD-10-CM | POA: Diagnosis not present

## 2015-09-18 NOTE — Patient Instructions (Signed)
Great to meet you!  We will call with labs within 1 week, we will try reglan if the labs are negative.   Abdominal Pain, Adult Many things can cause abdominal pain. Usually, abdominal pain is not caused by a disease and will improve without treatment. It can often be observed and treated at home. Your health care provider will do a physical exam and possibly order blood tests and X-rays to help determine the seriousness of your pain. However, in many cases, more time must pass before a clear cause of the pain can be found. Before that point, your health care provider may not know if you need more testing or further treatment. HOME CARE INSTRUCTIONS Monitor your abdominal pain for any changes. The following actions may help to alleviate any discomfort you are experiencing:  Only take over-the-counter or prescription medicines as directed by your health care provider.  Do not take laxatives unless directed to do so by your health care provider.  Try a clear liquid diet (broth, tea, or water) as directed by your health care provider. Slowly move to a bland diet as tolerated. SEEK MEDICAL CARE IF:  You have unexplained abdominal pain.  You have abdominal pain associated with nausea or diarrhea.  You have pain when you urinate or have a bowel movement.  You experience abdominal pain that wakes you in the night.  You have abdominal pain that is worsened or improved by eating food.  You have abdominal pain that is worsened with eating fatty foods.  You have a fever. SEEK IMMEDIATE MEDICAL CARE IF:  Your pain does not go away within 2 hours.  You keep throwing up (vomiting).  Your pain is felt only in portions of the abdomen, such as the right side or the left lower portion of the abdomen.  You pass bloody or black tarry stools. MAKE SURE YOU:  Understand these instructions.  Will watch your condition.  Will get help right away if you are not doing well or get worse.   This  information is not intended to replace advice given to you by your health care provider. Make sure you discuss any questions you have with your health care provider.   Document Released: 03/20/2005 Document Revised: 03/01/2015 Document Reviewed: 02/17/2013 Elsevier Interactive Patient Education [employer].

## 2015-09-18 NOTE — Progress Notes (Signed)
   HPI  Patient presents today abdominal pain and nausea.  Patient's Wednesdays and loss of appetite, nausea, abdominal pain over the last 3 months. He describes it as a epigastric burning type pain with no radiation. It increases with eating, he has small amounts of vomit after eating. He is drinking at least a sixpack of beer a day, commonly also drinks liquor as well. No diarrhea, normal stools. States he goes 1-2 days without eating regularly because of the discomfort.  PMH: Smoking status noted ROS: Per HPI  Objective: BP 117/76 mmHg  Pulse 91  Temp(Src) 97 F (36.1 C) (Oral)  Ht _0  (1.753 m)  Wt 178 lb 9.6 oz (81.012 kg)  BMI 26.36 kg/m2 Gen: NAD, alert, cooperative with exam HEENT: NCAT CV: RRR, good S1/S2, no murmur Resp: CTABL, no wheezes, non-labored Abd: Soft, mild tenderness to palpation in the epigastric area left upper quadrant, and left lower quadrant, positive bowel sounds Ext: No edema, warm Neuro: Alert and oriented, No gross deficits  Assessment and plan:  # Abdominal pain, epigastric Given drinking on concern about chronic smoldering pancreatitis Some concern for gastroparesis by GI, treatment with metoclopramide if labs are negative. Return to clinic in one month for follow-up, sooner if symptoms worsen   Orders Placed This Encounter  Procedures  . CMP14+EGFR  . Lipase  . CBC with Differential     Laroy Apple, MD [facility] 09/18/2015, 8:39 AM

## 2015-09-19 ENCOUNTER — Other Ambulatory Visit: Payer: Self-pay | Admitting: Family Medicine

## 2015-09-19 ENCOUNTER — Other Ambulatory Visit: Payer: Self-pay | Admitting: *Deleted

## 2015-09-19 LAB — CBC WITH DIFFERENTIAL/PLATELET
Basophils Absolute: 0 10*3/uL (ref 0.0–0.2)
Basos: 0 %
EOS (ABSOLUTE): 0.2 10*3/uL (ref 0.0–0.4)
Eos: 3 %
Hematocrit: 44.3 % (ref 37.5–51.0)
Hemoglobin: 15 g/dL (ref 12.6–17.7)
Immature Grans (Abs): 0 10*3/uL (ref 0.0–0.1)
Immature Granulocytes: 1 %
Lymphocytes Absolute: 1.3 10*3/uL (ref 0.7–3.1)
Lymphs: 25 %
MCH: 31.4 pg (ref 26.6–33.0)
MCHC: 33.9 g/dL (ref 31.5–35.7)
MCV: 93 fL (ref 79–97)
Monocytes Absolute: 0.4 10*3/uL (ref 0.1–0.9)
Monocytes: 8 %
Neutrophils Absolute: 3.2 10*3/uL (ref 1.4–7.0)
Neutrophils: 63 %
Platelets: 235 10*3/uL (ref 150–379)
RBC: 4.77 x10E6/uL (ref 4.14–5.80)
RDW: 14.1 % (ref 12.3–15.4)
WBC: 5.1 10*3/uL (ref 3.4–10.8)

## 2015-09-19 LAB — CMP14+EGFR
ALT: 19 IU/L (ref 0–44)
AST: 17 IU/L (ref 0–40)
Albumin/Globulin Ratio: 2 (ref 1.2–2.2)
Albumin: 4.2 g/dL (ref 3.5–5.5)
Alkaline Phosphatase: 88 IU/L (ref 39–117)
BUN/Creatinine Ratio: 9 (ref 9–20)
BUN: 11 mg/dL (ref 6–24)
Bilirubin Total: 0.4 mg/dL (ref 0.0–1.2)
CO2: 27 mmol/L (ref 18–29)
Calcium: 9.4 mg/dL (ref 8.7–10.2)
Chloride: 101 mmol/L (ref 96–106)
Creatinine, Ser: 1.18 mg/dL (ref 0.76–1.27)
GFR calc Af Amer: 83 mL/min/{1.73_m2} (ref >59)
GFR calc non Af Amer: 72 mL/min/{1.73_m2} (ref >59)
Globulin, Total: 2.1 g/dL (ref 1.5–4.5)
Glucose: 93 mg/dL (ref 65–99)
Potassium: 4.5 mmol/L (ref 3.5–5.2)
Sodium: 142 mmol/L (ref 134–144)
Total Protein: 6.3 g/dL (ref 6.0–8.5)

## 2015-09-19 LAB — LIPASE: Lipase: 29 U/L (ref 0–59)

## 2015-09-19 MED ORDER — METOCLOPRAMIDE HCL 5 MG PO TABS: 5.0000 mg | ORAL_TABLET | Freq: Three times a day (TID) | ORAL | Status: DC

## 2015-09-20 ENCOUNTER — Telehealth: Payer: Self-pay | Admitting: Family Medicine

## 2015-09-20 NOTE — Telephone Encounter (Signed)
Patient aware of lab results.

## 2015-10-09 ENCOUNTER — Telehealth: Payer: Self-pay | Admitting: Cardiology

## 2015-10-09 ENCOUNTER — Other Ambulatory Visit: Payer: Self-pay | Admitting: *Deleted

## 2015-10-09 MED ORDER — NITROGLYCERIN 0.4 MG SL SUBL: SUBLINGUAL_TABLET | SUBLINGUAL | Status: DC

## 2015-10-09 NOTE — Telephone Encounter (Signed)
Pt calling to request new refill of Nitro to [facility]

## 2015-10-11 ENCOUNTER — Telehealth: Payer: Self-pay | Admitting: Gastroenterology

## 2015-10-12 NOTE — Telephone Encounter (Signed)
patientcalls with complaints of early satiety and poor appetite. He does not eat 3 meals a day because he is not hungry. PCP has him on Reglan. States when he takes this he thinks is able to eat a little more, but still has no appetite. He complains he weighs the least he has weighed in many years and he is not trying to lose weight. Lots of belching. What do you recommend?

## 2015-10-12 NOTE — Progress Notes (Signed)
Richard Franco    [id number]    1966-03-10  Primary Care Physician:MOORE, Elenore Rota, MD  Referring Physician: Chipper Herb, MD [address] [place], [place] [address]  Chief complaint: Epigastric pain, nausea  HPI:  23 yr M with h/o NSTEMI, pericarditis and pulmonary nodules here for follow up visit with c/o epigastric pain, and nausea. He is currently on PPI twice daily. He has intermittent dysphagia. He had neck trauma as a Comptroller. EGD unremarkable, empiric dilation with maloney to 35mm and colonoscopy no significant pathology, normal biopsies in 2013. He also has decreased appetite and feels full after eating small amount. Vomits occasionally. No constipation or diarrhea Denies any melena or bright red blood per rectum. Weight stable    Outpatient Encounter Prescriptions as of 08/23/2015  Medication Sig  . amLODipine (NORVASC) 5 MG tablet Take 1 tablet (5 mg total) by mouth daily.  Marland Kitchen aspirin EC 81 MG EC tablet Take 1 tablet (81 mg total) by mouth daily.  . busPIRone (BUSPAR) 10 MG tablet Take 10 mg by mouth 2 (two) times daily.  Marland Kitchen esomeprazole (NEXIUM) 40 MG capsule Take 1 capsule (40 mg total) by mouth 2 (two) times daily before a meal.  . FLUoxetine (PROZAC) 20 MG capsule Take 40 mg by mouth daily.   Marland Kitchen lamoTRIgine (LAMICTAL) 100 MG tablet Take 100 mg by mouth 2 (two) times daily.  . [DISCONTINUED] nitroGLYCERIN (NITROSTAT) 0.4 MG SL tablet DISSOLVE ONE TABLET UNDER THE TONGUE EVERY 5 MINUTES AS NEEDED FOR CHEST PAIN.  DO NOT EXCEED A TOTAL OF 3 DOSES IN 15 MINUTES  . lansoprazole (PREVACID) 30 MG capsule Take 1 capsule (30 mg total) by mouth 2 (two) times daily.  . [DISCONTINUED] ibuprofen (ADVIL,MOTRIN) 400 MG tablet Take 1 tablet (400 mg total) by mouth 3 (three) times daily. For 3 days. Take with food.   No facility-administered encounter medications on file as of 08/23/2015.    Allergies as of 08/23/2015 - Review Complete 08/23/2015  Allergen Reaction Noted    . Midodrine hcl Swelling   . Sulfonamide derivatives Rash     Past Medical History  Diagnosis Date  . Fibromyalgia   . Depression   . GERD (gastroesophageal reflux disease)   . Testosterone deficiency   . Anxiety   . Abnormal chest CT 03/2012    a. 2013: Scattered patchy ground glass opacities and pulmonary nodules. Transbronchial biopsy with no definite etiology of lung finding- possible organizing pneumonia. b. F/u CT 05/2014: stable multiple tiny pulm nodules, no further w/u recommended per notes.   . Tobacco abuse   . Complication of anesthesia     " difficult to urinate after "  . NSTEMI (non-ST elevated myocardial infarction) ([place]) 07/2012    a. Normal cath 03/2012. b. 07/2012: troponin 4, normal cors, ?vasospasm. Did not tolerate Imdur due to headache. On amlodipine.  . Migraine headache     "none lately; I've had alot in my life" (06/21/2015)  . Arthritis   . Chronic midline posterior neck pain   . Esophageal stricture     Past Surgical History  Procedure Laterality Date  . Posterior cervical fusion/foraminotomy  05/09/2011    Procedure: POSTERIOR CERVICAL FUSION/FORAMINOTOMY LEVEL 1;  Surgeon: Hosie Spangle;  Location: [facility];  Service: Neurosurgery;  Laterality: N/A;  C4/5 posterior arthrodesis with instrumentation   . Cystectomy    . Ankle fusion Left 2006  . Video bronchoscopy  05/06/2012  Procedure: VIDEO BRONCHOSCOPY WITH FLUORO;  Surgeon: Jyl Heinz, MD;  Location: [facility];  Service: Cardiopulmonary;  Laterality: Bilateral;  . Left heart catheterization with coronary angiogram N/A 03/25/2012    Procedure: LEFT HEART CATHETERIZATION WITH CORONARY ANGIOGRAM;  Surgeon: Burnell Blanks, MD;  Location: [facility] CATH LAB;  Service: Cardiovascular;  Laterality: N/A;  . Left heart catheterization with coronary angiogram N/A 08/11/2012    Procedure: LEFT HEART CATHETERIZATION WITH CORONARY ANGIOGRAM;  Surgeon: Peter M Martinique, MD;  Location: [facility] CATH LAB;   Service: Cardiovascular;  Laterality: N/A;  . Cervical fusion  X7 "last one 04/2011"  . Back surgery    . Fracture surgery      Family History  Problem Relation Age of Onset  . Diabetes Mother   . Coronary artery disease Paternal Uncle   . Asthma Sister     Social History   Social History  . Marital Status: Married    Spouse Name: N/A  . Number of Children: N/A  . Years of Education: N/A   Occupational History  . Not on file.   Social History Main Topics  . Smoking status: Current Every Day Smoker -- 0.75 packs/day for 27.5 years    Types: Cigarettes  . Smokeless tobacco: Former Systems developer    Types: Chew  . Alcohol Use: 12.6 oz/week    21 Cans of beer, 0 Standard drinks or equivalent per week     Comment: 06/21/2015 "3 beers/day lately; more lately cause of being separated"   . Drug Use: No  . Sexual Activity: Yes   Other Topics Concern  . Not on file   Social History Narrative      Review of systems: Review of Systems  Constitutional: Negative for fever and chills.  HENT: Negative.   Eyes: Negative for blurred vision.  Respiratory: Negative for cough, shortness of breath and wheezing.   Cardiovascular: Negative for chest pain and palpitations.  Gastrointestinal: as per HPI Genitourinary: Negative for dysuria, urgency, frequency and hematuria.  Musculoskeletal: Negative for myalgias, back pain and joint pain.  Skin: Negative for itching and rash.  Neurological: Negative for dizziness, tremors, focal weakness, seizures and loss of consciousness.  Endo/Heme/Allergies: Negative for environmental allergies.  Psychiatric/Behavioral: Negative for depression, suicidal ideas and hallucinations.  All other systems reviewed and are negative.   Physical Exam: Filed Vitals:   08/23/15 1522  BP: 102/74  Pulse: 92   Gen:      No acute distress HEENT:  EOMI, sclera anicteric Neck:     No masses; no thyromegaly Lungs:    Clear to auscultation bilaterally; normal  respiratory effort CV:         Regular rate and rhythm; no murmurs Abd:      + bowel sounds; soft, non-tender; no palpable masses, no distension Ext:    No edema; adequate peripheral perfusion Skin:      Warm and dry; no rash Neuro: alert and oriented x 3 Psych: normal mood and affect  Data Reviewed:  Reviewed chart in epic   Assessment and Plan/Recommendations: 56 yr M with h/o pericarditis, pulmonary nodules here with c/o epigastric pain, post prandial fullness and nausea. EGD and colonoscopy in 2013 did not show any significant pathology. Possible gastroparesis, will do a trial of Gastroparesis diet, small frequent meals, avoid fiber and high fat diet Continue PPI twice daily Anti reflux measures, no meals 3 hours before bedtime and sleep with head end elevation  K. Denzil Magnuson , MD [phone] Mon-Fri 8a-5p  [id number] after 5p, weekends, holidays

## 2015-10-12 NOTE — Telephone Encounter (Signed)
Patient agrees to an appointment tomorrow.

## 2015-10-13 ENCOUNTER — Encounter: Payer: Self-pay | Admitting: *Deleted

## 2015-10-13 ENCOUNTER — Ambulatory Visit (INDEPENDENT_AMBULATORY_CARE_PROVIDER_SITE_OTHER): Payer: Medicare Other | Admitting: Gastroenterology

## 2015-10-13 ENCOUNTER — Other Ambulatory Visit (INDEPENDENT_AMBULATORY_CARE_PROVIDER_SITE_OTHER): Payer: Medicare Other

## 2015-10-13 ENCOUNTER — Encounter: Payer: Self-pay | Admitting: Gastroenterology

## 2015-10-13 VITALS — BP 102/78 | HR 88 | Ht 68.11 in | Wt 177.4 lb

## 2015-10-13 DIAGNOSIS — R1013 Epigastric pain: Secondary | ICD-10-CM

## 2015-10-13 DIAGNOSIS — R112 Nausea with vomiting, unspecified: Secondary | ICD-10-CM | POA: Diagnosis not present

## 2015-10-13 DIAGNOSIS — R634 Abnormal weight loss: Secondary | ICD-10-CM

## 2015-10-13 LAB — TSH: TSH: 1.3 u[IU]/mL (ref 0.35–4.50)

## 2015-10-13 NOTE — Patient Instructions (Signed)
You have been scheduled for an abdominal ultrasound at [facility] (1st floor of hospital) on 10/23/2015 at 8:30 Please arrive 15 minutes prior to your appointment for registration. Make certain not to have anything to eat or drink 6 hours prior to your appointment. Should you need to reschedule your appointment, please contact radiology at [phone]. This test typically takes about 30 minutes to perform.      You have been scheduled for a gastric emptying scan at [facility] on 10/23/2015 at 9:30am. Please arrive at least 15 minutes prior to your appointment for registration. Please make certain not to have anything to eat or drink after midnight the night before your test. Hold all stomach medications (ex: Zofran, phenergan, Reglan) 48 hours prior to your test. If you need to reschedule your appointment, please contact radiology scheduling at [phone]. _____________________________________________________________________ A gastric-emptying study measures how long it takes for food to move through your stomach. There are several ways to measure stomach emptying. In the most common test, you eat food that contains a small amount of radioactive material. A scanner that detects the movement of the radioactive material is placed over your abdomen to monitor the rate at which food leaves your stomach. This test normally takes about 2 hours to complete. _____________________________________________________________________   Hold Reglan Avoid Soda and artificial sweeteners Gastroparesis diet given

## 2015-10-13 NOTE — Progress Notes (Signed)
Richard Franco    [id number]    12/24/1965  Primary Care Physician:Franco, Richard Rota, MD  Referring Physician: Chipper Herb, MD [address] [place], [place] [address]  Chief complaint:  Loss of appetite, weight loss  HPI: 50 year old male with history of GERD, pericarditis here for follow-up visit with complaints of persistent loss of appetite, and weight loss 10-14 pounds in the last 3 months. He also feels fall after eating a small meal associated with nausea. He is taking Reglan prescribed by his primary care Dr. [name] and notes some improvement in appetite but continues to have nausea and has not gained any weight. Denies any vomiting undigested food but he does vomit intermittently small amount of bile. No blood or hematemesis. He continues to have intermittent dysphagia to both solids and liquids and no history of food impactions.   Outpatient Encounter Prescriptions as of 10/13/2015  Medication Sig  . amLODipine (NORVASC) 5 MG tablet Take 1 tablet (5 mg total) by mouth daily.  Marland Kitchen aspirin EC 81 MG EC tablet Take 1 tablet (81 mg total) by mouth daily.  . busPIRone (BUSPAR) 10 MG tablet Take 10 mg by mouth 2 (two) times daily.  Marland Kitchen esomeprazole (NEXIUM) 40 MG capsule Take 1 capsule (40 mg total) by mouth 2 (two) times daily before a meal.  . FLUoxetine (PROZAC) 20 MG capsule Take 40 mg by mouth daily.   Marland Kitchen lamoTRIgine (LAMICTAL) 100 MG tablet Take 100 mg by mouth 2 (two) times daily.  . metoCLOPramide (REGLAN) 5 MG tablet Take 1 tablet (5 mg total) by mouth 3 (three) times daily before meals.  . nitroGLYCERIN (NITROSTAT) 0.4 MG SL tablet DISSOLVE ONE TABLET UNDER THE TONGUE EVERY 5 MINUTES AS NEEDED FOR CHEST PAIN.  DO NOT EXCEED A TOTAL OF 3 DOSES IN 15 MINUTES (Patient not taking: Reported on 10/13/2015)  . [DISCONTINUED] lansoprazole (PREVACID) 30 MG capsule Take 1 capsule (30 mg total) by mouth 2 (two) times daily.   No facility-administered encounter medications on  file as of 10/13/2015.    Allergies as of 10/13/2015 - Review Complete 10/13/2015  Allergen Reaction Noted  . Midodrine hcl Swelling   . Sulfonamide derivatives Rash     Past Medical History  Diagnosis Date  . Fibromyalgia   . Depression   . GERD (gastroesophageal reflux disease)   . Testosterone deficiency   . Anxiety   . Abnormal chest CT 03/2012    a. 2013: Scattered patchy ground glass opacities and pulmonary nodules. Transbronchial biopsy with no definite etiology of lung finding- possible organizing pneumonia. b. F/u CT 05/2014: stable multiple tiny pulm nodules, no further w/u recommended per notes.   . Tobacco abuse   . Complication of anesthesia     " difficult to urinate after "  . NSTEMI (non-ST elevated myocardial infarction) ([facility]) 07/2012    a. Normal cath 03/2012. b. 07/2012: troponin 4, normal cors, ?vasospasm. Did not tolerate Imdur due to headache. On amlodipine.  . Migraine headache     "none lately; I've had alot in my life" (06/21/2015)  . Arthritis   . Chronic midline posterior neck pain   . Esophageal stricture     Past Surgical History  Procedure Laterality Date  . Posterior cervical fusion/foraminotomy  05/09/2011    Procedure: POSTERIOR CERVICAL FUSION/FORAMINOTOMY LEVEL 1;  Surgeon: Hosie Spangle;  Location: [facility];  Service: Neurosurgery;  Laterality: N/A;  C4/5 posterior arthrodesis with instrumentation   .  Cystectomy    . Ankle fusion Left 2006  . Video bronchoscopy  05/06/2012    Procedure: VIDEO BRONCHOSCOPY WITH FLUORO;  Surgeon: Jyl Heinz, MD;  Location: [facility];  Service: Cardiopulmonary;  Laterality: Bilateral;  . Left heart catheterization with coronary angiogram N/A 03/25/2012    Procedure: LEFT HEART CATHETERIZATION WITH CORONARY ANGIOGRAM;  Surgeon: Burnell Blanks, MD;  Location: [facility];  Service: Cardiovascular;  Laterality: N/A;  . Left heart catheterization with coronary angiogram N/A 08/11/2012     Procedure: LEFT HEART CATHETERIZATION WITH CORONARY ANGIOGRAM;  Surgeon: Peter M Martinique, MD;  Location: [facility] CATH LAB;  Service: Cardiovascular;  Laterality: N/A;  . Cervical fusion  X7 "last one 04/2011"  . Back surgery    . Fracture surgery      Family History  Problem Relation Age of Onset  . Diabetes Mother   . Coronary artery disease Paternal Uncle   . Asthma Sister     Social History   Social History  . Marital Status: Married    Spouse Name: N/A  . Number of Children: N/A  . Years of Education: N/A   Occupational History  . Not on file.   Social History Main Topics  . Smoking status: Current Every Day Smoker -- 0.75 packs/day for 27.5 years    Types: Cigarettes  . Smokeless tobacco: Former Systems developer    Types: Chew  . Alcohol Use: 12.6 oz/week    21 Cans of beer, 0 Standard drinks or equivalent per week     Comment: 06/21/2015 "3 beers/day lately; more lately cause of being separated"   . Drug Use: No  . Sexual Activity: Yes   Other Topics Concern  . Not on file   Social History Narrative      Review of systems: Review of Systems  Constitutional: Negative for fever and chills.  HENT: Negative.   Eyes: Negative for blurred vision.  Respiratory: Negative for cough, shortness of breath and wheezing.   Cardiovascular: Negative for chest pain and palpitations.  Gastrointestinal: as per HPI Genitourinary: Negative for dysuria, urgency, frequency and hematuria.  Musculoskeletal: Negative for myalgias, back pain and joint pain.  Skin: Negative for itching and rash.  Neurological: Negative for dizziness, tremors, focal weakness, seizures and loss of consciousness.  Endo/Heme/Allergies: Negative for environmental allergies.  Psychiatric/Behavioral: Negative for depression, suicidal ideas and hallucinations.  All other systems reviewed and are negative.   Physical Exam: Filed Vitals:   10/13/15 0940  BP: 102/78  Pulse: 88  .wereview ofProbably no intraluminal  review is about to think she is having some trouble initially in as little as you know what the implications give her some money or don't she did not actually pass EM) she'll ever she is I think with retention is probably stroke and anal area I wouldn't expectation to effect; platelet she'll be listed to the [facility] ink. Route small amount I don't think reasonably and residual tumor. A staging is taking only daily $1500 I w at the way she quit In No I Am Seeing a [facility] of 80 AL Video Q Rains Is Probably She Is Going to He Is Allergic Yes Sounds. No SKE Yes  Thank You As Gen:      No acute distress HEENT:  EOMI, sclera anicteric Neck:     No masses; no thyromegaly Lungs:    Clear to auscultation bilaterally; normal respiratory effort CV:         Regular rate and rhythm; no  murmurs Abd:      + bowel sounds; soft, non-tender; no palpable masses, no distension Ext:    No edema; adequate peripheral perfusion Skin:      Warm and dry; no rash Neuro: alert and oriented x 3 Psych: normal mood and affect  Data Reviewed:  Reviewed chart in epic and past GI workup EGD and colonoscopy in 2013  Assessment and Plan/Recommendations:  50 year old male with history of chronic GERD, CAD and pericarditis here for follow-up visit with complaints of loss of appetite, postprandial fullness, nausea and weight loss He lost most of his weight in December and January 2017 and has been stable since Reports no significant improvement with Reglan, advised patient to hold Reglan for now Continue to follow gastroparesis diet with small frequent meals and avoid fiber and high fat diet Continue PPI and antireflux measures We'll obtain abdominal ultrasound to evaluate for possible gallbladder disease and also obtain gastric emptying study to evaluate for possible gastroparesis Return after the studies  K. Denzil Magnuson , MD [phone] Mon-Fri 8a-5p [phone] after 5p, weekends, holidays

## 2015-10-23 ENCOUNTER — Encounter (HOSPITAL_COMMUNITY)
Admission: RE | Admit: 2015-10-23 | Discharge: 2015-10-23 | Disposition: A | Discharge status: Home / Self Care | Payer: Medicare Other | Source: Ambulatory Visit | Attending: Gastroenterology | Admitting: Gastroenterology

## 2015-10-23 ENCOUNTER — Ambulatory Visit (HOSPITAL_COMMUNITY)
Admission: RE | Admit: 2015-10-23 | Discharge: 2015-10-23 | Disposition: A | Discharge status: Home / Self Care | Payer: Medicare Other | Source: Ambulatory Visit | Attending: Gastroenterology | Admitting: Gastroenterology

## 2015-10-23 DIAGNOSIS — R1013 Epigastric pain: Secondary | ICD-10-CM

## 2015-10-23 DIAGNOSIS — R634 Abnormal weight loss: Secondary | ICD-10-CM

## 2015-10-23 DIAGNOSIS — R109 Unspecified abdominal pain: Secondary | ICD-10-CM | POA: Diagnosis not present

## 2015-10-23 MED ORDER — TECHNETIUM TC 99M SULFUR COLLOID
2.0000 | Freq: Once | INTRAVENOUS | Status: AC | PRN
  Administered 2015-10-23: 2 via INTRAVENOUS

## 2015-10-25 ENCOUNTER — Telehealth: Payer: Self-pay | Admitting: Gastroenterology

## 2015-10-25 NOTE — Telephone Encounter (Signed)
Patient is advised of normal results. He says his symptoms of abdominal pain and weight loss. Appointment made to evaluate further.

## 2015-11-14 ENCOUNTER — Other Ambulatory Visit (INDEPENDENT_AMBULATORY_CARE_PROVIDER_SITE_OTHER): Payer: Medicare Other

## 2015-11-14 ENCOUNTER — Ambulatory Visit (INDEPENDENT_AMBULATORY_CARE_PROVIDER_SITE_OTHER): Payer: Medicare Other | Admitting: Gastroenterology

## 2015-11-14 ENCOUNTER — Encounter: Payer: Self-pay | Admitting: Gastroenterology

## 2015-11-14 VITALS — BP 110/70 | HR 68 | Ht 68.0 in | Wt 177.0 lb

## 2015-11-14 DIAGNOSIS — R112 Nausea with vomiting, unspecified: Secondary | ICD-10-CM | POA: Diagnosis not present

## 2015-11-14 DIAGNOSIS — R1011 Right upper quadrant pain: Secondary | ICD-10-CM

## 2015-11-14 DIAGNOSIS — R1013 Epigastric pain: Secondary | ICD-10-CM | POA: Diagnosis not present

## 2015-11-14 LAB — HEMOGLOBIN A1C: Hgb A1c MFr Bld: 5.5 % (ref 4.6–6.5)

## 2015-11-14 NOTE — Patient Instructions (Signed)
Your physician has requested that you go to the basement for lab work before leaving today.   You have been scheduled for a HIDA scan at [facility] (1st floor) on 11/28/15. Please arrive 15 minutes prior to your scheduled appointment at  [id number] am. Make certain not to have anything to eat or drink at least 6 hours prior to your test. Should this appointment date or time not work well for you, please call radiology scheduling at [phone].  _____________________________________________________________________ hepatobiliary (HIDA) scan is an imaging procedure used to diagnose problems in the liver, gallbladder and bile ducts. In the HIDA scan, a radioactive chemical or tracer is injected into a vein in your arm. The tracer is handled by the liver like bile. Bile is a fluid produced and excreted by your liver that helps your digestive system break down fats in the foods you eat. Bile is stored in your gallbladder and the gallbladder releases the bile when you eat a meal. A special nuclear medicine scanner (gamma camera) tracks the flow of the tracer from your liver into your gallbladder and small intestine.  During your HIDA scan  You'll be asked to change into a hospital gown before your HIDA scan begins. Your health care team will position you on a table, usually on your back. The radioactive tracer is then injected into a vein in your arm.The tracer travels through your bloodstream to your liver, where it's taken up by the bile-producing cells. The radioactive tracer travels with the bile from your liver into your gallbladder and through your bile ducts to your small intestine.You may feel some pressure while the radioactive tracer is injected into your vein. As you lie on the table, a special gamma camera is positioned over your abdomen taking pictures of the tracer as it moves through your body. The gamma camera takes pictures continually for about an hour. You'll need to keep still during the HIDA  scan. This can become uncomfortable, but you may find that you can lessen the discomfort by taking deep breaths and thinking about other things. Tell your health care team if you're uncomfortable. The radiologist will watch on a computer the progress of the radioactive tracer through your body. The HIDA scan may be stopped when the radioactive tracer is seen in the gallbladder and enters your small intestine. This typically takes about an hour. In some cases extra imaging will be performed if original images aren't satisfactory, if morphine is given to help visualize the gallbladder or if the medication CCK is given to look at the contraction of the gallbladder. This test typically takes 2 hours to complete. ________________________________________________________________________

## 2015-11-16 ENCOUNTER — Other Ambulatory Visit: Payer: Medicare Other

## 2015-11-16 ENCOUNTER — Ambulatory Visit (INDEPENDENT_AMBULATORY_CARE_PROVIDER_SITE_OTHER): Payer: Medicare Other | Admitting: Internal Medicine

## 2015-11-16 ENCOUNTER — Encounter: Payer: Self-pay | Admitting: Internal Medicine

## 2015-11-16 ENCOUNTER — Ambulatory Visit (INDEPENDENT_AMBULATORY_CARE_PROVIDER_SITE_OTHER)
Admission: RE | Admit: 2015-11-16 | Discharge: 2015-11-16 | Disposition: A | Discharge status: Home / Self Care | Payer: Medicare Other | Source: Ambulatory Visit | Attending: Internal Medicine | Admitting: Internal Medicine

## 2015-11-16 VITALS — BP 108/80 | HR 93 | Ht 69.0 in | Wt 177.8 lb

## 2015-11-16 DIAGNOSIS — R0789 Other chest pain: Secondary | ICD-10-CM

## 2015-11-16 DIAGNOSIS — R0602 Shortness of breath: Secondary | ICD-10-CM | POA: Diagnosis not present

## 2015-11-16 DIAGNOSIS — R5382 Chronic fatigue, unspecified: Secondary | ICD-10-CM

## 2015-11-16 DIAGNOSIS — R0600 Dyspnea, unspecified: Secondary | ICD-10-CM

## 2015-11-16 DIAGNOSIS — R079 Chest pain, unspecified: Secondary | ICD-10-CM | POA: Diagnosis not present

## 2015-11-16 DIAGNOSIS — R05 Cough: Secondary | ICD-10-CM | POA: Diagnosis not present

## 2015-11-16 LAB — NITRIC OXIDE: Nitric Oxide: 11

## 2015-11-16 NOTE — Patient Instructions (Addendum)
ICD-9-CM ICD-10-CM   1. Dyspnea 786.09 R06.00   2. Chronic fatigue 780.79 R53.82   3. Chest pain, atypical 786.59 R07.89     Do feno test 11/16/2015 Do d-dimer blood work and cxr 2 view 11/16/2015 = will call with results

## 2015-11-16 NOTE — Progress Notes (Signed)
Subjective:     Patient ID: Richard Franco, male   DOB: 1966/04/01, 50 y.o.   MRN: [id number]  HPI   HPI 50 year old male with PMH of pericarditis one year ago of unknown reason who persistently had CP since. Presented to the cardiologist with atypical chest pain, somewhat pleuritic in nature.  Cath was performed that was normal. CTA of the chest was done that revealed 3 <1 cm pulmonary nodules and multiple areas of GGO.  Patient has been more SOB over the past 3 months. On further enquires he states that he noticed shortness of breath over the past several years, at rest and with exertion, associated with chest tightnes. Extensive cardiology evaluation did not reveal a cardiac etiology. In average reports daily symptoms,  2-3 times a day, waking up approximately 2 nights per month with dyspnea.  During hospitalization he was initiated on steroids. He endorsed improved symptoms with steroids. Denies cough or wheezing, but does states that sometimes when he is short of breath he hears a noise coming from his throat.   04/02/2012 Steroids tapered off. He did not not report a significant difference off steroids.   04/16/2012 Was seen by Dr. Melvyn Novas in clinic; CT chest showed persistent lung nodules and ground glass opacities in a different territory distribution. Steroids restarted at 20 mg daily; the patient was instructed to taper them to 10 mg.     04/30/12 The patient on 10 mg of prednisone daily;   05/06/12 Bronchoscopy with BAL, cell count and TBBx of the lingula  BAL cell count with elevated macrophages (possible COP, Hypersensitivity pneumonitis vs chronic microaspiration, possible Aluminum exposure)  RVP neg Viral culture neg  AFB smear neg, culture negative   Cytology/pathology benign with no evidence of aspiration or ILD    05/15/12  Remains on 10 mg steroids;  -methacholine challenge neg -barium swallow WNL  07/02/11 present visit -CT chest with resolution of GGO  - persistent  nodules  -steroids decreased to 5 mg   09/16/12  -CT with no GGO, lung nodules stable  -advised to stop steroids  Complains of shortness of breath at rest and with exertion; chest tightness but no wheezing; Denies cough, sputum production, hemoptysis, fever, chills, rash, PND, orthopnea, LE edema, calf tenderness or LE edema. Has had muscle pain after initiating statins; in the interim resolved.   Since the previous visit he presented to the ED and was found to have elevated troponins heart catheterization negative for CAD. He continues to have chest tightness, initiated cardizem and now reports feeling better.   Acute OV 11/02/12 --  Hx of dyspnea, chest discomfort felt to be coronary vasospasm, GGI on CT chest that resolved as of 08/2012 CT scan. Presents today c/o about 3 weeks of tachycardia w exertion, some cough for ~ a week, usually non-productive, chest tightness. Saw some dark specks in his mucous several times w cough 10/22/12, no over blood, none since then. He is having nasal congestion and allergy sx. No sick contacts. He stopped prednisone in mid March. Of note he has had progressive joint pain in his fingers and hands since stopping the prednisone.   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 No other autoimmune manifestations and no changes in environment. No drug use.   Occupation: toxic exposure to aluminum, steel, plastic dust Travels:no travels abroad TB exposure:denies Pets: none  REC Please start loratadine 10mg  daily  Please start fluticasone nasal spray,  2 sprays each side twice a day  CXR today  Follow with Dr Chase Caller in 4 weeks to assess your symptoms. You may need to discuss repeating your CT scan of the chest at that time.    OV 01/14/2013   He has multiple issues which include arthritis, fibromyalgia, depression, GERD, anxiety, migraines, and tobacco abuse (quit smoking 9 months ago). He has  had a recent NSTEMI with cath showing normal coronary arteries, EF of 55 to 65%. This was felt to be due to vasospasm. D dimer was positive but CT was negative for PE. He has had a chronically abnormal chest CT. March 2014: shown stable scattered bilateral pulmonary nodules most c/w underlying infectious/inflammatory or granulomatous proces 66mm - 41mm; plan to repeat in 6-12 months which would be Oct 2014/March 2015   Now following with pumonray. Transition of care from Dr Conception Chancy to Dr Chase Caller. Since prior pulmonary visit in the interim Has see rheum Dr Amil Amen recently an dplaced on 10 day prednisone. Is having autoimmune panel repeated by rheumatology This is for diffuse joint pain and some myalgia.  In terms of pulmonary symptoms: Still has chronic early morning chest tightness and not relieved by albuterol. Refrains from NTG use .,.Adiditonally still with moderate dyspnea on exertion, esp for hills at golf. Relieved by rest. Unknown if releived by NTG; refrains. Associated chest ttightness + No wheeze or cough or syncope. STable. Insidious onset of months to years. No PND or orthopnea  [employer]  reports that he quit smoking about 9 months ago. His smoking use included Cigarettes. He has a 27 pack-year smoking history. He has quit using smokeless tobacco. His smokeless tobacco use included Chew.   Labs  normal spirometry oct and  nov 2013 Oct 2013: autoimmne ANA, ACE, ANCA, RF, - negative March 2014 CT chest: clearing GGI but small nodules 8mm x 2  REC Pleae have cardiopulmonary stress tst  Return to see me after that    OV 03/01/2013 Follow up dyspnea and fatigue. He had cardiopulmonary stress test on 02/23/2013 and the results of the lobe and suggest physical deconditioning as a cause for dyspnea and fatigue. He surprised that the result because he says that he does activities of daily living and is doing yard work and also walk in [employer] and other work in [employer]. However, he states  that symptoms have been persisting since 2006/2008 and he had a viral illness following exposure to his son with  respiratory viru and who in turn was exposed to a kid with measles. He states that all his dyspnea and fatigue his since then even though he recovered from the acute illness. He recollects being infectious diseases at [place] and recollects changing diagnoses in 2008. He starts are not available to me but I do note that so the results are mentioned in July 2008. He was finally told that he had parvovirus infection.    also he states that he is known to have low testosterone and prior thyroid issues but does not have endocrinologist on record  CPST 02/23/2013  Procedure: This patient underwent staged symptom-limited exercise testing using a individualized bike protocol with expired gas analysis metabolic evaluation during exercise.  Demographics  Age: 18 Ht. (in.) 53 Wt. (lb) 178 BMI: 27.1 Predicted Peak VO2: 33.6 ml/kg/min  Gender: Male Ht (cm) 172.7 Wt. (kg) 80.7    Results  Pre-Exercise PFTs   FVC 4.87 (101%)   FEV1 3.93 (103%)   FEV1/FVC 81   MVV  162 (107%)  Post-Exercise PFTs (from lowest post-exercise trial (%change from rest))  FVC 4.87 (0%)   FEV1 3.95 (0%)   FEV1/FVC 80%    Exercise Time: 9:24 Watts: 180  RPE: 20  Reason stopped: Patient stopped due to leg fatigue and dyspnea (7/10)  Additional symptoms: Chest discomfort (upper left chest) 4/10  Resting HR: 72 Peak HR: 151 (87% age predicted max HR)  BP rest: 113/78 BP peak: 190/64  Peak VO2: 25.6 (76.2% predicted peak VO2)  VE/VCO2 slope: 31.2  OUES: 1.98  Peak RER: 1.28  Ventilatory Threshold: 11.3 (33.6% predicted peak VO2)  Peak RR 30  Peak Ventilation: 82.8  VE/MVV: 51.2%  PETCO2 at peak: 37  O2pulse: 14 (88% predicted O2pulse)   Interpretation  Notes: Patient gave a very good effort. The pulse-oximetry remained at 96% or greater throughout the exercise.  Exercise was performed on a cycle ergometer starting at [facility] and increasing by 20 W/min.  ECG: Resting ECG in normal sinus rhythm. There was an adequate HR response to the exercise with no ST-T changes or arrhythmias, however patient did not reach age predicted maximal HR. The BP response was appropriate.  PFT: Resting spirometry within normal limits. The MVV was normal. Post-exercise spirometry was performed IPE, 5, 10, 15 and 20 (reported above) minutes of recovery with no significant changes from rest to indicate respiratory muscle fatigue or EIB.  CPX: The RER of 1.28 indicates a maximal effort. The peak VO2 is mildly reduced at 25.6 ml/kg/min (76% of the age/gender/weight matched sedentary norm). The VO2 a the ventilatory threshold was well below normal at 33.6% of the predicted peak VO2. At peak exercise the ventilation was 51% of the measured MVV indicating ventilatory reserve remained (respiratory rate was below the expected range, Vt/IC [90%] was at its limit, PETCO2 was high normal). There was an overall inappropriate HR response to the exercise with a HR reserve of 23 bpm. The O2pulse (a surrogate for stroke volume) increased throughout the exercise reaching 14 ml/beat (88% of predicted). The VE/VCO2 slope was mildly elevated suggesting some reduced ventilatory efficiency. The oxygen uptake efficiency slope (OUES) was mildly reduced and reflects the patient's measured functional capacity.  Conclusion: Exercise testing with gas exchange demonstrates a mild functional limitation when compared to matched sedentary norms. The patient's tidal volume during exercise appears to be a limiting factor, couple with the low respiratory rate. The elevated tidal volume during exercise can induce a sensation of not being able to get a full breath and may cause chest discomfort. There also appears to be significant deconditioning. ^^^Preliminary CPX Results, Finalized results will be  forwarded when completed by interpreting physician.^^^  Prepared by: Linzie Collin, MEd, ACSM-RCEP Sr. Exercise Physiologist 02/23/2013 2:52 PM   REC  Your breathing test suggests physical deconditioning whic could be due to the unexplained viral illness you had several years ago [facility] REfer Dr Chalmers Cater for endocrine causes of fatgue  REturn early Dec 2014 wih CT chest   OV 06/14/2013  FU   Dyspnea/fatigue and lung nodule  Dyspnea/FAigute  - stome improved. Beginning to workout. DR balan has him on LOw T replacement. STill he is not satisfied with level of improvement. Also describing some early moprning chest tightness in front of sternum when he gets up x 1 month. No reprodyucible tenderness. No fever. No chills. Reivew of records indicate that [place] have started him on prednisone in OCt 2013 but stopped March 2014. This was for  GGO in lung. However, in July 2014 he indicated rheum Dr Amil Amen started him on predinose. Review of med list shows that he is not in it right now  CT chest 06/04/13  IMPRESSION:  1. Stable small, nonspecific pulmonary nodules. Its is these nodules  have remained unchanged since 03/24/2012. The largest nodule  measures 5 mm. If this patient is at low risk for lung cancer then  no further followup is necessary. In a patient with increased risk  for lung cancer documented stability for 18-24 months is advised.  This recommendation follows the consensus statement: Guidelines for  Management of Small Pulmonary Nodules Detected on CT Scans: A  Statement from the [employer] as published in Radiology  2005; 237:395-400.  Electronically Signed  By: Kerby Moors M.D.  On: 06/04/2013 13:35          Result Notes    Notes Recorded by Brand Males, MD on 06/09/2013 at 1:02 AM Will disuccc CT results ov 06/14/13. Nodules stable x 1 year. Next Ct in 74months or so     OV 07/19/2014  Chief Complaint   Patient presents with  . Follow-up    Pt stated his breathing is unchanged since last OV. Pt c/o DOE and CP when active. Pt denies cough.    One-year follow-up for pulmonary nodule in association with dyspnea, fatigue and prior smoking history  This past one year he says that he does not have dyspnea anymore or any respiratory symptoms for that matter. However, he continues to have chronic fatigue associated with arthralgia. He has not followed up with his rheumatologist Dr. Amil Amen was previously deemed that he has not found to have any autoimmune disease. He has not followed up with Dr. Chalmers Cater his endocrinologist. He continues on testosterone patch but this does not seem to help him. Overall he is frustrated by situation. Smoking continues to be in remission  I did elicit from him that as a young man he used to work out excessively in the gym lifting weights and doing aerobics. However in the last several years she's not doing this anymore and he is physically deconditioned. It is possible that this is an etiology for his fatigue    OV 11/16/2015  Chief Complaint  Patient presents with  . Acute Visit    Pt c/o some CP with inspiration x 1 week. Pt notes some SOB with exertion but nothing more increased than normal. Pt notes some pressure center chest when laying flat. Denies cough and wheeze.     Not seen him in over one year. Is always had a complex symptomatology for which we never had good reasoning. He now complains about atypical chest pain that is present anteriorly that is constant. Occasionally has relief. It is been going on for 10 days. It is mild to moderate in intensity. It is new. It is different from previous symptoms. It can get worse occasionally when he takes a deep breath. There no clear cut relieving factors. He has chronic shortness of breath and fatigue that has really not resolve. Review of the past medical record shows that he visited GI in April 2017. They noticed weight  loss. If he gets significant acid reflux. Off note he has a strong history of cervical disc disease. He had extensive cervical surgery with fusion of multiple disks over 15 years ago. He has limited range of motion of his neck. He denies any thoracic disc pain. There is no associated wheezing  Exhaled nitric oxide today  in the office i 11 ppb and normal  Is not too keen about having extensive workup for these symptoms.    has a past medical history of Fibromyalgia; Depression; GERD (gastroesophageal reflux disease); Testosterone deficiency; Anxiety; Abnormal chest CT (03/2012); Tobacco abuse; Complication of anesthesia; NSTEMI (non-ST elevated myocardial infarction) ([place] (07/2012); Migraine headache; Arthritis; Chronic midline posterior neck pain; and Esophageal stricture.   reports that he has been smoking Cigarettes.  He has a 20.625 pack-year smoking history. He has quit using smokeless tobacco. His smokeless tobacco use included Chew.  Past Surgical History  Procedure Laterality Date  . Posterior cervical fusion/foraminotomy  05/09/2011    Procedure: POSTERIOR CERVICAL FUSION/FORAMINOTOMY LEVEL 1;  Surgeon: Hosie Spangle;  Location: [place] [facility];  Service: Neurosurgery;  Laterality: N/A;  C4/5 posterior arthrodesis with instrumentation   . Cystectomy    . Ankle fusion Left 2006  . Video bronchoscopy  05/06/2012    Procedure: VIDEO BRONCHOSCOPY WITH FLUORO;  Surgeon: Jyl Heinz, MD;  Location: [facility] ENDOSCOPY;  Service: Cardiopulmonary;  Laterality: Bilateral;  . Left heart catheterization with coronary angiogram N/A 03/25/2012    Procedure: LEFT HEART CATHETERIZATION WITH CORONARY ANGIOGRAM;  Surgeon: Burnell Blanks, MD;  Location: [facility] CATH LAB;  Service: Cardiovascular;  Laterality: N/A;  . Left heart catheterization with coronary angiogram N/A 08/11/2012    Procedure: LEFT HEART CATHETERIZATION WITH CORONARY ANGIOGRAM;  Surgeon: Peter M Martinique, MD;  Location: [facility] CATH LAB;   Service: Cardiovascular;  Laterality: N/A;  . Cervical fusion  X7 "last one 04/2011"  . Back surgery    . Fracture surgery      Allergies  Allergen Reactions  . Midodrine Hcl Swelling    Tongue swelling  . Sulfonamide Derivatives Rash         Immunization History  Administered Date(s) Administered  . Influenza Split 04/16/2012  . Influenza,inj,Quad PF,36+ Mos 03/01/2013, 05/08/2015  . Pneumococcal Polysaccharide-23 04/16/2012    Family History  Problem Relation Age of Onset  . Diabetes Mother   . Coronary artery disease Paternal Uncle   . Asthma Sister      Current outpatient prescriptions:  .  amLODipine (NORVASC) 5 MG tablet, Take 1 tablet (5 mg total) by mouth daily., Disp: 30 tablet, Rfl: 11 .  aspirin EC 81 MG EC tablet, Take 1 tablet (81 mg total) by mouth daily., Disp: , Rfl:  .  busPIRone (BUSPAR) 10 MG tablet, Take 10 mg by mouth 2 (two) times daily., Disp: , Rfl:  .  esomeprazole (NEXIUM) 40 MG capsule, Take 1 capsule (40 mg total) by mouth 2 (two) times daily before a meal., Disp: 60 capsule, Rfl: 2 .  FLUoxetine (PROZAC) 20 MG capsule, Take 40 mg by mouth daily. , Disp: , Rfl:  .  lamoTRIgine (LAMICTAL) 100 MG tablet, Take 100 mg by mouth 2 (two) times daily., Disp: , Rfl:  .  nitroGLYCERIN (NITROSTAT) 0.4 MG SL tablet, DISSOLVE ONE TABLET UNDER THE TONGUE EVERY 5 MINUTES AS NEEDED FOR CHEST PAIN.  DO NOT EXCEED A TOTAL OF 3 DOSES IN 15 MINUTES, Disp: 25 tablet, Rfl: 6    Review of Systems Per history of present illness    Objective:   Physical Exam  Constitutional: He is oriented to person, place, and time. He appears well-developed and well-nourished. No distress.  HENT:  Head: Normocephalic and atraumatic.  Right Ear: External ear normal.  Left Ear: External ear normal.  Mouth/Throat: Oropharynx is clear and moist. No oropharyngeal exudate.  Scar i neck Limited range of motion in neck  Eyes: Conjunctivae and EOM are normal. Pupils are equal, round,  and reactive to light. Right eye exhibits no discharge. Left eye exhibits no discharge. No scleral icterus.  Neck: Normal range of motion. Neck supple. No JVD present. No tracheal deviation present. No thyromegaly present.  Cardiovascular: Normal rate, regular rhythm and intact distal pulses.  Exam reveals no gallop and no friction rub.   No murmur heard. Pulmonary/Chest: Effort normal and breath sounds normal. No respiratory distress. He has no wheezes. He has no rales. He exhibits no tenderness.  Abdominal: Soft. Bowel sounds are normal. He exhibits no distension and no mass. There is no tenderness. There is no rebound and no guarding.  Musculoskeletal: Normal range of motion. He exhibits no edema or tenderness.  Lymphadenopathy:    He has no cervical adenopathy.  Neurological: He is alert and oriented to person, place, and time. He has normal reflexes. No cranial nerve deficit. Coordination normal.  Skin: Skin is warm and dry. No rash noted. He is not diaphoretic. No erythema. No pallor.  Tattoos on his chest  Psychiatric: He has a normal mood and affect. His behavior is normal. Judgment and thought content normal.  Nursing note and vitals reviewed.   Filed Vitals:   11/16/15 1226  BP: 108/80  Pulse: 93  Height: 5\' 9"  (1.753 m)  Weight: 177 lb 12.8 oz (80.65 kg)  SpO2: 99%        Assessment:       ICD-9-CM ICD-10-CM   1. Dyspnea 786.09 R06.00 D-Dimer, Quantitative     DG Chest 2 View  2. Chronic fatigue 780.79 R53.82 D-Dimer, Quantitative     DG Chest 2 View  3. Chest pain, atypical 786.59 R07.89 D-Dimer, Quantitative     DG Chest 2 View   No asthma. New pain    Plan:     Unclear what is gong on . No asthma. Top DDx is thoracic spine disc issues or gerd. Bt given chronic fatigue and dyspnea - chek d-dimer and cxr. If these are normal consider T spine MRI or CT chest or expectant fu. He prefers latter. OV fu dependoing on test results  Dr. Brand Males, M.D.,  [facility].C.P Pulmonary and Critical Care Medicine Staff Physician [facility] Pager: [phone], If no answer or between  15:00h - 7:00h: call [phone]  11/16/2015 12:51 PM

## 2015-11-17 ENCOUNTER — Telehealth: Payer: Self-pay | Admitting: Internal Medicine

## 2015-11-17 DIAGNOSIS — R7989 Other specified abnormal findings of blood chemistry: Secondary | ICD-10-CM

## 2015-11-17 LAB — D-DIMER, QUANTITATIVE (NOT AT ARMC): D-Dimer, Quant: 0.5 ug/mL-FEU — ABNORMAL HIGH (ref 0.00–0.48)

## 2015-11-17 NOTE — Telephone Encounter (Signed)
   D-dimer right at cusp of normal v abnormal CXr - clear  plan - do VQ scan next week - in between if he gets worse go to ER  frther instruction based on VQ   Dg Chest 2 View  11/16/2015  CLINICAL DATA:  Chronic cough, fatigue, chest pain. EXAM: CHEST  2 VIEW COMPARISON:  06/21/2015 FINDINGS: Cardiomediastinal silhouette is normal. Mediastinal contours appear intact. There is no evidence of focal airspace consolidation, pleural effusion or pneumothorax. Osseous structures are without acute abnormality. Soft tissues are grossly normal. IMPRESSION: No active cardiopulmonary disease. Electronically Signed   By: Fidela Salisbury M.D.   On: 11/16/2015 16:15

## 2015-11-17 NOTE — Telephone Encounter (Signed)
Pt is aware of results. Orders for VQ scan CXR have been placed. Nothing further was needed.

## 2015-11-22 NOTE — Progress Notes (Signed)
Richard Franco    [id number]    09/23/1965  Primary Care Physician:MOORE, Elenore Rota, MD  Referring Physician: Chipper Herb, MD [address] [place], [place] [address]  Chief complaint:  Lack of appetite, nausea, fullness postprandial  HPI:  50 year old male with history of chronic GERD here for follow-up visit with complaints of persistent intermittent nausea, lack of appetite and postprandial fullness. He had normal gastric emptying. Abdominal ultrasound did not show any acute findings, no evidence of gallstones or gallbladder thickening. He has had multiple EGDs most recent in 2013 that showed prominent folds in fundus and was dilated with [facility] dilator to 18 mm. Denies any change in bowel habits, vomiting or abdominal pain.   Outpatient Encounter Prescriptions as of 11/14/2015  Medication Sig  . amLODipine (NORVASC) 5 MG tablet Take 1 tablet (5 mg total) by mouth daily.  Marland Kitchen aspirin EC 81 MG EC tablet Take 1 tablet (81 mg total) by mouth daily.  . busPIRone (BUSPAR) 10 MG tablet Take 10 mg by mouth 2 (two) times daily.  Marland Kitchen esomeprazole (NEXIUM) 40 MG capsule Take 1 capsule (40 mg total) by mouth 2 (two) times daily before a meal.  . FLUoxetine (PROZAC) 20 MG capsule Take 40 mg by mouth daily.   Marland Kitchen lamoTRIgine (LAMICTAL) 100 MG tablet Take 100 mg by mouth 2 (two) times daily.  . nitroGLYCERIN (NITROSTAT) 0.4 MG SL tablet DISSOLVE ONE TABLET UNDER THE TONGUE EVERY 5 MINUTES AS NEEDED FOR CHEST PAIN.  DO NOT EXCEED A TOTAL OF 3 DOSES IN 15 MINUTES  . [DISCONTINUED] metoCLOPramide (REGLAN) 5 MG tablet Take 1 tablet (5 mg total) by mouth 3 (three) times daily before meals.   No facility-administered encounter medications on file as of 11/14/2015.    Allergies as of 11/14/2015 - Review Complete 11/14/2015  Allergen Reaction Noted  . Midodrine hcl Swelling   . Sulfonamide derivatives Rash     Past Medical History  Diagnosis Date  . Fibromyalgia   . Depression   . GERD  (gastroesophageal reflux disease)   . Testosterone deficiency   . Anxiety   . Abnormal chest CT 03/2012    a. 2013: Scattered patchy ground glass opacities and pulmonary nodules. Transbronchial biopsy with no definite etiology of lung finding- possible organizing pneumonia. b. F/u CT 05/2014: stable multiple tiny pulm nodules, no further w/u recommended per notes.   . Tobacco abuse   . Complication of anesthesia     " difficult to urinate after "  . NSTEMI (non-ST elevated myocardial infarction) ([place]) 07/2012    a. Normal cath 03/2012. b. 07/2012: troponin 4, normal cors, ?vasospasm. Did not tolerate Imdur due to headache. On amlodipine.  . Migraine headache     "none lately; I've had alot in my life" (06/21/2015)  . Arthritis   . Chronic midline posterior neck pain   . Esophageal stricture     Past Surgical History  Procedure Laterality Date  . Posterior cervical fusion/foraminotomy  05/09/2011    Procedure: POSTERIOR CERVICAL FUSION/FORAMINOTOMY LEVEL 1;  Surgeon: Hosie Spangle;  Location: [place] [facility]URO ORS;  Service: Neurosurgery;  Laterality: N/A;  C4/5 posterior arthrodesis with instrumentation   . Cystectomy    . Ankle fusion Left 2006  . Video bronchoscopy  05/06/2012    Procedure: VIDEO BRONCHOSCOPY WITH FLUORO;  Surgeon: Jyl Heinz, MD;  Location: [facility] ENDOSCOPY;  Service: Cardiopulmonary;  Laterality: Bilateral;  . Left heart catheterization with coronary  angiogram N/A 03/25/2012    Procedure: LEFT HEART CATHETERIZATION WITH CORONARY ANGIOGRAM;  Surgeon: Burnell Blanks, MD;  Location: [facility] CATH LAB;  Service: Cardiovascular;  Laterality: N/A;  . Left heart catheterization with coronary angiogram N/A 08/11/2012    Procedure: LEFT HEART CATHETERIZATION WITH CORONARY ANGIOGRAM;  Surgeon: Peter M Martinique, MD;  Location: [facility] CATH LAB;  Service: Cardiovascular;  Laterality: N/A;  . Cervical fusion  X7 "last one 04/2011"  . Back surgery    . Fracture surgery      Family History    Problem Relation Age of Onset  . Diabetes Mother   . Coronary artery disease Paternal Uncle   . Asthma Sister     Social History   Social History  . Marital Status: Married    Spouse Name: N/A  . Number of Children: N/A  . Years of Education: N/A   Occupational History  . Not on file.   Social History Main Topics  . Smoking status: Current Every Day Smoker -- 0.75 packs/day for 27.5 years    Types: Cigarettes  . Smokeless tobacco: Former Systems developer    Types: Chew  . Alcohol Use: 12.6 oz/week    21 Cans of beer, 0 Standard drinks or equivalent per week     Comment: 06/21/2015 "3 beers/day lately; more lately cause of being separated"   . Drug Use: No  . Sexual Activity: Yes   Other Topics Concern  . Not on file   Social History Narrative      Review of systems: Review of Systems  Constitutional: Negative for fever and chills.  HENT: Negative.   Eyes: Negative for blurred vision.  Respiratory: Negative for cough, shortness of breath and wheezing.   Cardiovascular: Negative for chest pain and palpitations.  Gastrointestinal: as per HPI Genitourinary: Negative for dysuria, urgency, frequency and hematuria.  Musculoskeletal: Negative for myalgias, back pain and joint pain.  Skin: Negative for itching and rash.  Neurological: Negative for dizziness, tremors, focal weakness, seizures and loss of consciousness.  Endo/Heme/Allergies: Negative for environmental allergies.  Psychiatric/Behavioral: Negative for depression, suicidal ideas and hallucinations.  All other systems reviewed and are negative.   Physical Exam: Filed Vitals:   11/14/15 1447  BP: 110/70  Pulse: 68   Gen:      No acute distress HEENT:  EOMI, sclera anicteric Neck:     No masses; no thyromegaly Lungs:    Clear to auscultation bilaterally; normal respiratory effort CV:         Regular rate and rhythm; no murmurs Abd:      + bowel sounds; soft, non-tender; no palpable masses, no distension Ext:     No edema; adequate peripheral perfusion Skin:      Warm and dry; no rash Neuro: alert and oriented x 3 Psych: normal mood and affect  Data Reviewed:Reviewed chart in epic   Assessment and Plan/Recommendations:  50 year old male with history of chronic GERD here with complaints of lack of appetite, intermittent nausea and postprandial fullness. He did not have any evidence of gallbladder disease on abdominal ultrasound but will obtain HIDA scan to evaluate gallbladder function He has normal gastric emptying If he continues to have persistent symptoms and HIDA scan is normal to have to consider repeat EGD to further evaluate his symptoms   K. Denzil Magnuson , MD [phone] Mon-Fri 8a-5p [phone] after 5p, weekends, holidays  CC: Chipper Herb, MD

## 2015-11-23 ENCOUNTER — Encounter (HOSPITAL_COMMUNITY)
Admission: RE | Admit: 2015-11-23 | Discharge: 2015-11-23 | Disposition: A | Discharge status: Home / Self Care | Payer: Medicare Other | Source: Ambulatory Visit | Attending: Internal Medicine | Admitting: Internal Medicine

## 2015-11-23 ENCOUNTER — Ambulatory Visit (HOSPITAL_COMMUNITY)
Admission: RE | Admit: 2015-11-23 | Discharge: 2015-11-23 | Disposition: A | Discharge status: Home / Self Care | Payer: Medicare Other | Source: Ambulatory Visit | Attending: Internal Medicine | Admitting: Internal Medicine

## 2015-11-23 DIAGNOSIS — R7989 Other specified abnormal findings of blood chemistry: Secondary | ICD-10-CM

## 2015-11-23 DIAGNOSIS — R791 Abnormal coagulation profile: Secondary | ICD-10-CM | POA: Insufficient documentation

## 2015-11-23 DIAGNOSIS — R0602 Shortness of breath: Secondary | ICD-10-CM | POA: Diagnosis not present

## 2015-11-23 MED ORDER — TECHNETIUM TO 99M ALBUMIN AGGREGATED
4.1000 | Freq: Once | INTRAVENOUS | Status: AC | PRN
  Administered 2015-11-23: 4 via INTRAVENOUS

## 2015-11-24 ENCOUNTER — Telehealth: Payer: Self-pay | Admitting: Internal Medicine

## 2015-11-24 NOTE — Telephone Encounter (Signed)
VQ normal. S o cannot explain his symptoms. If he wants further workup we can look at C and T spine MRI or CPST - you can tell hm that but I think he just wanted to watch and follow prn    Dg Chest 2 View  11/23/2015  CLINICAL DATA:  Abnormal D-dimer, history of myocardial infarction, smoking history EXAM: CHEST  2 VIEW COMPARISON:  Chest x-ray of 11/16/2015 FINDINGS: No active infiltrate or effusion is seen. Mediastinal and hilar contours are unremarkable. The heart is within normal limits in size. No bony abnormality is seen. A lower anterior cervical spine fusion plate is present. IMPRESSION: No active cardiopulmonary disease. Electronically Signed   By: Ivar Drape M.D.   On: 11/23/2015 16:04   Nm Pulmonary Per & Vent  11/23/2015  CLINICAL DATA:  Abnormal D-dimer.  Chronic shortness of breath. EXAM: NUCLEAR MEDICINE VENTILATION - PERFUSION LUNG SCAN TECHNIQUE: Ventilation images were obtained in multiple projections using inhaled aerosol Tc-57m DTPA. Perfusion images were obtained in multiple projections after intravenous injection of Tc-22m MAA. RADIOPHARMACEUTICALS:  30.0 mCi Technetium-22m DTPA aerosol inhalation and 4.0 mCi Technetium-38m MAA IV COMPARISON:  Chest radiograph from today FINDINGS: Ventilation: No focal ventilation defect. Perfusion: No wedge shaped peripheral perfusion defects to suggest acute pulmonary embolism. IMPRESSION: Very low probability for acute pulmonary embolus. Electronically Signed   By: Kerby Moors M.D.   On: 11/23/2015 16:25

## 2015-11-27 ENCOUNTER — Other Ambulatory Visit: Payer: Self-pay

## 2015-11-27 ENCOUNTER — Telehealth: Payer: Self-pay | Admitting: Gastroenterology

## 2015-11-27 DIAGNOSIS — R112 Nausea with vomiting, unspecified: Secondary | ICD-10-CM

## 2015-11-27 DIAGNOSIS — R109 Unspecified abdominal pain: Secondary | ICD-10-CM

## 2015-11-27 NOTE — Telephone Encounter (Signed)
Spoke with pt about results/recs- pt states he would like to proceed with further workup, but worries about a CPST because of his hx of heart attack.  Pt wants to know what exactly MR is recommending for further workup, and if he thinks he needs another CT chest to follow up on ground glass findings in lungs previously.    MR please advise.  Thanks!

## 2015-11-27 NOTE — Telephone Encounter (Signed)
Rescheduled HIDA for 12/07/15 at 7:30 am. Patient agrees to this.

## 2015-11-28 ENCOUNTER — Ambulatory Visit (HOSPITAL_COMMUNITY): Payer: Medicare Other

## 2015-11-29 ENCOUNTER — Other Ambulatory Visit: Payer: Self-pay | Admitting: Gastroenterology

## 2015-12-07 ENCOUNTER — Ambulatory Visit (HOSPITAL_COMMUNITY)
Admission: RE | Admit: 2015-12-07 | Discharge: 2015-12-07 | Disposition: A | Discharge status: Home / Self Care | Payer: Medicare Other | Source: Ambulatory Visit | Attending: Gastroenterology | Admitting: Gastroenterology

## 2015-12-07 DIAGNOSIS — R109 Unspecified abdominal pain: Secondary | ICD-10-CM | POA: Insufficient documentation

## 2015-12-07 DIAGNOSIS — R112 Nausea with vomiting, unspecified: Secondary | ICD-10-CM | POA: Diagnosis not present

## 2015-12-07 DIAGNOSIS — K838 Other specified diseases of biliary tract: Secondary | ICD-10-CM | POA: Diagnosis not present

## 2015-12-07 MED ORDER — TECHNETIUM TC 99M MEBROFENIN IV KIT: 5.2000 | PACK | Freq: Once | INTRAVENOUS | Status: DC | PRN

## 2015-12-13 NOTE — Telephone Encounter (Signed)
atc pt, line was bad and pt could not hear me.  Called pt back, line was still bad, call was dropped.  Wcb.

## 2015-12-13 NOTE — Telephone Encounter (Signed)
His xray is clear and in addition last CT chest dec 2015 showed long terms stability of small nodules. So, I do not see value in repeating CT ches but if he insists can do HRCT wo contrast. Let me know

## 2015-12-31 ENCOUNTER — Other Ambulatory Visit: Payer: Self-pay | Admitting: Gastroenterology

## 2016-01-17 ENCOUNTER — Telehealth: Payer: Self-pay | Admitting: Gastroenterology

## 2016-01-19 NOTE — Telephone Encounter (Signed)
Ok, please schedule EGD given his persistent symptoms, work up so far unrevealing for any significant pathology

## 2016-01-19 NOTE — Telephone Encounter (Signed)
Patient agrees to this plan. Pre-visit 01/29/16 and EGD 02/05/16.

## 2016-01-19 NOTE — Telephone Encounter (Signed)
Complaint of lack of appetite, intermittent nausea and postprandial fullness. The HIDA scan was normal. He does not feel better. Should I schedule an EGD?

## 2016-01-23 ENCOUNTER — Encounter: Payer: Self-pay | Admitting: Gastroenterology

## 2016-02-05 ENCOUNTER — Emergency Department (HOSPITAL_COMMUNITY)
Admission: EM | Admit: 2016-02-05 | Discharge: 2016-02-06 | Disposition: A | Discharge status: Home / Self Care | Payer: Medicare Other | Attending: Emergency Medicine | Admitting: Emergency Medicine

## 2016-02-05 ENCOUNTER — Emergency Department (HOSPITAL_COMMUNITY): Payer: Medicare Other

## 2016-02-05 ENCOUNTER — Encounter (HOSPITAL_COMMUNITY): Payer: Self-pay | Admitting: *Deleted

## 2016-02-05 ENCOUNTER — Encounter: Payer: Medicare Other | Admitting: Gastroenterology

## 2016-02-05 DIAGNOSIS — S22058A Other fracture of T5-T6 vertebra, initial encounter for closed fracture: Secondary | ICD-10-CM | POA: Insufficient documentation

## 2016-02-05 DIAGNOSIS — S12100A Unspecified displaced fracture of second cervical vertebra, initial encounter for closed fracture: Secondary | ICD-10-CM

## 2016-02-05 DIAGNOSIS — S22068A Other fracture of T7-T8 thoracic vertebra, initial encounter for closed fracture: Secondary | ICD-10-CM | POA: Diagnosis not present

## 2016-02-05 DIAGNOSIS — T148 Other injury of unspecified body region: Secondary | ICD-10-CM | POA: Diagnosis not present

## 2016-02-05 DIAGNOSIS — S22048A Other fracture of fourth thoracic vertebra, initial encounter for closed fracture: Secondary | ICD-10-CM | POA: Diagnosis not present

## 2016-02-05 DIAGNOSIS — S22078A Other fracture of T9-T10 vertebra, initial encounter for closed fracture: Secondary | ICD-10-CM | POA: Insufficient documentation

## 2016-02-05 DIAGNOSIS — M546 Pain in thoracic spine: Secondary | ICD-10-CM | POA: Diagnosis not present

## 2016-02-05 DIAGNOSIS — Y939 Activity, unspecified: Secondary | ICD-10-CM | POA: Diagnosis not present

## 2016-02-05 DIAGNOSIS — Y990 Civilian activity done for income or pay: Secondary | ICD-10-CM | POA: Diagnosis not present

## 2016-02-05 DIAGNOSIS — S299XXA Unspecified injury of thorax, initial encounter: Secondary | ICD-10-CM | POA: Diagnosis present

## 2016-02-05 DIAGNOSIS — S3993XA Unspecified injury of pelvis, initial encounter: Secondary | ICD-10-CM | POA: Diagnosis not present

## 2016-02-05 DIAGNOSIS — S12190A Other displaced fracture of second cervical vertebra, initial encounter for closed fracture: Secondary | ICD-10-CM | POA: Diagnosis not present

## 2016-02-05 DIAGNOSIS — R52 Pain, unspecified: Secondary | ICD-10-CM

## 2016-02-05 DIAGNOSIS — I252 Old myocardial infarction: Secondary | ICD-10-CM | POA: Insufficient documentation

## 2016-02-05 DIAGNOSIS — R0781 Pleurodynia: Secondary | ICD-10-CM | POA: Diagnosis not present

## 2016-02-05 DIAGNOSIS — F17210 Nicotine dependence, cigarettes, uncomplicated: Secondary | ICD-10-CM | POA: Insufficient documentation

## 2016-02-05 DIAGNOSIS — Y929 Unspecified place or not applicable: Secondary | ICD-10-CM | POA: Diagnosis not present

## 2016-02-05 DIAGNOSIS — Z7982 Long term (current) use of aspirin: Secondary | ICD-10-CM | POA: Insufficient documentation

## 2016-02-05 DIAGNOSIS — R918 Other nonspecific abnormal finding of lung field: Secondary | ICD-10-CM | POA: Diagnosis not present

## 2016-02-05 DIAGNOSIS — W1789XA Other fall from one level to another, initial encounter: Secondary | ICD-10-CM | POA: Diagnosis not present

## 2016-02-05 DIAGNOSIS — S199XXA Unspecified injury of neck, initial encounter: Secondary | ICD-10-CM | POA: Diagnosis not present

## 2016-02-05 DIAGNOSIS — M545 Low back pain: Secondary | ICD-10-CM | POA: Diagnosis not present

## 2016-02-05 LAB — I-STAT CREATININE, ED: Creatinine, Ser: 1.3 mg/dL — ABNORMAL HIGH (ref 0.61–1.24)

## 2016-02-05 MED ORDER — LORAZEPAM 1 MG PO TABS
0.5000 mg | ORAL_TABLET | Freq: Once | ORAL | Status: AC
  Administered 2016-02-05: 0.5 mg via ORAL
  Filled 2016-02-05: qty 1

## 2016-02-05 MED ORDER — IOPAMIDOL (ISOVUE-300) INJECTION 61%
INTRAVENOUS | Status: AC
  Administered 2016-02-05: 75 mL
  Filled 2016-02-05: qty 75

## 2016-02-05 MED ORDER — NAPROXEN 250 MG PO TABS
500.0000 mg | ORAL_TABLET | Freq: Once | ORAL | Status: AC
  Administered 2016-02-05: 500 mg via ORAL
  Filled 2016-02-05: qty 2

## 2016-02-05 MED ORDER — HYDROMORPHONE HCL 1 MG/ML IJ SOLN
1.0000 mg | Freq: Once | INTRAMUSCULAR | Status: AC
  Administered 2016-02-05: 1 mg via INTRAVENOUS
  Filled 2016-02-05: qty 1

## 2016-02-05 MED ORDER — OXYCODONE-ACETAMINOPHEN 5-325 MG PO TABS
2.0000 | ORAL_TABLET | Freq: Once | ORAL | Status: AC
  Administered 2016-02-05: 2 via ORAL
  Filled 2016-02-05: qty 2

## 2016-02-05 MED ORDER — OXYCODONE-ACETAMINOPHEN 10-325 MG PO TABS: 1.0000 | ORAL_TABLET | Freq: Four times a day (QID) | ORAL | 0 refills | Status: DC | PRN

## 2016-02-05 MED ORDER — HYDROMORPHONE HCL 1 MG/ML IJ SOLN
2.0000 mg | Freq: Once | INTRAMUSCULAR | Status: AC
  Administered 2016-02-05: 2 mg via INTRAVENOUS
  Filled 2016-02-05: qty 2

## 2016-02-05 MED ORDER — HYDROMORPHONE HCL 1 MG/ML IJ SOLN
1.0000 mg | Freq: Once | INTRAMUSCULAR | Status: DC
  Filled 2016-02-05: qty 1

## 2016-02-05 MED ORDER — CYCLOBENZAPRINE HCL 10 MG PO TABS: 10.0000 mg | ORAL_TABLET | Freq: Two times a day (BID) | ORAL | 0 refills | Status: AC | PRN

## 2016-02-05 NOTE — ED Provider Notes (Signed)
Patient fell 7 feet off of roof landing on his back and striking his head medially prior to coming here. He complains of midthoracic back pain, nonradiating, left thumb pain and left wrist pain since the event worse with changing positions he denies shortness of breath denies abdominal pain denies neck pain he complains of mild headache. Also complains of left thumb and left wrist pain since the event. No other injury. EMS treated patient with fentanyl 250 g IV and 500 mL normal saline intravenous bolus him about he was normotensive in the field and had stable vital signs. On exam alert Glasgow Coma Score 15 HEENT exam normocephalic atraumatic neck no step-off no point tenderness no bruit lungs clear breath sounds back he is tender overlying thoracic spine, no lumbar tenderness no contusion abrasion or deformity abdomen nontender pelvis stable. All 4 extremities without contusion abrasion or tenderness neurovascularly intact. Neurologic Glasgow Coma Score 15 cranial nerves II through XII grossly intact moves all extremities well motion 5 over 5 overall   Richard Dakin, MD 02/05/16 1603

## 2016-02-05 NOTE — ED Triage Notes (Addendum)
Patient came in by [facility]. Patient fell 7 feet off roof this afternoon. No loss of consciousness. Patient c/o of thoracic back, left wrist/thumb, and buttocks sharp, burning pain. No loss of sensation in any extremities and mobility present in all extremities. No vision loss. V/s stable. Neck brace on. 250 mcg of fentanyl given by EMS with 500 ml bolus. 18G IV in the RAC.

## 2016-02-05 NOTE — ED Notes (Signed)
Patient transported to CT 

## 2016-02-05 NOTE — ED Provider Notes (Signed)
[place] DEPT Provider Note   CSN: [id number] Arrival date & time: 02/05/16  1530     History   Chief Complaint No chief complaint on file.   HPI Richard Franco is a 50 y.o. male.  HPI  The patient was working on a roof today when he suffered a fall from the roof, onto the ground.  Point of impact was his back.  He complains of mid back pain, and left thumb pain.  He denies LOC, n/v.  Past Medical History:  Diagnosis Date  . Abnormal chest CT 03/2012   a. 2013: Scattered patchy ground glass opacities and pulmonary nodules. Transbronchial biopsy with no definite etiology of lung finding- possible organizing pneumonia. b. F/u CT 05/2014: stable multiple tiny pulm nodules, no further w/u recommended per notes.   . Anxiety   . Arthritis   . Chronic midline posterior neck pain   . Complication of anesthesia    " difficult to urinate after "  . Depression   . Esophageal stricture   . Fibromyalgia   . GERD (gastroesophageal reflux disease)   . Migraine headache    "none lately; I've had alot in my life" (06/21/2015)  . NSTEMI (non-ST elevated myocardial infarction) ([place]) 07/2012   a. Normal cath 03/2012. b. 07/2012: troponin 4, normal cors, ?vasospasm. Did not tolerate Imdur due to headache. On amlodipine.  . Testosterone deficiency   . Tobacco abuse     Patient Active Problem List   Diagnosis Date Noted  . Chest pain, atypical 11/16/2015  . Dyspnea 11/16/2015  . Pleuritic chest pain 06/22/2015  . Tobacco abuse 06/22/2015  . History of coronary vasospasm 06/22/2015  . Smoking history 07/19/2014  . Chronic fatigue 07/19/2014  . Chronic arthralgias of knees and hips 11/26/2012  . Hyperlipidemia 09/25/2012  . GERD (gastroesophageal reflux disease) 08/11/2012  . BPH (benign prostatic hyperplasia) 08/11/2012  . Abnormal immunological finding in serum 05/03/2012  . Pulmonary infiltrates 04/16/2012  . Pulmonary nodule 03/25/2012  . Abnormal CT lung screening 03/25/2012   . History of tobacco abuse 02/18/2012  . Fibromyalgia 02/18/2012  . Hx of migraine headaches 02/18/2012  . DYSPNEA 08/24/2009  . CHEST PAIN-UNSPECIFIED 08/24/2009  . ABFND, FALSE POSITIVE SEROLOGIC TEST, [facility] 01/12/2007  . OSTEOARTHRITIS 01/05/2007    Past Surgical History:  Procedure Laterality Date  . ANKLE FUSION Left 2006  . BACK SURGERY    . CERVICAL FUSION  X7 "last one 04/2011"  . CYSTECTOMY    . FRACTURE SURGERY    . LEFT HEART CATHETERIZATION WITH CORONARY ANGIOGRAM N/A 03/25/2012   Procedure: LEFT HEART CATHETERIZATION WITH CORONARY ANGIOGRAM;  Surgeon: Burnell Blanks, MD;  Location: [facility] CATH LAB;  Service: Cardiovascular;  Laterality: N/A;  . LEFT HEART CATHETERIZATION WITH CORONARY ANGIOGRAM N/A 08/11/2012   Procedure: LEFT HEART CATHETERIZATION WITH CORONARY ANGIOGRAM;  Surgeon: Peter M Martinique, MD;  Location: [facility] CATH LAB;  Service: Cardiovascular;  Laterality: N/A;  . POSTERIOR CERVICAL FUSION/FORAMINOTOMY  05/09/2011   Procedure: POSTERIOR CERVICAL FUSION/FORAMINOTOMY LEVEL 1;  Surgeon: Hosie Spangle;  Location: [place] NEURO ORS;  Service: Neurosurgery;  Laterality: N/A;  C4/5 posterior arthrodesis with instrumentation   . VIDEO BRONCHOSCOPY  05/06/2012   Procedure: VIDEO BRONCHOSCOPY WITH FLUORO;  Surgeon: Jyl Heinz, MD;  Location: [facility] ENDOSCOPY;  Service: Cardiopulmonary;  Laterality: Bilateral;       Home Medications    Prior to Admission medications   Medication Sig Start Date End Date Taking? Authorizing Provider  amLODipine (NORVASC) 5 MG  tablet Take 1 tablet (5 mg total) by mouth daily. 04/18/15   Larey Dresser, MD  aspirin EC 81 MG EC tablet Take 1 tablet (81 mg total) by mouth daily. 08/12/12   Dayna N Dunn, PA-C  busPIRone (BUSPAR) 10 MG tablet Take 10 mg by mouth 2 (two) times daily.    Historical Provider, MD  esomeprazole (NEXIUM) 40 MG capsule TAKE ONE CAPSULE BY MOUTH TWICE A DAY MEFORE MEALS 01/01/16   Mauri Pole, MD  FLUoxetine  (PROZAC) 20 MG capsule Take 40 mg by mouth daily.     Historical Provider, MD  lamoTRIgine (LAMICTAL) 100 MG tablet Take 100 mg by mouth 2 (two) times daily.    Historical Provider, MD  nitroGLYCERIN (NITROSTAT) 0.4 MG SL tablet DISSOLVE ONE TABLET UNDER THE TONGUE EVERY 5 MINUTES AS NEEDED FOR CHEST PAIN.  DO NOT EXCEED A TOTAL OF 3 DOSES IN 15 MINUTES 10/09/15   Larey Dresser, MD    Family History Family History  Problem Relation Age of Onset  . Diabetes Mother   . Coronary artery disease Paternal Uncle   . Asthma Sister     Social History Social History  Substance Use Topics  . Smoking status: Current Every Day Smoker    Packs/day: 0.75    Years: 27.50    Types: Cigarettes  . Smokeless tobacco: Former Systems developer    Types: Chew  . Alcohol use 12.6 oz/week    21 Cans of beer per week     Comment: 06/21/2015 "3 beers/day lately; more lately cause of being separated"      Allergies   Midodrine hcl and Sulfonamide derivatives   Review of Systems Review of Systems  Constitutional: Negative for chills and fever.  HENT: Negative for ear pain and sore throat.   Eyes: Negative for pain and visual disturbance.  Respiratory: Negative for cough and shortness of breath.   Cardiovascular: Negative for chest pain and palpitations.  Gastrointestinal: Negative for abdominal pain and vomiting.  Genitourinary: Negative for dysuria and hematuria.  Musculoskeletal: Positive for arthralgias and back pain. Negative for neck pain.  Skin: Negative for color change and rash.  Neurological: Negative for seizures and syncope.  All other systems reviewed and are negative.    Physical Exam Updated Vital Signs There were no vitals taken for this visit.  Physical Exam  Constitutional: He appears well-developed and well-nourished.  HENT:  Head: Normocephalic and atraumatic.  Eyes: Conjunctivae are normal.  Neck: Neck supple.  Cardiovascular: Normal rate and regular rhythm.   No murmur  heard. Pulmonary/Chest: Effort normal and breath sounds normal. No respiratory distress.  Abdominal: Soft. There is no tenderness.  Musculoskeletal: He exhibits no edema.  Tenderness to mid T spine, not tender to C or L spine.  No scaphoid tenderness bilaterally.  L hand: Inspection: no deformity or effusion ROM: full without pain Strength: 5/5 in flexion and 5/5 in extension of the thumb and all fingers Pulses: distal pulses intact Sensation: distal sensation intact  L hip: Inspection: no deformity or effusion, TTP over left greater trochanter ROM: full Strength: 5/5 in flexion and 5/5 in extension Pulses: distal pulses intact Sensation: distal sensation intact   Neurological: He is alert.  Skin: Skin is warm and dry.  Psychiatric: He has a normal mood and affect.  Nursing note and vitals reviewed.    ED Treatments / Results  Labs (all labs ordered are listed, but only abnormal results are displayed) Labs Reviewed - No data to  display  EKG  EKG Interpretation None       Radiology No results found.  Procedures Procedures (including critical care time)  Medications Ordered in ED Medications - No data to display   Initial Impression / Assessment and Plan / ED Course  I have reviewed the triage vital signs and the nursing notes.  Pertinent labs & imaging results that were available during my care of the patient were reviewed by me and considered in my medical decision making (see chart for details).  Clinical Course   Presents as a non-leveled trauma from a fall, as above.  ABC intact.  Exam, as above, with mid T spine and left pelvis tenderness.  XR performed and showed XR C-spine: no fracture XR T-spine: no fracture XR L-spine: no fracture CXR: no PTX, Hemothorax or obvious fractures PXR: no fractures  CT Chest performed for continued T spine pain and it identified T4-T10 SP fractures.  Neurologically intact.  Pain was controlled and patient was  ambulatory.  NSG called, and recommended TLSO brace.  This was ordered.  Patient to f/u with NSG in a week.  Doubt left hand/wrist injury given normal exam, as above.  Clinically low suspicion for intracranial abnormality given no head trauma on exam, non-focal neuro exam, acting normally, no nausea / vomiting.  I have reviewed this patient's information in the [place] for controlled substance prescriptions in the past 12 months and found them to have the following controlled substance prescriptions: none.  Opiates were prescribed for an acute, painful condition.  The patient was given information on side effects, and encouraged to use other, non-opiate pain medication primary, and only use opiate medicine sparingly.  We have discussed the discharge plan, including the plan for outpatient followup, and strict return precautions, including those that would require calling 911.      Final Clinical Impressions(s) / ED Diagnoses   Final diagnoses:  None    New Prescriptions New Prescriptions   No medications on file     Levada Schilling, MD 02/06/16 0001    Levada Schilling, MD 02/06/16 0005    Orlie Dakin, MD 02/06/16 [phone]

## 2016-02-06 ENCOUNTER — Telehealth: Payer: Self-pay | Admitting: Family Medicine

## 2016-02-06 NOTE — ED Notes (Signed)
[employer] rep at bedside to apply back brace

## 2016-02-07 ENCOUNTER — Other Ambulatory Visit: Payer: Self-pay | Admitting: Gastroenterology

## 2016-02-07 NOTE — Telephone Encounter (Signed)
Pt given appt with MMM tomorrow at 2:15.

## 2016-02-08 ENCOUNTER — Encounter: Payer: Self-pay | Admitting: Nurse Practitioner

## 2016-02-08 ENCOUNTER — Ambulatory Visit: Payer: Medicare Other | Admitting: Nurse Practitioner

## 2016-02-08 ENCOUNTER — Ambulatory Visit (INDEPENDENT_AMBULATORY_CARE_PROVIDER_SITE_OTHER): Payer: Medicare Other | Admitting: Nurse Practitioner

## 2016-02-08 VITALS — BP 101/66 | HR 95 | Temp 97.0°F | Ht 69.0 in | Wt 189.0 lb

## 2016-02-08 DIAGNOSIS — S22009D Unspecified fracture of unspecified thoracic vertebra, subsequent encounter for fracture with routine healing: Secondary | ICD-10-CM

## 2016-02-08 DIAGNOSIS — S22009A Unspecified fracture of unspecified thoracic vertebra, initial encounter for closed fracture: Secondary | ICD-10-CM | POA: Diagnosis not present

## 2016-02-08 MED ORDER — OXYCODONE-ACETAMINOPHEN 10-325 MG PO TABS: 1.0000 | ORAL_TABLET | Freq: Four times a day (QID) | ORAL | 0 refills | Status: AC | PRN

## 2016-02-08 NOTE — Patient Instructions (Signed)
Vertebral Fracture A vertebral fracture is a break in one of the bones that make up the spine (vertebrae). The vertebrae are stacked on top of each other to form the spinal column. They support the body and protect the spinal cord. The vertebral column has an upper part (cervical spine), a middle part (thoracic spine), and a lower part (lumbar spine). Most vertebral fractures occur in the thoracic spine or lumbar spine. There are three main types of vertebral fractures:  Flexion fracture. This happens when vertebrae collapse. Vertebrae can collapse:  In the front (compression fracture). This type of fracture is common in people who have a condition that causes their bones to be weak and brittle (osteoporosis). The fracture can make a person lose height.  In the front and back (axial burst fracture).  Extension fracture. This happens when an external force pulls apart the vertebrae.  Rotation fracture. This happens when the spine bends extremely in one direction. This type can cause a piece of a vertebra to break off (transverse process fracture) or move out of its normal position (fracture dislocation). This type of fracture has a high risk for spinal cord injury. Vertebral fractures can range from mild to very severe. The most severe types are those that cause the broken bones to move out of place (unstable) and those that injure or press on the spinal cord. CAUSES This condition is usually caused by a forceful injury. This type of injury commonly results from:  Car accidents.  Falling or jumping from a great height.  Collisions in contact sports.  Violent acts, such as an assault or a gunshot wound. RISK FACTORS This injury is more likely to happen to people who:  Have osteoporosis.  Participate in contact sports.  Are in situations that could result in falls or other violent injuries. SYMPTOMS Symptoms of this injury depend on the location and the type of fracture. The most common  symptom is back pain that gets worse with movement. You may also have trouble standing or walking. If a fracture has damaged your spinal cord or is pressing on it, you may also have:  Numbness.  Tingling.  Weakness.  Loss of movement.  Loss of bowel or bladder control. DIAGNOSIS This injury may be diagnosed based on symptoms, medical history, and a physical exam. You may also have imaging tests to confirm the diagnosis. These may include:  Spine X-ray.  CT scan.  MRI. TREATMENT Treatment for this injury depends on the type of fracture. If your fracture is stable and does not affect your spinal cord, it may heal with nonsurgical treatment, such as:  Taking pain medicine.  Wearing a cast or a brace.  Doing physical therapy exercises. If your vertebral fracture is unstable or it affects your spinal cord, you may need surgical treatment, such as:  Laminectomy. This procedure involves removing the part of a vertebra that is pushing on the spinal cord (spinal decompression surgery). Bone fragments may also be removed.  Spinal fusion. This procedure is used to stabilize an unstable fracture. Vertebrae may be joined together with a piece of bone from another part of your body (graft) and held in place with rods, plates, or screws.  Vertebroplasty. In this procedure, bone cement is used to rebuild collapsed vertebrae. HOME CARE INSTRUCTIONS General Instructions  Take medicines only as directed by your health care provider.  Do not drive or operate heavy machinery while taking pain medicine.  If directed, apply ice to the injured area:  Put  ice in a plastic bag.  Place a towel between your skin and the bag.  Leave the ice on for 30 minutes every two hours at first. Then apply the ice as needed.  Wear your neck brace or back brace as directed by your health care provider.  Do not drink alcohol. Alcohol can interfere with your treatment.  Keep all follow-up visits as directed  by your health care provider. This is important. It can help to prevent permanent injury, disability, and long-lasting (chronic) pain. Activity  Stay in bed (on bed rest) only as directed by your health care provider. Being on bed rest for too long can make your condition worse.  Return to your normal activities as directed by your health care provider. Ask what activities are safe for you.  Do exercises to improve motion and strength in your back (physical therapy), as recommended by your health care provider.   Exercise regularly as directed by your health care provider. SEEK MEDICAL CARE IF:  You have a fever.  You develop a cough that makes your pain worse.  Your pain medicine is not helping.  Your pain does not get better over time.  You cannot return to your normal activities as planned or expected. SEEK IMMEDIATE MEDICAL CARE IF:  Your pain is very bad and it suddenly gets worse.  You are unable to move any body part (paralysis) that is below the level of your injury.  You have numbness, tingling, or weakness in any body part that is below the level of your injury.  You cannot control your bladder or bowels.   This information is not intended to replace advice given to you by your health care provider. Make sure you discuss any questions you have with your health care provider.   Document Released: 07/18/2004 Document Revised: 10/25/2014 Document Reviewed: 06/15/2014 Elsevier Interactive Patient Education [employer].

## 2016-02-08 NOTE — Progress Notes (Signed)
   Subjective:    Patient ID: Richard Franco, male    DOB: Sep 04, 1965, 50 y.o.   MRN: [id number]  HPI Patient comes in saying Monday he fell off a roof and broke his back in 6 places. Having a lot of back pain. He was given flexeril and percocet #20 for pain. He says that pain is in a lot of pain. Rates pain 7/10 movement increases pain. pain is constant in mid back and does not radiate in either direction. He has a back brace on. He has a history of pain medication abuse.    Review of Systems  Constitutional: Negative.   HENT: Negative.   Respiratory: Negative.   Cardiovascular: Negative.   Gastrointestinal: Negative.   Genitourinary: Negative.   Neurological: Negative.   Psychiatric/Behavioral: Negative.   All other systems reviewed and are negative.      Objective:   Physical Exam  Constitutional: He is oriented to person, place, and time. He appears well-developed and well-nourished. No distress.  Cardiovascular: Normal rate, regular rhythm and normal heart sounds.   Pulmonary/Chest: Effort normal and breath sounds normal.  Musculoskeletal:  Cannot stand straight up Pain with movement in any direction in mid back- back brace in place DTR'S equal bil (-) SLR bil Motor strength and sensation distally intact   Neurological: He is alert and oriented to person, place, and time.  Skin: Skin is warm.  Psychiatric: He has a normal mood and affect. His behavior is normal. Judgment and thought content normal.    BP 101/66   Pulse 95   Temp 97 F (36.1 C) (Oral)   Ht 5\' 9"  (1.753 m)   Wt 189 lb (85.7 kg)   BMI 27.91 kg/m       Assessment & Plan:   1. Closed fracture of thoracic vertebra with routine healing, unspecified fracture morphology, unspecified thoracic vertebral level, subsequent encounter    Meds ordered this encounter  Medications  . oxyCODONE-acetaminophen (PERCOCET) 10-325 MG tablet    Sig: Take 1 tablet by mouth every 6 (six) hours as needed for pain.      Dispense:  40 tablet    Refill:  0    Order Specific Question:   Supervising Provider    Answer:   Eustaquio Maize [[id number]]   Keep appointment with specialist RTO prn  Mary-Margaret Hassell Done, FNP

## 2016-02-14 DIAGNOSIS — Z6826 Body mass index (BMI) 26.0-26.9, adult: Secondary | ICD-10-CM | POA: Diagnosis not present

## 2016-02-14 DIAGNOSIS — S22008A Other fracture of unspecified thoracic vertebra, initial encounter for closed fracture: Secondary | ICD-10-CM | POA: Diagnosis not present

## 2016-02-14 DIAGNOSIS — M546 Pain in thoracic spine: Secondary | ICD-10-CM | POA: Diagnosis not present

## 2016-02-28 ENCOUNTER — Emergency Department
Admission: EM | Admit: 2016-02-28 | Discharge: 2016-02-28 | Disposition: A | Discharge status: Home / Self Care | Payer: Medicare Other | Attending: Emergency Medicine | Admitting: Emergency Medicine

## 2016-02-28 ENCOUNTER — Emergency Department: Payer: Medicare Other

## 2016-02-28 ENCOUNTER — Encounter: Payer: Self-pay | Admitting: Emergency Medicine

## 2016-02-28 DIAGNOSIS — Z79899 Other long term (current) drug therapy: Secondary | ICD-10-CM | POA: Insufficient documentation

## 2016-02-28 DIAGNOSIS — F17210 Nicotine dependence, cigarettes, uncomplicated: Secondary | ICD-10-CM | POA: Diagnosis not present

## 2016-02-28 DIAGNOSIS — M549 Dorsalgia, unspecified: Secondary | ICD-10-CM | POA: Insufficient documentation

## 2016-02-28 DIAGNOSIS — R112 Nausea with vomiting, unspecified: Secondary | ICD-10-CM | POA: Diagnosis not present

## 2016-02-28 DIAGNOSIS — R002 Palpitations: Secondary | ICD-10-CM

## 2016-02-28 DIAGNOSIS — R0789 Other chest pain: Secondary | ICD-10-CM | POA: Diagnosis not present

## 2016-02-28 DIAGNOSIS — G8929 Other chronic pain: Secondary | ICD-10-CM | POA: Diagnosis not present

## 2016-02-28 DIAGNOSIS — Z791 Long term (current) use of non-steroidal anti-inflammatories (NSAID): Secondary | ICD-10-CM | POA: Diagnosis not present

## 2016-02-28 DIAGNOSIS — R5381 Other malaise: Secondary | ICD-10-CM | POA: Insufficient documentation

## 2016-02-28 DIAGNOSIS — R42 Dizziness and giddiness: Secondary | ICD-10-CM | POA: Diagnosis not present

## 2016-02-28 DIAGNOSIS — R1110 Vomiting, unspecified: Secondary | ICD-10-CM | POA: Diagnosis not present

## 2016-02-28 DIAGNOSIS — Z7982 Long term (current) use of aspirin: Secondary | ICD-10-CM | POA: Diagnosis not present

## 2016-02-28 DIAGNOSIS — R0602 Shortness of breath: Secondary | ICD-10-CM | POA: Insufficient documentation

## 2016-02-28 HISTORY — DX: Other injury of unspecified body region, initial encounter: T14.8XXA

## 2016-02-28 LAB — HEPATIC FUNCTION PANEL
ALT: 17 U/L (ref 17–63)
AST: 25 U/L (ref 15–41)
Albumin: 4.5 g/dL (ref 3.5–5.0)
Alkaline Phosphatase: 88 U/L (ref 38–126)
Bilirubin, Direct: <0.1 mg/dL — ABNORMAL LOW (ref 0.1–0.5)
Total Bilirubin: 0.4 mg/dL (ref 0.3–1.2)
Total Protein: 7.6 g/dL (ref 6.5–8.1)

## 2016-02-28 LAB — BASIC METABOLIC PANEL
Anion gap: 5 (ref 5–15)
BUN: 10 mg/dL (ref 6–20)
CO2: 28 mmol/L (ref 22–32)
Calcium: 9.3 mg/dL (ref 8.9–10.3)
Chloride: 103 mmol/L (ref 101–111)
Creatinine, Ser: 1.09 mg/dL (ref 0.61–1.24)
GFR calc Af Amer: >60 mL/min (ref >60)
GFR calc non Af Amer: >60 mL/min (ref >60)
Glucose, Bld: 110 mg/dL — ABNORMAL HIGH (ref 65–99)
Potassium: 3.6 mmol/L (ref 3.5–5.1)
Sodium: 136 mmol/L (ref 135–145)

## 2016-02-28 LAB — CBC
HCT: 44.7 % (ref 40.0–52.0)
Hemoglobin: 15.5 g/dL (ref 13.0–18.0)
MCH: 31.3 pg (ref 26.0–34.0)
MCHC: 34.6 g/dL (ref 32.0–36.0)
MCV: 90.4 fL (ref 80.0–100.0)
Platelets: 191 10*3/uL (ref 150–440)
RBC: 4.94 MIL/uL (ref 4.40–5.90)
RDW: 13.9 % (ref 11.5–14.5)
WBC: 7.1 10*3/uL (ref 3.8–10.6)

## 2016-02-28 LAB — TROPONIN I
Troponin I: <0.03 ng/mL (ref <0.03)
Troponin I: <0.03 ng/mL (ref <0.03)

## 2016-02-28 LAB — LIPASE, BLOOD: Lipase: 27 U/L (ref 11–51)

## 2016-02-28 MED ORDER — ACETAMINOPHEN 500 MG PO TABS
1000.0000 mg | ORAL_TABLET | Freq: Once | ORAL | Status: AC
  Administered 2016-02-28: 1000 mg via ORAL
  Filled 2016-02-28: qty 2

## 2016-02-28 MED ORDER — ASPIRIN 81 MG PO CHEW
324.0000 mg | CHEWABLE_TABLET | Freq: Once | ORAL | Status: AC
  Administered 2016-02-28: 324 mg via ORAL
  Filled 2016-02-28: qty 4

## 2016-02-28 MED ORDER — ONDANSETRON 4 MG PO TBDP
4.0000 mg | ORAL_TABLET | Freq: Once | ORAL | Status: AC
  Administered 2016-02-28: 4 mg via ORAL
  Filled 2016-02-28: qty 1

## 2016-02-28 NOTE — ED Triage Notes (Signed)
1 hour ago had episode while driving.  Heart racing, vomited.  Says history of heart attack.

## 2016-02-28 NOTE — ED Provider Notes (Signed)
[facility] Emergency Department Provider Note  ____________________________________________   First MD Initiated Contact with Patient 02/28/16 1737     (approximate)  I have reviewed the triage vital signs and the nursing notes.   HISTORY  Chief Complaint Palpitations and Emesis    HPI Richard Franco is a 50 y.o. male with a history of fibromyalgia, groundglass infiltration, pulmonary nodules, persistent tobacco abuse, habitual alcohol use, GERD, anxiety, a normal coronary catheterization in 2013, and prior admission to [facility] for presumed coronary artery vasospasm with an elevated troponin and then another admission about 9 months ago when he had a negative troponin and it was ruled to be unspecified pleuritic chest pain.  He presents today for evaluation of acute onset shortness of breath, chest discomfort, one episode of emesis, and general malaise.  He states that it feels somewhat like prior anxiety attacks but that he has not had anxiety attacks in a long time.  He was worried it might be his heart surgery thought he should come in for evaluation.  His symptoms are completely resolved at this time other than some mild nausea.  He reports that the symptoms onset was acute, the intensity was severe, nothing made it better or worse.  It lasted several minutes before he improved.   Past Medical History:  Diagnosis Date  . Abnormal chest CT 03/2012   a. 2013: Scattered patchy ground glass opacities and pulmonary nodules. Transbronchial biopsy with no definite etiology of lung finding- possible organizing pneumonia. b. F/u CT 05/2014: stable multiple tiny pulm nodules, no further w/u recommended per notes.   . Anxiety   . Arthritis   . Chronic midline posterior neck pain   . Complication of anesthesia    " difficult to urinate after "  . Depression   . Esophageal stricture   . Fibromyalgia   . Fracture    back  . GERD (gastroesophageal reflux  disease)   . Migraine headache    "none lately; I've had alot in my life" (06/21/2015)  . NSTEMI (non-ST elevated myocardial infarction) ([employer]) 07/2012   a. Normal cath 03/2012. b. 07/2012: troponin 4, normal cors, ?vasospasm. Did not tolerate Imdur due to headache. On amlodipine.  . Testosterone deficiency   . Tobacco abuse     Patient Active Problem List   Diagnosis Date Noted  . Chest pain, atypical 11/16/2015  . Dyspnea 11/16/2015  . Pleuritic chest pain 06/22/2015  . Tobacco abuse 06/22/2015  . History of coronary vasospasm 06/22/2015  . Smoking history 07/19/2014  . Chronic fatigue 07/19/2014  . Chronic arthralgias of knees and hips 11/26/2012  . Hyperlipidemia 09/25/2012  . GERD (gastroesophageal reflux disease) 08/11/2012  . BPH (benign prostatic hyperplasia) 08/11/2012  . Abnormal immunological finding in serum 05/03/2012  . Pulmonary infiltrates 04/16/2012  . Pulmonary nodule 03/25/2012  . Abnormal CT lung screening 03/25/2012  . History of tobacco abuse 02/18/2012  . Fibromyalgia 02/18/2012  . Hx of migraine headaches 02/18/2012  . DYSPNEA 08/24/2009  . CHEST PAIN-UNSPECIFIED 08/24/2009  . ABFND, FALSE POSITIVE SEROLOGIC TEST, [facility] 01/12/2007  . OSTEOARTHRITIS 01/05/2007    Past Surgical History:  Procedure Laterality Date  . ANKLE FUSION Left 2006  . BACK SURGERY    . CERVICAL FUSION  X7 "last one 04/2011"  . CYSTECTOMY    . FRACTURE SURGERY    . LEFT HEART CATHETERIZATION WITH CORONARY ANGIOGRAM N/A 03/25/2012   Procedure: LEFT HEART CATHETERIZATION WITH CORONARY ANGIOGRAM;  Surgeon: Burnell Blanks,  MD;  Location: [place] CATH LAB;  Service: Cardiovascular;  Laterality: N/A;  . LEFT HEART CATHETERIZATION WITH CORONARY ANGIOGRAM N/A 08/11/2012   Procedure: LEFT HEART CATHETERIZATION WITH CORONARY ANGIOGRAM;  Surgeon: Peter M Martinique, MD;  Location: [facility] CATH LAB;  Service: Cardiovascular;  Laterality: N/A;  . POSTERIOR CERVICAL FUSION/FORAMINOTOMY  05/09/2011    Procedure: POSTERIOR CERVICAL FUSION/FORAMINOTOMY LEVEL 1;  Surgeon: Hosie Spangle;  Location: [place] NEURO ORS;  Service: Neurosurgery;  Laterality: N/A;  C4/5 posterior arthrodesis with instrumentation   . VIDEO BRONCHOSCOPY  05/06/2012   Procedure: VIDEO BRONCHOSCOPY WITH FLUORO;  Surgeon: Jyl Heinz, MD;  Location: [facility] ENDOSCOPY;  Service: Cardiopulmonary;  Laterality: Bilateral;    Prior to Admission medications   Medication Sig Start Date End Date Taking? Authorizing Provider  amLODipine (NORVASC) 5 MG tablet Take 1 tablet (5 mg total) by mouth daily. Patient taking differently: Take 5 mg by mouth at bedtime.  04/18/15   Larey Dresser, MD  aspirin EC 81 MG EC tablet Take 1 tablet (81 mg total) by mouth daily. 08/12/12   Dayna N Dunn, PA-C  baclofen (LIORESAL) 10 MG tablet Take 10 mg by mouth 3 (three) times daily. 01/31/16   Historical Provider, MD  esomeprazole (NEXIUM) 40 MG capsule TAKE ONE CAPSULE BY MOUTH TWICE A DAY MEFORE MEALS 02/07/16   Mauri Pole, MD  FLUoxetine (PROZAC) 20 MG capsule Take 20 mg by mouth 2 (two) times daily.     Historical Provider, MD  ibuprofen (ADVIL,MOTRIN) 200 MG tablet Take 400-800 mg by mouth daily as needed (pain).    Historical Provider, MD  lamoTRIgine (LAMICTAL) 100 MG tablet Take 100 mg by mouth 2 (two) times daily.    Historical Provider, MD  naltrexone (DEPADE) 50 MG tablet Take 50 mg by mouth at bedtime.    Historical Provider, MD  nitroGLYCERIN (NITROSTAT) 0.4 MG SL tablet DISSOLVE ONE TABLET UNDER THE TONGUE EVERY 5 MINUTES AS NEEDED FOR CHEST PAIN.  DO NOT EXCEED A TOTAL OF 3 DOSES IN 15 MINUTES Patient taking differently: Place 0.4 mg under the tongue every 5 (five) minutes as needed for chest pain (do not exceed a total of 3 doses in 15 minutes).  10/09/15   Larey Dresser, MD  Polyvinyl Alcohol-Povidone (REFRESH OP) Place 1 drop into both eyes daily as needed (dry eyes/ irritation).    Historical Provider, MD  risperiDONE (RISPERDAL) 1  MG tablet Take 1 mg by mouth at bedtime.    Historical Provider, MD  thiamine (VITAMIN B-1) 100 MG tablet Take 100 mg by mouth daily.    Historical Provider, MD    Allergies Midodrine hcl; Sulfa antibiotics; and Sulfonamide derivatives  Family History  Problem Relation Age of Onset  . Diabetes Mother   . Coronary artery disease Paternal Uncle   . Asthma Sister     Social History Social History  Substance Use Topics  . Smoking status: Current Every Day Smoker    Packs/day: 0.75    Years: 27.50    Types: Cigarettes  . Smokeless tobacco: Former Systems developer    Types: Chew  . Alcohol use 12.6 oz/week    21 Cans of beer per week     Comment: 06/21/2015 "3 beers/day lately; more lately cause of being separated"     Review of Systems Constitutional: No fever/chills Eyes: No visual changes. ENT: No sore throat. Cardiovascular: +chest discomfort. Respiratory: +shortness of breath. Gastrointestinal: No abdominal pain.  +emesis x 1.  No diarrhea.  No  constipation. Genitourinary: Negative for dysuria. Musculoskeletal: Chronic back pain (prior spinous process fractures) Skin: Negative for rash. Neurological: Negative for headaches, focal weakness or numbness.  10-point ROS otherwise negative.  ____________________________________________   PHYSICAL EXAM:  VITAL SIGNS: ED Triage Vitals  Enc Vitals Group     BP 02/28/16 1701 (!) 141/87     Pulse Rate 02/28/16 1701 95     Resp 02/28/16 1701 18     Temp 02/28/16 1701 98 F (36.7 C)     Temp Source 02/28/16 1701 Oral     SpO2 02/28/16 1701 100 %     Weight 02/28/16 1701 180 lb (81.6 kg)     Height 02/28/16 1701 5\' 9"  (1.753 m)     Head Circumference --      Peak Flow --      Pain Score 02/28/16 1702 5     Pain Loc --      Pain Edu? --      Excl. in [place]? --     Constitutional: Alert and oriented. Well appearing and in no acute distress. Eyes: Conjunctivae are normal. PERRL. EOMI. Head: Atraumatic. Nose: No  congestion/rhinnorhea. Mouth/Throat: Mucous membranes are moist.  Oropharynx non-erythematous. Neck: No stridor.  No meningeal signs.   Cardiovascular: Normal rate, regular rhythm. Good peripheral circulation. Grossly normal heart sounds. Respiratory: Normal respiratory effort.  No retractions. Lungs CTAB. Gastrointestinal: Soft and nontender. No distention.  Musculoskeletal: No lower extremity tenderness nor edema. No gross deformities of extremities. Neurologic:  Normal speech and language. No gross focal neurologic deficits are appreciated.  Skin:  Skin is warm, dry and intact. No rash noted. Psychiatric: Mood and affect are slightly anxious but essentially normal. Speech and behavior are normal.  ____________________________________________   LABS (all labs ordered are listed, but only abnormal results are displayed)  Labs Reviewed  BASIC METABOLIC PANEL - Abnormal; Notable for the following:       Result Value   Glucose, Bld 110 (*)    All other components within normal limits  HEPATIC FUNCTION PANEL - Abnormal; Notable for the following:    Bilirubin, Direct <0.1 (*)    All other components within normal limits  CBC  TROPONIN I  LIPASE, BLOOD  TROPONIN I   ____________________________________________  EKG  ED ECG REPORT I, Chandrea Zellman, the attending physician, personally viewed and interpreted this ECG.  Date: 02/28/2016 EKG Time: 17:06 Rate: 86 Rhythm: normal sinus rhythm QRS Axis: normal Intervals: normal ST/T Wave abnormalities: normal Conduction Disturbances: none Narrative Interpretation: unremarkable  ____________________________________________  RADIOLOGY   Dg Chest 2 View  Result Date: 02/28/2016 CLINICAL DATA:  Vomiting and tachycardia EXAM: CHEST  2 VIEW COMPARISON:  02/05/2016 FINDINGS: Cardiac shadow is within normal limits. The lungs are well aerated bilaterally. No focal infiltrate or sizable effusion is seen. Mild interstitial changes are  noted bilaterally. No bony abnormality is seen. IMPRESSION: No acute abnormality noted. Electronically Signed   By: Inez Catalina M.D.   On: 02/28/2016 17:54    ____________________________________________   PROCEDURES  Procedure(s) performed:   Procedures   Critical Care performed: No ____________________________________________   INITIAL IMPRESSION / ASSESSMENT AND PLAN / ED COURSE  Pertinent labs & imaging results that were available during my care of the patient were reviewed by me and considered in my medical decision making (see chart for details).  The patient has had extensive pulmonary and cardiac workups in the past and they have all been reassuring.  He has a negative first troponin.  Given the acuity of the symptoms I will keep him for a second set but then I anticipate discharge with outpatient follow-up with his primary cardiologist.  I will give a full dose aspirin in the emergency department.  He is also getting Zofran 4 mg ODT for his persistent nausea.   Clinical Course  Comment By Time  Patient has been asymptomatic in the emergency department.  2 negative troponins and workup otherwise unremarkable.I gave my usual and customary return precautions. He agrees with plan Hinda Kehr, MD 09/06 2110    ____________________________________________  FINAL CLINICAL IMPRESSION(S) / ED DIAGNOSES  Final diagnoses:  Palpitations  Non-intractable vomiting with nausea, vomiting of unspecified type  Chest discomfort     MEDICATIONS GIVEN DURING THIS VISIT:  Medications  ondansetron (ZOFRAN-ODT) disintegrating tablet 4 mg (4 mg Oral Given 02/28/16 1827)  aspirin chewable tablet 324 mg (324 mg Oral Given 02/28/16 1826)  acetaminophen (TYLENOL) tablet 1,000 mg (1,000 mg Oral Given 02/28/16 2012)     NEW OUTPATIENT MEDICATIONS STARTED DURING THIS VISIT:  Discharge Medication List as of 02/28/2016  9:11 PM      Discharge Medication List as of 02/28/2016  9:11 PM       Discharge Medication List as of 02/28/2016  9:11 PM       Note:  This document was prepared using Dragon voice recognition software and may include unintentional dictation errors.    Hinda Kehr, MD 02/28/16 2252

## 2016-02-28 NOTE — ED Notes (Signed)
Patient pressed call button. He says he has a headache that suddenly became more severe than earlier. He describes the pain as sharp and above his eyes, with a pain level of 7 on a scale of 0-10. Patient is requesting pain medication. RN informed.

## 2016-02-28 NOTE — ED Notes (Signed)
MD at bedside. 

## 2016-02-28 NOTE — Discharge Instructions (Signed)
As we discussed, your workup today was reassuring.  Though we do not know exactly what is causing your symptoms, it appears that you have no emergent medical condition at this time are safe to go home and follow up as recommended in this paperwork.  Please return immediately to the Emergency Department if you develop any new or worsening symptoms that concern you.

## 2016-03-05 ENCOUNTER — Telehealth: Payer: Self-pay | Admitting: Gastroenterology

## 2016-03-05 ENCOUNTER — Other Ambulatory Visit: Payer: Self-pay | Admitting: Gastroenterology

## 2016-03-05 NOTE — Telephone Encounter (Signed)
Mr Durling call because his stomach is bothering him again. He wants to eat, but "everything makes me sick." He wants to go forward with an EGD. Is it okay to schedule him again. He has had a negative work up so far. He says he didn't know he had been scheduled for an EGD previously.

## 2016-03-05 NOTE — Telephone Encounter (Signed)
Ok to schedule EGD. Thanks

## 2016-03-06 ENCOUNTER — Other Ambulatory Visit: Payer: Self-pay

## 2016-03-06 DIAGNOSIS — G8929 Other chronic pain: Secondary | ICD-10-CM

## 2016-03-06 DIAGNOSIS — R1013 Epigastric pain: Principal | ICD-10-CM

## 2016-03-06 NOTE — Telephone Encounter (Signed)
Spoke with the patient this morning. He agrees to come in tomorrow for his pre-visit and have the EGD on 03/11/16.

## 2016-03-07 ENCOUNTER — Ambulatory Visit (AMBULATORY_SURGERY_CENTER): Payer: Self-pay | Admitting: *Deleted

## 2016-03-07 VITALS — Ht 69.0 in | Wt 180.0 lb

## 2016-03-07 DIAGNOSIS — R1013 Epigastric pain: Secondary | ICD-10-CM

## 2016-03-07 NOTE — Progress Notes (Signed)
No egg or soy allergy known to patient   No issues with past sedation with any surgeries  or procedures, no intubation problems - pt states he has to be cathed after general anesthesia   No diet pills per patient  No home 02 use per patient   No blood thinners per patient   Pt denies issues with constipation   No A fib or A flutter   02-28-2016 to ED- for Vomiting and tachycardia- ED work up negative- troponins negative, EKG NSR in the 80's- no further issues with this since this episode

## 2016-03-11 ENCOUNTER — Ambulatory Visit (AMBULATORY_SURGERY_CENTER): Payer: Medicare Other | Admitting: Gastroenterology

## 2016-03-11 ENCOUNTER — Encounter: Payer: Self-pay | Admitting: Gastroenterology

## 2016-03-11 VITALS — BP 103/58 | HR 64 | Temp 97.7°F | Resp 16 | Ht 69.0 in | Wt 180.0 lb

## 2016-03-11 DIAGNOSIS — G43A1 Cyclical vomiting, intractable: Secondary | ICD-10-CM | POA: Diagnosis not present

## 2016-03-11 DIAGNOSIS — R1115 Cyclical vomiting syndrome unrelated to migraine: Secondary | ICD-10-CM

## 2016-03-11 DIAGNOSIS — K219 Gastro-esophageal reflux disease without esophagitis: Secondary | ICD-10-CM | POA: Diagnosis not present

## 2016-03-11 DIAGNOSIS — R1013 Epigastric pain: Secondary | ICD-10-CM

## 2016-03-11 DIAGNOSIS — I2510 Atherosclerotic heart disease of native coronary artery without angina pectoris: Secondary | ICD-10-CM | POA: Diagnosis not present

## 2016-03-11 MED ORDER — ONDANSETRON HCL 4 MG PO TABS: 4.0000 mg | ORAL_TABLET | ORAL | 3 refills | Status: DC | PRN

## 2016-03-11 MED ORDER — HYOSCYAMINE SULFATE 0.125 MG SL SUBL: SUBLINGUAL_TABLET | SUBLINGUAL | 3 refills | Status: DC

## 2016-03-11 MED ORDER — SODIUM CHLORIDE 0.9 % IV SOLN: 500.0000 mL | INTRAVENOUS | Status: DC

## 2016-03-11 NOTE — Progress Notes (Signed)
Report given to PACU RN, vss 

## 2016-03-11 NOTE — Patient Instructions (Signed)
Impression/recommendations:  Normal exam  No aspirin, ibuprofen, naproxen, or other non-steroidal anti-inflammatory medications.  YOU HAD AN ENDOSCOPIC PROCEDURE TODAY AT [facility]:   Refer to the procedure report that was given to you for any specific questions about what was found during the examination.  If the procedure report does not answer your questions, please call your gastroenterologist to clarify.  If you requested that your care partner not be given the details of your procedure findings, then the procedure report has been included in a sealed envelope for you to review at your convenience later.  YOU SHOULD EXPECT: Some feelings of bloating in the abdomen. Passage of more gas than usual.  Walking can help get rid of the air that was put into your GI tract during the procedure and reduce the bloating. If you had a lower endoscopy (such as a colonoscopy or flexible sigmoidoscopy) you may notice spotting of blood in your stool or on the toilet paper. If you underwent a bowel prep for your procedure, you may not have a normal bowel movement for a few days.  Please Note:  You might notice some irritation and congestion in your nose or some drainage.  This is from the oxygen used during your procedure.  There is no need for concern and it should clear up in a day or so.  SYMPTOMS TO REPORT IMMEDIATELY:   Following upper endoscopy (EGD)  Vomiting of blood or coffee ground material  New chest pain or pain under the shoulder blades  Painful or persistently difficult swallowing  New shortness of breath  Fever of 100F or higher  Black, tarry-looking stools  For urgent or emergent issues, a gastroenterologist can be reached at any hour by calling [phone].   DIET:  We do recommend a small meal at first, but then you may proceed to your regular diet.  Drink plenty of fluids but you should avoid alcoholic beverages for 24 hours.  ACTIVITY:  You should plan to  take it easy for the rest of today and you should NOT DRIVE or use heavy machinery until tomorrow (because of the sedation medicines used during the test).    FOLLOW UP: Our staff will call the number listed on your records the next business day following your procedure to check on you and address any questions or concerns that you may have regarding the information given to you following your procedure. If we do not reach you, we will leave a message.  However, if you are feeling well and you are not experiencing any problems, there is no need to return our call.  We will assume that you have returned to your regular daily activities without incident.  If any biopsies were taken you will be contacted by phone or by letter within the next 1-3 weeks.  Please call us at [phone] if you have not heard about the biopsies in 3 weeks.    SIGNATURES/CONFIDENTIALITY: You and/or your care partner have signed paperwork which will be entered into your electronic medical record.  These signatures attest to the fact that that the information above on your After Visit Summary has been reviewed and is understood.  Full responsibility of the confidentiality of this discharge information lies with you and/or your care-partner.

## 2016-03-11 NOTE — Op Note (Addendum)
[facility] Patient Name: Richard Franco Procedure Date: 03/11/2016 10:29 AM MRN: [id number] Endoscopist: Mauri Pole , MD Age: 50 Referring MD:  Date of Birth: 31-Mar-1966 Gender: Male Account #: [id number] Procedure:                Upper GI endoscopy Indications:              Persistent vomiting of unknown cause, Upper                            abdominal symptoms that persist despite an                            appropriate trial of therapy Medicines:                Monitored Anesthesia Care Procedure:                Pre-Anesthesia Assessment:                           - Prior to the procedure, a History and Physical                            was performed, and patient medications and                            allergies were reviewed. The patient's tolerance of                            previous anesthesia was also reviewed. The risks                            and benefits of the procedure and the sedation                            options and risks were discussed with the patient.                            All questions were answered, and informed consent                            was obtained. Prior Anticoagulants: The patient has                            taken no previous anticoagulant or antiplatelet                            agents. ASA Grade Assessment: II - A patient with                            mild systemic disease. After reviewing the risks                            and benefits, the patient was deemed in  satisfactory condition to undergo the procedure.                           After obtaining informed consent, the endoscope was                            passed under direct vision. Throughout the                            procedure, the patient's blood pressure, pulse, and                            oxygen saturations were monitored continuously. The                            Model GIF-[id number]) scope  was introduced                            through the mouth, and advanced to the third part                            of duodenum. The upper GI endoscopy was                            accomplished without difficulty. The patient                            tolerated the procedure well. Scope In: Scope Out: Findings:                 The esophagus was normal.                           The stomach was normal except for small hiatal                            hernia                           The examined duodenum was normal. Complications:            No immediate complications. Estimated Blood Loss:     Estimated blood loss: none. Impression:               - Normal esophagus.                           - Normal stomach except for small hiatal hernia.                           - Normal examined duodenum.                           - No specimens collected. Recommendation:           - Patient has a contact number available for  emergencies. The signs and symptoms of potential                            delayed complications were discussed with the                            patient. Return to normal activities tomorrow.                            Written discharge instructions were provided to the                            patient.                           - Resume previous diet.                           - Continue present medications.                           - No aspirin, ibuprofen, naproxen, or other                            non-steroidal anti-inflammatory drugs.                           - No repeat upper endoscopy.                           - Return to GI office PRN.                           - Use Zofran (ondansetron) 4 mg PO daily PRN.                           - Use Levsin, NuLev (hyoscyamine) 0.125 mg 1-2 tabs                            SL q 6 hours PRN .                           - Probiotic VSL#3 1 capsule (112 Biliion units)                             daily x 3 months Mauri Pole, MD 03/11/2016 10:47:39 AM This report has been signed electronically.

## 2016-03-12 ENCOUNTER — Telehealth: Payer: Self-pay | Admitting: *Deleted

## 2016-03-12 NOTE — Telephone Encounter (Signed)
No answer, message left for the patient. 

## 2016-03-22 ENCOUNTER — Telehealth: Payer: Self-pay | Admitting: *Deleted

## 2016-03-22 MED ORDER — ESOMEPRAZOLE MAGNESIUM 40 MG PO CPDR: 40.0000 mg | DELAYED_RELEASE_CAPSULE | Freq: Two times a day (BID) | ORAL | 3 refills | Status: DC

## 2016-03-22 NOTE — Telephone Encounter (Signed)
Nexium 90 day request sent to pharmacy today

## 2016-04-22 ENCOUNTER — Encounter (HOSPITAL_COMMUNITY): Payer: Self-pay | Admitting: Emergency Medicine

## 2016-04-22 ENCOUNTER — Emergency Department (HOSPITAL_COMMUNITY)
Admission: EM | Admit: 2016-04-22 | Discharge: 2016-04-22 | Disposition: A | Discharge status: Home / Self Care | Payer: Medicare Other | Attending: Emergency Medicine | Admitting: Emergency Medicine

## 2016-04-22 ENCOUNTER — Emergency Department (HOSPITAL_COMMUNITY): Payer: Medicare Other

## 2016-04-22 DIAGNOSIS — F17210 Nicotine dependence, cigarettes, uncomplicated: Secondary | ICD-10-CM | POA: Diagnosis not present

## 2016-04-22 DIAGNOSIS — Z7982 Long term (current) use of aspirin: Secondary | ICD-10-CM | POA: Diagnosis not present

## 2016-04-22 DIAGNOSIS — I252 Old myocardial infarction: Secondary | ICD-10-CM | POA: Insufficient documentation

## 2016-04-22 DIAGNOSIS — R0789 Other chest pain: Secondary | ICD-10-CM | POA: Diagnosis not present

## 2016-04-22 DIAGNOSIS — R079 Chest pain, unspecified: Secondary | ICD-10-CM | POA: Diagnosis not present

## 2016-04-22 LAB — CBC WITH DIFFERENTIAL/PLATELET
Basophils Absolute: 0 10*3/uL (ref 0.0–0.1)
Basophils Relative: 1 %
Eosinophils Absolute: 0.1 10*3/uL (ref 0.0–0.7)
Eosinophils Relative: 3 %
HCT: 38.4 % — ABNORMAL LOW (ref 39.0–52.0)
Hemoglobin: 13.1 g/dL (ref 13.0–17.0)
Lymphocytes Relative: 31 %
Lymphs Abs: 1.4 10*3/uL (ref 0.7–4.0)
MCH: 30.1 pg (ref 26.0–34.0)
MCHC: 34.1 g/dL (ref 30.0–36.0)
MCV: 88.3 fL (ref 78.0–100.0)
Monocytes Absolute: 0.5 10*3/uL (ref 0.1–1.0)
Monocytes Relative: 10 %
Neutro Abs: 2.4 10*3/uL (ref 1.7–7.7)
Neutrophils Relative %: 55 %
Platelets: 193 10*3/uL (ref 150–400)
RBC: 4.35 MIL/uL (ref 4.22–5.81)
RDW: 13.7 % (ref 11.5–15.5)
WBC: 4.4 10*3/uL (ref 4.0–10.5)

## 2016-04-22 LAB — COMPREHENSIVE METABOLIC PANEL
ALT: 21 U/L (ref 17–63)
AST: 21 U/L (ref 15–41)
Albumin: 3.9 g/dL (ref 3.5–5.0)
Alkaline Phosphatase: 68 U/L (ref 38–126)
Anion gap: 7 (ref 5–15)
BUN: 13 mg/dL (ref 6–20)
CO2: 26 mmol/L (ref 22–32)
Calcium: 9 mg/dL (ref 8.9–10.3)
Chloride: 106 mmol/L (ref 101–111)
Creatinine, Ser: 1.21 mg/dL (ref 0.61–1.24)
GFR calc Af Amer: >60 mL/min (ref >60)
GFR calc non Af Amer: >60 mL/min (ref >60)
Glucose, Bld: 110 mg/dL — ABNORMAL HIGH (ref 65–99)
Potassium: 3.6 mmol/L (ref 3.5–5.1)
Sodium: 139 mmol/L (ref 135–145)
Total Bilirubin: 0.6 mg/dL (ref 0.3–1.2)
Total Protein: 6.1 g/dL — ABNORMAL LOW (ref 6.5–8.1)

## 2016-04-22 LAB — LIPASE, BLOOD: Lipase: 29 U/L (ref 11–51)

## 2016-04-22 LAB — I-STAT TROPONIN, ED
Troponin i, poc: 0 ng/mL (ref 0.00–0.08)
Troponin i, poc: 0 ng/mL (ref 0.00–0.08)

## 2016-04-22 MED ORDER — NAPROXEN 500 MG PO TABS: 500.0000 mg | ORAL_TABLET | Freq: Two times a day (BID) | ORAL | 0 refills | Status: DC

## 2016-04-22 MED ORDER — GI COCKTAIL ~~LOC~~
30.0000 mL | Freq: Once | ORAL | Status: AC
  Administered 2016-04-22: 30 mL via ORAL
  Filled 2016-04-22: qty 30

## 2016-04-22 MED ORDER — MORPHINE SULFATE (PF) 4 MG/ML IV SOLN
4.0000 mg | Freq: Once | INTRAVENOUS | Status: AC
  Administered 2016-04-22: 4 mg via INTRAVENOUS
  Filled 2016-04-22: qty 1

## 2016-04-22 MED ORDER — KETOROLAC TROMETHAMINE 30 MG/ML IJ SOLN
15.0000 mg | Freq: Once | INTRAMUSCULAR | Status: AC
  Administered 2016-04-22: 15 mg via INTRAVENOUS
  Filled 2016-04-22: qty 1

## 2016-04-22 MED ORDER — ONDANSETRON HCL 4 MG/2ML IJ SOLN
4.0000 mg | Freq: Once | INTRAMUSCULAR | Status: AC
  Administered 2016-04-22: 4 mg via INTRAVENOUS
  Filled 2016-04-22: qty 2

## 2016-04-22 NOTE — ED Provider Notes (Signed)
Hand-off from Delos Haring, PA-C.   See initial provider's note for full HPI. Briefly, patient is a 50 year old male with history of NSTEMI (2 years ago, normal cath, thought to be due to vasospasm), fractured vertebra (s/p fall 8 weeks ago), GERD who presents the ED with complaint of chest pain, onset 4:30 AM. Patient reports having constant central chest pain which he describes as sharp/burning sensation. Denies any aggravating or relieving factors. Reports drinking alcohol yesterday and reports having GERD like symptoms. Endorses nonproductive cough for the past few days. Denies fever, chills, HA, SOB, wheezing, abdominal pain, vomiting, leg swelling, weakness, back pain, numbness, tingling, weakness. Denies relief with nitroglycerin at home. Reports he has been taking his home medications (including amlodipine and nexium as prescribed). Endorses smoking 1PPD. Denies family hx of cardiac disease.  Cardiologist- Dr. Aundra Dubin  Physical Exam  BP 116/73   Pulse 72   Temp 97.5 F (36.4 C) (Oral)   Resp 14   Ht 5\' 9"  (1.753 m)   Wt 83 kg   SpO2 96%   BMI 27.02 kg/m   Physical Exam  Constitutional: He is oriented to person, place, and time. He appears well-developed and well-nourished. No distress.  HENT:  Head: Normocephalic and atraumatic.  Mouth/Throat: Oropharynx is clear and moist. No oropharyngeal exudate.  Eyes: Conjunctivae and EOM are normal. Right eye exhibits no discharge. Left eye exhibits no discharge. No scleral icterus.  Neck: Normal range of motion. Neck supple.  Cardiovascular: Normal rate, regular rhythm, normal heart sounds and intact distal pulses.   Pulmonary/Chest: Effort normal and breath sounds normal. No respiratory distress. He has no wheezes. He has no rales. He exhibits no tenderness.  Abdominal: Soft. Bowel sounds are normal. He exhibits no distension and no mass. There is no tenderness. There is no rebound and no guarding. No hernia.  Musculoskeletal: Normal  range of motion. He exhibits no edema, tenderness or deformity.  Full range of motion of neck and back. Full range of motion of bilateral upper and lower extremities, with 5/5 strength. Sensation intact. 2+ radial and PT pulses. Cap refill <2 seconds.   Neurological: He is alert and oriented to person, place, and time. He has normal strength. No sensory deficit.  Skin: Skin is warm and dry. He is not diaphoretic.  Nursing note and vitals reviewed.   ED Course  Procedures  MDM Patient presents with chest pain that started around 4:30 this morning while he was sleeping. History of NSTEMI (suspected vasospams, normal heart cath) and GERD. No relief with nitroglycerin at home. VSS.  Exam performed by initial provider was unremarkable. Patient given GI cocktail and Zofran. EKG, chest x-ray and labs ordered for evaluation of ACS versus GERD.  On my initial evaluation, patient is resting comfortably in bed. He continues to report having chest pain but states it has mildly improved after the medications. EKG showed sinus rhythm with no acute ischemic changes. Chest x-ray negative. Troponin negative. Labs unremarkable. HEART score 2. Patient reports he has not seen his cardiologist in over a year but notes he has been taking his home medications including amlodipine for vasospasms as prescribed. Plan to given pt pain meds and order delta trop.   Delta troponin negative. On reevaluation after patient received Toradol he reports his chest pain has significantly improved. He reports that he remembers having similar episodes of chest pain in the past when he was diagnosed with pleurisy. I have a low suspicion for ACS, PE, dissection, or other acute cardiac  event at this time. Plan to discharge patient home with NSAIDs and advised patient to continue taking his home meds including Nexium as prescribed. Advised patient to follow up with his cardiologist for follow-up regarding his chest pain. Discussed or return  precautions.       Chesley Noon [place], [place] 04/22/16 [place], MD 04/24/16 [phone]

## 2016-04-22 NOTE — ED Notes (Signed)
Pt states he understands instructions and will follow up as directed. Home stable with family with steady gait

## 2016-04-22 NOTE — ED Notes (Addendum)
Pt transported to XR at 0605.

## 2016-04-22 NOTE — Discharge Instructions (Signed)
Take your pain medications as prescribed as needed for pain relief. Continue taking your home medications as prescribed including her prescription of Nexium. I recommend refraining from eating large meals right before bed, drinking alcohol, eating or drinking things high caffeine to prevent exacerbation of your GERD. I recommend scheduling an appointment to follow-up with your cardiologist for reevaluation regarding your chest pain. Please return to the Emergency Department if symptoms worsen or new onset of fever, shortness of breath, productive cough, coughing up blood, wheezing, chest pain, vomiting, abdominal pain, lightheadedness, dizziness, syncope.

## 2016-04-22 NOTE — ED Triage Notes (Addendum)
Per pt, he was awoken from sleep with sharp centralized chest pain. Endorses shortness of breath, nausea. Denies diaphoresis. Pt took 1 NTG PTA without relief.

## 2016-04-22 NOTE — ED Provider Notes (Signed)
[facility] DEPT Provider Note   CSN: [id number] Arrival date & time: 04/22/16  0527  History   Chief Complaint Chief Complaint  Patient presents with  . Chest Pain    HPI Richard Franco is a 50 y.o. male.  HPI  Patient with a PMH of NSTEMI- normal cath, MI thought to be due to vasospasm and fractured vertebra after fall 8 weeks ago comes to the ER with acute onset of CP that started at 4:30 am, it has been constant, central chest, burning sensation with bilateral arm "numbness" sensations. He had alcohol yesterday last drink around 6 pm, he has been having GERD symptoms, has had cough, shortness of breath and nausea. Denies diaphoreses. He did not try any medication prior to arrival. No LE swelling, no back pain currently. He denies that anything makes the pain better or worse.      Past Medical History:  Diagnosis Date  . Abnormal chest CT 03/2012   a. 2013: Scattered patchy ground glass opacities and pulmonary nodules. Transbronchial biopsy with no definite etiology of lung finding- possible organizing pneumonia. b. F/u CT 05/2014: stable multiple tiny pulm nodules, no further w/u recommended per notes.   . Anxiety   . Arthritis   . Chronic midline posterior neck pain   . Complication of anesthesia    " difficult to urinate after "  . Depression   . Esophageal stricture   . Fibromyalgia   . Fracture    back  . GERD (gastroesophageal reflux disease)   . Migraine headache    "none lately; I've had alot in my life" (06/21/2015)  . NSTEMI (non-ST elevated myocardial infarction) ([facility]) 07/2012   a. Normal cath 03/2012. b. 07/2012: troponin 4, normal cors, ?vasospasm. Did not tolerate Imdur due to headache. On amlodipine.  . Testosterone deficiency   . Tobacco abuse     Patient Active Problem List   Diagnosis Date Noted  . Chest pain, atypical 11/16/2015  . Dyspnea 11/16/2015  . Pleuritic chest pain 06/22/2015  . Tobacco abuse 06/22/2015  . History of coronary  vasospasm 06/22/2015  . Smoking history 07/19/2014  . Chronic fatigue 07/19/2014  . Chronic arthralgias of knees and hips 11/26/2012  . Hyperlipidemia 09/25/2012  . GERD (gastroesophageal reflux disease) 08/11/2012  . BPH (benign prostatic hyperplasia) 08/11/2012  . Abnormal immunological finding in serum 05/03/2012  . Pulmonary infiltrates 04/16/2012  . Pulmonary nodule 03/25/2012  . Abnormal CT lung screening 03/25/2012  . History of tobacco abuse 02/18/2012  . Fibromyalgia 02/18/2012  . Hx of migraine headaches 02/18/2012  . DYSPNEA 08/24/2009  . CHEST PAIN-UNSPECIFIED 08/24/2009  . ABFND, FALSE POSITIVE SEROLOGIC TEST, [facility] 01/12/2007  . OSTEOARTHRITIS 01/05/2007    Past Surgical History:  Procedure Laterality Date  . ANKLE FUSION Left 2006  . BACK SURGERY    . CERVICAL FUSION  X7 "last one 04/2011"  . COLONOSCOPY    . CYSTECTOMY    . FRACTURE SURGERY    . LEFT HEART CATHETERIZATION WITH CORONARY ANGIOGRAM N/A 03/25/2012   Procedure: LEFT HEART CATHETERIZATION WITH CORONARY ANGIOGRAM;  Surgeon: Burnell Blanks, MD;  Location: [facility] CATH LAB;  Service: Cardiovascular;  Laterality: N/A;  . LEFT HEART CATHETERIZATION WITH CORONARY ANGIOGRAM N/A 08/11/2012   Procedure: LEFT HEART CATHETERIZATION WITH CORONARY ANGIOGRAM;  Surgeon: Peter M Martinique, MD;  Location: [facility] CATH LAB;  Service: Cardiovascular;  Laterality: N/A;  . POSTERIOR CERVICAL FUSION/FORAMINOTOMY  05/09/2011   Procedure: POSTERIOR CERVICAL FUSION/FORAMINOTOMY LEVEL 1;  Surgeon: Hosie Spangle;  Location: [facility];  Service: Neurosurgery;  Laterality: N/A;  C4/5 posterior arthrodesis with instrumentation   . UPPER GASTROINTESTINAL ENDOSCOPY    . VIDEO BRONCHOSCOPY  05/06/2012   Procedure: VIDEO BRONCHOSCOPY WITH FLUORO;  Surgeon: Jyl Heinz, MD;  Location: [facility] END[facility]COPY;  Service: Cardiopulmonary;  Laterality: Bilateral;       Home Medications    Prior to Admission medications   Medication Sig Start  Date End Date Taking? Authorizing Provider  amLODipine (NORVASC) 5 MG tablet Take 1 tablet (5 mg total) by mouth daily. Patient taking differently: Take 5 mg by mouth at bedtime.  04/18/15   Larey Dresser, MD  aspirin EC 81 MG EC tablet Take 1 tablet (81 mg total) by mouth daily. 08/12/12   Dayna N Dunn, PA-C  baclofen (LIORESAL) 10 MG tablet Take 10 mg by mouth 3 (three) times daily. 01/31/16   Historical Provider, MD  esomeprazole (NEXIUM) 40 MG capsule Take 1 capsule (40 mg total) by mouth 2 (two) times daily before a meal. 03/22/16   Mauri Pole, MD  FLUoxetine (PROZAC) 20 MG capsule Take 20 mg by mouth 2 (two) times daily.     Historical Provider, MD  hyoscyamine (LEVSIN SL) 0.125 MG SL tablet 1-2 tabs under the tongue every 6 hours as needed 03/11/16   Mauri Pole, MD  ibuprofen (ADVIL,MOTRIN) 200 MG tablet Take 400-800 mg by mouth daily as needed (pain).    Historical Provider, MD  lamoTRIgine (LAMICTAL) 100 MG tablet Take 100 mg by mouth 2 (two) times daily.    Historical Provider, MD  naltrexone (DEPADE) 50 MG tablet Take 50 mg by mouth at bedtime.    Historical Provider, MD  nitroGLYCERIN (NITROSTAT) 0.4 MG SL tablet DISSOLVE ONE TABLET UNDER THE TONGUE EVERY 5 MINUTES AS NEEDED FOR CHEST PAIN.  DO NOT EXCEED A TOTAL OF 3 DOSES IN 15 MINUTES Patient not taking: Reported on 03/07/2016 10/09/15   Larey Dresser, MD  ondansetron (ZOFRAN) 4 MG tablet Take 1 tablet (4 mg total) by mouth as needed for nausea or vomiting (once daily as needed). 03/11/16   Mauri Pole, MD  Polyvinyl Alcohol-Povidone (REFRESH OP) Place 1 drop into both eyes daily as needed (dry eyes/ irritation).    Historical Provider, MD  risperiDONE (RISPERDAL) 1 MG tablet Take 1 mg by mouth at bedtime.    Historical Provider, MD  thiamine (VITAMIN B-1) 100 MG tablet Take 100 mg by mouth daily.    Historical Provider, MD    Family History Family History  Problem Relation Age of Onset  . Diabetes Mother   .  Coronary artery disease Paternal Uncle   . Asthma Sister   . Colon cancer Neg Hx   . Colon polyps Neg Hx   . Rectal cancer Neg Hx   . Stomach cancer Neg Hx     Social History Social History  Substance Use Topics  . Smoking status: Current Every Day Smoker    Packs/day: 1.00    Years: 27.50    Types: Cigarettes  . Smokeless tobacco: Former Systems developer    Types: Chew  . Alcohol use No     Comment: 06/21/2015 "3 beers/day lately; more lately cause of being separated"      Allergies   Midodrine hcl; Sulfa antibiotics; and Sulfonamide derivatives   Review of Systems Review of Systems  Review of Systems All other systems negative except as documented in the HPI. All pertinent positives and negatives as reviewed  in the HPI.  Physical Exam Updated Vital Signs BP 133/79   Pulse 84   Temp 97.5 F (36.4 C) (Oral)   Resp 16   Ht 5\' 9"  (1.753 m)   Wt 83 kg   SpO2 99%   BMI 27.02 kg/m   Physical Exam  Constitutional: He appears well-developed and well-nourished. No distress.  HENT:  Head: Normocephalic and atraumatic.  Right Ear: Tympanic membrane and ear canal normal.  Left Ear: Tympanic membrane and ear canal normal.  Nose: Nose normal.  Mouth/Throat: Uvula is midline, oropharynx is clear and moist and mucous membranes are normal.  Eyes: Pupils are equal, round, and reactive to light.  Neck: Normal range of motion. Neck supple.  Cardiovascular: Normal rate and regular rhythm.   Pulmonary/Chest: Effort normal.  Abdominal: Soft.  No signs of abdominal distention  Musculoskeletal:  No LE swelling  Neurological: He is alert.  Acting at baseline  Skin: Skin is warm and dry. No rash noted.  Nursing note and vitals reviewed.    ED Treatments / Results  Labs (all labs ordered are listed, but only abnormal results are displayed) Labs Reviewed  CBC WITH DIFFERENTIAL/PLATELET  COMPREHENSIVE METABOLIC PANEL  LIPASE, BLOOD  I-STAT TROPOININ, ED    EKG  EKG  Interpretation  Date/Time:  Monday April 22 2016 05:34:08 EDT Ventricular Rate:  85 PR Interval:    QRS Duration: 96 QT Interval:  390 QTC Calculation: 464 R Axis:   80 Text Interpretation:  Sinus rhythm No significant change since last tracing Confirmed by HORTON  MD, Loma Sousa ([id number]) on 04/22/2016 5:40:48 AM       Radiology No results found.  Procedures Procedures (including critical care time)  Medications Ordered in ED Medications  ondansetron (ZOFRAN) injection 4 mg (not administered)  gi cocktail (Maalox,Lidocaine,Donnatal) (not administered)     Initial Impression / Assessment and Plan / ED Course  I have reviewed the triage vital signs and the nursing notes.  Pertinent labs & imaging results that were available during my care of the patient were reviewed by me and considered in my medical decision making (see chart for details).  Clinical Course    Patient etiology of pain is currently unclear ACS is unlikely due to clean cath but he has had NSTEMI before due to vasospasm, he said this does not feel the same. He has been coughing and has had SOB, could be lung infection. Could be GERD/abdominal related pain.   At end of shift patient sign out to [employer], PA-C. Patient currently early in work-up and has not had any lab/images results. Normal EKG, discussed pt briefly with Dr. Dina Rich- Gi cocktail and Zofran given as initial therapy.  Final Clinical Impressions(s) / ED Diagnoses   Final diagnoses:  None    New Prescriptions New Prescriptions   No medications on file     Delos Haring, PA-C 04/22/16 0600    Delos Haring, PA-C 04/22/16 [id number]    Merryl Hacker, MD 04/24/16 [phone]

## 2016-04-23 NOTE — Progress Notes (Signed)
CARDIOLOGY OFFICE NOTE  Date:  04/24/2016    [name] Date of Birth: 1966-06-11 Medical Record [id number]  PCP:  Redge Gainer, MD  Cardiologist:  Annabell Howells    Chief Complaint  Patient presents with  . Chest Pain    Work in/follow up visit - seen for Dr. Aundra Dubin    History of Present Illness: Richard Franco is a 50 y.o. male who presents today for a one year check/post ER visit.  Seen for Dr. Aundra Dubin.   He has a history of chest pain and ground glass infiltrates on lung imaging and was initially admitted in the past with pleuritic chest pain and exertional dyspnea in 10/13. He ended up having a left heart cath, which showed no significant coronary disease. Echo was normal-appearing. CTA of the chest, however, showed diffuse ground glass infiltrates. Patient was treated with steroids and has been followed by pulmonary. The steroids significantly improved his symptoms. He had a transbronchial biopsy with no definite etiology lung findings noted, possible organizing pneumonia. He was actually referred to pulmonary at [facility]. He says they just told him that whatever process it was that he had seemed to have resolved. He was readmitted in 2/14 with chest pain and had troponin elevation to 4.42. LHC was done again, again demonstrating normal coronaries. Due to concern for coronary vasospasm, he was started on Imdur but was unable to tolerate due to headache so diltiazem CD was begun and titrated up to 360 mg daily, this was transitioned eventually to amlodipine. He also had polyarthralgias and was referred to rheumatology. Autoimmune workup was negative. He was seen by pulmonary and had a CPX, suggesting deconditioning as a cause for his exertional dyspnea.   Last seen back in June of 2015 by Dr. Aundra Dubin. Rare chest pain noted at that time.   I saw him back in September of 2016 - back smoking - having more chest pain. Some palpitations. Not exercising  regularly. Using more sl NTG. I put him on Lopressor - he was going to consider rechallenging with Imdur. Last visit with me was back in November of 2016. Did not tolerate the metoprolol - made him too tired - took about 1/2 the prescription. He was otherwise felt to be doing ok. Had stopped smoking but admitted he could do better with general health measures.   In the ER earlier this week with chest pain - had had alcohol intake the day prior. Had GERD symptoms as well as a cough. Back smoking. Discharged with NSAIDs and advised to follow up here. EKG negative. Troponins normal.   Comes in today. Here alone. Feels like he has pleurisy again - notes similar presentation with his "ground glass presentation" that he had several years ago. Wonders if he needs a CT scan. His chest hurts with deep inspiration. Non productive cough. Has had this for about 2 months now - got worse on Sunday night and he panicked and went on the ER. Now on NSAID.  Leaving for [place] later this month. Not very active. Has stopped smoking since Sunday but asking for patches to have on hand. Labs are all up to date except for his lipids. He is fasting. Has not really tolerated any other cardiac medicines except for Norvasc.   Past Medical History:  1. Headache  2. Osteoarthritis  3. Depression  4. Chronic cervical spine disease, status post multiple surgeries with chronic pain  5. Insomnia, late onset of sleep, and he sleeps  until late in the morning  6. Tobacco abuse: Quit 10/13.  7. GERD  8. Chest pain: ETT-myoview (5/07): No ischemia or infarction. ETT (3/11) normal. LHC (10/13): EF 60%, normal coronary arteries. Admit with CP in 2/14, troponin 4.4. LHC (2/14) with normal coronaries. Possible coronary vasospasm, improved with calcium channel blockers.  9. Echo (10/13) with EF 55-60%, normal RV, normal valves.  10. Ground glass lung infiltrates: Admitted with pleuritic chest pain in 10/13. CTA chest showed  multiple areas of ground glass opacity. Transbronchial biopsy unrevealing. Improved with steroids. Ddx = infection versus eosinophilic PNA versus organizing PNA versus pneumoconiosis.  11. Palpitations 12. Hyperlipidemia 13. Polyarthralgia of uncertain etiology: Negative workup by rheumatology.  14. CPX (9/14): peak VO2 25.6 (76% predicted), VE/VCO2 31.2, RER 1.28, dyspnea thought due to deconditioning.    Past Medical History:  Diagnosis Date  . Abnormal chest CT 03/2012   a. 2013: Scattered patchy ground glass opacities and pulmonary nodules. Transbronchial biopsy with no definite etiology of lung finding- possible organizing pneumonia. b. F/u CT 05/2014: stable multiple tiny pulm nodules, no further w/u recommended per notes.   . Anxiety   . Arthritis   . Chronic midline posterior neck pain   . Complication of anesthesia    " difficult to urinate after "  . Depression   . Esophageal stricture   . Fibromyalgia   . Fracture    back  . GERD (gastroesophageal reflux disease)   . Migraine headache    "none lately; I've had alot in my life" (06/21/2015)  . NSTEMI (non-ST elevated myocardial infarction) ([place]) 07/2012   a. Normal cath 03/2012. b. 07/2012: troponin 4, normal cors, ?vasospasm. Did not tolerate Imdur due to headache. On amlodipine.  . Testosterone deficiency   . Tobacco abuse     Past Surgical History:  Procedure Laterality Date  . ANKLE FUSION Left 2006  . BACK SURGERY    . CERVICAL FUSION  X7 "last one 04/2011"  . COLONOSCOPY    . CYSTECTOMY    . FRACTURE SURGERY    . LEFT HEART CATHETERIZATION WITH CORONARY ANGIOGRAM N/A 03/25/2012   Procedure: LEFT HEART CATHETERIZATION WITH CORONARY ANGIOGRAM;  Surgeon: Burnell Blanks, MD;  Location: [facility] CATH LAB;  Service: Cardiovascular;  Laterality: N/A;  . LEFT HEART CATHETERIZATION WITH CORONARY ANGIOGRAM N/A 08/11/2012   Procedure: LEFT HEART CATHETERIZATION WITH CORONARY ANGIOGRAM;  Surgeon: Peter M Martinique, MD;   Location: [facility] CATH LAB;  Service: Cardiovascular;  Laterality: N/A;  . POSTERIOR CERVICAL FUSION/FORAMINOTOMY  05/09/2011   Procedure: POSTERIOR CERVICAL FUSION/FORAMINOTOMY LEVEL 1;  Surgeon: Hosie Spangle;  Location: [facility] NEURO ORS;  Service: Neurosurgery;  Laterality: N/A;  C4/5 posterior arthrodesis with instrumentation   . UPPER GASTROINTESTINAL ENDOSCOPY    . VIDEO BRONCHOSCOPY  05/06/2012   Procedure: VIDEO BRONCHOSCOPY WITH FLUORO;  Surgeon: Jyl Heinz, MD;  Location: [facility] ENDOSCOPY;  Service: Cardiopulmonary;  Laterality: Bilateral;     Medications: Current Outpatient Prescriptions  Medication Sig Dispense Refill  . amLODipine (NORVASC) 5 MG tablet Take 1 tablet (5 mg total) by mouth daily. (Patient taking differently: Take 5 mg by mouth at bedtime. ) 30 tablet 11  . aspirin EC 81 MG EC tablet Take 1 tablet (81 mg total) by mouth daily.    Marland Kitchen esomeprazole (NEXIUM) 40 MG capsule Take 1 capsule (40 mg total) by mouth 2 (two) times daily before a meal. 180 capsule 3  . FLUoxetine (PROZAC) 20 MG capsule Take 20 mg by mouth 2 (  two) times daily.     . hyoscyamine (LEVSIN SL) 0.125 MG SL tablet 1-2 tabs under the tongue every 6 hours as needed (Patient taking differently: Take 0.25-0.5 mg by mouth every 6 (six) hours as needed for cramping. ) 90 tablet 3  . ibuprofen (ADVIL,MOTRIN) 200 MG tablet Take 400-800 mg by mouth daily as needed (pain).    Marland Kitchen lamoTRIgine (LAMICTAL) 100 MG tablet Take 100 mg by mouth 2 (two) times daily.    . naproxen (NAPROSYN) 500 MG tablet Take 1 tablet (500 mg total) by mouth 2 (two) times daily. 30 tablet 0  . nitroGLYCERIN (NITROSTAT) 0.4 MG SL tablet DISSOLVE ONE TABLET UNDER THE TONGUE EVERY 5 MINUTES AS NEEDED FOR CHEST PAIN.  DO NOT EXCEED A TOTAL OF 3 DOSES IN 15 MINUTES 25 tablet 6  . ondansetron (ZOFRAN) 4 MG tablet Take 1 tablet (4 mg total) by mouth as needed for nausea or vomiting (once daily as needed). 30 tablet 3  . Polyvinyl Alcohol-Povidone (REFRESH  OP) Place 1 drop into both eyes daily as needed (dry eyes/ irritation).    . risperiDONE (RISPERDAL) 1 MG tablet Take 1 mg by mouth at bedtime.     Current Facility-Administered Medications  Medication Dose Route Frequency Provider Last Rate Last Dose  . 0.9 %  sodium chloride infusion  500 mL Intravenous Continuous Mauri Pole, MD        Allergies: Allergies  Allergen Reactions  . Midodrine Hcl Swelling    Tongue swelling  . Sulfa Antibiotics Rash  . Sulfonamide Derivatives Rash         Social History: The patient  reports that he quit smoking 3 days ago. His smoking use included Cigarettes. He has a 27.50 pack-year smoking history. He has quit using smokeless tobacco. His smokeless tobacco use included Chew. He reports that he does not drink alcohol or use drugs.   Family History: The patient's family history includes Asthma in his sister; Coronary artery disease in his paternal uncle; Diabetes in his mother.   Review of Systems: Please see the history of present illness.   Otherwise, the review of systems is positive for none.   All other systems are reviewed and negative.   Physical Exam: VS:  BP 110/78   Pulse 73   Ht 5\' 9"  (1.753 m)   Wt 185 lb 12.8 oz (84.3 kg)   SpO2 98% Comment: at rest and walking  BMI 27.44 kg/m  .  BMI Body mass index is 27.44 kg/m.  Wt Readings from Last 3 Encounters:  04/24/16 185 lb 12.8 oz (84.3 kg)  04/22/16 183 lb (83 kg)  03/11/16 180 lb (81.6 kg)    General: Pleasant. Alert and in no acute distress. Seems older than his stated age.   HEENT: Normal.  Neck: Supple, no JVD, carotid bruits, or masses noted.  Cardiac: Regular rate and rhythm. No murmurs, rubs, or gallops. No edema.  Respiratory:  Lungs are clear to auscultation bilaterally with normal work of breathing.  GI: Soft and nontender.  MS: No deformity or atrophy. Gait and ROM intact.  Skin: Warm and dry. Color is normal.  Neuro:  Strength and sensation are intact and  no gross focal deficits noted.  Psych: Alert, appropriate and with normal affect.   LABORATORY DATA:  EKG:  EKG is ordered today. This demonstrates NSR.   Lab Results  Component Value Date   WBC 4.4 04/22/2016   HGB 13.1 04/22/2016   HCT 38.4 (L)  04/22/2016   PLT 193 04/22/2016   GLUCOSE 110 (H) 04/22/2016   CHOL 202 (H) 03/22/2015   TRIG 116.0 03/22/2015   HDL 58.80 03/22/2015   LDLCALC 120 (H) 03/22/2015   ALT 21 04/22/2016   AST 21 04/22/2016   NA 139 04/22/2016   K 3.6 04/22/2016   CL 106 04/22/2016   CREATININE 1.21 04/22/2016   BUN 13 04/22/2016   CO2 26 04/22/2016   TSH 1.30 10/13/2015   INR 0.99 08/10/2012   HGBA1C 5.5 11/14/2015    BNP (last 3 results) No results for input(s): BNP in the last 8760 hours.  ProBNP (last 3 results) No results for input(s): PROBNP in the last 8760 hours.   Other Studies Reviewed Today:  CPX Conclusion from 02/2013: Exercise testing with gas exchange demonstrates a mild functional limitation when compared to matched sedentary norms. The patient's tidal volume during exercise appears to be a limiting factor, couple with the low respiratory rate. The elevated tidal volume during exercise can induce a sensation of not being able to get a full breath and may cause chest discomfort. There also appears to be significant deconditioning. ^^^Preliminary CPX Results, Finalized results will be forwarded when completed by interpreting physician.^^^  Prepared by: Linzie Collin, MEd, ACSM-RCEP Sr. Exercise Physiologist 02/23/2013 2:52 PM   Echo Study Conclusions from 03/2012  - Left ventricle: The cavity size was normal. Wall thickness was normal. Systolic function was normal. The estimated ejection fraction was in the range of 55% to 60%. Wall motion was normal; there were no regional wall motion abnormalities. Doppler parameters are consistent with abnormal left ventricular relaxation (grade 1  diastolic dysfunction). - Aortic valve: There was no stenosis. - Mitral valve: No significant regurgitation. - Right ventricle: The cavity size was normal. Systolic function was normal. - Pulmonary arteries: No complete TR doppler jet so unable to estimate PA systolic pressure. - Inferior vena cava: The vessel was normal in size; the respirophasic diameter changes were in the normal range (= 50%); findings are consistent with normal central venous pressure. - Pericardium, extracardiac: There was no pericardial effusion. Impressions:  - Normal LV size and systolic function, EF 0000000. Normal RV size and systolic function. No significant valvular abnormalities. No pericardial effusion.  Coronary angiography from 07/2012: Coronary dominance: left  Left mainstem: Short, normal.  Left anterior descending (LAD): Normal.  Left circumflex (LCx): Dominant, normal.  Right coronary artery (RCA): small, nondominant, normal.  Left ventriculography: Left ventricular systolic function is normal, LVEF is estimated at 55-65%, there is no significant mitral regurgitation   Final Conclusions:  1. Normal coronary anatomy. 2. Normal LV function.  Recommendations: treat empirically for spasm.  Collier Salina [facility] 08/11/2012, 1:52 PM  Assessment/Plan: 1. Coronary vasospasm: needs to really work on risk factors. His current chest pain seems more pleurisy like. Will get him back to see pulmonary.   2. Palpitations:  Not really an issue at this time. HR fine today on EKG  3. History of ground glass infiltrates: Undifferentiated lung disease. will get him back to pulmonary - Patricia Nettle, NP can see tomorrow. Defer Ct scan to pulmonary.   4. Fatigue: This is chronic.  Still suspect deconditioning (supported by CPX in 9/14). Suggested again that he start exercising, would like to see him walk 4-5 times/week.  5. HLD - recheck lipids today.   6. Tobacco  abuse - has stopped since Sunday - he has stopped cold [place] before in the past. Will let him have RX for  patches if needed.   Current medicines are reviewed with the patient today.  The patient does not have concerns regarding medicines other than what has been noted above.  The following changes have been made:  See above.  Labs/ tests ordered today include:    Orders Placed This Encounter  Procedures  . Lipid panel  . Ambulatory referral to Pulmonology  . EKG 12-Lead     Disposition:   FU with me in about 4 months.   Patient is agreeable to this plan and will call if any problems develop in the interim.   Signed: Burtis Junes, RN, ANP-C 04/24/2016 8:26 AM  [facility] [address] [place], [place]  [address] Phone: [phone] Fax: [phone]

## 2016-04-24 ENCOUNTER — Ambulatory Visit (INDEPENDENT_AMBULATORY_CARE_PROVIDER_SITE_OTHER): Payer: Medicare Other | Admitting: Nurse Practitioner

## 2016-04-24 ENCOUNTER — Encounter: Payer: Self-pay | Admitting: Nurse Practitioner

## 2016-04-24 VITALS — BP 110/78 | HR 73 | Ht 69.0 in | Wt 185.8 lb

## 2016-04-24 DIAGNOSIS — R0789 Other chest pain: Secondary | ICD-10-CM

## 2016-04-24 DIAGNOSIS — E7800 Pure hypercholesterolemia, unspecified: Secondary | ICD-10-CM | POA: Diagnosis not present

## 2016-04-24 LAB — LIPID PANEL
Cholesterol: 174 mg/dL (ref 125–200)
HDL: 44 mg/dL (ref >=40)
LDL Cholesterol: 109 mg/dL (ref <130)
Total CHOL/HDL Ratio: 4 Ratio (ref <=5.0)
Triglycerides: 107 mg/dL (ref <150)
VLDL: 21 mg/dL (ref <30)

## 2016-04-24 MED ORDER — NICOTINE 21 MG/24HR TD PT24
21.0000 mg | MEDICATED_PATCH | Freq: Every day | TRANSDERMAL | 0 refills | Status: DC
Start: 2016-04-24 — End: 2016-07-04

## 2016-04-24 MED ORDER — NICOTINE 21 MG/24HR TD PT24: 21.0000 mg | MEDICATED_PATCH | Freq: Every day | TRANSDERMAL | 0 refills | Status: DC

## 2016-04-24 NOTE — Patient Instructions (Addendum)
We will be checking the following labs today - Lipids   Medication Instructions:    Continue with your current medicines.   I am giving you a RX for Nicoderm patches to use if needed - do not smoke while using the patches    Testing/Procedures To Be Arranged:  N/A  Follow-Up:   See me in 4 months  Will get you a visit with Dr. Chase Caller or Patricia Nettle with pulmonary - as soon as possible    Other Special Instructions:   Keep working on the smoking.     If you need a refill on your cardiac medications before your next appointment, please call your pharmacy.   Call the [place] office at [phone] if you have any questions, problems or concerns.

## 2016-04-25 ENCOUNTER — Other Ambulatory Visit (INDEPENDENT_AMBULATORY_CARE_PROVIDER_SITE_OTHER): Payer: Medicare Other

## 2016-04-25 ENCOUNTER — Ambulatory Visit (INDEPENDENT_AMBULATORY_CARE_PROVIDER_SITE_OTHER): Payer: Medicare Other | Admitting: Adult Health

## 2016-04-25 ENCOUNTER — Encounter: Payer: Self-pay | Admitting: Adult Health

## 2016-04-25 VITALS — BP 110/70 | HR 76 | Temp 97.7°F | Ht 69.0 in | Wt 187.2 lb

## 2016-04-25 DIAGNOSIS — R0602 Shortness of breath: Secondary | ICD-10-CM | POA: Diagnosis not present

## 2016-04-25 DIAGNOSIS — R0600 Dyspnea, unspecified: Secondary | ICD-10-CM

## 2016-04-25 DIAGNOSIS — R911 Solitary pulmonary nodule: Secondary | ICD-10-CM | POA: Diagnosis not present

## 2016-04-25 LAB — SEDIMENTATION RATE: Sed Rate: 7 mm/hr (ref 0–15)

## 2016-04-25 MED ORDER — PREDNISONE 10 MG PO TABS: ORAL_TABLET | ORAL | 1 refills | Status: DC

## 2016-04-25 MED ORDER — ALBUTEROL SULFATE HFA 108 (90 BASE) MCG/ACT IN AERS: 2.0000 | INHALATION_SPRAY | Freq: Four times a day (QID) | RESPIRATORY_TRACT | 6 refills | Status: DC | PRN

## 2016-04-25 NOTE — Progress Notes (Signed)
Pt. Returned call and was informed of TP message. Pt. Had no further questions.

## 2016-04-25 NOTE — Patient Instructions (Addendum)
Begin Prednisone 20mg  daily for 2 week then 10mg  daily and hold at this dose  Labs today .  May use ProAir 2 puffs every 4hr as needed for wheezing or shortness of breath .  Follow up Dr. Chase Caller in 6 weeks and As needed   Please contact office for sooner follow up if symptoms do not improve or worsen or seek emergency care

## 2016-04-25 NOTE — Assessment & Plan Note (Signed)
No airflow obstruction on spriometry  Smoking cessation  ? GGO contributing +/- medication side effects.  Steroid trial Richard Franco ESR /repeat Ct chest going forward   Plan  Patient Instructions  Begin Prednisone 12m daily for 2 week then 179mdaily and hold at this dose  Labs today .  May use ProAir 2 puffs every 4hr as needed for wheezing or shortness of breath .  Follow up Dr. RaChase Callern 6 weeks and As needed   Please contact office for sooner follow up if symptoms do not improve or worsen or seek emergency care

## 2016-04-25 NOTE — Assessment & Plan Note (Addendum)
GGO found on CT chest previously steroid responsive  Check ESR  Steroid trial , pt education on steroids  Will need follow up CT chest ~3-6 months Smoking cessation  No airflow obstruction or restriction on spirometry  Consider repeat PFT going forward  Plan  Patient Instructions  Begin Prednisone 75m daily for 2 week then 124mdaily and hold at this dose  Labs today .  May use ProAir 2 puffs every 4hr as needed for wheezing or shortness of breath .  Follow up Dr. RaChase Callern 6 weeks and As needed   Please contact office for sooner follow up if symptoms do not improve or worsen or seek emergency care

## 2016-04-25 NOTE — Progress Notes (Signed)
Subjective:    Patient ID: Richard Franco, male    DOB: [birth year], 50 y.o.   MRN: [id number]  HPI 50 year old male smoker followed for dyspnea and GGO on CT ? COP (no bx) -previously resolved with steroids . Extensive workup for dyspnea.   TEST  05/06/12 Bronchoscopy with BAL, cell count and TBBx of the lingula  BAL cell count with elevated macrophages (possible COP, Hypersensitivity pneumonitis vs chronic microaspiration, possible Aluminum exposure)  RVP neg Viral culture neg  AFB smear neg, culture negative   Cytology/pathology benign with no evidence of aspiration or ILD  /22/13  Remains on 10 mg steroids;  -methacholine challenge neg -barium swallow WNL  07/02/11 present visit -CT chest with resolution of GGO  - persistent nodules  -steroids decreased to 5 mg   09/16/12  -CT with no GGO, lung nodules stable  2017 Exhaled nitric oxide today in the office i 11 ppb and normal   04/25/2016 Follow up : ER follow up  Seen in the emergency room earlier this week for chest pain and then followed up with cardiology yesterday. Referred to pulmonary today for pleurisy and dyspnea. Says he continues to wear out easily at times Over last few weeks get pleuritic pain in ribs on/off . ER w/up neg for card. Previous cardiac work up neg.  Last CT chest August 2017 showed scattered areas of groundglass attenuation bilaterally, most evident in the right middle and upper lobes. Previous GGO resolved with steroids.  Previous cardiopulmonary stress test in 2014 showed deconditioning. 2D echo in 2013 showed grade 1 diastolic dysfunction with EF of 55-60%. Coronary angiography in 2014 showed normal coronary anatomy Spirometry today is normal with no restriction or airflow obstruction  He denies orthopnea, edema , fever or hemoptysis .  He says he is active , plays golf , hunts and try to be active. Does not exercise on regular basis .   He quit smoking 3 days ago , discussed cessation   Past  Medical History:  Diagnosis Date  . Abnormal chest CT 03/2012   a. 2013: Scattered patchy ground glass opacities and pulmonary nodules. Transbronchial biopsy with no definite etiology of lung finding- possible organizing pneumonia. b. F/u CT 05/2014: stable multiple tiny pulm nodules, no further w/u recommended per notes.   . Anxiety   . Arthritis   . Chronic midline posterior neck pain   . Complication of anesthesia    " difficult to urinate after "  . Depression   . Esophageal stricture   . Fibromyalgia   . Fracture    back  . GERD (gastroesophageal reflux disease)   . Migraine headache    "none lately; I've had alot in my life" (06/21/2015)  . NSTEMI (non-ST elevated myocardial infarction) ([place]) 07/2012   a. Normal cath 03/2012. b. 07/2012: troponin 4, normal cors, ?vasospasm. Did not tolerate Imdur due to headache. On amlodipine.  . Testosterone deficiency   . Tobacco abuse    Current Outpatient Prescriptions on File Prior to Visit  Medication Sig Dispense Refill  . amLODipine (NORVASC) 5 MG tablet Take 1 tablet (5 mg total) by mouth daily. (Patient taking differently: Take 5 mg by mouth at bedtime. ) 30 tablet 11  . aspirin EC 81 MG EC tablet Take 1 tablet (81 mg total) by mouth daily.    Marland Kitchen esomeprazole (NEXIUM) 40 MG capsule Take 1 capsule (40 mg total) by mouth 2 (two) times daily before a meal. 180 capsule 3  . FLUoxetine (  PROZAC) 20 MG capsule Take 20 mg by mouth 2 (two) times daily.     . hyoscyamine (LEVSIN SL) 0.125 MG SL tablet 1-2 tabs under the tongue every 6 hours as needed (Patient taking differently: Take 0.25-0.5 mg by mouth every 6 (six) hours as needed for cramping. ) 90 tablet 3  . ibuprofen (ADVIL,MOTRIN) 200 MG tablet Take 400-800 mg by mouth daily as needed (pain).    Marland Kitchen lamoTRIgine (LAMICTAL) 100 MG tablet Take 100 mg by mouth 2 (two) times daily.    . naproxen (NAPROSYN) 500 MG tablet Take 1 tablet (500 mg total) by mouth 2 (two) times daily. 30 tablet 0  .  nitroGLYCERIN (NITROSTAT) 0.4 MG SL tablet DISSOLVE ONE TABLET UNDER THE TONGUE EVERY 5 MINUTES AS NEEDED FOR CHEST PAIN.  DO NOT EXCEED A TOTAL OF 3 DOSES IN 15 MINUTES 25 tablet 6  . ondansetron (ZOFRAN) 4 MG tablet Take 1 tablet (4 mg total) by mouth as needed for nausea or vomiting (once daily as needed). 30 tablet 3  . Polyvinyl Alcohol-Povidone (REFRESH OP) Place 1 drop into both eyes daily as needed (dry eyes/ irritation).    . risperiDONE (RISPERDAL) 1 MG tablet Take 1 mg by mouth at bedtime.    . nicotine (NICODERM CQ) 21 mg/24hr patch Place 1 patch (21 mg total) onto the skin daily. (Patient not taking: Reported on 04/25/2016) 28 patch 0   Current Facility-Administered Medications on File Prior to Visit  Medication Dose Route Frequency Provider Last Rate Last Dose  . 0.9 %  sodium chloride infusion  500 mL Intravenous Continuous Mauri Pole, MD        Review of Systems. Constitutional:   No  weight loss, night sweats,  Fevers, chills,  +fatigue, or  lassitude.  HEENT:   No headaches,  Difficulty swallowing,  Tooth/dental problems, or  Sore throat,                No sneezing, itching, ear ache, nasal congestion, post nasal drip,   CV:  No chest pain,  Orthopnea, PND, swelling in lower extremities, anasarca, dizziness, palpitations, syncope.   GI  No heartburn, indigestion, abdominal pain, nausea, vomiting, diarrhea, change in bowel habits, loss of appetite, bloody stools.   Resp:   No chest wall deformity  Skin: no rash or lesions.  GU: no dysuria, change in color of urine, no urgency or frequency.  No flank pain, no hematuria   MS:  No joint pain or swelling.  No decreased range of motion.  No back pain.  Psych:  No change in mood or affect. No depression or anxiety.  No memory loss.         Objective:   Physical Exam   Vitals:   04/25/16 1212  BP: 110/70  Pulse: 76  Temp: 97.7 F (36.5 C)  TempSrc: Oral  SpO2: 100%  Weight: 187 lb 3.2 oz (84.9 kg)    Height: 5\' 9"  (1.753 m)    GEN: A/Ox3; pleasant , NAD, well nourished    HEENT:  [address]/AT,  EACs-clear, TMs-wnl, NOSE-clear, THROAT-clear, no lesions, no postnasal drip or exudate noted.   NECK:  Supple w/ fair ROM; no JVD; normal carotid impulses w/o bruits; no thyromegaly or nodules palpated; no lymphadenopathy.    RESP  Clear  P & A; w/o, wheezes/ rales/ or rhonchi. no accessory muscle use, no dullness to percussion  CARD:  RRR, no m/r/g  , no peripheral edema, pulses intact, no cyanosis or  clubbing.  GI:   Soft & nt; nml bowel sounds; no organomegaly or masses detected.   Musco: Warm bil, no deformities or joint swelling noted.   Neuro: alert, no focal deficits noted.    Skin: Warm, no lesions or rashes, tattoos .   CT chest 02/05/16  Minimally displaced spinous process fractures T4-T10. 2. No additional fractures. 3. Diffuse ground-glass attenuation bilaterally likely represents combination of edema or atelectasis. 4. Higher density atelectasis is present dependently bilaterally. 5. Stable multiple small pulmonary nodules. These do not require follow-up. 6. Exophytic cyst posteriorly in the left kidney is stable. No follow-up necessary.   Recent Labs Lab 04/22/16 0556  HGB 13.1  HCT 38.4*  WBC 4.4  PLT 193   .  Recent Labs Lab 04/22/16 0556  NA 139  K 3.6  CL 106  CO2 26  GLUCOSE 110*  BUN 13  CREATININE 1.21  CALCIUM 9.0     Ireoluwa Grant NP-C  [facility]  04/25/2016

## 2016-04-26 ENCOUNTER — Other Ambulatory Visit: Payer: Self-pay | Admitting: Cardiology

## 2016-05-14 ENCOUNTER — Ambulatory Visit: Payer: Medicare Other | Admitting: Gastroenterology

## 2016-05-20 ENCOUNTER — Telehealth: Payer: Self-pay | Admitting: Adult Health

## 2016-05-20 MED ORDER — PREDNISONE 10 MG PO TABS: 10.0000 mg | ORAL_TABLET | Freq: Every day | ORAL | 0 refills | Status: DC

## 2016-05-20 NOTE — Telephone Encounter (Signed)
Spoke with the pt  He states that he is needing refill on pred  He does not have enough to last until 06/11/16 appt  He is down to 10 mg daily  I have refilled his pred 10 mg  Nothing further needed

## 2016-06-06 ENCOUNTER — Telehealth: Payer: Self-pay | Admitting: Adult Health

## 2016-06-06 DIAGNOSIS — R918 Other nonspecific abnormal finding of lung field: Secondary | ICD-10-CM

## 2016-06-06 NOTE — Telephone Encounter (Signed)
LMTCB

## 2016-06-07 NOTE — Telephone Encounter (Signed)
TP  Please advise-  This pt. Called and stated when he saw you on 04/25/16, you and him discuss possibly getting a repeat CT scan for his GGO. He wanted to find out if you wanted him to have this before he sees you. He moved his appointment to 12/22. Below is your instructions to him. Plan  Patient Instructions  Begin Prednisone 20mg  daily for 2 week then 10mg  daily and hold at this dose  Labs today .  May use ProAir 2 puffs every 4hr as needed for wheezing or shortness of breath .  Follow up Dr. Chase Caller in 6 weeks and As needed   Please contact office for sooner follow up if symptoms do not improve or worsen or seek emergency care

## 2016-06-10 NOTE — Telephone Encounter (Signed)
That is fine we can set up for CT chest prior to ov this week if we can set up . -CT chest no contrast for lung nodules.   Please contact office for sooner follow up if symptoms do not improve or worsen or seek emergency care

## 2016-06-10 NOTE — Telephone Encounter (Signed)
lmomtcb x1 

## 2016-06-11 ENCOUNTER — Ambulatory Visit: Payer: Medicare Other | Admitting: Adult Health

## 2016-06-11 NOTE — Telephone Encounter (Signed)
LMTC x 1  

## 2016-06-11 NOTE — Telephone Encounter (Signed)
Spoke with pt, he has an appt with Tammy on Friday and wanted to know if we can get this CT scan done before his appt? Can we try to see if we can fit him in somewhere. Order placed. Thanks.

## 2016-06-12 NOTE — Telephone Encounter (Signed)
appt 06/13/16 for chest ct Richard Franco

## 2016-06-13 ENCOUNTER — Ambulatory Visit (INDEPENDENT_AMBULATORY_CARE_PROVIDER_SITE_OTHER)
Admission: RE | Admit: 2016-06-13 | Discharge: 2016-06-13 | Disposition: A | Discharge status: Home / Self Care | Payer: Medicare Other | Source: Ambulatory Visit | Attending: Adult Health | Admitting: Adult Health

## 2016-06-13 DIAGNOSIS — R918 Other nonspecific abnormal finding of lung field: Secondary | ICD-10-CM | POA: Diagnosis not present

## 2016-06-14 ENCOUNTER — Ambulatory Visit: Payer: Medicare Other | Admitting: Adult Health

## 2016-06-14 NOTE — Progress Notes (Signed)
Spoke with pt and notified of results per TP. Pt verbalized understanding and denied any questions. 

## 2016-06-19 ENCOUNTER — Telehealth: Payer: Self-pay | Admitting: Adult Health

## 2016-06-19 ENCOUNTER — Ambulatory Visit: Payer: Medicare Other | Admitting: Adult Health

## 2016-06-19 MED ORDER — PREDNISONE 10 MG PO TABS: 10.0000 mg | ORAL_TABLET | Freq: Every day | ORAL | 0 refills | Status: DC

## 2016-06-19 NOTE — Telephone Encounter (Signed)
Pt called and stated that he has appt with TP on 1/9 and needed a refill of the prednisone to last him until this appt.  Refill has been sent in and nothing further is needed.

## 2016-06-21 ENCOUNTER — Telehealth: Payer: Self-pay | Admitting: Adult Health

## 2016-06-21 ENCOUNTER — Other Ambulatory Visit: Payer: Self-pay | Admitting: Adult Health

## 2016-06-21 NOTE — Telephone Encounter (Signed)
Spoke with pt, requesting prednisone to be filled in.  I advised that this was called in on 12/27.  Pt states that pharmacy told him that there was no rx for him to fill. Called pharmacy to check status, rx is at pharmacy but cannot be filled yet as pt picked up a 30 day rx on 05/26/16.  Pharmacist states pt should be able to pick up rx tomorrow or Sunday. Called pt to make aware of above recs.  Nothing further needed at this time.

## 2016-07-02 ENCOUNTER — Ambulatory Visit (INDEPENDENT_AMBULATORY_CARE_PROVIDER_SITE_OTHER): Payer: Medicare Other | Admitting: Adult Health

## 2016-07-02 ENCOUNTER — Encounter: Payer: Self-pay | Admitting: Adult Health

## 2016-07-02 DIAGNOSIS — Z720 Tobacco use: Secondary | ICD-10-CM

## 2016-07-02 DIAGNOSIS — R911 Solitary pulmonary nodule: Secondary | ICD-10-CM

## 2016-07-02 DIAGNOSIS — R918 Other nonspecific abnormal finding of lung field: Secondary | ICD-10-CM

## 2016-07-02 MED ORDER — PREDNISONE 10 MG PO TABS: 10.0000 mg | ORAL_TABLET | Freq: Every day | ORAL | 0 refills | Status: DC

## 2016-07-02 NOTE — Addendum Note (Signed)
Addended by: Doroteo Glassman D on: 07/02/2016 11:47 AM   Modules accepted: Orders

## 2016-07-02 NOTE — Assessment & Plan Note (Signed)
Smoking cessation  

## 2016-07-02 NOTE — Patient Instructions (Addendum)
Decrease Prednisone 5mg  .daily for 2 weeks, then every other day for 2 weeks and then stop.  Follow up Dr. Chase Caller in 3 months and As needed   Work on not smoking .

## 2016-07-02 NOTE — Progress Notes (Signed)
@Patient  ID: Richard Franco, male    DOB: 09-Mar-1966, 51 y.o.   MRN: [id number]  Chief Complaint  Patient presents with  . Follow-up    Referring provider: Chipper Herb, MD  HPI: 51 year old male smoker followed for dyspnea and GGO on CT with initial presentation 2013 ? COP /smoking related  -previously resolved with steroids . Extensive workup for dyspnea.  Seen at [facility] by Dr. Randol Kern 2014.   TEST /Significant events  07/02/11 present visit -CT chest with resolution of GGO  - persistent nodules  -steroids decreased to 5 mg  05/06/12 Bronchoscopy with BAL, cell count and TBBx of the lingula  BAL cell count with elevated macrophages (possible COP, Hypersensitivity pneumonitis vs chronic microaspiration, possible Aluminum exposure)  RVP neg Viral culture neg  AFB smear neg, culture negative  Cytology/pathology benign with no evidence of aspiration or ILD  /22/13 Remains on 10 mg steroids;  -methacholine challenge neg -barium swallow WNL Alpha 1 MS 125   09/16/12  -CT with no GGO, lung nodules stable  Previous cardiopulmonary stress test in 2014 showed deconditioning. 2D echo in 2013 showed grade 1 diastolic dysfunction with EF of 55-60%. Coronary angiography in 2014 showed normal coronary anatomy 04/2016 normal with no restriction or airflow obstruction  2017 Exhaled nitric oxide today in the office i 11 ppb and normal   07/02/2016 Follow up: Ground Glass opacities on CT  Pt returns for 2 month follow up . Seen last ov after ER visit with pleurisy in OCT .  CT chest 01/2016 showed GGO bilaterally greatest in RML/RUL .  He was started on steroids with prednisone 20mg  daily for 2 week then 10mg  daily  Says pleurisy sx went away . No cough .  Repeat CT chest 12/21 showed complete resolution of GGO . Scattered lung nodules stable since 2014 c/w benign etiology .  He says he is doing well with no flare of cough or wheezing  Continues to smoke, advised on smoking cessation .      Allergies  Allergen Reactions  . Midodrine Hcl Swelling    Tongue swelling  . Sulfa Antibiotics Rash  . Sulfonamide Derivatives Rash         Immunization History  Administered Date(s) Administered  . Influenza Split 04/16/2012  . Influenza,inj,Quad PF,36+ Mos 03/01/2013, 05/08/2015  . Influenza-Unspecified 04/22/2016  . Pneumococcal Polysaccharide-23 04/16/2012    Past Medical History:  Diagnosis Date  . Abnormal chest CT 03/2012   a. 2013: Scattered patchy ground glass opacities and pulmonary nodules. Transbronchial biopsy with no definite etiology of lung finding- possible organizing pneumonia. b. F/u CT 05/2014: stable multiple tiny pulm nodules, no further w/u recommended per notes.   . Anxiety   . Arthritis   . Chronic midline posterior neck pain   . Complication of anesthesia    " difficult to urinate after "  . Depression   . Esophageal stricture   . Fibromyalgia   . Fracture    back  . GERD (gastroesophageal reflux disease)   . Migraine headache    "none lately; I've had alot in my life" (06/21/2015)  . NSTEMI (non-ST elevated myocardial infarction) ([place]) 07/2012   a. Normal cath 03/2012. b. 07/2012: troponin 4, normal cors, ?vasospasm. Did not tolerate Imdur due to headache. On amlodipine.  . Testosterone deficiency   . Tobacco abuse     Tobacco History: History  Smoking Status  . Current Every Day Smoker  . Packs/day: 1.00  . Years: 27.50  .  Types: Cigarettes  . Last attempt to quit: 04/21/2016  Smokeless Tobacco  . Former Systems developer  . Types: Chew   Ready to quit: No Counseling given: Yes   Outpatient Encounter Prescriptions as of 07/02/2016  Medication Sig  . albuterol (PROVENTIL HFA;VENTOLIN HFA) 108 (90 Base) MCG/ACT inhaler Inhale 2 puffs into the lungs every 6 (six) hours as needed for wheezing or shortness of breath.  Marland Kitchen amLODipine (NORVASC) 5 MG tablet TAKE 1 TABLET BY MOUTH EVERY DAY  . aspirin EC 81 MG EC tablet Take 1 tablet (81 mg total) by  mouth daily.  Marland Kitchen esomeprazole (NEXIUM) 40 MG capsule Take 1 capsule (40 mg total) by mouth 2 (two) times daily before a meal.  . FLUoxetine (PROZAC) 20 MG capsule Take 20 mg by mouth 2 (two) times daily.   . hyoscyamine (LEVSIN SL) 0.125 MG SL tablet 1-2 tabs under the tongue every 6 hours as needed (Patient taking differently: Take 0.25-0.5 mg by mouth every 6 (six) hours as needed for cramping. )  . ibuprofen (ADVIL,MOTRIN) 200 MG tablet Take 400-800 mg by mouth daily as needed (pain).  Marland Kitchen lamoTRIgine (LAMICTAL) 100 MG tablet Take 100 mg by mouth 2 (two) times daily.  . nitroGLYCERIN (NITROSTAT) 0.4 MG SL tablet DISSOLVE ONE TABLET UNDER THE TONGUE EVERY 5 MINUTES AS NEEDED FOR CHEST PAIN.  DO NOT EXCEED A TOTAL OF 3 DOSES IN 15 MINUTES  . ondansetron (ZOFRAN) 4 MG tablet Take 1 tablet (4 mg total) by mouth as needed for nausea or vomiting (once daily as needed).  . Polyvinyl Alcohol-Povidone (REFRESH OP) Place 1 drop into both eyes daily as needed (dry eyes/ irritation).  . predniSONE (DELTASONE) 10 MG tablet Take 1 tablet (10 mg total) by mouth daily with breakfast.  . risperiDONE (RISPERDAL) 2 MG tablet Take 2 mg by mouth at bedtime.  . naproxen (NAPROSYN) 500 MG tablet Take 1 tablet (500 mg total) by mouth 2 (two) times daily. (Patient not taking: Reported on 07/02/2016)  . nicotine (NICODERM CQ) 21 mg/24hr patch Place 1 patch (21 mg total) onto the skin daily. (Patient not taking: Reported on 07/02/2016)  . [DISCONTINUED] risperiDONE (RISPERDAL) 1 MG tablet Take 1 mg by mouth at bedtime.   Facility-Administered Encounter Medications as of 07/02/2016  Medication  . 0.9 %  sodium chloride infusion     Review of Systems  Constitutional:   No  weight loss, night sweats,  Fevers, chills, fatigue, or  lassitude.  HEENT:   No headaches,  Difficulty swallowing,  Tooth/dental problems, or  Sore throat,                No sneezing, itching, ear ache, nasal congestion, post nasal drip,   CV:  No chest  pain,  Orthopnea, PND, swelling in lower extremities, anasarca, dizziness, palpitations, syncope.   GI  No heartburn, indigestion, abdominal pain, nausea, vomiting, diarrhea, change in bowel habits, loss of appetite, bloody stools.   Resp: No shortness of breath with exertion or at rest.  No excess mucus, no productive cough,  No non-productive cough,  No coughing up of blood.  No change in color of mucus.  No wheezing.  No chest wall deformity  Skin: no rash or lesions.  GU: no dysuria, change in color of urine, no urgency or frequency.  No flank pain, no hematuria   MS:  No joint pain or swelling.  No decreased range of motion.  No back pain.    Physical Exam  BP 118/78  Pulse 97   Temp 97.3 F (36.3 C) (Oral)   Ht 5\' 9"  (1.753 m)   Wt 193 lb (87.5 kg)   SpO2 97%   BMI 28.50 kg/m   GEN: A/Ox3; pleasant , NAD, well nourished    HEENT:  [place]/AT,  EACs-clear, TMs-wnl, NOSE-clear, THROAT-clear, no lesions, no postnasal drip or exudate noted.   NECK:  Supple w/ fair ROM; no JVD; normal carotid impulses w/o bruits; no thyromegaly or nodules palpated; no lymphadenopathy.    RESP  Clear  P & A; w/o, wheezes/ rales/ or rhonchi. no accessory muscle use, no dullness to percussion  CARD:  RRR, no m/r/g, no peripheral edema, pulses intact, no cyanosis or clubbing.  GI:   Soft & nt; nml bowel sounds; no organomegaly or masses detected.   Musco: Warm bil, no deformities or joint swelling noted.   Neuro: alert, no focal deficits noted.    Skin: Warm, no lesions or rashes  Psych:  No change in mood or affect. No depression or anxiety.  No memory loss.  Lab Results:  CBC    Component Value Date/Time   WBC 4.4 04/22/2016 0556   RBC 4.35 04/22/2016 0556   HGB 13.1 04/22/2016 0556   HCT 38.4 (L) 04/22/2016 0556   HCT 44.3 09/18/2015 0845   PLT 193 04/22/2016 0556   PLT 235 09/18/2015 0845   MCV 88.3 04/22/2016 0556   MCV 93 09/18/2015 0845   MCH 30.1 04/22/2016 0556   MCHC  34.1 04/22/2016 0556   RDW 13.7 04/22/2016 0556   RDW 14.1 09/18/2015 0845   LYMPHSABS 1.4 04/22/2016 0556   LYMPHSABS 1.3 09/18/2015 0845   MONOABS 0.5 04/22/2016 0556   EOSABS 0.1 04/22/2016 0556   EOSABS 0.2 09/18/2015 0845   BASOSABS 0.0 04/22/2016 0556   BASOSABS 0.0 09/18/2015 0845    BMET    Component Value Date/Time   NA 139 04/22/2016 0556   NA 142 09/18/2015 0845   K 3.6 04/22/2016 0556   CL 106 04/22/2016 0556   CO2 26 04/22/2016 0556   GLUCOSE 110 (H) 04/22/2016 0556   BUN 13 04/22/2016 0556   BUN 11 09/18/2015 0845   CREATININE 1.21 04/22/2016 0556   CREATININE 1.14 03/24/2012 1646   CALCIUM 9.0 04/22/2016 0556   GFRNONAA >60 04/22/2016 0556   GFRAA >60 04/22/2016 0556    BNP No results found for: BNP  ProBNP    Component Value Date/Time   PROBNP 4.0 05/05/2012 1105    Imaging: Ct Chest Wo Contrast  Result Date: 06/13/2016 CLINICAL DATA:  Follow-up pulmonary nodules. EXAM: CT CHEST WITHOUT CONTRAST TECHNIQUE: Multidetector CT imaging of the chest was performed following the standard protocol without IV contrast. COMPARISON:  02/05/2016 chest CT.  04/22/2016 chest radiograph. FINDINGS: Cardiovascular: Normal heart size. No significant pericardial fluid/thickening. Great vessels are normal in course and caliber. Mediastinum/Nodes: No discrete thyroid nodules. Unremarkable esophagus. No pathologically enlarged axillary, mediastinal or gross hilar lymph nodes, noting limited sensitivity for the detection of hilar adenopathy on this noncontrast study. Lungs/Pleura: No pneumothorax. No pleural effusion. The previously described patchy ground-glass opacities in both lungs on the 02/05/2016 chest CT study have resolved. No acute consolidative airspace disease or lung masses. There are at least 8 scattered solid pulmonary nodules throughout both lungs measuring up to 5 mm in the right middle lobe (series 3/ image 102), all unchanged since 06/04/2013 and considered  benign. No new significant pulmonary nodules. Upper abdomen: Partially visualized simple appearing exophytic renal cyst  in the posterior interpolar left kidney measuring at least 2.1 cm. Musculoskeletal: No aggressive appearing focal osseous lesions. Partially visualized surgical hardware from ACDF in the lower cervical spine. IMPRESSION: 1. Complete resolution of the patchy ground-glass opacities in both lungs described on the 02/05/2016 chest CT study . 2. Greater than 3 year stability of scattered subcentimeter pulmonary nodules, which are considered benign. 3. No active disease in the chest. Electronically Signed   By: Ilona Sorrel M.D.   On: 06/13/2016 10:26     Assessment & Plan:   Pulmonary infiltrates GGO on CT chest that appears to be steroid responsive in active smoker ? COP , smoking-related parenchymal lung diseases  Will taper off steroids slowly over next 4 weeks.  Advised on smoking cessation that appears to be a possible trigger.   Plan Patient Instructions  Decrease Prednisone 5mg  .daily for 2 weeks, then every other day for 2 weeks and then stop.  Follow up Dr. Chase Caller in 3 months and As needed   Work on not smoking .             Rexene Edison, NP 07/02/2016

## 2016-07-02 NOTE — Assessment & Plan Note (Signed)
GGO on CT chest that appears to be steroid responsive in active smoker ? COP , smoking-related parenchymal lung diseases  Will taper off steroids slowly over next 4 weeks.  Advised on smoking cessation that appears to be a possible trigger.   Plan Patient Instructions  Decrease Prednisone 5mg  .daily for 2 weeks, then every other day for 2 weeks and then stop.  Follow up Dr. Chase Caller in 3 months and As needed   Work on not smoking .

## 2016-07-02 NOTE — Assessment & Plan Note (Signed)
Stable on CT chest  

## 2016-07-04 ENCOUNTER — Inpatient Hospital Stay (HOSPITAL_BASED_OUTPATIENT_CLINIC_OR_DEPARTMENT_OTHER)
Admission: EM | Admit: 2016-07-04 | Discharge: 2016-07-06 | DRG: 313 | Disposition: A | Discharge status: Home / Self Care | Payer: Medicare Other | Attending: Internal Medicine | Admitting: Internal Medicine

## 2016-07-04 ENCOUNTER — Telehealth: Payer: Self-pay | Admitting: Adult Health

## 2016-07-04 ENCOUNTER — Emergency Department (HOSPITAL_BASED_OUTPATIENT_CLINIC_OR_DEPARTMENT_OTHER): Payer: Medicare Other

## 2016-07-04 ENCOUNTER — Encounter (HOSPITAL_BASED_OUTPATIENT_CLINIC_OR_DEPARTMENT_OTHER): Payer: Self-pay | Admitting: *Deleted

## 2016-07-04 DIAGNOSIS — I10 Essential (primary) hypertension: Secondary | ICD-10-CM | POA: Diagnosis not present

## 2016-07-04 DIAGNOSIS — J449 Chronic obstructive pulmonary disease, unspecified: Secondary | ICD-10-CM | POA: Diagnosis present

## 2016-07-04 DIAGNOSIS — Z87891 Personal history of nicotine dependence: Secondary | ICD-10-CM | POA: Diagnosis not present

## 2016-07-04 DIAGNOSIS — E291 Testicular hypofunction: Secondary | ICD-10-CM | POA: Diagnosis present

## 2016-07-04 DIAGNOSIS — R221 Localized swelling, mass and lump, neck: Secondary | ICD-10-CM | POA: Diagnosis not present

## 2016-07-04 DIAGNOSIS — Z8679 Personal history of other diseases of the circulatory system: Secondary | ICD-10-CM

## 2016-07-04 DIAGNOSIS — Z882 Allergy status to sulfonamides status: Secondary | ICD-10-CM

## 2016-07-04 DIAGNOSIS — F329 Major depressive disorder, single episode, unspecified: Secondary | ICD-10-CM | POA: Diagnosis present

## 2016-07-04 DIAGNOSIS — K219 Gastro-esophageal reflux disease without esophagitis: Secondary | ICD-10-CM | POA: Diagnosis not present

## 2016-07-04 DIAGNOSIS — K222 Esophageal obstruction: Secondary | ICD-10-CM | POA: Diagnosis present

## 2016-07-04 DIAGNOSIS — R0789 Other chest pain: Secondary | ICD-10-CM | POA: Diagnosis not present

## 2016-07-04 DIAGNOSIS — R911 Solitary pulmonary nodule: Secondary | ICD-10-CM | POA: Diagnosis not present

## 2016-07-04 DIAGNOSIS — Z833 Family history of diabetes mellitus: Secondary | ICD-10-CM

## 2016-07-04 DIAGNOSIS — R0602 Shortness of breath: Secondary | ICD-10-CM | POA: Diagnosis present

## 2016-07-04 DIAGNOSIS — F17210 Nicotine dependence, cigarettes, uncomplicated: Secondary | ICD-10-CM | POA: Diagnosis present

## 2016-07-04 DIAGNOSIS — R0609 Other forms of dyspnea: Secondary | ICD-10-CM

## 2016-07-04 DIAGNOSIS — Z7982 Long term (current) use of aspirin: Secondary | ICD-10-CM

## 2016-07-04 DIAGNOSIS — F1490 Cocaine use, unspecified, uncomplicated: Secondary | ICD-10-CM | POA: Diagnosis not present

## 2016-07-04 DIAGNOSIS — J84116 Cryptogenic organizing pneumonia: Secondary | ICD-10-CM | POA: Diagnosis present

## 2016-07-04 DIAGNOSIS — I252 Old myocardial infarction: Secondary | ICD-10-CM

## 2016-07-04 DIAGNOSIS — R0600 Dyspnea, unspecified: Secondary | ICD-10-CM | POA: Diagnosis not present

## 2016-07-04 DIAGNOSIS — N179 Acute kidney failure, unspecified: Secondary | ICD-10-CM | POA: Diagnosis not present

## 2016-07-04 DIAGNOSIS — R079 Chest pain, unspecified: Secondary | ICD-10-CM | POA: Diagnosis present

## 2016-07-04 DIAGNOSIS — Z8249 Family history of ischemic heart disease and other diseases of the circulatory system: Secondary | ICD-10-CM

## 2016-07-04 DIAGNOSIS — F1010 Alcohol abuse, uncomplicated: Secondary | ICD-10-CM | POA: Diagnosis not present

## 2016-07-04 DIAGNOSIS — Z888 Allergy status to other drugs, medicaments and biological substances status: Secondary | ICD-10-CM

## 2016-07-04 DIAGNOSIS — I2510 Atherosclerotic heart disease of native coronary artery without angina pectoris: Secondary | ICD-10-CM | POA: Diagnosis not present

## 2016-07-04 DIAGNOSIS — Z825 Family history of asthma and other chronic lower respiratory diseases: Secondary | ICD-10-CM

## 2016-07-04 DIAGNOSIS — E785 Hyperlipidemia, unspecified: Secondary | ICD-10-CM | POA: Diagnosis present

## 2016-07-04 DIAGNOSIS — F419 Anxiety disorder, unspecified: Secondary | ICD-10-CM | POA: Diagnosis present

## 2016-07-04 DIAGNOSIS — E860 Dehydration: Secondary | ICD-10-CM | POA: Diagnosis present

## 2016-07-04 DIAGNOSIS — Z79899 Other long term (current) drug therapy: Secondary | ICD-10-CM

## 2016-07-04 DIAGNOSIS — M797 Fibromyalgia: Secondary | ICD-10-CM | POA: Diagnosis present

## 2016-07-04 DIAGNOSIS — R000 Tachycardia, unspecified: Secondary | ICD-10-CM | POA: Diagnosis present

## 2016-07-04 DIAGNOSIS — Z7952 Long term (current) use of systemic steroids: Secondary | ICD-10-CM

## 2016-07-04 DIAGNOSIS — Z720 Tobacco use: Secondary | ICD-10-CM | POA: Diagnosis present

## 2016-07-04 LAB — CBC WITH DIFFERENTIAL/PLATELET
Basophils Absolute: 0 10*3/uL (ref 0.0–0.1)
Basophils Relative: 0 %
Eosinophils Absolute: 0 10*3/uL (ref 0.0–0.7)
Eosinophils Relative: 0 %
HCT: 40.1 % (ref 39.0–52.0)
Hemoglobin: 13.8 g/dL (ref 13.0–17.0)
Lymphocytes Relative: 10 %
Lymphs Abs: 0.7 10*3/uL (ref 0.7–4.0)
MCH: 30.8 pg (ref 26.0–34.0)
MCHC: 34.4 g/dL (ref 30.0–36.0)
MCV: 89.5 fL (ref 78.0–100.0)
Monocytes Absolute: 0.4 10*3/uL (ref 0.1–1.0)
Monocytes Relative: 6 %
Neutro Abs: 5.7 10*3/uL (ref 1.7–7.7)
Neutrophils Relative %: 84 %
Platelets: 173 10*3/uL (ref 150–400)
RBC: 4.48 MIL/uL (ref 4.22–5.81)
RDW: 13.1 % (ref 11.5–15.5)
WBC: 6.9 10*3/uL (ref 4.0–10.5)

## 2016-07-04 LAB — COMPREHENSIVE METABOLIC PANEL
ALT: 19 U/L (ref 17–63)
AST: 19 U/L (ref 15–41)
Albumin: 4.1 g/dL (ref 3.5–5.0)
Alkaline Phosphatase: 49 U/L (ref 38–126)
Anion gap: 9 (ref 5–15)
BUN: 14 mg/dL (ref 6–20)
CO2: 22 mmol/L (ref 22–32)
Calcium: 8.9 mg/dL (ref 8.9–10.3)
Chloride: 102 mmol/L (ref 101–111)
Creatinine, Ser: 1.21 mg/dL (ref 0.61–1.24)
GFR calc Af Amer: >60 mL/min (ref >60)
GFR calc non Af Amer: >60 mL/min (ref >60)
Glucose, Bld: 106 mg/dL — ABNORMAL HIGH (ref 65–99)
Potassium: 3.7 mmol/L (ref 3.5–5.1)
Sodium: 133 mmol/L — ABNORMAL LOW (ref 135–145)
Total Bilirubin: 0.4 mg/dL (ref 0.3–1.2)
Total Protein: 6.9 g/dL (ref 6.5–8.1)

## 2016-07-04 LAB — LIPASE, BLOOD: Lipase: 21 U/L (ref 11–51)

## 2016-07-04 LAB — URINALYSIS, ROUTINE W REFLEX MICROSCOPIC
Bilirubin Urine: NEGATIVE
Glucose, UA: NEGATIVE mg/dL
Hgb urine dipstick: NEGATIVE
Ketones, ur: NEGATIVE mg/dL
Leukocytes, UA: NEGATIVE
Nitrite: NEGATIVE
Protein, ur: NEGATIVE mg/dL
Specific Gravity, Urine: 1.002 — ABNORMAL LOW (ref 1.005–1.030)
pH: 5.5 (ref 5.0–8.0)

## 2016-07-04 LAB — BRAIN NATRIURETIC PEPTIDE: B Natriuretic Peptide: 5 pg/mL (ref 0.0–100.0)

## 2016-07-04 LAB — C-REACTIVE PROTEIN: CRP: <0.8 mg/dL (ref <1.0)

## 2016-07-04 LAB — TROPONIN I
Troponin I: <0.03 ng/mL (ref <0.03)
Troponin I: <0.03 ng/mL (ref <0.03)

## 2016-07-04 LAB — D-DIMER, QUANTITATIVE: D-Dimer, Quant: <0.27 ug/mL-FEU (ref 0.00–0.50)

## 2016-07-04 MED ORDER — NICOTINE 21 MG/24HR TD PT24
21.0000 mg | MEDICATED_PATCH | Freq: Every day | TRANSDERMAL | Status: DC
  Administered 2016-07-04 – 2016-07-05 (×2): 21 mg via TRANSDERMAL
  Filled 2016-07-04 (×2): qty 1

## 2016-07-04 MED ORDER — ENOXAPARIN SODIUM 40 MG/0.4ML ~~LOC~~ SOLN
40.0000 mg | SUBCUTANEOUS | Status: DC
  Administered 2016-07-04 – 2016-07-05 (×2): 40 mg via SUBCUTANEOUS
  Filled 2016-07-04 (×2): qty 0.4

## 2016-07-04 MED ORDER — PANTOPRAZOLE SODIUM 40 MG PO TBEC
40.0000 mg | DELAYED_RELEASE_TABLET | Freq: Every day | ORAL | Status: DC
  Administered 2016-07-05 – 2016-07-06 (×2): 40 mg via ORAL
  Filled 2016-07-04 (×2): qty 1

## 2016-07-04 MED ORDER — MORPHINE SULFATE (PF) 2 MG/ML IV SOLN
2.0000 mg | INTRAVENOUS | Status: DC | PRN
  Administered 2016-07-04 – 2016-07-05 (×7): 2 mg via INTRAVENOUS
  Filled 2016-07-04 (×7): qty 1

## 2016-07-04 MED ORDER — ATORVASTATIN CALCIUM 40 MG PO TABS
40.0000 mg | ORAL_TABLET | Freq: Every day | ORAL | Status: DC
  Filled 2016-07-04 (×2): qty 1

## 2016-07-04 MED ORDER — HYPROMELLOSE (GONIOSCOPIC) 2.5 % OP SOLN
1.0000 [drp] | Freq: Two times a day (BID) | OPHTHALMIC | Status: DC
  Administered 2016-07-04 – 2016-07-05 (×2): 1 [drp] via OPHTHALMIC
  Filled 2016-07-04: qty 15

## 2016-07-04 MED ORDER — POLYVINYL ALCOHOL 1.4 % OP SOLN
2.0000 [drp] | OPHTHALMIC | Status: DC | PRN
  Filled 2016-07-04: qty 15

## 2016-07-04 MED ORDER — AMLODIPINE BESYLATE 5 MG PO TABS
5.0000 mg | ORAL_TABLET | Freq: Every day | ORAL | Status: DC
  Administered 2016-07-04 – 2016-07-05 (×2): 5 mg via ORAL
  Filled 2016-07-04 (×2): qty 1

## 2016-07-04 MED ORDER — ALPRAZOLAM 0.25 MG PO TABS
0.2500 mg | ORAL_TABLET | Freq: Two times a day (BID) | ORAL | Status: DC | PRN
  Administered 2016-07-05: 0.25 mg via ORAL
  Filled 2016-07-04: qty 1

## 2016-07-04 MED ORDER — ONDANSETRON HCL 4 MG PO TABS
4.0000 mg | ORAL_TABLET | Freq: Four times a day (QID) | ORAL | Status: DC | PRN
  Administered 2016-07-05: 4 mg via ORAL
  Filled 2016-07-04: qty 1

## 2016-07-04 MED ORDER — IPRATROPIUM BROMIDE 0.02 % IN SOLN
RESPIRATORY_TRACT | Status: AC
  Administered 2016-07-04: 0.5 mg via RESPIRATORY_TRACT
  Filled 2016-07-04: qty 2.5

## 2016-07-04 MED ORDER — IPRATROPIUM BROMIDE 0.02 % IN SOLN
0.5000 mg | Freq: Four times a day (QID) | RESPIRATORY_TRACT | Status: DC
  Administered 2016-07-04 – 2016-07-05 (×2): 0.5 mg via RESPIRATORY_TRACT
  Filled 2016-07-04: qty 2.5

## 2016-07-04 MED ORDER — NITROGLYCERIN 0.2 MG/HR TD PT24
0.2000 mg | MEDICATED_PATCH | Freq: Every day | TRANSDERMAL | Status: DC
  Administered 2016-07-04 – 2016-07-05 (×2): 0.2 mg via TRANSDERMAL
  Filled 2016-07-04 (×2): qty 1

## 2016-07-04 MED ORDER — FLUOXETINE HCL 20 MG PO CAPS
20.0000 mg | ORAL_CAPSULE | Freq: Two times a day (BID) | ORAL | Status: DC
  Administered 2016-07-04 – 2016-07-06 (×4): 20 mg via ORAL
  Filled 2016-07-04 (×4): qty 1

## 2016-07-04 MED ORDER — SODIUM CHLORIDE 0.9 % IV SOLN
INTRAVENOUS | Status: DC
  Administered 2016-07-04: 21:00:00 via INTRAVENOUS
  Administered 2016-07-05: 100 mL/h via INTRAVENOUS
  Administered 2016-07-05: 05:00:00 via INTRAVENOUS

## 2016-07-04 MED ORDER — PREDNISONE 10 MG PO TABS
10.0000 mg | ORAL_TABLET | Freq: Every day | ORAL | Status: DC
  Administered 2016-07-05: 10 mg via ORAL
  Filled 2016-07-04: qty 1

## 2016-07-04 MED ORDER — NITROGLYCERIN 0.4 MG SL SUBL: 0.4000 mg | SUBLINGUAL_TABLET | SUBLINGUAL | Status: DC | PRN

## 2016-07-04 MED ORDER — LAMOTRIGINE 100 MG PO TABS
100.0000 mg | ORAL_TABLET | Freq: Two times a day (BID) | ORAL | Status: DC
  Administered 2016-07-04 – 2016-07-06 (×4): 100 mg via ORAL
  Filled 2016-07-04 (×4): qty 1

## 2016-07-04 MED ORDER — HYOSCYAMINE SULFATE 0.125 MG SL SUBL
0.2500 mg | SUBLINGUAL_TABLET | Freq: Four times a day (QID) | SUBLINGUAL | Status: DC | PRN
  Filled 2016-07-04: qty 4

## 2016-07-04 MED ORDER — CARBOXYMETHYLCELLULOSE SODIUM 1 % OP SOLN: 1.0000 [drp] | Freq: Two times a day (BID) | OPHTHALMIC | Status: DC

## 2016-07-04 MED ORDER — ASPIRIN EC 325 MG PO TBEC
325.0000 mg | DELAYED_RELEASE_TABLET | Freq: Every day | ORAL | Status: DC
  Administered 2016-07-05: 325 mg via ORAL
  Filled 2016-07-04: qty 1

## 2016-07-04 MED ORDER — ACETAMINOPHEN 500 MG PO TABS
1000.0000 mg | ORAL_TABLET | Freq: Once | ORAL | Status: AC
  Administered 2016-07-04: 1000 mg via ORAL
  Filled 2016-07-04: qty 2

## 2016-07-04 MED ORDER — RISPERIDONE 2 MG PO TABS
2.0000 mg | ORAL_TABLET | Freq: Every day | ORAL | Status: DC
  Administered 2016-07-04 – 2016-07-05 (×2): 2 mg via ORAL
  Filled 2016-07-04 (×2): qty 1

## 2016-07-04 MED ORDER — ZOLPIDEM TARTRATE 5 MG PO TABS: 5.0000 mg | ORAL_TABLET | Freq: Every evening | ORAL | Status: DC | PRN

## 2016-07-04 MED ORDER — ACETAMINOPHEN 325 MG PO TABS
650.0000 mg | ORAL_TABLET | ORAL | Status: DC | PRN
  Administered 2016-07-05 (×4): 650 mg via ORAL
  Filled 2016-07-04 (×4): qty 2

## 2016-07-04 NOTE — ED Notes (Signed)
Pt. Had a MI 3 years ago was sleeping when the MI started.

## 2016-07-04 NOTE — ED Triage Notes (Addendum)
Sob, tightness in his jaw and fatigue x 4 days. States he has been coming off Prednisone for the past 2 days. He is ambulatory. Speaking in complete sentences. Wife states he gets a fever every time he eats.

## 2016-07-04 NOTE — Progress Notes (Signed)
    Patient coming from [facility] for DOE and inappropriate tachycardia response to minimal exertion. Of note patient with a significant cardiac history including NSTEMI in 2014 from vasospasm w/ Troponin of 4 at that time. Nml cath at that time. Pts current sx concerning for recurrent NSTEMI thought trop nml and EKG nml. Pt would benefit from either NM or treadmill stress. Discussed w/ Cards and will defer that decision until pt arrives. Needs to be on tele and cycle trop overnight. Call cards when pt arrives.   Linna Darner, MD [facility] 07/04/2016, 4:42 PM

## 2016-07-04 NOTE — Telephone Encounter (Signed)
Spoke with pt, aware of results/recs.  Nothing further needed.  

## 2016-07-04 NOTE — Telephone Encounter (Signed)
Will need to see what is found in ER before deciding  Please contact office for sooner follow up if symptoms do not improve or worsen or seek emergency care

## 2016-07-04 NOTE — H&P (Signed)
History and Physical    Richard Franco:[id number] DOB: 08/05/1965 DOA: 07/04/2016  Referring MD/NP/PA:   PCP: Redge Gainer, MD   Patient coming from:  The patient is coming from home.  At baseline, pt is independent for most of ADL.   Chief Complaint: SOB and chest pain  HPI: Richard Franco is a 51 y.o. male with medical history significant of CAD, NSTEMI due to vasospasm (normal LHC 2014), fibromyalgia, testosterone deficiency, esophageal stricture, GERD, hypertension, depression, pleurisy, lung Nodule, and chronic shortness of breath (due to GGO and COP, and smoking per Pulm), who presents with CP and worsening SOB.  Pt has chronic SOB and has been followed by pulmonology. He was seen by PA, Tammy Parret on 1/91/18. Per clinic note, pt has hx of GGO which has resolved on CT-scan 09/16/12. He has COP and persistent lung nodule which is stable. Pt has been treated with prednisone 10 mg daily with good response. The dose of prednisone was decreased to 5 mg daily 2 days ago. Pt states that his SOB has worsened in the past several days. It is aggravated by exertion. Patient does not have for cough, fever, chills or flulike symptoms.   Pt also reports CP. It is located in the frontal chest, sharp, intermittently, 4-7 out of 10 in severity, radiating to both shoulders. It is pleuritic and is aggravated by deep breath. No calf tenderness. Per EDP, pt has tachycardia with heart rate up to 140 in ED. He has nausea, no vomiting, abdominal pain. He had 2 bowel movements with loose stool today. Currently no active diarrhea. Denies symptoms of UTI or unilateral weakness. Of note, he is also followed by [facility] and NSTEMI thought to be due to vasospasm. LHC Feb 2014 showed normal coronary arteries and LV function. Pt continues to smoke.  ED Course: pt was found to have  negative d-dimer, negative troponin, BNP 5.0, lipase 29, negative urinalysis,  Mild AKi with Cre 1.21, negative chest x-ray,  temperature normal, tachycardia, O2 sats 100% on room air. Pt is placed on tele bed for obs.  Review of Systems:   General: no fevers, chills, no changes in body weight, has poor appetite, has fatigue HEENT: no blurry vision, hearing changes or sore throat Respiratory: has dyspnea, no coughing, wheezing CV: has chest pain, no palpitations GI: has nausea, no vomiting, abdominal pain, has loose stool, no constipation GU: no dysuria, burning on urination, increased urinary frequency, hematuria  Ext: no leg edema Neuro: no unilateral weakness, numbness, or tingling, no vision change or hearing loss Skin: no rash, no skin tear. MSK: No muscle spasm, no deformity, no limitation of range of movement in spin Heme: No easy bruising.  Travel history: No recent long distant travel.  Allergy:  Allergies  Allergen Reactions  . Midodrine Hcl Swelling    Tongue swelling  . Sulfa Antibiotics Rash  . Sulfonamide Derivatives Rash         Past Medical History:  Diagnosis Date  . Abnormal chest CT 03/2012   a. 2013: Scattered patchy ground glass opacities and pulmonary nodules. Transbronchial biopsy with no definite etiology of lung finding- possible organizing pneumonia. b. F/u CT 05/2014: stable multiple tiny pulm nodules, no further w/u recommended per notes.   . Anxiety   . Arthritis   . Chronic midline posterior neck pain   . Complication of anesthesia    " difficult to urinate after "  . Depression   . Esophageal stricture   .  Fibromyalgia   . Fracture    back  . GERD (gastroesophageal reflux disease)   . Migraine headache    "none lately; I've had alot in my life" (06/21/2015)  . NSTEMI (non-ST elevated myocardial infarction) ([place]) 07/2012   a. Normal cath 03/2012. b. 07/2012: troponin 4, normal cors, ?vasospasm. Did not tolerate Imdur due to headache. On amlodipine.  . Testosterone deficiency   . Tobacco abuse     Past Surgical History:  Procedure Laterality Date  . ANKLE FUSION  Left 2006  . BACK SURGERY    . CERVICAL FUSION  X7 "last one 04/2011"  . COLONOSCOPY    . CYSTECTOMY    . FRACTURE SURGERY    . LEFT HEART CATHETERIZATION WITH CORONARY ANGIOGRAM N/A 03/25/2012   Procedure: LEFT HEART CATHETERIZATION WITH CORONARY ANGIOGRAM;  Surgeon: Burnell Blanks, MD;  Location: [facility] CATH LAB;  Service: Cardiovascular;  Laterality: N/A;  . LEFT HEART CATHETERIZATION WITH CORONARY ANGIOGRAM N/A 08/11/2012   Procedure: LEFT HEART CATHETERIZATION WITH CORONARY ANGIOGRAM;  Surgeon: Peter M Martinique, MD;  Location: [facility] CATH LAB;  Service: Cardiovascular;  Laterality: N/A;  . POSTERIOR CERVICAL FUSION/FORAMINOTOMY  05/09/2011   Procedure: POSTERIOR CERVICAL FUSION/FORAMINOTOMY LEVEL 1;  Surgeon: Hosie Spangle;  Location: [place] [facility] OR[facility];  Service: Neurosurgery;  Laterality: N/A;  C4/5 posterior arthrodesis with instrumentation   . UPPER GASTROINTESTINAL ENDOSCOPY    . VIDEO BRONCHOSCOPY  05/06/2012   Procedure: VIDEO BRONCHOSCOPY WITH FLUORO;  Surgeon: Jyl Heinz, MD;  Location: [facility] ENDOSCOPY;  Service: Cardiopulmonary;  Laterality: Bilateral;    Social History:  reports that he has been smoking Cigarettes.  He has a 27.50 pack-year smoking history. He has quit using smokeless tobacco. His smokeless tobacco use included Chew. He reports that he does not drink alcohol or use drugs.  Family History:  Family History  Problem Relation Age of Onset  . Diabetes Mother   . Coronary artery disease Paternal Uncle   . Asthma Sister   . Colon cancer Neg Hx   . Colon polyps Neg Hx   . Rectal cancer Neg Hx   . Stomach cancer Neg Hx      Prior to Admission medications   Medication Sig Start Date End Date Taking? Authorizing Provider  albuterol (PROVENTIL HFA;VENTOLIN HFA) 108 (90 Base) MCG/ACT inhaler Inhale 2 puffs into the lungs every 6 (six) hours as needed for wheezing or shortness of breath. 04/25/16   Tammy S Parrett, NP  amLODipine (NORVASC) 5 MG tablet TAKE 1 TABLET BY  MOUTH EVERY DAY 04/26/16   Burtis Junes, NP  aspirin EC 81 MG EC tablet Take 1 tablet (81 mg total) by mouth daily. 08/12/12   Dayna N Dunn, PA-C  esomeprazole (NEXIUM) 40 MG capsule Take 1 capsule (40 mg total) by mouth 2 (two) times daily before a meal. 03/22/16   Mauri Pole, MD  FLUoxetine (PROZAC) 20 MG capsule Take 20 mg by mouth 2 (two) times daily.     Historical Provider, MD  hyoscyamine (LEVSIN SL) 0.125 MG SL tablet 1-2 tabs under the tongue every 6 hours as needed Patient taking differently: Take 0.25-0.5 mg by mouth every 6 (six) hours as needed for cramping.  03/11/16   Mauri Pole, MD  ibuprofen (ADVIL,MOTRIN) 200 MG tablet Take 400-800 mg by mouth daily as needed (pain).    Historical Provider, MD  lamoTRIgine (LAMICTAL) 100 MG tablet Take 100 mg by mouth 2 (two) times daily.    Historical Provider,  MD  naproxen (NAPROSYN) 500 MG tablet Take 1 tablet (500 mg total) by mouth 2 (two) times daily. Patient not taking: Reported on 07/02/2016 04/22/16   Nona Dell, PA-C  nicotine (NICODERM CQ) 21 mg/24hr patch Place 1 patch (21 mg total) onto the skin daily. Patient not taking: Reported on 07/02/2016 04/24/16   Burtis Junes, NP  nitroGLYCERIN (NITROSTAT) 0.4 MG SL tablet DISSOLVE ONE TABLET UNDER THE TONGUE EVERY 5 MINUTES AS NEEDED FOR CHEST PAIN.  DO NOT EXCEED A TOTAL OF 3 DOSES IN 15 MINUTES 10/09/15   Larey Dresser, MD  ondansetron (ZOFRAN) 4 MG tablet Take 1 tablet (4 mg total) by mouth as needed for nausea or vomiting (once daily as needed). 03/11/16   Mauri Pole, MD  Polyvinyl Alcohol-Povidone (REFRESH OP) Place 1 drop into both eyes daily as needed (dry eyes/ irritation).    Historical Provider, MD  predniSONE (DELTASONE) 10 MG tablet Take 1 tablet (10 mg total) by mouth daily with breakfast. 07/02/16   Tammy S Parrett, NP  risperiDONE (RISPERDAL) 2 MG tablet Take 2 mg by mouth at bedtime.    Historical Provider, MD    Physical Exam: Vitals:    07/04/16 1359 07/04/16 1600 07/04/16 1702 07/04/16 1948  BP: 136/82 127/85 146/64 132/82  Pulse: 102 83 85 78  Resp: _0 Temp: 97.6 F (36.4 C)   98 F (36.7 C)  TempSrc: Oral   Oral  SpO2: 100% 100% 100% 98%  Weight:    84.9 kg (187 lb 1.6 oz)  Height:    _1  (1.753 m)   General: Not in acute distress HEENT:       Eyes: PERRL, EOMI, no scleral icterus.       ENT: No discharge from the ears and nose, no pharynx injection, no tonsillar enlargement.        Neck: No JVD, no bruit, no mass felt. Heme: No neck lymph node enlargement. Cardiac: S1/S2, RRR, No murmurs, No gallops or rubs. Respiratory: No rales, wheezing, rhonchi or rubs. GI: Soft, nondistended, nontender, no rebound pain, no organomegaly, BS present. GU: No hematuria Ext: No pitting leg edema bilaterally. 2+DP/PT pulse bilaterally. Musculoskeletal: No joint deformities, No joint redness or warmth, no limitation of ROM in spin. Skin: No rashes.  Neuro: Alert, oriented X3, cranial nerves II-XII grossly intact, moves all extremities normally. Psych: Patient is not psychotic, no suicidal or hemocidal ideation.  Labs on Admission: I have personally reviewed following labs and imaging studies  CBC:  Recent Labs Lab 07/04/16 1423  WBC 6.9  NEUTROABS 5.7  HGB 13.8  HCT 40.1  MCV 89.5  PLT 262   Basic Metabolic Panel:  Recent Labs Lab 07/04/16 1423  NA 133*  K 3.7  CL 102  CO2 22  GLUCOSE 106*  BUN 14  CREATININE 1.21  CALCIUM 8.9   GFR: Estimated Creatinine Clearance: 79.8 mL/min (by C-G formula based on SCr of 1.21 mg/dL). Liver Function Tests:  Recent Labs Lab 07/04/16 1423  AST 19  ALT 19  ALKPHOS 49  BILITOT 0.4  PROT 6.9  ALBUMIN 4.1    Recent Labs Lab 07/04/16 1423  LIPASE 21   No results for input(s): AMMONIA in the last 168 hours. Coagulation Profile: No results for input(s): INR, PROTIME in the last 168 hours. Cardiac Enzymes:  Recent Labs Lab 07/04/16 1423    TROPONINI <0.03   BNP (last 3 results) No results for input(s): PROBNP in the  last 8760 hours. HbA1C: No results for input(s): HGBA1C in the last 72 hours. CBG: No results for input(s): GLUCAP in the last 168 hours. Lipid Profile: No results for input(s): CHOL, HDL, LDLCALC, TRIG, CHOLHDL, LDLDIRECT in the last 72 hours. Thyroid Function Tests: No results for input(s): TSH, T4TOTAL, FREET4, T3FREE, THYROIDAB in the last 72 hours. Anemia Panel: No results for input(s): VITAMINB12, FOLATE, FERRITIN, TIBC, IRON, RETICCTPCT in the last 72 hours. Urine analysis:    Component Value Date/Time   COLORURINE YELLOW 07/04/2016 1423   APPEARANCEUR CLEAR 07/04/2016 1423   LABSPEC 1.002 (L) 07/04/2016 1423   PHURINE 5.5 07/04/2016 1423   GLUCOSEU NEGATIVE 07/04/2016 1423   HGBUR NEGATIVE 07/04/2016 [place] 07/04/2016 1423   BILIRUBINUR neg 06/14/2014 1056   KETONESUR NEGATIVE 07/04/2016 1423   PROTEINUR NEGATIVE 07/04/2016 1423   UROBILINOGEN negative 06/14/2014 1056   UROBILINOGEN 0.2 08/10/2012 1537   NITRITE NEGATIVE 07/04/2016 1423   LEUKOCYTESUR NEGATIVE 07/04/2016 1423   Sepsis Labs: _0 (procalcitonin:4,lacticidven:4) )No results found for this or any previous visit (from the past 240 hour(s)).   Radiological Exams on Admission: Dg Chest 2 View  Result Date: 07/04/2016 CLINICAL DATA:  3-4 days of shortness of breath, postprandial nausea, diaphoresis. Current smoker. EXAM: CHEST  2 VIEW COMPARISON:  CT scan of the chest of June 13, 2016 and chest x-ray of April 22, 2016. FINDINGS: The lungs are adequately inflated. There is no focal infiltrate. There is no pleural effusion. The heart and pulmonary vascularity are normal. The mediastinum is normal in width. The bony thorax is unremarkable. IMPRESSION: There is no active cardiopulmonary disease. Electronically Signed   By: David  Martinique M.D.   On: 07/04/2016 14:42     EKG: Independently reviewed.   QTC 441, RAD, early R-wave progression, no ischemic changes.   Assessment/Plan Principal Problem:   Chest pain, atypical Active Problems:   DYSPNEA   History of tobacco abuse   Pulmonary nodule   GERD (gastroesophageal reflux disease)   Hyperlipidemia   Tobacco abuse   History of coronary vasospasm   Essential hypertension   AKI (acute kidney injury) ([place])   Chest pain, atypical: Etiology is not clear. No infiltration on chest x-ray. D-dimer negative, less likely to have for PE. Differential diagnoses include ACS given hx of vasospasm NSTEME and pleurisy given pleuritic nature of chest pain. Initial troponin negative. EKG has no ischemic change. - will place on Tele bed for obs - cycle CE q6 x3 and repeat her EKG in the am  - start NGT patch - prn SL Nitroglycerin, Morphine - increased ASA dose from 81 to 325 mg daily - start lipitor 40 mg daily - 2d echo - check ESR and CRP - please call Card in AM  DYSPNEA: Etiology is not clear. BNP negative, no leg edema, unlikely due to CHF. Pt has chronic SOB due to COP/GGO/smoking. He seems to have had good response to steroid treatment per pulmonology. His symptoms has worsened since tapered down prednisone dose. CXR is negative. No Flu symptoms. -will increased prednisone dose to 10 mg daily -Atrovent Nebs.  Tobacco abuse: -Did counseling about importance of quitting smoking -Nicotine patch  GERD: -Protonix  HLD: Last LDL was 109 on 04/24/16. Not taking meds now -start lipitor as above due to CP -Check FLP  Depression and anxiety: Stable, no suicidal or homicidal ideations. -Continue home medications: prn Xanax and prozac  HTN: -continue amlodipine  Mild AKI: Cre 1.21. Likely due to prerenal secondary  to dehydration and continuation of NSAIDs - IVF: 75 cc/h of NS - Follow up renal function by BMP - Hold ibuprofen and naproxen    DVT ppx: SQ Lovenox Code Status: Full code Family Communication: Yes, patient's  girlfriend at bed side Disposition Plan:  Anticipate discharge back to previous home environment Consults called:  none Admission status: Obs / tele     Date of Service 07/04/2016    Ivor Costa [facility] Pager [phone]  If 7PM-7AM, please contact night-coverage [email or web address] Password [facility] 07/04/2016, 8:49 PM

## 2016-07-04 NOTE — ED Notes (Signed)
ED Provider at bedside. 

## 2016-07-04 NOTE — ED Notes (Addendum)
Pt. Reports he has a feeling something just isn't right with his heart.  Pt. Was up to walk and his HR jumped up.  Pt. In no distress while in stretcher and no shob noted.  Pt. HR up to 140 then back down to 91 and SR on monitor.

## 2016-07-04 NOTE — ED Notes (Signed)
Spoke with [place] who will be taking Pt. In room 16.  Explained Pt. Status and Pt. In SR with C/O pain being a Pleurisy type pain.  Pt. Rates a 4/10 and states off and on.  Pt. States he knows difference in chest pain and pleurisy pain.  Pt. In no distress and has no shob noted.

## 2016-07-04 NOTE — ED Notes (Signed)
Pt. Reports feels like pleurisy

## 2016-07-04 NOTE — ED Notes (Signed)
Ambulate pt on pulse Ox. Pt maintained 97% plus during ambulation, however Pt heart rate Increased to 130-140 range.

## 2016-07-04 NOTE — Telephone Encounter (Signed)
Pt had OV with TP on 07-03-15 and was instructed to decrease Prednisone by 5mg  daily for 2 weeks, then every other day for 2 weeks and then stop. Pt currently on 5mg  daily. Pt states he is having increased sob, pt is wanting to know if tapering the prednisone will cause this.Pt denies any other symptoms. Pt is currently at  ED for SOB, but requested that we get this message to TP for commendations.  TP please advise. Thanks.

## 2016-07-04 NOTE — ED Provider Notes (Signed)
[place] DEPT [facility] Provider Note   CSN: [id number] Arrival date & time: 07/04/16  1354     History   Chief Complaint Chief Complaint  Patient presents with  . Shortness of Breath    HPI Richard Franco is a 51 y.o. male.  HPI Richard Franco is a 51 y.o. male with PMH significant for NSTEMI who presents with 4 day history of fatigue and shortness of breath with activity.  He is followed by [facility] and saw them 07/03/15 and decreased his prednisone to 5 mg daily. He is also followed by [facility] and NSTEMI thought to be due to vasospasm.  LHC Feb 2014 showed normal coronary arteries and LV function. He currently smokes "here and there". He states over the last 4 days he has had an increase in shortness of breath with activity and jaw tightness intermittently.  No chest pain, pleuritic pain, cough, or fever.  He also states he feels as though he GERD is acting up because when he eats he becomes diaphoretic and nauseated with some abdominal discomfort.  No medications pta.  No hx of DVT/PE, unilateral leg swelling, recent immobilization, or surgery.  Past Medical History:  Diagnosis Date  . Abnormal chest CT 03/2012   a. 2013: Scattered patchy ground glass opacities and pulmonary nodules. Transbronchial biopsy with no definite etiology of lung finding- possible organizing pneumonia. b. F/u CT 05/2014: stable multiple tiny pulm nodules, no further w/u recommended per notes.   . Anxiety   . Arthritis   . Chronic midline posterior neck pain   . Complication of anesthesia    " difficult to urinate after "  . Depression   . Esophageal stricture   . Fibromyalgia   . Fracture    back  . GERD (gastroesophageal reflux disease)   . Migraine headache    "none lately; I've had alot in my life" (06/21/2015)  . NSTEMI (non-ST elevated myocardial infarction) ([place]) 07/2012   a. Normal cath 03/2012. b. 07/2012: troponin 4, normal cors, ?vasospasm. Did not tolerate Imdur due to  headache. On amlodipine.  . Testosterone deficiency   . Tobacco abuse     Patient Active Problem List   Diagnosis Date Noted  . Chest pain, atypical 11/16/2015  . Dyspnea 11/16/2015  . Pleuritic chest pain 06/22/2015  . Tobacco abuse 06/22/2015  . History of coronary vasospasm 06/22/2015  . Smoking history 07/19/2014  . Chronic fatigue 07/19/2014  . Chronic arthralgias of knees and hips 11/26/2012  . Hyperlipidemia 09/25/2012  . GERD (gastroesophageal reflux disease) 08/11/2012  . BPH (benign prostatic hyperplasia) 08/11/2012  . Abnormal immunological finding in serum 05/03/2012  . Pulmonary infiltrates 04/16/2012  . Pulmonary nodule 03/25/2012  . Abnormal CT lung screening 03/25/2012  . History of tobacco abuse 02/18/2012  . Fibromyalgia 02/18/2012  . Hx of migraine headaches 02/18/2012  . DYSPNEA 08/24/2009  . CHEST PAIN-UNSPECIFIED 08/24/2009  . ABFND, FALSE POSITIVE SEROLOGIC TEST, [facility] 01/12/2007  . OSTEOARTHRITIS 01/05/2007    Past Surgical History:  Procedure Laterality Date  . ANKLE FUSION Left 2006  . BACK SURGERY    . CERVICAL FUSION  X7 "last one 04/2011"  . COLONOSCOPY    . CYSTECTOMY    . FRACTURE SURGERY    . LEFT HEART CATHETERIZATION WITH CORONARY ANGIOGRAM N/A 03/25/2012   Procedure: LEFT HEART CATHETERIZATION WITH CORONARY ANGIOGRAM;  Surgeon: Burnell Blanks, MD;  Location: [facility] CATH LAB;  Service: Cardiovascular;  Laterality: N/A;  . LEFT HEART CATHETERIZATION WITH  CORONARY ANGIOGRAM N/A 08/11/2012   Procedure: LEFT HEART CATHETERIZATION WITH CORONARY ANGIOGRAM;  Surgeon: Peter M Martinique, MD;  Location: [facility] CATH LAB;  Service: Cardiovascular;  Laterality: N/A;  . POSTERIOR CERVICAL FUSION/FORAMINOTOMY  05/09/2011   Procedure: POSTERIOR CERVICAL FUSION/FORAMINOTOMY LEVEL 1;  Surgeon: Hosie Spangle;  Location: [place] NEURO ORS;  Service: Neurosurgery;  Laterality: N/A;  C4/5 posterior arthrodesis with instrumentation   . UPPER GASTROINTESTINAL  ENDOSCOPY    . VIDEO BRONCHOSCOPY  05/06/2012   Procedure: VIDEO BRONCHOSCOPY WITH FLUORO;  Surgeon: Jyl Heinz, MD;  Location: [facility] ENDOSCOPY;  Service: Cardiopulmonary;  Laterality: Bilateral;       Home Medications    Prior to Admission medications   Medication Sig Start Date End Date Taking? Authorizing Provider  albuterol (PROVENTIL HFA;VENTOLIN HFA) 108 (90 Base) MCG/ACT inhaler Inhale 2 puffs into the lungs every 6 (six) hours as needed for wheezing or shortness of breath. 04/25/16   Tammy S Parrett, NP  amLODipine (NORVASC) 5 MG tablet TAKE 1 TABLET BY MOUTH EVERY DAY 04/26/16   Burtis Junes, NP  aspirin EC 81 MG EC tablet Take 1 tablet (81 mg total) by mouth daily. 08/12/12   Dayna N Dunn, PA-C  esomeprazole (NEXIUM) 40 MG capsule Take 1 capsule (40 mg total) by mouth 2 (two) times daily before a meal. 03/22/16   Mauri Pole, MD  FLUoxetine (PROZAC) 20 MG capsule Take 20 mg by mouth 2 (two) times daily.     Historical Provider, MD  hyoscyamine (LEVSIN SL) 0.125 MG SL tablet 1-2 tabs under the tongue every 6 hours as needed Patient taking differently: Take 0.25-0.5 mg by mouth every 6 (six) hours as needed for cramping.  03/11/16   Mauri Pole, MD  ibuprofen (ADVIL,MOTRIN) 200 MG tablet Take 400-800 mg by mouth daily as needed (pain).    Historical Provider, MD  lamoTRIgine (LAMICTAL) 100 MG tablet Take 100 mg by mouth 2 (two) times daily.    Historical Provider, MD  naproxen (NAPROSYN) 500 MG tablet Take 1 tablet (500 mg total) by mouth 2 (two) times daily. Patient not taking: Reported on 07/02/2016 04/22/16   Nona Dell, PA-C  nicotine (NICODERM CQ) 21 mg/24hr patch Place 1 patch (21 mg total) onto the skin daily. Patient not taking: Reported on 07/02/2016 04/24/16   Burtis Junes, NP  nitroGLYCERIN (NITROSTAT) 0.4 MG SL tablet DISSOLVE ONE TABLET UNDER THE TONGUE EVERY 5 MINUTES AS NEEDED FOR CHEST PAIN.  DO NOT EXCEED A TOTAL OF 3 DOSES IN 15 MINUTES 10/09/15    Larey Dresser, MD  ondansetron (ZOFRAN) 4 MG tablet Take 1 tablet (4 mg total) by mouth as needed for nausea or vomiting (once daily as needed). 03/11/16   Mauri Pole, MD  Polyvinyl Alcohol-Povidone (REFRESH OP) Place 1 drop into both eyes daily as needed (dry eyes/ irritation).    Historical Provider, MD  predniSONE (DELTASONE) 10 MG tablet Take 1 tablet (10 mg total) by mouth daily with breakfast. 07/02/16   Tammy S Parrett, NP  risperiDONE (RISPERDAL) 2 MG tablet Take 2 mg by mouth at bedtime.    Historical Provider, MD    Family History Family History  Problem Relation Age of Onset  . Diabetes Mother   . Coronary artery disease Paternal Uncle   . Asthma Sister   . Colon cancer Neg Hx   . Colon polyps Neg Hx   . Rectal cancer Neg Hx   . Stomach cancer Neg Hx  Social History Social History  Substance Use Topics  . Smoking status: Current Every Day Smoker    Packs/day: 1.00    Years: 27.50    Types: Cigarettes    Last attempt to quit: 04/21/2016  . Smokeless tobacco: Former Systems developer    Types: Chew  . Alcohol use No     Comment: 06/21/2015 "3 beers/day lately; more lately cause of being separated"      Allergies   Midodrine hcl; Sulfa antibiotics; and Sulfonamide derivatives   Review of Systems Review of Systems All other systems negative unless otherwise stated in HPI   Physical Exam Updated Vital Signs BP 136/82 (BP Location: Left Arm)   Pulse 102   Temp 97.6 F (36.4 C) (Oral)   Resp 20   Ht 5\' 9"  (1.753 m)   Wt 87.5 kg   SpO2 100%   BMI 28.50 kg/m   Physical Exam  Constitutional: He is oriented to person, place, and time. He appears well-developed and well-nourished.  Non-toxic appearance. He does not have a sickly appearance. He does not appear ill.  HENT:  Head: Normocephalic and atraumatic.  Mouth/Throat: Oropharynx is clear and moist.  Eyes: Conjunctivae are normal. Pupils are equal, round, and reactive to light.  Neck: Normal range of  motion. Neck supple.  Cardiovascular: Normal rate and regular rhythm.   Pulmonary/Chest: Effort normal and breath sounds normal. No accessory muscle usage or stridor. No respiratory distress. He has no wheezes. He has no rhonchi. He has no rales.  100% oxygen saturation on RA.  Able to speak in full sentences without difficulty.  Abdominal: Soft. Bowel sounds are normal. He exhibits no distension. There is no tenderness. There is no rebound and no guarding.  Musculoskeletal: Normal range of motion.  Lymphadenopathy:    He has no cervical adenopathy.  Neurological: He is alert and oriented to person, place, and time.  Skin: Skin is warm and dry.  Psychiatric: He has a normal mood and affect. His behavior is normal.     ED Treatments / Results  Labs (all labs ordered are listed, but only abnormal results are displayed) Labs Reviewed  COMPREHENSIVE METABOLIC PANEL - Abnormal; Notable for the following:       Result Value   Sodium 133 (*)    Glucose, Bld 106 (*)    All other components within normal limits  URINALYSIS, ROUTINE W REFLEX MICROSCOPIC - Abnormal; Notable for the following:    Specific Gravity, Urine 1.002 (*)    All other components within normal limits  TROPONIN I  CBC WITH DIFFERENTIAL/PLATELET  LIPASE, BLOOD  BRAIN NATRIURETIC PEPTIDE  D-DIMER, QUANTITATIVE (NOT AT [facility])    EKG  EKG Interpretation  Date/Time:  Thursday July 04 2016 14:06:36 EST Ventricular Rate:  94 PR Interval:    QRS Duration: 92 QT Interval:  352 QTC Calculation: 441 R Axis:   87 Text Interpretation:  Sinus rhythm No significant change since last tracing Confirmed by LITTLE MD, RACHEL [phone]) on 07/04/2016 2:34:28 PM       Radiology Dg Chest 2 View  Result Date: 07/04/2016 CLINICAL DATA:  3-4 days of shortness of breath, postprandial nausea, diaphoresis. Current smoker. EXAM: CHEST  2 VIEW COMPARISON:  CT scan of the chest of June 13, 2016 and chest x-ray of April 22, 2016.  FINDINGS: The lungs are adequately inflated. There is no focal infiltrate. There is no pleural effusion. The heart and pulmonary vascularity are normal. The mediastinum is normal in width. The bony  thorax is unremarkable. IMPRESSION: There is no active cardiopulmonary disease. Electronically Signed   By: David  Martinique M.D.   On: 07/04/2016 14:42    Procedures Procedures (including critical care time)  Medications Ordered in ED Medications - No data to display   Initial Impression / Assessment and Plan / ED Course  I have reviewed the triage vital signs and the nursing notes.  Pertinent labs & imaging results that were available during my care of the patient were reviewed by me and considered in my medical decision making (see chart for details).  Clinical Course    Patient followed by [facility] and [place] [facility] presents with 4 days of dyspnea with activity.  Recently saw pulmonology, 2 days ago, [place] improving and discharged with recommendation to decrease prednisone to 5 mg daily and eventually taper off. [place] 2014 showed normal coronaries, NSTEMI thought to be related to vasospasm.  Fatigue thought to be related to deconditioning as evidenced by CP exercise testing Sept 2014. Today, no CP, but reports nightly palpitations.  Only SOB.  Vitals stable, 100% on RA.  No dyspnea with conversation.  Lungs CTAB. EKG NSR.  Labs including troponin, dimer, and BNP without acute abnormalities.  Able to ambulate without hypoxia and maintaining oxygen saturations, however his heart rate increased to the 130-140s.  Spoke with cardiology, Dr. Johnsie Cancel, who feels this less likely to be ACS. However, I believe patient would benefit from admission for cardiac monitoring and further workup of exertional dyspnea and severe tachycardia with minimal exertion. EMTALA completed, accepting physician Dr. Marily Memos at [facility].   Case has been discussed with and seen by Dr. Rex Kras who agrees with the above plan for  admission.  Final Clinical Impressions(s) / ED Diagnoses   Final diagnoses:  Shortness of breath  Exertional dyspnea    New Prescriptions New Prescriptions   No medications on file         Gloriann Loan, Hershal Coria 07/04/16 [place], MD 07/05/16 [phone]

## 2016-07-05 ENCOUNTER — Observation Stay (HOSPITAL_BASED_OUTPATIENT_CLINIC_OR_DEPARTMENT_OTHER): Payer: Medicare Other

## 2016-07-05 ENCOUNTER — Other Ambulatory Visit: Payer: Self-pay

## 2016-07-05 DIAGNOSIS — R0600 Dyspnea, unspecified: Secondary | ICD-10-CM | POA: Diagnosis not present

## 2016-07-05 DIAGNOSIS — F1010 Alcohol abuse, uncomplicated: Secondary | ICD-10-CM | POA: Diagnosis not present

## 2016-07-05 DIAGNOSIS — F1490 Cocaine use, unspecified, uncomplicated: Secondary | ICD-10-CM | POA: Diagnosis not present

## 2016-07-05 DIAGNOSIS — J449 Chronic obstructive pulmonary disease, unspecified: Secondary | ICD-10-CM | POA: Diagnosis not present

## 2016-07-05 DIAGNOSIS — I2510 Atherosclerotic heart disease of native coronary artery without angina pectoris: Secondary | ICD-10-CM | POA: Diagnosis not present

## 2016-07-05 DIAGNOSIS — R0602 Shortness of breath: Secondary | ICD-10-CM | POA: Diagnosis not present

## 2016-07-05 DIAGNOSIS — Z720 Tobacco use: Secondary | ICD-10-CM | POA: Diagnosis not present

## 2016-07-05 DIAGNOSIS — F329 Major depressive disorder, single episode, unspecified: Secondary | ICD-10-CM | POA: Diagnosis not present

## 2016-07-05 DIAGNOSIS — J84116 Cryptogenic organizing pneumonia: Secondary | ICD-10-CM | POA: Diagnosis not present

## 2016-07-05 DIAGNOSIS — K219 Gastro-esophageal reflux disease without esophagitis: Secondary | ICD-10-CM | POA: Diagnosis not present

## 2016-07-05 DIAGNOSIS — F17210 Nicotine dependence, cigarettes, uncomplicated: Secondary | ICD-10-CM | POA: Diagnosis not present

## 2016-07-05 DIAGNOSIS — N179 Acute kidney failure, unspecified: Secondary | ICD-10-CM

## 2016-07-05 DIAGNOSIS — F419 Anxiety disorder, unspecified: Secondary | ICD-10-CM | POA: Diagnosis not present

## 2016-07-05 DIAGNOSIS — I252 Old myocardial infarction: Secondary | ICD-10-CM | POA: Diagnosis not present

## 2016-07-05 DIAGNOSIS — R0789 Other chest pain: Secondary | ICD-10-CM | POA: Diagnosis not present

## 2016-07-05 DIAGNOSIS — R079 Chest pain, unspecified: Secondary | ICD-10-CM | POA: Diagnosis not present

## 2016-07-05 DIAGNOSIS — R911 Solitary pulmonary nodule: Secondary | ICD-10-CM | POA: Diagnosis not present

## 2016-07-05 DIAGNOSIS — E785 Hyperlipidemia, unspecified: Secondary | ICD-10-CM | POA: Diagnosis not present

## 2016-07-05 DIAGNOSIS — R000 Tachycardia, unspecified: Secondary | ICD-10-CM | POA: Diagnosis not present

## 2016-07-05 DIAGNOSIS — I10 Essential (primary) hypertension: Secondary | ICD-10-CM | POA: Diagnosis not present

## 2016-07-05 DIAGNOSIS — E291 Testicular hypofunction: Secondary | ICD-10-CM | POA: Diagnosis not present

## 2016-07-05 LAB — SEDIMENTATION RATE: Sed Rate: 3 mm/hr (ref 0–16)

## 2016-07-05 LAB — LIPID PANEL
Cholesterol: 203 mg/dL — ABNORMAL HIGH (ref 0–200)
HDL: 54 mg/dL (ref >40)
LDL Cholesterol: 122 mg/dL — ABNORMAL HIGH (ref 0–99)
Total CHOL/HDL Ratio: 3.8 RATIO
Triglycerides: 134 mg/dL (ref <150)
VLDL: 27 mg/dL (ref 0–40)

## 2016-07-05 LAB — RAPID URINE DRUG SCREEN, HOSP PERFORMED
Amphetamines: NOT DETECTED
Barbiturates: NOT DETECTED
Benzodiazepines: NOT DETECTED
Cocaine: POSITIVE — AB
Opiates: POSITIVE — AB
Tetrahydrocannabinol: NOT DETECTED

## 2016-07-05 LAB — TROPONIN I
Troponin I: <0.03 ng/mL (ref <0.03)
Troponin I: <0.03 ng/mL (ref <0.03)

## 2016-07-05 LAB — ECHOCARDIOGRAM COMPLETE
Height: 69 in
Weight: 2993.6 oz

## 2016-07-05 MED ORDER — LORAZEPAM 2 MG/ML IJ SOLN
0.0000 mg | Freq: Two times a day (BID) | INTRAMUSCULAR | Status: DC
Start: 2016-07-07 — End: 2016-07-06

## 2016-07-05 MED ORDER — THIAMINE HCL 100 MG/ML IJ SOLN: 100.0000 mg | Freq: Every day | INTRAMUSCULAR | Status: DC

## 2016-07-05 MED ORDER — ADULT MULTIVITAMIN W/MINERALS CH
1.0000 | ORAL_TABLET | Freq: Every day | ORAL | Status: DC
  Administered 2016-07-05 – 2016-07-06 (×2): 1 via ORAL
  Filled 2016-07-05 (×2): qty 1

## 2016-07-05 MED ORDER — VITAMIN B-1 100 MG PO TABS
100.0000 mg | ORAL_TABLET | Freq: Every day | ORAL | Status: DC
  Administered 2016-07-05 – 2016-07-06 (×2): 100 mg via ORAL
  Filled 2016-07-05 (×2): qty 1

## 2016-07-05 MED ORDER — LORAZEPAM 1 MG PO TABS: 1.0000 mg | ORAL_TABLET | Freq: Four times a day (QID) | ORAL | Status: DC | PRN

## 2016-07-05 MED ORDER — LORAZEPAM 2 MG/ML IJ SOLN: 1.0000 mg | Freq: Four times a day (QID) | INTRAMUSCULAR | Status: DC | PRN

## 2016-07-05 MED ORDER — IPRATROPIUM BROMIDE 0.02 % IN SOLN: 0.5000 mg | RESPIRATORY_TRACT | Status: DC | PRN

## 2016-07-05 MED ORDER — FOLIC ACID 1 MG PO TABS
1.0000 mg | ORAL_TABLET | Freq: Every day | ORAL | Status: DC
  Administered 2016-07-05 – 2016-07-06 (×2): 1 mg via ORAL
  Filled 2016-07-05 (×2): qty 1

## 2016-07-05 MED ORDER — LORAZEPAM 2 MG/ML IJ SOLN
0.0000 mg | Freq: Four times a day (QID) | INTRAMUSCULAR | Status: DC
Start: 2016-07-05 — End: 2016-07-06
  Administered 2016-07-05 – 2016-07-06 (×2): 2 mg via INTRAVENOUS
  Filled 2016-07-05 (×2): qty 1

## 2016-07-05 NOTE — Progress Notes (Addendum)
PROGRESS NOTE                                                                                                                                                                                                             Patient Demographics:    Richard Franco, is a 51 y.o. male, DOB - 1965-09-23, [id number]  Admit date - 07/04/2016   Admitting Physician Ivor Costa, MD  Outpatient Primary MD for the patient is Redge Gainer, MD  LOS - 0  Outpatient Specialists: Dr Aundra [facility]in Pulmonary  Chief Complaint  Patient presents with  . Shortness of Breath       Brief Narrative   CAD, NSTEMI due to vasospasm (normal [facility] 2014), fibromyalgia, testosterone deficiency, esophageal stricture, GERD, hypertension, depression, pleurisy, lung Nodule, and chronic shortness of breath (due to groundglass opacity and cryptogenic organizing pneumonia, along with tobacco use or pulmonary), who presents with aspirin and worsening shortness of breath on minimal exertion. Patient followed by pulmonary for chronic shortness of breath. He was seen in the pulmonary clinic by PA 2 days prior to admission. Patient has been treated with prednisone 10 mg daily for good response for his COPD and ground glass opacity. His prednisone dose was reduced to 5 mg twice a day during the office visit. Patient informed that his dyspnea had worsened for past several days, aggravated by minimal exertion. Denied any fevers, chills, flulike symptoms or cough. He also had substernal chest pain sharp 4-7/10 in severity radiating to his shoulders and also in his jaws. Patient had left heart catheterization in 2014 for NSTEMI which showed clean coronaries and thought to be due to vascular spasm. Next line In the ED he was tachycardic.. D-dimer was negative. EKG and initial troponin was negative. He had mild acute kidney injury. Chest x-ray was negative.  Patient monitored on  observation.   Subjective:   Reports off-and-on substernal chest pain dyspnea on exertion.   Assessment  & Plan :    Principal Problem:   Chest pain, atypical Nonreproducible. Symptoms worsened on minimal exertion and movement. Chest x-ray unremarkable. Low suspicion for PE. Serial cardiac enzymes negative. Stable on monitor. Consulted cardiology given prior history of NSTEMI vaso-spasm. Sublingual nitrate when necessary. Continue when necessary morphine. Continue aspirin, statin 2-D echo shows normal EF with no wall motion abnormality.  Active Problems:  Chronic dyspnea No clear etiology. No signs of volume overload. Has chronic symptoms due to COP/ GGO and active smoking. Seem to have good response to steroid per pulmonary and does taper down recently. Continue nebs  Tobacco use Counseled strongly on cessation. Nicotine patch.    GERD Continue Protonix  Acute kidney injury Mild. Gentle hydration  Since the hypertension Continue amlodipine  Anxiety and depression   continue Xanax and Prozac   Code Status : FULL CODE  Family Communication  : None at bedside  Disposition Plan  : Home pending cardiology evaluation  Barriers For Discharge : Active symptoms  Consults  :   CARDIOLOGY  Procedures  :  2D echo  DVT Prophylaxis  :  Lovenox -  Lab Results  Component Value Date   PLT 173 07/04/2016    Antibiotics  :    Anti-infectives    None        Objective:   Vitals:   07/05/16 0156 07/05/16 0617 07/05/16 0936 07/05/16 1356  BP: 106/65 110/66 116/67 117/71  Pulse: 89 72 74   Resp: 18 18 18    Temp:  97.5 F (36.4 C) 97.6 F (36.4 C) 97.6 F (36.4 C)  TempSrc:  Oral Oral Oral  SpO2: 96% 96% 97%   Weight:      Height:        Wt Readings from Last 3 Encounters:  07/04/16 84.9 kg (187 lb 1.6 oz)  07/02/16 87.5 kg (193 lb)  04/25/16 84.9 kg (187 lb 3.2 oz)     Intake/Output Summary (Last 24 hours) at 07/05/16 1536 Last data filed at  07/05/16 1051  Gross per 24 hour  Intake             1400 ml  Output             1600 ml  Net             -200 ml     Physical Exam  Gen: not in distress HEENT: , moist mucosa, supple neck Chest: clear b/l, no added soundsOn nonreproducible pain CVS: N S1&S2, no murmurs, rubs or gallop GI: soft, NT, ND,  Musculoskeletal: warm, no edema     Data Review:    CBC  Recent Labs Lab 07/04/16 1423  WBC 6.9  HGB 13.8  HCT 40.1  PLT 173  MCV 89.5  MCH 30.8  MCHC 34.4  RDW 13.1  LYMPHSABS 0.7  MONOABS 0.4  EOSABS 0.0  BASOSABS 0.0    Chemistries   Recent Labs Lab 07/04/16 1423  NA 133*  K 3.7  CL 102  CO2 22  GLUCOSE 106*  BUN 14  CREATININE 1.21  CALCIUM 8.9  AST 19  ALT 19  ALKPHOS 49  BILITOT 0.4   ------------------------------------------------------------------------------------------------------------------  Recent Labs  07/05/16 0222  CHOL 203*  HDL 54  LDLCALC 122*  TRIG 134  CHOLHDL 3.8    Lab Results  Component Value Date   HGBA1C 5.5 11/14/2015   ------------------------------------------------------------------------------------------------------------------ No results for input(s): TSH, T4TOTAL, T3FREE, THYROIDAB in the last 72 hours.  Invalid input(s): FREET3 ------------------------------------------------------------------------------------------------------------------ No results for input(s): VITAMINB12, FOLATE, FERRITIN, TIBC, IRON, RETICCTPCT in the last 72 hours.  Coagulation profile No results for input(s): INR, PROTIME in the last 168 hours.   Recent Labs  07/04/16 1423  DDIMER <0.27    Cardiac Enzymes  Recent Labs Lab 07/04/16 2100 07/05/16 0222 07/05/16 0754  TROPONINI <0.03 <0.03 <0.03   ------------------------------------------------------------------------------------------------------------------    Component  Value Date/Time   BNP 5.0 07/04/2016 1423    Inpatient Medications  Scheduled  Meds: . amLODipine  5 mg Oral Daily  . aspirin EC  325 mg Oral Daily  . atorvastatin  40 mg Oral q1800  . enoxaparin (LOVENOX) injection  40 mg Subcutaneous Q24H  . FLUoxetine  20 mg Oral BID  . hydroxypropyl methylcellulose / hypromellose  1 drop Both Eyes BID  . lamoTRIgine  100 mg Oral BID  . pantoprazole  40 mg Oral Daily  . predniSONE  10 mg Oral Q breakfast  . risperiDONE  2 mg Oral QHS   Continuous Infusions: . sodium chloride 100 mL/hr at 07/05/16 0529   PRN Meds:.acetaminophen, ALPRAZolam, hyoscyamine, ipratropium, morphine injection, nitroGLYCERIN, ondansetron, polyvinyl alcohol, zolpidem  Micro Results No results found for this or any previous visit (from the past 240 hour(s)).  Radiology Reports Dg Chest 2 View  Result Date: 07/04/2016 CLINICAL DATA:  3-4 days of shortness of breath, postprandial nausea, diaphoresis. Current smoker. EXAM: CHEST  2 VIEW COMPARISON:  CT scan of the chest of June 13, 2016 and chest x-ray of April 22, 2016. FINDINGS: The lungs are adequately inflated. There is no focal infiltrate. There is no pleural effusion. The heart and pulmonary vascularity are normal. The mediastinum is normal in width. The bony thorax is unremarkable. IMPRESSION: There is no active cardiopulmonary disease. Electronically Signed   By: David  Martinique M.D.   On: 07/04/2016 14:42   Ct Chest Wo Contrast  Result Date: 06/13/2016 CLINICAL DATA:  Follow-up pulmonary nodules. EXAM: CT CHEST WITHOUT CONTRAST TECHNIQUE: Multidetector CT imaging of the chest was performed following the standard protocol without IV contrast. COMPARISON:  02/05/2016 chest CT.  04/22/2016 chest radiograph. FINDINGS: Cardiovascular: Normal heart size. No significant pericardial fluid/thickening. Great vessels are normal in course and caliber. Mediastinum/Nodes: No discrete thyroid nodules. Unremarkable esophagus. No pathologically enlarged axillary, mediastinal or gross hilar lymph nodes, noting  limited sensitivity for the detection of hilar adenopathy on this noncontrast study. Lungs/Pleura: No pneumothorax. No pleural effusion. The previously described patchy ground-glass opacities in both lungs on the 02/05/2016 chest CT study have resolved. No acute consolidative airspace disease or lung masses. There are at least 8 scattered solid pulmonary nodules throughout both lungs measuring up to 5 mm in the right middle lobe (series 3/ image 102), all unchanged since 06/04/2013 and considered benign. No new significant pulmonary nodules. Upper abdomen: Partially visualized simple appearing exophytic renal cyst in the posterior interpolar left kidney measuring at least 2.1 cm. Musculoskeletal: No aggressive appearing focal osseous lesions. Partially visualized surgical hardware from ACDF in the lower cervical spine. IMPRESSION: 1. Complete resolution of the patchy ground-glass opacities in both lungs described on the 02/05/2016 chest CT study . 2. Greater than 3 year stability of scattered subcentimeter pulmonary nodules, which are considered benign. 3. No active disease in the chest. Electronically Signed   By: Ilona Sorrel M.D.   On: 06/13/2016 10:26    Time Spent in minutes  25   Louellen Molder M.D on 07/05/2016 at 3:36 PM  Between 7am to 7pm - Pager - [phone]  After 7pm go to [email or web address] - password [facility]  [facility] -  Office  [phone]

## 2016-07-05 NOTE — Consult Note (Signed)
Patient ID: Richard Franco MRN: [id number] DOB/AGE: 1966/06/15 51 y.o.  Admit date: 07/04/2016 Referring Physician: Dhungel Primary Physician: Redge Gainer, MD Primary Cardiologist: Aundra Dubin Reason for Consultation: chest pain  HPI: 51 yo male with history of fibromyalgia, chest pain, pulmonary infiltrates (ground glass opacities), anxiety, esophageal stricture and tobacco abuse admitted with chest pain and dyspnea. He has a long history of chest pain and dyspnea with admission in 2013 with the same. Cardiac cath at that time showed normal coronary arteries. CTA chest showed diffuse ground glass infiltrates but pulmonary workup overall negative. Readmitted in 2014 with recurrent chest pain and elevated troponin and repeat cardiac cath with no evidence of coronary artery disease. There was concern for coronary vasospasm and he was started on Imdur but did not tolerate. He was started on Norvasc. He has had further workup with pulmonary with CPX testing suggesting deconditioning as etiology of exertional dyspnea. He is a current smoker. He was admitted to the cardiology service here at [facility] in December 2016 with chest pain. He ruled out for an MI with serial cardiac markers.  He was seen in the ED at [facility] 2017 with chest pain felt to be pleuritic pain.   He is now admitted with chest pain and dyspnea. EKG without ischemic changes. Troponin negative x 2. Chest x-ray without active pulmonary disease. He reports feeling easily fatigued when doing normal chores over last 2 days. Some upper chest pressure. No dizziness, near syncope or syncope. No weight gain, LE edema. He still smoked despite his chronic lung disease.    Past Medical History:  Diagnosis Date  . Abnormal chest CT 03/2012   a. 2013: Scattered patchy ground glass opacities and pulmonary nodules. Transbronchial biopsy with no definite etiology of lung finding- possible organizing pneumonia. b. F/u CT 05/2014: stable multiple  tiny pulm nodules, no further w/u recommended per notes.   . Anxiety   . Arthritis   . Chronic midline posterior neck pain   . Complication of anesthesia    " difficult to urinate after "  . Depression   . Esophageal stricture   . Fibromyalgia   . Fracture    back  . GERD (gastroesophageal reflux disease)   . Migraine headache    "none lately; I've had alot in my life" (06/21/2015)  . NSTEMI (non-ST elevated myocardial infarction) ([place]) 07/2012   a. Normal cath 03/2012. b. 07/2012: troponin 4, normal cors, ?vasospasm. Did not tolerate Imdur due to headache. On amlodipine.  . Testosterone deficiency   . Tobacco abuse     Family History  Problem Relation Age of Onset  . Diabetes Mother   . Coronary artery disease Paternal Uncle   . Asthma Sister   . Colon cancer Neg Hx   . Colon polyps Neg Hx   . Rectal cancer Neg Hx   . Stomach cancer Neg Hx     Social History   Social History  . Marital status: Legally Separated    Spouse name: N/A  . Number of children: N/A  . Years of education: N/A   Occupational History  . Not on file.   Social History Main Topics  . Smoking status: Current Every Day Smoker    Packs/day: 1.00    Years: 27.50    Types: Cigarettes    Last attempt to quit: 04/21/2016  . Smokeless tobacco: Former Systems developer    Types: Chew  . Alcohol use No     Comment: 06/21/2015 "3 beers/day  lately; more lately cause of being separated"   . Drug use: No  . Sexual activity: Yes   Other Topics Concern  . Not on file   Social History Narrative  . No narrative on file    Past Surgical History:  Procedure Laterality Date  . ANKLE FUSION Left 2006  . BACK SURGERY    . CERVICAL FUSION  X7 "last one 04/2011"  . COLONOSCOPY    . CYSTECTOMY    . FRACTURE SURGERY    . LEFT HEART CATHETERIZATION WITH CORONARY ANGIOGRAM N/A 03/25/2012   Procedure: LEFT HEART CATHETERIZATION WITH CORONARY ANGIOGRAM;  Surgeon: Burnell Blanks, MD;  Location: [facility] CATH LAB;  Service:  Cardiovascular;  Laterality: N/A;  . LEFT HEART CATHETERIZATION WITH CORONARY ANGIOGRAM N/A 08/11/2012   Procedure: LEFT HEART CATHETERIZATION WITH CORONARY ANGIOGRAM;  Surgeon: Peter M Martinique, MD;  Location: [facility] CATH LAB;  Service: Cardiovascular;  Laterality: N/A;  . POSTERIOR CERVICAL FUSION/FORAMINOTOMY  05/09/2011   Procedure: POSTERIOR CERVICAL FUSION/FORAMINOTOMY LEVEL 1;  Surgeon: Hosie Spangle;  Location: [facility] NEURO ORS;  Service: Neurosurgery;  Laterality: N/A;  C4/5 posterior arthrodesis with instrumentation   . UPPER GASTROINTESTINAL ENDOSCOPY    . VIDEO BRONCHOSCOPY  05/06/2012   Procedure: VIDEO BRONCHOSCOPY WITH FLUORO;  Surgeon: Jyl Heinz, MD;  Location: [facility] ENDOSCOPY;  Service: Cardiopulmonary;  Laterality: Bilateral;    Allergies  Allergen Reactions  . Midodrine Hcl Swelling    Tongue swelling  . Sulfa Antibiotics Rash  . Sulfonamide Derivatives Rash        Hospital Medications:  . amLODipine  5 mg Oral Daily  . aspirin EC  325 mg Oral Daily  . atorvastatin  40 mg Oral q1800  . enoxaparin (LOVENOX) injection  40 mg Subcutaneous Q24H  . FLUoxetine  20 mg Oral BID  . hydroxypropyl methylcellulose / hypromellose  1 drop Both Eyes BID  . lamoTRIgine  100 mg Oral BID  . pantoprazole  40 mg Oral Daily  . predniSONE  10 mg Oral Q breakfast  . risperiDONE  2 mg Oral QHS    Review of systems complete and found to be negative unless listed above    Physical Exam: Blood pressure 117/71, pulse 74, temperature 97.6 F (36.4 C), temperature source Oral, resp. rate 18, height 5\' 9"  (1.753 m), weight 187 lb 1.6 oz (84.9 kg), SpO2 97 %.    General: Well developed, well nourished, NAD  HEENT: OP clear, mucus membranes moist  SKIN: warm, dry. No rashes. Numerous tattoos including his entire back Neuro: No focal deficits  Musculoskeletal: Muscle strength 5/5 all ext  Psychiatric: Mood and affect normal  Neck: No JVD, no carotid bruits, no thyromegaly, no lymphadenopathy.    Lungs:Clear bilaterally, no wheezes, rhonci, crackles  Cardiovascular: Regular rate and rhythm. No murmurs, gallops or rubs.  Abdomen:Soft. Bowel sounds present. Non-tender.  Extremities: No lower extremity edema. Pulses are 2 + in the bilateral DP/PT.   Labs:   Lab Results  Component Value Date   WBC 6.9 07/04/2016   HGB 13.8 07/04/2016   HCT 40.1 07/04/2016   MCV 89.5 07/04/2016   PLT 173 07/04/2016     Recent Labs Lab 07/04/16 1423  NA 133*  K 3.7  CL 102  CO2 22  BUN 14  CREATININE 1.21  CALCIUM 8.9  PROT 6.9  BILITOT 0.4  ALKPHOS 49  ALT 19  AST 19  GLUCOSE 106*   Troponin: less than 0.03 x 2   Radiology: Chest  x-ray:  The lungs are adequately inflated. There is no focal infiltrate. There is no pleural effusion. The heart and pulmonary vascularity are normal. The mediastinum is normal in width. The bony thorax is unremarkable.  IMPRESSION: There is no active cardiopulmonary disease.  Echo 07/05/16: - Left ventricle: Inferobasal hypokinesis. The cavity size was   normal. Systolic function was normal. The estimated ejection   fraction was in the range of 55% to 60%. Wall motion was normal;   there were no regional wall motion abnormalities. Left   ventricular diastolic function parameters were normal. - Atrial septum: A patent foramen ovale cannot be excluded.  EKG: NSR, no ischemic changes  ASSESSMENT AND PLAN:   1. Chest pain; He has a long history of chest pain and dyspnea. He has had 2 previous cardiac caths with no evidence of CAD (2013 and 2014). There is no objective evidence of ischemia this admission and his pain is atypical. Troponin is negative. EKG shows no ischemic changes. Echo today with normal LV systolic function, no wall motion abnormalities. There has been some suspicion in the past for coronary vasospasm but I do not think his current presentation is c/w a cardiac etiology.   I would not plan further cardiac workup at this time.  Consider pulmonary consultation.   Call us with questions. We will see him as needed.    Signed: Lauree Chandler, MD 07/05/2016, 5:28 PM

## 2016-07-05 NOTE — Care Management Note (Signed)
Case Management Note  Patient Details  Name: Richard Franco MRN: [id number] Date of Birth: 1966-03-26  Subjective/Objective:                Spoke with patient at the bedside. Patient obs for CP, cardiology consulted. From home, independent.    Action/Plan:  Anticipate DC to home self care after workup.     Expected Discharge Date:                  Expected Discharge Plan:  Home/Self Care  In-House Referral:     Discharge planning Services  CM Consult  Post Acute Care Choice:    Choice offered to:     DME Arranged:    DME Agency:     HH Arranged:    [place] Agency:     Status of Service:  Completed, signed off  If discussed at [employer] of Stay Meetings, dates discussed:    Additional Comments:  Carles Collet, RN 07/05/2016, 11:49 AM

## 2016-07-05 NOTE — Progress Notes (Signed)
  Echocardiogram 2D Echocardiogram has been performed.  Aggie Cosier 07/05/2016, 10:16 AM

## 2016-07-05 NOTE — Progress Notes (Signed)
Aspirin not given, med not available.

## 2016-07-05 NOTE — Care Management Obs Status (Signed)
[facility] NOTIFICATION   Patient Details  Name: Richard Franco MRN: [id number] Date of Birth: [birth year]   Medicare Observation Status Notification Given:  Yes    Carles Collet, RN 07/05/2016, 11:49 AM

## 2016-07-06 DIAGNOSIS — F1410 Cocaine abuse, uncomplicated: Secondary | ICD-10-CM

## 2016-07-06 DIAGNOSIS — R221 Localized swelling, mass and lump, neck: Secondary | ICD-10-CM | POA: Diagnosis not present

## 2016-07-06 DIAGNOSIS — F1010 Alcohol abuse, uncomplicated: Secondary | ICD-10-CM | POA: Diagnosis present

## 2016-07-06 DIAGNOSIS — R0789 Other chest pain: Secondary | ICD-10-CM | POA: Diagnosis not present

## 2016-07-06 DIAGNOSIS — N179 Acute kidney failure, unspecified: Secondary | ICD-10-CM | POA: Diagnosis not present

## 2016-07-06 DIAGNOSIS — R0602 Shortness of breath: Secondary | ICD-10-CM | POA: Diagnosis not present

## 2016-07-06 DIAGNOSIS — F1490 Cocaine use, unspecified, uncomplicated: Secondary | ICD-10-CM | POA: Diagnosis present

## 2016-07-06 DIAGNOSIS — I10 Essential (primary) hypertension: Secondary | ICD-10-CM

## 2016-07-06 LAB — HEMOGLOBIN A1C
Hgb A1c MFr Bld: 5.2 % (ref 4.8–5.6)
Mean Plasma Glucose: 103 mg/dL

## 2016-07-06 MED ORDER — NICOTINE 21 MG/24HR TD PT24: 21.0000 mg | MEDICATED_PATCH | Freq: Every day | TRANSDERMAL | 2 refills | Status: DC

## 2016-07-06 MED ORDER — DIPHENHYDRAMINE HCL 50 MG/ML IJ SOLN
25.0000 mg | Freq: Four times a day (QID) | INTRAMUSCULAR | Status: DC | PRN
  Administered 2016-07-06: 25 mg via INTRAVENOUS
  Filled 2016-07-06: qty 1

## 2016-07-06 MED ORDER — PREDNISONE 10 MG PO TABS
10.0000 mg | ORAL_TABLET | Freq: Every day | ORAL | Status: DC
  Filled 2016-07-06: qty 1

## 2016-07-06 MED ORDER — PREDNISONE 10 MG PO TABS: 5.0000 mg | ORAL_TABLET | Freq: Every day | ORAL | 0 refills | Status: AC

## 2016-07-06 MED ORDER — DIPHENHYDRAMINE HCL 25 MG PO CAPS: 25.0000 mg | ORAL_CAPSULE | Freq: Four times a day (QID) | ORAL | 0 refills | Status: DC | PRN

## 2016-07-06 MED ORDER — METHYLPREDNISOLONE SODIUM SUCC 125 MG IJ SOLR
125.0000 mg | Freq: Once | INTRAMUSCULAR | Status: AC
  Administered 2016-07-06: 125 mg via INTRAVENOUS
  Filled 2016-07-06: qty 2

## 2016-07-06 MED ORDER — PREDNISONE 20 MG PO TABS: 40.0000 mg | ORAL_TABLET | Freq: Every day | ORAL | 0 refills | Status: AC

## 2016-07-06 NOTE — Progress Notes (Signed)
Pt complained of throat swelling examined and throat was swollen paged RT and rapid response. [facility] new orders received will continue to monitor. Arthor Captain LPN

## 2016-07-06 NOTE — Discharge Summary (Signed)
Physician Discharge Summary  DOWARD KHAIMOV [id number] DOB: [birth year] DOA: 07/04/2016  PCP: Redge Gainer, MD  Admit date: 07/04/2016 Discharge date: 07/06/2016  Admitted From: Home Disposition:  Home  Recommendations for Outpatient Follow-up:  1. Follow up with PCP And pulmonologist in 2 weeks. 2. I have stopped his amlodipine given concern for angioedema. Please evaluate during outpatient follow-up and resume the medication if appropriate.  Home Health: None Equipment/Devices:none   Discharge condition: Stable  CODE STATUS: Full code  Diet recommendation: Heart Healthy     Discharge Diagnoses:  Principal Problem:   Chest pain, atypical   Active Problems:   Throat swelling   DYSPNEA   History of tobacco abuse   Pulmonary nodule   GERD (gastroesophageal reflux disease)   Hyperlipidemia   Tobacco abuse   History of coronary vasospasm   Chest pain   Essential hypertension   AKI (acute kidney injury) ([place])   Cocaine use   ETOH abuse   Brief narrative/history of present illness 51 year old male with history of CAD, NSTEMI due to vasospasm (normal LHC 2014), fibromyalgia, testosterone deficiency, esophageal stricture, GERD, hypertension, depression, pleurisy, lung Nodule, and chronic shortness of breath (due to groundglass opacity and cryptogenic organizing pneumonia, along with tobacco use or pulmonary), who presents with aspirin and worsening shortness of breath on minimal exertion. Patient followed by pulmonary for chronic shortness of breath. He was seen in the pulmonary clinic by PA 2 days prior to admission. Patient has been treated with prednisone 10 mg daily for good response for his COPD and ground glass opacity. His prednisone dose was reduced to 5 mg twice a day during the office visit. Patient informed that his dyspnea had worsened for past several days, aggravated by minimal exertion. Denied any fevers, chills, flulike symptoms or cough. He also had  substernal chest pain sharp 4-7/10 in severity radiating to his shoulders and also in his jaws. Patient had left heart catheterization in 2014 for NSTEMI which showed clean coronaries and thought to be due to vascular spasm. Next line In the ED he was tachycardic.. D-dimer was negative. EKG and initial troponin was negative. He had mild acute kidney injury. Chest x-ray was negative.  Patient monitored on observation.  Hospital course  Principal Problem:   Chest pain, atypical Nonreproducible. Symptoms worsened on minimal exertion and movement. Chest x-ray unremarkable. Low suspicion for PE. Serial cardiac enzymes negative. Stable on monitor. Consulted cardiology given prior history of NSTEMI vaso-spasm. Patient was evaluated and felt that this is not cardiac in nature. Recommend pulmonary evaluation. 2-D echo shows normal EF with no wall motion abnormality.  Patient's drug screen was positive for cocaine. He initially refused taking cocaine but on pressing further agreed that accepted that he did some cocaine 2 days prior to admission. He also continues to smoke heavily. Cocaine induced spasm is a likely possibility.   His symptoms have resolved and can be discharged home on home dose dose aspirin.  Active Problems:   Chronic dyspnea No clear etiology. Has been following with pulmonary. No signs of volume overload. Has chronic symptoms due to COP/ GGO and active smoking. Prednisone recently tapered down since groundglass opacity and cryptogenic organizing pneumonia seems to have resolved on repeat imaging. Patient reports his dyspnea to be better this morning. He should follow-up with his pulmonologist in 2 weeks. (Patient on tapering dose of oral prednisone which he completes after 4 weeks).   Tobacco use/substance use Counseled strongly on cessation. Nicotine patch prescribed. Strongly on alcohol and  cocaine cessation. Girlfriend at bedside and patient plans to quit.  Throat  swelling on 1/12. Issue complains of throat swelling this morning. On exam he was found to have some swelling in the posterior pharyngeal wall and the uvula. No tongue or lip swelling noted. Patient informed that he ate some Doritos early this morning after which his throat felt funny. No airway compromise noted. Given 125 mg IV Solu-Medrol push and Benadryl after which he felt better. On my exam this morning he has minimal swelling in the uvula and posterior pharyngeal wall. Not sure what contributed to it, whether it's food or his medication (full dose aspirin, NSAIDs or amlodipine). I will discharge him on oral prednisone 40 mg daily for 5 days (after that he should be back on his tapered prednisone regime). Also prescribed some Benadryl. I'm going to hold his amlodipine until his followed up in the office.   GERD Continue Protonix  Acute kidney injury Improved with IV fluids    Anxiety and depression   continue Xanax and Prozac     Family Communication  : Girlfriend at bedside  Disposition Plan  : Home   Consults  :   CARDIOLOGY  Procedures  :  2D echo   Discharge Instructions   Allergies as of 07/06/2016      Reactions   Midodrine Hcl Swelling   Tongue swelling   Sulfa Antibiotics Rash   Sulfonamide Derivatives Rash          Medication List    STOP taking these medications   amLODipine 5 MG tablet Commonly known as:  NORVASC   ibuprofen 200 MG tablet Commonly known as:  ADVIL,MOTRIN     TAKE these medications   albuterol 108 (90 Base) MCG/ACT inhaler Commonly known as:  PROVENTIL HFA;VENTOLIN HFA Inhale 2 puffs into the lungs every 6 (six) hours as needed for wheezing or shortness of breath.   aspirin 81 MG EC tablet Take 1 tablet (81 mg total) by mouth daily.   diphenhydrAMINE 25 mg capsule Commonly known as:  BENADRYL Take 1 capsule (25 mg total) by mouth every 6 (six) hours as needed for allergies (throat swelling).   esomeprazole 40  MG capsule Commonly known as:  NEXIUM Take 1 capsule (40 mg total) by mouth 2 (two) times daily before a meal.   FLUoxetine 20 MG capsule Commonly known as:  PROZAC Take 20 mg by mouth 2 (two) times daily.   lamoTRIgine 100 MG tablet Commonly known as:  LAMICTAL Take 100 mg by mouth 2 (two) times daily.   nicotine 21 mg/24hr patch Commonly known as:  NICODERM CQ Place 1 patch (21 mg total) onto the skin daily.   nitroGLYCERIN 0.4 MG SL tablet Commonly known as:  NITROSTAT DISSOLVE ONE TABLET UNDER THE TONGUE EVERY 5 MINUTES AS NEEDED FOR CHEST PAIN.  DO NOT EXCEED A TOTAL OF 3 DOSES IN 15 MINUTES   ondansetron 4 MG tablet Commonly known as:  ZOFRAN Take 1 tablet (4 mg total) by mouth as needed for nausea or vomiting (once daily as needed).   predniSONE 20 MG tablet Commonly known as:  DELTASONE Take 2 tablets (40 mg total) by mouth daily with breakfast. What changed:  You were already taking a medication with the same name, and this prescription was added. Make sure you understand how and when to take each.   predniSONE 10 MG tablet Commonly known as:  DELTASONE Take 0.5 tablets (5 mg total) by mouth daily with breakfast. Take  5 mg daily for 2 weeks then 5 mg every other day for 2 weeks then stop Start taking on:  07/11/2016 What changed:  how much to take  additional instructions   REFRESH OP Place 1 drop into both eyes daily as needed (dry eyes/ irritation).   risperiDONE 2 MG tablet Commonly known as:  RISPERDAL Take 2 mg by mouth at bedtime.      Follow-up Information    Redge Gainer, MD. Schedule an appointment as soon as possible for a visit in 1 week(s).   Specialty:  Family Medicine Contact information: [place] [address] [phone]        RAMASWAMY,MURALI, MD. Schedule an appointment as soon as possible for a visit in 2 week(s).   Specialty:  Pulmonary Disease Contact information: [place]  [phone]          Allergies  Allergen Reactions  . Midodrine Hcl Swelling    Tongue swelling  . Sulfa Antibiotics Rash  . Sulfonamide Derivatives Rash        Procedures/Studies: Dg Chest 2 View  Result Date: 07/04/2016 CLINICAL DATA:  3-4 days of shortness of breath, postprandial nausea, diaphoresis. Current smoker. EXAM: CHEST  2 VIEW COMPARISON:  CT scan of the chest of June 13, 2016 and chest x-ray of April 22, 2016. FINDINGS: The lungs are adequately inflated. There is no focal infiltrate. There is no pleural effusion. The heart and pulmonary vascularity are normal. The mediastinum is normal in width. The bony thorax is unremarkable. IMPRESSION: There is no active cardiopulmonary disease. Electronically Signed   By: David  Martinique M.D.   On: 07/04/2016 14:42   Ct Chest Wo Contrast  Result Date: 06/13/2016 CLINICAL DATA:  Follow-up pulmonary nodules. EXAM: CT CHEST WITHOUT CONTRAST TECHNIQUE: Multidetector CT imaging of the chest was performed following the standard protocol without IV contrast. COMPARISON:  02/05/2016 chest CT.  04/22/2016 chest radiograph. FINDINGS: Cardiovascular: Normal heart size. No significant pericardial fluid/thickening. Great vessels are normal in course and caliber. Mediastinum/Nodes: No discrete thyroid nodules. Unremarkable esophagus. No pathologically enlarged axillary, mediastinal or gross hilar lymph nodes, noting limited sensitivity for the detection of hilar adenopathy on this noncontrast study. Lungs/Pleura: No pneumothorax. No pleural effusion. The previously described patchy ground-glass opacities in both lungs on the 02/05/2016 chest CT study have resolved. No acute consolidative airspace disease or lung masses. There are at least 8 scattered solid pulmonary nodules throughout both lungs measuring up to 5 mm in the right middle lobe (series 3/ image 102), all unchanged since 06/04/2013 and considered benign. No new significant pulmonary  nodules. Upper abdomen: Partially visualized simple appearing exophytic renal cyst in the posterior interpolar left kidney measuring at least 2.1 cm. Musculoskeletal: No aggressive appearing focal osseous lesions. Partially visualized surgical hardware from ACDF in the lower cervical spine. IMPRESSION: 1. Complete resolution of the patchy ground-glass opacities in both lungs described on the 02/05/2016 chest CT study . 2. Greater than 3 year stability of scattered subcentimeter pulmonary nodules, which are considered benign. 3. No active disease in the chest. Electronically Signed   By: Ilona Sorrel M.D.   On: 06/13/2016 10:26    2-D echo  - Left ventricle: Inferobasal hypokinesis. The cavity size was   normal. Systolic function was normal. The estimated ejection   fraction was in the range of 55% to 60%. Wall motion was normal;   there were no regional wall motion abnormalities. Left   ventricular diastolic function  parameters were normal. - Atrial septum: A patent foramen ovale cannot be excluded.   Subjective:  patient complained of throat swelling early this morning. RRT was called. Given IV Solu-Medrol and Benadryl after which symptoms improved. Feels much better during my evaluation this morning. No further chest pain or dyspnea.  Discharge Exam: Vitals:   07/06/16 0121 07/06/16 0547  BP: 121/78 129/86  Pulse: 91 (!) 105  Resp:  (!) 22  Temp:     Vitals:   07/05/16 2000 07/05/16 2102 07/06/16 0121 07/06/16 0547  BP: 140/70 134/80 121/78 129/86  Pulse: 79 96 91 (!) 105  Resp:  16  (!) 22  Temp:  98.1 F (36.7 C)    TempSrc:  Oral    SpO2:  97%  98%  Weight:      Height:        General:  not in acute distress HEENT: Moist and clear, mild swelling of the uvula and posterior pharynx, no lip or tongue swelling  chest: Clear to auscultation bilaterally CVS: Normal S1 and S2, no murmurs or gallop  GI: Soft, nondistended, nontender Culture: Warm, no edema    The results of  significant diagnostics from this hospitalization (including imaging, microbiology, ancillary and laboratory) are listed below for reference.     Microbiology: No results found for this or any previous visit (from the past 240 hour(s)).   Labs: BNP (last 3 results)  Recent Labs  07/04/16 1423  BNP 5.0   Basic Metabolic Panel:  Recent Labs Lab 07/04/16 1423  NA 133*  K 3.7  CL 102  CO2 22  GLUCOSE 106*  BUN 14  CREATININE 1.21  CALCIUM 8.9   Liver Function Tests:  Recent Labs Lab 07/04/16 1423  AST 19  ALT 19  ALKPHOS 49  BILITOT 0.4  PROT 6.9  ALBUMIN 4.1    Recent Labs Lab 07/04/16 1423  LIPASE 21   No results for input(s): AMMONIA in the last 168 hours. CBC:  Recent Labs Lab 07/04/16 1423  WBC 6.9  NEUTROABS 5.7  HGB 13.8  HCT 40.1  MCV 89.5  PLT 173   Cardiac Enzymes:  Recent Labs Lab 07/04/16 1423 07/04/16 2100 07/05/16 0222 07/05/16 0754  TROPONINI <0.03 <0.03 <0.03 <0.03   BNP: Invalid input(s): POCBNP CBG: No results for input(s): GLUCAP in the last 168 hours. D-Dimer  Recent Labs  07/04/16 1423  DDIMER <0.27   Hgb A1c  Recent Labs  07/05/16 0222  HGBA1C 5.2   Lipid Profile  Recent Labs  07/05/16 0222  CHOL 203*  HDL 54  LDLCALC 122*  TRIG 134  CHOLHDL 3.8   Thyroid function studies No results for input(s): TSH, T4TOTAL, T3FREE, THYROIDAB in the last 72 hours.  Invalid input(s): FREET3 Anemia work up No results for input(s): VITAMINB12, FOLATE, FERRITIN, TIBC, IRON, RETICCTPCT in the last 72 hours. Urinalysis    Component Value Date/Time   COLORURINE YELLOW 07/04/2016 1423   APPEARANCEUR CLEAR 07/04/2016 1423   LABSPEC 1.002 (L) 07/04/2016 1423   PHURINE 5.5 07/04/2016 1423   GLUCOSEU NEGATIVE 07/04/2016 1423   HGBUR NEGATIVE 07/04/2016 1423   BILIRUBINUR NEGATIVE 07/04/2016 1423   BILIRUBINUR neg 06/14/2014 1056   KETONESUR NEGATIVE 07/04/2016 1423   PROTEINUR NEGATIVE 07/04/2016 1423    UROBILINOGEN negative 06/14/2014 1056   UROBILINOGEN 0.2 08/10/2012 1537   NITRITE NEGATIVE 07/04/2016 1423   LEUKOCYTESUR NEGATIVE 07/04/2016 1423   Sepsis Labs Invalid input(s): PROCALCITONIN,  WBC,  LACTICIDVEN Microbiology No results found  for this or any previous visit (from the past 240 hour(s)).   Time coordinating discharge: <30 minutes  SIGNED:   Louellen Molder, MD  [facility] 07/06/2016, 12:07 PM Pager   If 7PM-7AM, please contact night-coverage [email or web address] Password [email or web address]

## 2016-07-06 NOTE — Progress Notes (Signed)
Nsg Discharge Note  Admit Date:  07/04/2016 Discharge date: 07/06/2016   [name] to be D/C'd Home per MD order.  AVS completed.  Copy for chart, and copy for patient signed, and dated. Patient/caregiver able to verbalize understanding.  Discharge Medication: Allergies as of 07/06/2016      Reactions   Midodrine Hcl Swelling   Tongue swelling   Sulfa Antibiotics Rash   Sulfonamide Derivatives Rash          Medication List    STOP taking these medications   amLODipine 5 MG tablet Commonly known as:  NORVASC   ibuprofen 200 MG tablet Commonly known as:  ADVIL,MOTRIN     TAKE these medications   albuterol 108 (90 Base) MCG/ACT inhaler Commonly known as:  PROVENTIL HFA;VENTOLIN HFA Inhale 2 puffs into the lungs every 6 (six) hours as needed for wheezing or shortness of breath.   aspirin 81 MG EC tablet Take 1 tablet (81 mg total) by mouth daily.   diphenhydrAMINE 25 mg capsule Commonly known as:  BENADRYL Take 1 capsule (25 mg total) by mouth every 6 (six) hours as needed for allergies (throat swelling).   esomeprazole 40 MG capsule Commonly known as:  NEXIUM Take 1 capsule (40 mg total) by mouth 2 (two) times daily before a meal.   FLUoxetine 20 MG capsule Commonly known as:  PROZAC Take 20 mg by mouth 2 (two) times daily.   lamoTRIgine 100 MG tablet Commonly known as:  LAMICTAL Take 100 mg by mouth 2 (two) times daily.   nicotine 21 mg/24hr patch Commonly known as:  NICODERM CQ Place 1 patch (21 mg total) onto the skin daily.   nitroGLYCERIN 0.4 MG SL tablet Commonly known as:  NITROSTAT DISSOLVE ONE TABLET UNDER THE TONGUE EVERY 5 MINUTES AS NEEDED FOR CHEST PAIN.  DO NOT EXCEED A TOTAL OF 3 DOSES IN 15 MINUTES   ondansetron 4 MG tablet Commonly known as:  ZOFRAN Take 1 tablet (4 mg total) by mouth as needed for nausea or vomiting (once daily as needed).   predniSONE 20 MG tablet Commonly known as:  DELTASONE Take 2 tablets (40 mg total) by mouth  daily with breakfast. What changed:  You were already taking a medication with the same name, and this prescription was added. Make sure you understand how and when to take each.   predniSONE 10 MG tablet Commonly known as:  DELTASONE Take 0.5 tablets (5 mg total) by mouth daily with breakfast. Take 5 mg daily for 2 weeks then 5 mg every other day for 2 weeks then stop Start taking on:  07/11/2016 What changed:  how much to take  additional instructions   REFRESH OP Place 1 drop into both eyes daily as needed (dry eyes/ irritation).   risperiDONE 2 MG tablet Commonly known as:  RISPERDAL Take 2 mg by mouth at bedtime.       Discharge Assessment: Vitals:   07/06/16 0121 07/06/16 0547  BP: 121/78 129/86  Pulse: 91 (!) 105  Resp:  (!) 22  Temp:     Skin clean, dry and intact without evidence of skin break down, no evidence of skin tears noted. IV catheter discontinued intact. Site without signs and symptoms of complications - no redness or edema noted at insertion site, patient denies c/o pain - only slight tenderness at site.  Dressing with slight pressure applied.  D/c Instructions-Education: Discharge instructions given to patient/family with verbalized understanding. D/c education completed with patient/family including follow  up instructions, medication list, d/c activities limitations if indicated, with other d/c instructions as indicated by MD - patient able to verbalize understanding, all questions fully answered. Patient instructed to return to ED, call 911, or call MD for any changes in condition.  Patient escorted via [place], and D/C home via private auto.  Ellajane Stong Margaretha Sheffield, RN 07/06/2016 2:26 PM

## 2016-07-06 NOTE — Progress Notes (Signed)
Called to the room per LPN for throat swelling. On arrival noted no respiratory distress or difficulty breathing. Pt is able to talk in complete sentences without complications noted. Pt stated that his throat "feels funny". Pt states that he ate some Doritos (chips) prior to laying down and soon thereafter he says his throat feels funny. Pt does state some difficulty swallowing. No apparent complications, enlarge tongue swelling, or drooling noted at this time. MD order IV push 125mg  Solumedrol one time dose. Tolerated it well. LPN stated will monitor the patient. RRT notified LPN, and Rapid RN to let me know it needed. Pt is stable at this time.  Vadis Slabach L. Tamala Julian, BS, RRT, RCP

## 2016-07-23 ENCOUNTER — Encounter: Payer: Self-pay | Admitting: Adult Health

## 2016-07-23 ENCOUNTER — Ambulatory Visit (INDEPENDENT_AMBULATORY_CARE_PROVIDER_SITE_OTHER): Payer: Medicare Other | Admitting: Adult Health

## 2016-07-23 DIAGNOSIS — R918 Other nonspecific abnormal finding of lung field: Secondary | ICD-10-CM

## 2016-07-23 DIAGNOSIS — Z87891 Personal history of nicotine dependence: Secondary | ICD-10-CM | POA: Diagnosis not present

## 2016-07-23 DIAGNOSIS — F1410 Cocaine abuse, uncomplicated: Secondary | ICD-10-CM

## 2016-07-23 DIAGNOSIS — R5382 Chronic fatigue, unspecified: Secondary | ICD-10-CM | POA: Diagnosis not present

## 2016-07-23 DIAGNOSIS — R0789 Other chest pain: Secondary | ICD-10-CM | POA: Diagnosis not present

## 2016-07-23 DIAGNOSIS — F1490 Cocaine use, unspecified, uncomplicated: Secondary | ICD-10-CM

## 2016-07-23 NOTE — Patient Instructions (Addendum)
Taper off of prednisone as discussed- prednisone 5mg  daily for 4 days then every other day for 1 week and stop .  Follow up with Primary MD .  Work on not smoking .  Follow up Dr. Chase Caller in 2 months and As needed   Please contact office for sooner follow up if symptoms do not improve or worsen or seek emergency care

## 2016-07-23 NOTE — Assessment & Plan Note (Signed)
-  follow up with PCP

## 2016-07-23 NOTE — Progress Notes (Signed)
@Patient  ID: Richard Franco, male    DOB: 07-16-1965, 51 y.o.   MRN: [id number]  Chief Complaint  Patient presents with  . Follow-up    Hosp follow up     Referring provider: Chipper Herb, MD  HPI: 51 year old male smoker followed for dyspnea and GGO on CT with initial presentation 2013 ? COP /smoking related  -previously resolved with steroids . Extensive workup for dyspnea.  Seen at [facility] by Dr. Randol Kern 2014.   TEST /Significant events  07/02/11 present visit -CT chest with resolution of GGO  - persistent nodules  -steroids decreased to 5 mg  05/06/12 Bronchoscopy with BAL, cell count and TBBx of the lingula  BAL cell count with elevated macrophages (possible COP, Hypersensitivity pneumonitis vs chronic microaspiration, possible Aluminum exposure)  RVP neg Viral culture neg  AFB smear neg, culture negative  Cytology/pathology benign with no evidence of aspiration or ILD  /22/13 Remains on 10 mg steroids;  -methacholine challenge neg -barium swallow WNL Alpha 1 MS 125   09/16/12  -CT with no GGO, lung nodules stable  Previous cardiopulmonary stress test in 2014 showed deconditioning. 2D echo in 2013 showed grade 1 diastolic dysfunction with EF of 55-60%. Coronary angiography in 2014 showed normal coronary anatomy 04/2016 normal with no restriction or airflow obstruction  2017 Exhaled nitric oxide today in the office i 11 ppb and normal CT chest 01/2016 showed GGO bilaterally greatest in RML/RUL .  CT chest 12/21 showed complete resolution of GGO   07/23/2016 Follow up : GGO on CT -Prob COP (smoking related) -steroid responsive.  Pt returns for post hospital follow up . Pt was admitted 2 weeks ago for atypical chest pain . Cardiac enzymes were neg. Echo showed nml EF w/ on wall motion abn. Drug screen was positive for Cocaine .  Pt returns today , says since discharge he says chest pain has resolved and no reoccurence . He has not smoking cigarettes. Discussed cessation  and cocaine use /dangers./along drug cessation  He says he continues to have no energy .  Denies cough .  He is currently on prednisone 5mg  (slow taper ) for suspected COP . Last CT chest and CXR showed resolution of GGO.  We discussed further taper to off.    Allergies  Allergen Reactions  . Midodrine Hcl Swelling    Tongue swelling  . Sulfa Antibiotics Rash  . Sulfonamide Derivatives Rash         Immunization History  Administered Date(s) Administered  . Influenza Split 04/16/2012  . Influenza,inj,Quad PF,36+ Mos 03/01/2013, 05/08/2015  . Influenza-Unspecified 04/22/2016  . Pneumococcal Polysaccharide-23 04/16/2012    Past Medical History:  Diagnosis Date  . Abnormal chest CT 03/2012   a. 2013: Scattered patchy ground glass opacities and pulmonary nodules. Transbronchial biopsy with no definite etiology of lung finding- possible organizing pneumonia. b. F/u CT 05/2014: stable multiple tiny pulm nodules, no further w/u recommended per notes.   . Anxiety   . Arthritis   . Chronic midline posterior neck pain   . Complication of anesthesia    " difficult to urinate after "  . Depression   . Esophageal stricture   . Fibromyalgia   . Fracture    back  . GERD (gastroesophageal reflux disease)   . Migraine headache    "none lately; I've had alot in my life" (06/21/2015)  . NSTEMI (non-ST elevated myocardial infarction) ([facility]) 07/2012   a. Normal cath 03/2012. b. 07/2012: troponin 4, normal cors, ?  vasospasm. Did not tolerate Imdur due to headache. On amlodipine.  . Testosterone deficiency   . Tobacco abuse     Tobacco History: History  Smoking Status  . Former Smoker  . Packs/day: 1.00  . Years: 27.50  . Types: Cigarettes  . Quit date: 07/04/2016  Smokeless Tobacco  . Former Systems developer  . Types: Chew   Counseling given: Not Answered   Outpatient Encounter Prescriptions as of 07/23/2016  Medication Sig  . albuterol (PROVENTIL HFA;VENTOLIN HFA) 108 (90 Base) MCG/ACT  inhaler Inhale 2 puffs into the lungs every 6 (six) hours as needed for wheezing or shortness of breath.  Marland Kitchen aspirin EC 81 MG EC tablet Take 1 tablet (81 mg total) by mouth daily.  Marland Kitchen esomeprazole (NEXIUM) 40 MG capsule Take 1 capsule (40 mg total) by mouth 2 (two) times daily before a meal.  . FLUoxetine (PROZAC) 20 MG capsule Take 20 mg by mouth 2 (two) times daily.   Marland Kitchen lamoTRIgine (LAMICTAL) 100 MG tablet Take 100 mg by mouth 2 (two) times daily.  . nitroGLYCERIN (NITROSTAT) 0.4 MG SL tablet DISSOLVE ONE TABLET UNDER THE TONGUE EVERY 5 MINUTES AS NEEDED FOR CHEST PAIN.  DO NOT EXCEED A TOTAL OF 3 DOSES IN 15 MINUTES  . ondansetron (ZOFRAN) 4 MG tablet Take 1 tablet (4 mg total) by mouth as needed for nausea or vomiting (once daily as needed).  . Polyvinyl Alcohol-Povidone (REFRESH OP) Place 1 drop into both eyes daily as needed (dry eyes/ irritation).  . predniSONE (DELTASONE) 10 MG tablet Take 0.5 tablets (5 mg total) by mouth daily with breakfast. Take 5 mg daily for 2 weeks then 5 mg every other day for 2 weeks then stop  . risperiDONE (RISPERDAL) 2 MG tablet Take 2 mg by mouth at bedtime.  . diphenhydrAMINE (BENADRYL) 25 mg capsule Take 1 capsule (25 mg total) by mouth every 6 (six) hours as needed for allergies (throat swelling). (Patient not taking: Reported on 07/23/2016)  . nicotine (NICODERM CQ) 21 mg/24hr patch Place 1 patch (21 mg total) onto the skin daily. (Patient not taking: Reported on 07/23/2016)   No facility-administered encounter medications on file as of 07/23/2016.      Review of Systems  Constitutional:   No  weight loss, night sweats,  Fevers, chills, + fatigue, or  lassitude.  HEENT:   No headaches,  Difficulty swallowing,  Tooth/dental problems, or  Sore throat,                No sneezing, itching, ear ache, nasal congestion, post nasal drip,   CV:  No chest pain,  Orthopnea, PND, swelling in lower extremities, anasarca, dizziness, palpitations, syncope.   GI  No  heartburn, indigestion, abdominal pain, nausea, vomiting, diarrhea, change in bowel habits, loss of appetite, bloody stools.   Resp:    No chest wall deformity  Skin: no rash or lesions.  GU: no dysuria, change in color of urine, no urgency or frequency.  No flank pain, no hematuria   MS:  No joint pain or swelling.  No decreased range of motion.  No back pain.    Physical Exam  BP 122/78   Pulse 83   Temp 97 F (36.1 C) (Oral)   Ht 5\' 9"  (1.753 m)   SpO2 97%   GEN: A/Ox3; pleasant , NAD    HEENT:  /AT,  EACs-clear, TMs-wnl, NOSE-clear, THROAT-clear, no lesions, no postnasal drip or exudate noted.   NECK:  Supple w/ fair ROM; no  JVD; normal carotid impulses w/o bruits; no thyromegaly or nodules palpated; no lymphadenopathy.    RESP  Clear  P & A; w/o, wheezes/ rales/ or rhonchi. no accessory muscle use, no dullness to percussion  CARD:  RRR, no m/r/g, no peripheral edema, pulses intact, no cyanosis or clubbing.  GI:   Soft & nt; nml bowel sounds; no organomegaly or masses detected.   Musco: Warm bil, no deformities or joint swelling noted.   Neuro: alert, no focal deficits noted.    Skin: Warm, no lesions or rashes  Psych:  No change in mood or affect. No depression or anxiety.  No memory loss.  Lab Results:  CBC    Component Value Date/Time   WBC 6.9 07/04/2016 1423   RBC 4.48 07/04/2016 1423   HGB 13.8 07/04/2016 1423   HCT 40.1 07/04/2016 1423   HCT 44.3 09/18/2015 0845   PLT 173 07/04/2016 1423   PLT 235 09/18/2015 0845   MCV 89.5 07/04/2016 1423   MCV 93 09/18/2015 0845   MCH 30.8 07/04/2016 1423   MCHC 34.4 07/04/2016 1423   RDW 13.1 07/04/2016 1423   RDW 14.1 09/18/2015 0845   LYMPHSABS 0.7 07/04/2016 1423   LYMPHSABS 1.3 09/18/2015 0845   MONOABS 0.4 07/04/2016 1423   EOSABS 0.0 07/04/2016 1423   EOSABS 0.2 09/18/2015 0845   BASOSABS 0.0 07/04/2016 1423   BASOSABS 0.0 09/18/2015 0845    BMET    Component Value Date/Time   NA 133 (L)  07/04/2016 1423   NA 142 09/18/2015 0845   K 3.7 07/04/2016 1423   CL 102 07/04/2016 1423   CO2 22 07/04/2016 1423   GLUCOSE 106 (H) 07/04/2016 1423   BUN 14 07/04/2016 1423   BUN 11 09/18/2015 0845   CREATININE 1.21 07/04/2016 1423   CREATININE 1.14 03/24/2012 1646   CALCIUM 8.9 07/04/2016 1423   GFRNONAA >60 07/04/2016 1423   GFRAA >60 07/04/2016 1423    BNP    Component Value Date/Time   BNP 5.0 07/04/2016 1423    ProBNP    Component Value Date/Time   PROBNP 4.0 05/05/2012 1105    Imaging: Dg Chest 2 View  Result Date: 07/04/2016 CLINICAL DATA:  3-4 days of shortness of breath, postprandial nausea, diaphoresis. Current smoker. EXAM: CHEST  2 VIEW COMPARISON:  CT scan of the chest of June 13, 2016 and chest x-ray of April 22, 2016. FINDINGS: The lungs are adequately inflated. There is no focal infiltrate. There is no pleural effusion. The heart and pulmonary vascularity are normal. The mediastinum is normal in width. The bony thorax is unremarkable. IMPRESSION: There is no active cardiopulmonary disease. Electronically Signed   By: David  Martinique M.D.   On: 07/04/2016 14:42     Assessment & Plan:   Pulmonary infiltrates Steroid responsive GGO suspected COP  CT chest in 05/2016 and CXR 06/2016 w/ resolution Will taper to off over next 2 weeks   Plan  Patient Instructions  Taper off of prednisone as discussed- prednisone 5mg  daily for 4 days then every other day for 1 week and stop .  Follow up with Primary MD .  Work on not smoking .  Follow up Dr. Chase Caller in 2 months and As needed   Please contact office for sooner follow up if symptoms do not improve or worsen or seek emergency care       History of tobacco abuse Cessation encouraged.   Chronic fatigue follow up with PCP   Chest pain,  atypical Resolved   Cocaine use Cessation discussed      Rexene Edison, NP 07/23/2016

## 2016-07-23 NOTE — Assessment & Plan Note (Signed)
Resolved

## 2016-07-23 NOTE — Assessment & Plan Note (Signed)
Steroid responsive GGO suspected COP  CT chest in 05/2016 and CXR 06/2016 w/ resolution Will taper to off over next 2 weeks   Plan  Patient Instructions  Taper off of prednisone as discussed- prednisone 5mg  daily for 4 days then every other day for 1 week and stop .  Follow up with Primary MD .  Work on not smoking .  Follow up Dr. Chase Caller in 2 months and As needed   Please contact office for sooner follow up if symptoms do not improve or worsen or seek emergency care

## 2016-07-23 NOTE — Assessment & Plan Note (Signed)
Cessation encouraged 

## 2016-07-23 NOTE — Assessment & Plan Note (Signed)
Cessation discussed 

## 2016-07-30 ENCOUNTER — Ambulatory Visit: Payer: Medicare Other | Admitting: Nurse Practitioner

## 2016-08-19 ENCOUNTER — Ambulatory Visit: Payer: Medicare Other | Admitting: Nurse Practitioner

## 2016-08-19 NOTE — Progress Notes (Deleted)
CARDIOLOGY OFFICE NOTE  Date:  08/19/2016    Richard Franco Date of Birth: 1966-04-15 Medical Record [id number]  PCP:  Redge Gainer, MD  Cardiologist:  Servando Snare & ***    No chief complaint on file.   History of Present Illness: Richard Franco is a 51 y.o. male who presents today for a ***  Seen for Dr. Aundra Dubin.   He has a history of chest pain and ground glass infiltrates on lung imaging and was initially admitted in the past with pleuritic chest pain and exertional dyspnea in 10/13. He ended up having a left heart cath, which showed no significant coronary disease. Echo was normal-appearing. CTA of the chest, however, showed diffuse ground glass infiltrates. Patient was treated with steroids and has been followed by pulmonary. The steroids significantly improved his symptoms. He had a transbronchial biopsy with no definite etiology lung findings noted, possible organizing pneumonia. He was actually referred to pulmonary at [facility]. He says they just told him that whatever process it was that he had seemed to have resolved. He was readmitted in 2/14 with chest pain and had troponin elevation to 4.42. LHC was done again, again demonstrating normal coronaries. Due to concern for coronary vasospasm, he was started on Imdur but was unable to tolerate due to headache so diltiazem CD was begun and titrated up to 360 mg daily, this was transitioned eventually to amlodipine. He also had polyarthralgias and was referred to rheumatology. Autoimmune workup was negative. He was seen by pulmonary and had a CPX, suggesting deconditioning as a cause for his exertional dyspnea.   Last seen back in June of 2015 by Dr. Aundra Dubin. Rare chest pain noted at that time.   I saw him back in September of 2016 - back smoking - having more chest pain. Some palpitations. Not exercising regularly. Using more sl NTG. I put him on Lopressor - he was going to consider rechallenging with Imdur. Last  visit with me was back in November of 2016. Did not tolerate the metoprolol - made him too tired - took about 1/2 the prescription. He was otherwise felt to be doing ok. Had stopped smoking but admitted he could do better with general health measures.   In the ER earlier this week with chest pain - had had alcohol intake the day prior. Had GERD symptoms as well as a cough. Back smoking. Discharged with NSAIDs and advised to follow up here. EKG negative. Troponins normal.   Comes in today. Here alone. Feels like he has pleurisy again - notes similar presentation with his "ground glass presentation" that he had several years ago. Wonders if he needs a CT scan. His chest hurts with deep inspiration. Non productive cough. Has had this for about 2 months now - got worse on Sunday night and he panicked and went on the ER. Now on NSAID.  Leaving for [place] later this month. Not very active. Has stopped smoking since Sunday but asking for patches to have on hand. Labs are all up to date except for his lipids. He is fasting. Has not really tolerated any other cardiac medicines except for Norvasc.   Past Medical History:  1. Headache  2. Osteoarthritis  3. Depression  4. Chronic cervical spine disease, status post multiple surgeries with chronic pain  5. Insomnia, late onset of sleep, and he sleeps until late in the morning  6. Tobacco abuse: Quit 10/13.  7. GERD  8. Chest pain: ETT-myoview (5/07): No ischemia  or infarction. ETT (3/11) normal. LHC (10/13): EF 60%, normal coronary arteries. Admit with CP in 2/14, troponin 4.4. LHC (2/14) with normal coronaries. Possible coronary vasospasm, improved with calcium channel blockers.  9. Echo (10/13) with EF 55-60%, normal RV, normal valves.  10. Ground glass lung infiltrates: Admitted with pleuritic chest pain in 10/13. CTA chest showed multiple areas of ground glass opacity. Transbronchial biopsy unrevealing. Improved with steroids. Ddx =  infection versus eosinophilic PNA versus organizing PNA versus pneumoconiosis.  11. Palpitations 12. Hyperlipidemia 13. Polyarthralgia of uncertain etiology: Negative workup by rheumatology.  14. CPX (9/14): peak VO2 25.6 (76% predicted), VE/VCO2 31.2, RER 1.28, dyspnea thought due to deconditioning.   Comes in today. Here with   Past Medical History:  Diagnosis Date  . Abnormal chest CT 03/2012   a. 2013: Scattered patchy ground glass opacities and pulmonary nodules. Transbronchial biopsy with no definite etiology of lung finding- possible organizing pneumonia. b. F/u CT 05/2014: stable multiple tiny pulm nodules, no further w/u recommended per notes.   . Anxiety   . Arthritis   . Chronic midline posterior neck pain   . Complication of anesthesia    " difficult to urinate after "  . Depression   . Esophageal stricture   . Fibromyalgia   . Fracture    back  . GERD (gastroesophageal reflux disease)   . Migraine headache    "none lately; I've had alot in my life" (06/21/2015)  . NSTEMI (non-ST elevated myocardial infarction) ([facility]) 07/2012   a. Normal cath 03/2012. b. 07/2012: troponin 4, normal cors, ?vasospasm. Did not tolerate Imdur due to headache. On amlodipine.  . Testosterone deficiency   . Tobacco abuse     Past Surgical History:  Procedure Laterality Date  . ANKLE FUSION Left 2006  . BACK SURGERY    . CERVICAL FUSION  X7 "last one 04/2011"  . COLONOSCOPY    . CYSTECTOMY    . FRACTURE SURGERY    . LEFT HEART CATHETERIZATION WITH CORONARY ANGIOGRAM N/A 03/25/2012   Procedure: LEFT HEART CATHETERIZATION WITH CORONARY ANGIOGRAM;  Surgeon: Burnell Blanks, MD;  Location: [facility] CATH LAB;  Service: Cardiovascular;  Laterality: N/A;  . LEFT HEART CATHETERIZATION WITH CORONARY ANGIOGRAM N/A 08/11/2012   Procedure: LEFT HEART CATHETERIZATION WITH CORONARY ANGIOGRAM;  Surgeon: Peter M Martinique, MD;  Location: [facility] CATH LAB;  Service: Cardiovascular;  Laterality: N/A;  .  POSTERIOR CERVICAL FUSION/FORAMINOTOMY  05/09/2011   Procedure: POSTERIOR CERVICAL FUSION/FORAMINOTOMY LEVEL 1;  Surgeon: Hosie Spangle;  Location: [facility];  Service: Neurosurgery;  Laterality: N/A;  C4/5 posterior arthrodesis with instrumentation   . UPPER GASTROINTESTINAL ENDOSCOPY    . VIDEO BRONCHOSCOPY  05/06/2012   Procedure: VIDEO BRONCHOSCOPY WITH FLUORO;  Surgeon: Jyl Heinz, MD;  Location: [facility] ENDOSCOPY;  Service: Cardiopulmonary;  Laterality: Bilateral;     Medications: Current Outpatient Prescriptions  Medication Sig Dispense Refill  . albuterol (PROVENTIL HFA;VENTOLIN HFA) 108 (90 Base) MCG/ACT inhaler Inhale 2 puffs into the lungs every 6 (six) hours as needed for wheezing or shortness of breath. 1 Inhaler 6  . aspirin EC 81 MG EC tablet Take 1 tablet (81 mg total) by mouth daily.    . diphenhydrAMINE (BENADRYL) 25 mg capsule Take 1 capsule (25 mg total) by mouth every 6 (six) hours as needed for allergies (throat swelling). (Patient not taking: Reported on 07/23/2016) 15 capsule 0  . esomeprazole (NEXIUM) 40 MG capsule Take 1 capsule (40 mg total) by mouth 2 (two) times  daily before a meal. 180 capsule 3  . FLUoxetine (PROZAC) 20 MG capsule Take 20 mg by mouth 2 (two) times daily.     Marland Kitchen lamoTRIgine (LAMICTAL) 100 MG tablet Take 100 mg by mouth 2 (two) times daily.    . nicotine (NICODERM CQ) 21 mg/24hr patch Place 1 patch (21 mg total) onto the skin daily. (Patient not taking: Reported on 07/23/2016) 28 patch 2  . nitroGLYCERIN (NITROSTAT) 0.4 MG SL tablet DISSOLVE ONE TABLET UNDER THE TONGUE EVERY 5 MINUTES AS NEEDED FOR CHEST PAIN.  DO NOT EXCEED A TOTAL OF 3 DOSES IN 15 MINUTES 25 tablet 6  . ondansetron (ZOFRAN) 4 MG tablet Take 1 tablet (4 mg total) by mouth as needed for nausea or vomiting (once daily as needed). 30 tablet 3  . Polyvinyl Alcohol-Povidone (REFRESH OP) Place 1 drop into both eyes daily as needed (dry eyes/ irritation).    . risperiDONE (RISPERDAL) 2  MG tablet Take 2 mg by mouth at bedtime.     No current facility-administered medications for this visit.     Allergies: Allergies  Allergen Reactions  . Midodrine Hcl Swelling    Tongue swelling  . Sulfa Antibiotics Rash  . Sulfonamide Derivatives Rash         Social History: The patient  reports that he quit smoking about 6 weeks ago. His smoking use included Cigarettes. He has a 27.50 pack-year smoking history. He has quit using smokeless tobacco. His smokeless tobacco use included Chew. He reports that he does not drink alcohol or use drugs.   Family History: The patient's ***family history includes Asthma in his sister; Coronary artery disease in his paternal uncle; Diabetes in his mother.   Review of Systems: Please see the history of present illness.   Otherwise, the review of systems is positive for {NONE DEFAULTED:18576::"none"}.   All other systems are reviewed and negative.   Physical Exam: VS:  There were no vitals taken for this visit. Marland Kitchen  BMI There is no height or weight on file to calculate BMI.  Wt Readings from Last 3 Encounters:  07/04/16 187 lb 1.6 oz (84.9 kg)  07/02/16 193 lb (87.5 kg)  04/25/16 187 lb 3.2 oz (84.9 kg)    General: Pleasant. Well developed, well nourished and in no acute distress.   HEENT: Normal.  Neck: Supple, no JVD, carotid bruits, or masses noted.  Cardiac: ***Regular rate and rhythm. No murmurs, rubs, or gallops. No edema.  Respiratory:  Lungs are clear to auscultation bilaterally with normal work of breathing.  GI: Soft and nontender.  MS: No deformity or atrophy. Gait and ROM intact.  Skin: Warm and dry. Color is normal.  Neuro:  Strength and sensation are intact and no gross focal deficits noted.  Psych: Alert, appropriate and with normal affect.   LABORATORY DATA:  EKG:  EKG {ACTION; IS/IS [id number] ordered today. This demonstrates ***.  Lab Results  Component Value Date   WBC 6.9 07/04/2016   HGB 13.8 07/04/2016    HCT 40.1 07/04/2016   PLT 173 07/04/2016   GLUCOSE 106 (H) 07/04/2016   CHOL 203 (H) 07/05/2016   TRIG 134 07/05/2016   HDL 54 07/05/2016   LDLCALC 122 (H) 07/05/2016   ALT 19 07/04/2016   AST 19 07/04/2016   NA 133 (L) 07/04/2016   K 3.7 07/04/2016   CL 102 07/04/2016   CREATININE 1.21 07/04/2016   BUN 14 07/04/2016   CO2 22 07/04/2016   TSH 1.30 10/13/2015  INR 0.99 08/10/2012   HGBA1C 5.2 07/05/2016    BNP (last 3 results)  Recent Labs  07/04/16 1423  BNP 5.0    ProBNP (last 3 results) No results for input(s): PROBNP in the last 8760 hours.   Other Studies Reviewed Today:  CPX Conclusion from 02/2013: Exercise testing with gas exchange demonstrates a mild functional limitation when compared to matched sedentary norms. The patient's tidal volume during exercise appears to be a limiting factor, couple with the low respiratory rate. The elevated tidal volume during exercise can induce a sensation of not being able to get a full breath and may cause chest discomfort. There also appears to be significant deconditioning. ^^^Preliminary CPX Results, Finalized results will be forwarded when completed by interpreting physician.^^^  Prepared by: Linzie Collin, MEd, ACSM-RCEP Sr. Exercise Physiologist 02/23/2013 2:52 PM  Echo Study Conclusions from 03/2012  - Left ventricle: The cavity size was normal. Wall thickness was normal. Systolic function was normal. The estimated ejection fraction was in the range of 55% to 60%. Wall motion was normal; there were no regional wall motion abnormalities. Doppler parameters are consistent with abnormal left ventricular relaxation (grade 1 diastolic dysfunction). - Aortic valve: There was no stenosis. - Mitral valve: No significant regurgitation. - Right ventricle: The cavity size was normal. Systolic function was normal. - Pulmonary arteries: No complete TR doppler jet so unable to estimate PA systolic  pressure. - Inferior vena cava: The vessel was normal in size; the respirophasic diameter changes were in the normal range (= 50%); findings are consistent with normal central venous pressure. - Pericardium, extracardiac: There was no pericardial effusion. Impressions:  - Normal LV size and systolic function, EF 0000000. Normal RV size and systolic function. No significant valvular abnormalities. No pericardial effusion.  Coronary angiography from 07/2012: Coronary dominance: left  Left mainstem: Short, normal.  Left anterior descending (LAD): Normal.  Left circumflex (LCx): Dominant, normal.  Right coronary artery (RCA): small, nondominant, normal.  Left ventriculography: Left ventricular systolic function is normal, LVEF is estimated at 55-65%, there is no significant mitral regurgitation   Final Conclusions:  1. Normal coronary anatomy. 2. Normal LV function.  Recommendations: treat empirically for spasm.  [facility] 08/11/2012, 1:52 PM  Assessment/Plan: 1. Coronary vasospasm: needs to really work on risk factors. His current chest pain seems more pleurisy like. Will get him back to see pulmonary.   2. Palpitations:  Not really an issue at this time. HR fine today on EKG  3. History of ground glass infiltrates: Undifferentiated lung disease. will get him back to pulmonary - Patricia Nettle, NP can see tomorrow. Defer Ct scan to pulmonary.   4. Fatigue: This is chronic. Still suspect deconditioning (supported by CPX in 9/14). Suggested again that he start exercising, would like to see him walk 4-5 times/week.  5. HLD - recheck lipids today.   6. Tobacco abuse - has stopped since Sunday - he has stopped cold [place] before in the past. Will let him have RX for patches if needed.   Current medicines are reviewed with the patient today.  The patient does not have concerns regarding medicines other than what has been noted  above.  The following changes have been made:  See above.  Labs/ tests ordered today include:   No orders of the defined types were placed in this encounter.    Disposition:   FU with *** in {gen number [id number] {Days to years:10300}.   Patient is agreeable to this plan  and will call if any problems develop in the interim.   SignedTruitt Merle, NP  08/19/2016 8:53 AM  [facility] [address] [place], [place]  [address] Phone: [phone] Fax: [phone]

## 2016-08-21 ENCOUNTER — Encounter: Payer: Self-pay | Admitting: Nurse Practitioner

## 2016-08-28 DIAGNOSIS — N3281 Overactive bladder: Secondary | ICD-10-CM | POA: Diagnosis not present

## 2016-10-01 DIAGNOSIS — I10 Essential (primary) hypertension: Secondary | ICD-10-CM | POA: Diagnosis not present

## 2016-10-01 DIAGNOSIS — R5383 Other fatigue: Secondary | ICD-10-CM | POA: Diagnosis not present

## 2016-10-01 DIAGNOSIS — R110 Nausea: Secondary | ICD-10-CM | POA: Diagnosis not present

## 2016-10-07 ENCOUNTER — Ambulatory Visit: Payer: Medicare Other | Admitting: Internal Medicine

## 2016-10-09 DIAGNOSIS — R310 Gross hematuria: Secondary | ICD-10-CM | POA: Diagnosis not present

## 2016-10-09 DIAGNOSIS — I10 Essential (primary) hypertension: Secondary | ICD-10-CM | POA: Diagnosis not present

## 2016-10-09 DIAGNOSIS — I2510 Atherosclerotic heart disease of native coronary artery without angina pectoris: Secondary | ICD-10-CM | POA: Diagnosis not present

## 2016-10-09 DIAGNOSIS — R1013 Epigastric pain: Secondary | ICD-10-CM | POA: Diagnosis not present

## 2016-10-09 DIAGNOSIS — R5383 Other fatigue: Secondary | ICD-10-CM | POA: Diagnosis not present

## 2016-10-09 DIAGNOSIS — R110 Nausea: Secondary | ICD-10-CM | POA: Diagnosis not present

## 2016-11-08 DIAGNOSIS — M7701 Medial epicondylitis, right elbow: Secondary | ICD-10-CM | POA: Diagnosis not present

## 2016-11-19 DIAGNOSIS — M5416 Radiculopathy, lumbar region: Secondary | ICD-10-CM | POA: Diagnosis not present

## 2016-11-19 DIAGNOSIS — M546 Pain in thoracic spine: Secondary | ICD-10-CM | POA: Diagnosis not present

## 2016-11-19 DIAGNOSIS — G8929 Other chronic pain: Secondary | ICD-10-CM | POA: Diagnosis not present

## 2016-11-19 DIAGNOSIS — M545 Low back pain: Secondary | ICD-10-CM | POA: Diagnosis not present

## 2016-11-19 DIAGNOSIS — Z6828 Body mass index (BMI) 28.0-28.9, adult: Secondary | ICD-10-CM | POA: Diagnosis not present

## 2016-11-19 DIAGNOSIS — N5201 Erectile dysfunction due to arterial insufficiency: Secondary | ICD-10-CM | POA: Diagnosis not present

## 2017-03-03 DIAGNOSIS — E782 Mixed hyperlipidemia: Secondary | ICD-10-CM | POA: Diagnosis not present

## 2017-03-03 DIAGNOSIS — Z1211 Encounter for screening for malignant neoplasm of colon: Secondary | ICD-10-CM | POA: Diagnosis not present

## 2017-03-03 DIAGNOSIS — I2510 Atherosclerotic heart disease of native coronary artery without angina pectoris: Secondary | ICD-10-CM | POA: Diagnosis not present

## 2017-03-03 DIAGNOSIS — Z0000 Encounter for general adult medical examination without abnormal findings: Secondary | ICD-10-CM | POA: Diagnosis not present

## 2017-03-03 DIAGNOSIS — I10 Essential (primary) hypertension: Secondary | ICD-10-CM | POA: Diagnosis not present

## 2017-03-03 DIAGNOSIS — K2970 Gastritis, unspecified, without bleeding: Secondary | ICD-10-CM | POA: Diagnosis not present

## 2017-03-03 DIAGNOSIS — Z23 Encounter for immunization: Secondary | ICD-10-CM | POA: Diagnosis not present

## 2017-03-22 ENCOUNTER — Other Ambulatory Visit: Payer: Self-pay | Admitting: Gastroenterology

## 2017-05-06 ENCOUNTER — Ambulatory Visit: Payer: Medicare Other | Admitting: Gastroenterology

## 2017-05-13 DIAGNOSIS — M5481 Occipital neuralgia: Secondary | ICD-10-CM | POA: Diagnosis not present

## 2017-05-13 DIAGNOSIS — M542 Cervicalgia: Secondary | ICD-10-CM | POA: Diagnosis not present

## 2017-06-25 DIAGNOSIS — M546 Pain in thoracic spine: Secondary | ICD-10-CM | POA: Diagnosis not present

## 2017-06-25 DIAGNOSIS — M5416 Radiculopathy, lumbar region: Secondary | ICD-10-CM | POA: Diagnosis not present

## 2017-06-25 DIAGNOSIS — R51 Headache: Secondary | ICD-10-CM | POA: Diagnosis not present

## 2017-06-25 DIAGNOSIS — M4722 Other spondylosis with radiculopathy, cervical region: Secondary | ICD-10-CM | POA: Diagnosis not present

## 2017-06-25 DIAGNOSIS — M542 Cervicalgia: Secondary | ICD-10-CM | POA: Diagnosis not present

## 2017-06-26 ENCOUNTER — Other Ambulatory Visit: Payer: Self-pay | Admitting: Neurosurgery

## 2017-06-26 DIAGNOSIS — M4722 Other spondylosis with radiculopathy, cervical region: Secondary | ICD-10-CM

## 2017-07-01 ENCOUNTER — Emergency Department (HOSPITAL_COMMUNITY): Payer: Medicare Other

## 2017-07-01 ENCOUNTER — Encounter (HOSPITAL_COMMUNITY): Payer: Self-pay | Admitting: Emergency Medicine

## 2017-07-01 ENCOUNTER — Emergency Department (HOSPITAL_COMMUNITY)
Admission: EM | Admit: 2017-07-01 | Discharge: 2017-07-01 | Disposition: A | Discharge status: Home / Self Care | Payer: Medicare Other | Attending: Emergency Medicine | Admitting: Emergency Medicine

## 2017-07-01 DIAGNOSIS — Z79899 Other long term (current) drug therapy: Secondary | ICD-10-CM | POA: Insufficient documentation

## 2017-07-01 DIAGNOSIS — I10 Essential (primary) hypertension: Secondary | ICD-10-CM | POA: Diagnosis not present

## 2017-07-01 DIAGNOSIS — R072 Precordial pain: Secondary | ICD-10-CM | POA: Insufficient documentation

## 2017-07-01 DIAGNOSIS — R05 Cough: Secondary | ICD-10-CM | POA: Diagnosis not present

## 2017-07-01 DIAGNOSIS — Z87891 Personal history of nicotine dependence: Secondary | ICD-10-CM | POA: Insufficient documentation

## 2017-07-01 DIAGNOSIS — Z7982 Long term (current) use of aspirin: Secondary | ICD-10-CM | POA: Insufficient documentation

## 2017-07-01 DIAGNOSIS — R079 Chest pain, unspecified: Secondary | ICD-10-CM | POA: Diagnosis present

## 2017-07-01 LAB — BASIC METABOLIC PANEL
Anion gap: 10 (ref 5–15)
BUN: 17 mg/dL (ref 6–20)
CO2: 25 mmol/L (ref 22–32)
Calcium: 9.9 mg/dL (ref 8.9–10.3)
Chloride: 101 mmol/L (ref 101–111)
Creatinine, Ser: 1.33 mg/dL — ABNORMAL HIGH (ref 0.61–1.24)
GFR calc Af Amer: >60 mL/min (ref >60)
GFR calc non Af Amer: >60 mL/min (ref >60)
Glucose, Bld: 92 mg/dL (ref 65–99)
Potassium: 4.5 mmol/L (ref 3.5–5.1)
Sodium: 136 mmol/L (ref 135–145)

## 2017-07-01 LAB — I-STAT TROPONIN, ED
Troponin i, poc: 0 ng/mL (ref 0.00–0.08)
Troponin i, poc: 0 ng/mL (ref 0.00–0.08)

## 2017-07-01 LAB — D-DIMER, QUANTITATIVE (NOT AT ARMC): D-Dimer, Quant: 0.62 ug/mL-FEU — ABNORMAL HIGH (ref 0.00–0.50)

## 2017-07-01 LAB — CBC
HCT: 41.8 % (ref 39.0–52.0)
Hemoglobin: 14.2 g/dL (ref 13.0–17.0)
MCH: 29.8 pg (ref 26.0–34.0)
MCHC: 34 g/dL (ref 30.0–36.0)
MCV: 87.8 fL (ref 78.0–100.0)
Platelets: 186 10*3/uL (ref 150–400)
RBC: 4.76 MIL/uL (ref 4.22–5.81)
RDW: 12.5 % (ref 11.5–15.5)
WBC: 6.5 10*3/uL (ref 4.0–10.5)

## 2017-07-01 LAB — RAPID URINE DRUG SCREEN, HOSP PERFORMED
Amphetamines: NOT DETECTED
Barbiturates: NOT DETECTED
Benzodiazepines: NOT DETECTED
Cocaine: NOT DETECTED
Opiates: POSITIVE — AB
Tetrahydrocannabinol: NOT DETECTED

## 2017-07-01 MED ORDER — ASPIRIN 81 MG PO CHEW
243.0000 mg | CHEWABLE_TABLET | Freq: Once | ORAL | Status: AC
  Administered 2017-07-01: 243 mg via ORAL
  Filled 2017-07-01: qty 3

## 2017-07-01 MED ORDER — MORPHINE SULFATE (PF) 4 MG/ML IV SOLN
4.0000 mg | Freq: Once | INTRAVENOUS | Status: AC
  Administered 2017-07-01: 4 mg via INTRAVENOUS
  Filled 2017-07-01: qty 1

## 2017-07-01 MED ORDER — HYDROCODONE-ACETAMINOPHEN 5-325 MG PO TABS: 1.0000 | ORAL_TABLET | ORAL | 0 refills | Status: DC | PRN

## 2017-07-01 MED ORDER — KETOROLAC TROMETHAMINE 30 MG/ML IJ SOLN
30.0000 mg | Freq: Once | INTRAMUSCULAR | Status: AC
  Administered 2017-07-01: 30 mg via INTRAVENOUS
  Filled 2017-07-01: qty 1

## 2017-07-01 MED ORDER — IOPAMIDOL (ISOVUE-370) INJECTION 76%
INTRAVENOUS | Status: AC
  Administered 2017-07-01: 65 mL via INTRAVENOUS
  Filled 2017-07-01: qty 100

## 2017-07-01 NOTE — Discharge Instructions (Signed)
Your workup today showed no evidence of blood clot.  Your cardiac enzymes were negative twice.  Based on the reassuring results and improvement in your pain with medications, but the emergency team felt you are safe for discharge home.  Please follow-up with up with your PCP and/or GI team for reevaluation and further management.  If any symptoms change or worsen, please return to the nearest emergency department.

## 2017-07-01 NOTE — ED Provider Notes (Signed)
7:57 AM Care assumed from Dr. Roxanne Mins.  At time of transfer of care, patient is awaiting CT PE study to look for pulmonary embolism as source of patient's symptoms.  Patient found to have elevated d-dimer overnight and PE study was ordered.  According to previous team, if patient's PE study is negative, patient will be appropriate for discharge home.  While awaiting results, patient required another dose of pain medication.  He reports the pain medicines have helped him.  Next  CT imaging returned showing no evidence of pulmonary embolism.  Nodules and chest appeared unchanged from prior.  There was note of thickening of his stomach wall.  In the setting of the patient's pain and history of reflux, this may be the etiology of his discomfort.  As patient has responded well to pain medications, he will be given a prescription for several doses of pain medication.  He will also be instructed to follow-up with his PCP in several days for reevaluation as well as his gastroneurology team to discuss changes in his GI regimen.  He reports that he will continue to take his reflux medications and rest his bowels in regards to spicy food or acidic foods.  Patient also had a delta troponin that was negative.  Other laboratory testing reassuring.    Patient and family understand return precautions and follow-up instructions.  Patient had no other questions or concerns and patient was discharged in good condition.     Clinical Impression: 1. Precordial pain     Disposition: Discharge  Condition: Good  I have discussed the results, Dx and Tx plan with the pt(& family if present). He/she/they expressed understanding and agree(s) with the plan. Discharge instructions discussed at great length. Strict return precautions discussed and pt &/or family have verbalized understanding of the instructions. No further questions at time of discharge.    This SmartLink is deprecated. Use AVSMEDLIST instead to  display the medication list for a patient.  Follow Up: Starlyn Skeans, PA-C [place] [address] [phone]     [facility] [address] [id number] [place] [phone]  If symptoms worsen     [address], Gwenyth Allegra, MD 07/01/17 2030

## 2017-07-01 NOTE — ED Provider Notes (Signed)
[facility] EMERGENCY DEPARTMENT Provider Note   CSN: [id number] Arrival date & time: 07/01/17  0210     History   Chief Complaint Chief Complaint  Patient presents with  . Chest Pain    HPI Richard Franco is a 52 y.o. male.  The history is provided by the patient.  He has history of myocardial infarction with normal catheterization and felt to be due to vasospasm and comes in with sharp chest pain which started at midnight.  There is no associated dyspnea, nausea, diaphoresis.  Pain is worse when he takes a deep breath.  He rates pain at 8/10.  He has taken nitroglycerin at home without any benefit.  He took his normal dose of aspirin 81 mg.  Pain does not seem to be affected by body position or exertion.  Onset was at rest.  He does smoke cigarettes, about 1/4 pack of cigarettes a day.  There is no history of diabetes, hypertension, hyperlipidemia and no family history of premature coronary atherosclerosis.  He denies any drug use.  He did have a long distance car ride within the last 6 weeks.  Past Medical History:  Diagnosis Date  . Abnormal chest CT 03/2012   a. 2013: Scattered patchy ground glass opacities and pulmonary nodules. Transbronchial biopsy with no definite etiology of lung finding- possible organizing pneumonia. b. F/u CT 05/2014: stable multiple tiny pulm nodules, no further w/u recommended per notes.   . Anxiety   . Arthritis   . Chronic midline posterior neck pain   . Complication of anesthesia    " difficult to urinate after "  . Depression   . Esophageal stricture   . Fibromyalgia   . Fracture    back  . GERD (gastroesophageal reflux disease)   . Migraine headache    "none lately; I've had alot in my life" (06/21/2015)  . NSTEMI (non-ST elevated myocardial infarction) ([place]) 07/2012   a. Normal cath 03/2012. b. 07/2012: troponin 4, normal cors, ?vasospasm. Did not tolerate Imdur due to headache. On amlodipine.  . Testosterone deficiency     . Tobacco abuse     Patient Active Problem List   Diagnosis Date Noted  . Throat swelling 07/06/2016  . Cocaine use 07/06/2016  . ETOH abuse 07/06/2016  . Dyspnea on exertion 07/04/2016  . Chest pain 07/04/2016  . Essential hypertension 07/04/2016  . AKI (acute kidney injury) ([place]) 07/04/2016  . Chest pain, atypical 11/16/2015  . Pleuritic chest pain 06/22/2015  . Tobacco abuse 06/22/2015  . History of coronary vasospasm 06/22/2015  . Smoking history 07/19/2014  . Chronic fatigue 07/19/2014  . Chronic arthralgias of knees and hips 11/26/2012  . Hyperlipidemia 09/25/2012  . GERD (gastroesophageal reflux disease) 08/11/2012  . BPH (benign prostatic hyperplasia) 08/11/2012  . Abnormal immunological finding in serum 05/03/2012  . Pulmonary infiltrates 04/16/2012  . Pulmonary nodule 03/25/2012  . Abnormal CT lung screening 03/25/2012  . History of tobacco abuse 02/18/2012  . Fibromyalgia 02/18/2012  . Hx of migraine headaches 02/18/2012  . DYSPNEA 08/24/2009  . CHEST PAIN-UNSPECIFIED 08/24/2009  . ABFND, FALSE POSITIVE SEROLOGIC TEST, [facility] 01/12/2007  . OSTEOARTHRITIS 01/05/2007    Past Surgical History:  Procedure Laterality Date  . ANKLE FUSION Left 2006  . BACK SURGERY    . CERVICAL FUSION  X7 "last one 04/2011"  . COLONOSCOPY    . CYSTECTOMY    . FRACTURE SURGERY    . LEFT HEART CATHETERIZATION WITH CORONARY ANGIOGRAM N/A 03/25/2012  Procedure: LEFT HEART CATHETERIZATION WITH CORONARY ANGIOGRAM;  Surgeon: Burnell Blanks, MD;  Location: [facility] CATH LAB;  Service: Cardiovascular;  Laterality: N/A;  . LEFT HEART CATHETERIZATION WITH CORONARY ANGIOGRAM N/A 08/11/2012   Procedure: LEFT HEART CATHETERIZATION WITH CORONARY ANGIOGRAM;  Surgeon: Peter M Martinique, MD;  Location: [facility] CATH LAB;  Service: Cardiovascular;  Laterality: N/A;  . POSTERIOR CERVICAL FUSION/FORAMINOTOMY  05/09/2011   Procedure: POSTERIOR CERVICAL FUSION/FORAMINOTOMY LEVEL 1;  Surgeon: Hosie Spangle;   Location: [place] NEURO ORS;  Service: Neurosurgery;  Laterality: N/A;  C4/5 posterior arthrodesis with instrumentation   . UPPER GASTROINTESTINAL ENDOSCOPY    . VIDEO BRONCHOSCOPY  05/06/2012   Procedure: VIDEO BRONCHOSCOPY WITH FLUORO;  Surgeon: Jyl Heinz, MD;  Location: [facility] ENDOSCOPY;  Service: Cardiopulmonary;  Laterality: Bilateral;       Home Medications    Prior to Admission medications   Medication Sig Start Date End Date Taking? Authorizing Provider  albuterol (PROVENTIL HFA;VENTOLIN HFA) 108 (90 Base) MCG/ACT inhaler Inhale 2 puffs into the lungs every 6 (six) hours as needed for wheezing or shortness of breath. 04/25/16   Parrett, Fonnie Mu, NP  amLODipine (NORVASC) 5 MG tablet Take 5 mg by mouth 2 (two) times daily.    [provider]  aspirin EC 81 MG EC tablet Take 1 tablet (81 mg total) by mouth daily. 08/12/12   Dunn, Nedra Hai, PA-C  baclofen (LIORESAL) 10 MG tablet Take 10 mg by mouth 3 (three) times daily as needed for muscle spasms.    [provider]  esomeprazole (NEXIUM) 40 MG capsule TAKE ONE CAPSULE BY MOUTH TWICE A DAY BEFORE A MEAL. 03/24/17   Mauri Pole, MD  FLUoxetine (PROZAC) 20 MG capsule Take 20 mg by mouth 2 (two) times daily.     [provider]  HYDROcodone-acetaminophen (NORCO/VICODIN) 5-325 MG tablet Take 1 tablet by mouth every 6 (six) hours as needed for moderate pain.    [provider]  lamoTRIgine (LAMICTAL) 100 MG tablet Take 100 mg by mouth 2 (two) times daily.    [provider]  nitroGLYCERIN (NITROSTAT) 0.4 MG SL tablet DISSOLVE ONE TABLET UNDER THE TONGUE EVERY 5 MINUTES AS NEEDED FOR CHEST PAIN.  DO NOT EXCEED A TOTAL OF 3 DOSES IN 15 MINUTES 10/09/15   Larey Dresser, MD  promethazine (PHENERGAN) 25 MG tablet Take 25 mg by mouth every 6 (six) hours as needed for nausea or vomiting.    [provider]  rosuvastatin (CRESTOR) 10 MG tablet Take 10 mg by mouth daily.    [provider]     Family History Family History  Problem Relation Age of Onset  . Diabetes Mother   . Coronary artery disease Paternal Uncle   . Asthma Sister   . Colon cancer Neg Hx   . Colon polyps Neg Hx   . Rectal cancer Neg Hx   . Stomach cancer Neg Hx     Social History Social History   Tobacco Use  . Smoking status: Former Smoker    Packs/day: 1.00    Years: 27.50    Pack years: 27.50    Types: Cigarettes    Last attempt to quit: 07/04/2016    Years since quitting: 0.9  . Smokeless tobacco: Former Systems developer    Types: Chew  Substance Use Topics  . Alcohol use: No    Alcohol/week: 12.6 oz    Types: 21 Cans of beer per week    Comment: 06/21/2015 "3 beers/day  lately; more lately cause of being separated"   . Drug use: No     Allergies   Midodrine hcl; Sulfa antibiotics; and Sulfonamide derivatives   Review of Systems Review of Systems  All other systems reviewed and are negative.    Physical Exam Updated Vital Signs BP 122/83   Pulse 77   Temp 97.6 F (36.4 C)   Resp 10   Ht 5' 8.5" (1.74 m)   Wt 86.2 kg (190 lb)   SpO2 98%   BMI 28.47 kg/m   Physical Exam  Nursing note and vitals reviewed.  52 year old male, resting comfortably and in no acute distress. Vital signs are normal. Oxygen saturation is 98%, which is normal. Head is normocephalic and atraumatic. PERRLA, EOMI. Oropharynx is clear. Neck is nontender and supple without adenopathy or JVD. Back is nontender and there is no CVA tenderness. Lungs are clear without rales, wheezes, or rhonchi. Chest is nontender. Heart has regular rate and rhythm without murmur. Abdomen is soft, flat, nontender without masses or hepatosplenomegaly and peristalsis is normoactive. Extremities have no cyanosis or edema, full range of motion is present. Skin is warm and dry without rash. Neurologic: Mental status is normal, cranial nerves are intact, there are no motor or sensory deficits.   ED Treatments / Results   Labs (all labs ordered are listed, but only abnormal results are displayed) Labs Reviewed  BASIC METABOLIC PANEL - Abnormal; Notable for the following components:      Result Value   Creatinine, Ser 1.33 (*)    All other components within normal limits  CBC  I-STAT TROPONIN, ED  I-STAT TROPONIN, ED    EKG  EKG Interpretation  Date/Time:  Tuesday July 01 2017 02:13:34 EST Ventricular Rate:  103 PR Interval:  148 QRS Duration: 88 QT Interval:  348 QTC Calculation: 455 R Axis:   79 Text Interpretation:  Sinus tachycardia Otherwise normal ECG When compared with ECG of 1/12/20018, No significant change was found Confirmed by Delora Fuel ([id number]) on 07/01/2017 5:55:24 AM       Radiology Dg Chest 2 View  Result Date: 07/01/2017 CLINICAL DATA:  Midsternal chest pain with dyspnea and dry cough. EXAM: CHEST  2 VIEW COMPARISON:  07/04/2016 CXR FINDINGS: The heart size and mediastinal contours are within normal limits. Both lungs are clear. ACDF of the included lower cervical spine. The visualized skeletal structures are otherwise unremarkable. IMPRESSION: No active cardiopulmonary disease. Electronically Signed   By: Ashley Royalty M.D.   On: 07/01/2017 03:00    Procedures Procedures (including critical care time)  Medications Ordered in ED Medications  ketorolac (TORADOL) 30 MG/ML injection 30 mg (not administered)  aspirin chewable tablet 243 mg (not administered)     Initial Impression / Assessment and Plan / ED Course  I have reviewed the triage vital signs and the nursing notes.  Pertinent labs & imaging results that were available during my care of the patient were reviewed by me and considered in my medical decision making (see chart for details).  Chest pain of uncertain cause.  Old records are reviewed confirming history of an STEMI in 2014 with cardiac catheterization showing no significant coronary artery disease.  Hospitalization for chest pain 1 year ago was felt to  be cocaine related.  Patient adamantly denies any recent cocaine use.  However, will check urine drug screen.  He will also be given additional aspirin.  Will give trial of ketorolac and check d-dimer.  D-dimer has come  back elevated.  CT angiogram has been ordered.  There is minimal relief of pain with ketorolac and is given a dose of morphine.  Case is signed out to Dr. Sherry Ruffing to evaluate results of CT angiogram.  Final Clinical Impressions(s) / ED Diagnoses   Final diagnoses:  Precordial pain    ED Discharge Orders    None       Delora Fuel, MD 43/73/57 [id number]

## 2017-07-01 NOTE — ED Triage Notes (Signed)
Patient reports mid chest pain with mild SOB and dry cough , onset this evening , denies nausea or diaphoresis , pt. took 2 NTG sl with no relief.

## 2017-07-02 ENCOUNTER — Ambulatory Visit
Admission: RE | Admit: 2017-07-02 | Discharge: 2017-07-02 | Disposition: A | Discharge status: Home / Self Care | Payer: Medicare Other | Source: Ambulatory Visit | Attending: Neurosurgery | Admitting: Neurosurgery

## 2017-07-02 DIAGNOSIS — M4722 Other spondylosis with radiculopathy, cervical region: Secondary | ICD-10-CM

## 2017-07-02 DIAGNOSIS — M50323 Other cervical disc degeneration at C6-C7 level: Secondary | ICD-10-CM | POA: Diagnosis not present

## 2017-07-02 DIAGNOSIS — M5031 Other cervical disc degeneration,  high cervical region: Secondary | ICD-10-CM | POA: Diagnosis not present

## 2017-07-02 DIAGNOSIS — M50321 Other cervical disc degeneration at C4-C5 level: Secondary | ICD-10-CM | POA: Diagnosis not present

## 2017-07-02 MED ORDER — ONDANSETRON HCL 4 MG/2ML IJ SOLN
4.0000 mg | Freq: Once | INTRAMUSCULAR | Status: AC
  Administered 2017-07-02: 4 mg via INTRAMUSCULAR

## 2017-07-02 MED ORDER — DIAZEPAM 5 MG PO TABS
10.0000 mg | ORAL_TABLET | Freq: Once | ORAL | Status: AC
  Administered 2017-07-02: 10 mg via ORAL

## 2017-07-02 MED ORDER — MEPERIDINE HCL 100 MG/ML IJ SOLN
100.0000 mg | Freq: Once | INTRAMUSCULAR | Status: AC
  Administered 2017-07-02: 100 mg via INTRAMUSCULAR

## 2017-07-02 MED ORDER — IOPAMIDOL (ISOVUE-M 300) INJECTION 61%
10.0000 mL | Freq: Once | INTRAMUSCULAR | Status: AC | PRN
  Administered 2017-07-02: 10 mL via INTRATHECAL

## 2017-07-02 NOTE — Progress Notes (Signed)
Patient states he has been off Prozac and Phenergan for at least the past two days.

## 2017-07-02 NOTE — Discharge Instructions (Signed)
Myelogram Discharge Instructions  1. Go home and rest quietly for the next 24 hours.  It is important to lie flat for the next 24 hours.  Get up only to go to the restroom.  You may lie in the bed or on a couch on your back, your stomach, your left side or your right side.  You may have one pillow under your head.  You may have pillows between your knees while you are on your side or under your knees while you are on your back.  2. DO NOT drive today.  Recline the seat as far back as it will go, while still wearing your seat belt, on the way home.  3. You may get up to go to the bathroom as needed.  You may sit up for 10 minutes to eat.  You may resume your normal diet and medications unless otherwise indicated.  Drink plenty of extra fluids today and tomorrow.  4. The incidence of a spinal headache with nausea and/or vomiting is about 5% (one in 20 patients).  If you develop a headache, lie flat and drink plenty of fluids until the headache goes away.  Caffeinated beverages may be helpful.  If you develop severe nausea and vomiting or a headache that does not go away with flat bed rest, call [phone].  5. You may resume normal activities after your 24 hours of bed rest is over; however, do not exert yourself strongly or do any heavy lifting tomorrow.  6. Call your physician for a follow-up appointment.    You may resume Phenergan and Prozac on Thursday, July 03, 2017 after 10:30a.m.

## 2017-07-04 DIAGNOSIS — M4722 Other spondylosis with radiculopathy, cervical region: Secondary | ICD-10-CM | POA: Diagnosis not present

## 2017-07-04 DIAGNOSIS — Z6828 Body mass index (BMI) 28.0-28.9, adult: Secondary | ICD-10-CM | POA: Diagnosis not present

## 2017-07-04 DIAGNOSIS — M542 Cervicalgia: Secondary | ICD-10-CM | POA: Diagnosis not present

## 2017-07-04 DIAGNOSIS — R51 Headache: Secondary | ICD-10-CM | POA: Diagnosis not present

## 2017-07-04 DIAGNOSIS — I10 Essential (primary) hypertension: Secondary | ICD-10-CM | POA: Diagnosis not present

## 2017-07-08 ENCOUNTER — Other Ambulatory Visit: Payer: Medicare Other

## 2017-07-15 DIAGNOSIS — M5412 Radiculopathy, cervical region: Secondary | ICD-10-CM | POA: Diagnosis not present

## 2017-08-12 DIAGNOSIS — M5412 Radiculopathy, cervical region: Secondary | ICD-10-CM | POA: Diagnosis not present

## 2017-09-02 DIAGNOSIS — M5412 Radiculopathy, cervical region: Secondary | ICD-10-CM | POA: Diagnosis not present

## 2017-09-02 DIAGNOSIS — M47812 Spondylosis without myelopathy or radiculopathy, cervical region: Secondary | ICD-10-CM | POA: Diagnosis not present

## 2017-09-03 DIAGNOSIS — F411 Generalized anxiety disorder: Secondary | ICD-10-CM | POA: Diagnosis not present

## 2017-09-03 DIAGNOSIS — F902 Attention-deficit hyperactivity disorder, combined type: Secondary | ICD-10-CM | POA: Diagnosis not present

## 2017-09-09 DIAGNOSIS — R030 Elevated blood-pressure reading, without diagnosis of hypertension: Secondary | ICD-10-CM | POA: Diagnosis not present

## 2017-09-09 DIAGNOSIS — M4722 Other spondylosis with radiculopathy, cervical region: Secondary | ICD-10-CM | POA: Diagnosis not present

## 2017-09-09 DIAGNOSIS — Z6828 Body mass index (BMI) 28.0-28.9, adult: Secondary | ICD-10-CM | POA: Diagnosis not present

## 2017-09-09 DIAGNOSIS — R51 Headache: Secondary | ICD-10-CM | POA: Diagnosis not present

## 2017-09-09 DIAGNOSIS — M542 Cervicalgia: Secondary | ICD-10-CM | POA: Diagnosis not present

## 2017-09-16 DIAGNOSIS — G44219 Episodic tension-type headache, not intractable: Secondary | ICD-10-CM | POA: Diagnosis not present

## 2017-09-16 DIAGNOSIS — H40033 Anatomical narrow angle, bilateral: Secondary | ICD-10-CM | POA: Diagnosis not present

## 2017-09-23 DIAGNOSIS — M4722 Other spondylosis with radiculopathy, cervical region: Secondary | ICD-10-CM | POA: Diagnosis not present

## 2017-09-23 DIAGNOSIS — M542 Cervicalgia: Secondary | ICD-10-CM | POA: Diagnosis not present

## 2017-09-23 DIAGNOSIS — I10 Essential (primary) hypertension: Secondary | ICD-10-CM | POA: Diagnosis not present

## 2017-09-23 DIAGNOSIS — R51 Headache: Secondary | ICD-10-CM | POA: Diagnosis not present

## 2017-09-23 DIAGNOSIS — Z6828 Body mass index (BMI) 28.0-28.9, adult: Secondary | ICD-10-CM | POA: Diagnosis not present

## 2017-10-09 ENCOUNTER — Telehealth: Payer: Self-pay | Admitting: Nurse Practitioner

## 2017-10-09 DIAGNOSIS — W57XXXA Bitten or stung by nonvenomous insect and other nonvenomous arthropods, initial encounter: Secondary | ICD-10-CM | POA: Diagnosis not present

## 2017-10-09 DIAGNOSIS — R079 Chest pain, unspecified: Secondary | ICD-10-CM | POA: Diagnosis not present

## 2017-10-09 DIAGNOSIS — I25110 Atherosclerotic heart disease of native coronary artery with unstable angina pectoris: Secondary | ICD-10-CM | POA: Diagnosis not present

## 2017-10-09 DIAGNOSIS — I2510 Atherosclerotic heart disease of native coronary artery without angina pectoris: Secondary | ICD-10-CM | POA: Diagnosis not present

## 2017-10-09 DIAGNOSIS — S30861A Insect bite (nonvenomous) of abdominal wall, initial encounter: Secondary | ICD-10-CM | POA: Diagnosis not present

## 2017-10-09 DIAGNOSIS — R197 Diarrhea, unspecified: Secondary | ICD-10-CM | POA: Diagnosis not present

## 2017-10-09 NOTE — Telephone Encounter (Signed)
New Message:     Pt is calling and states he is feeling very fatigue. Pt states he went to his PCP and they told him to call us.

## 2017-10-10 NOTE — Telephone Encounter (Signed)
Left message for patient to call back  

## 2017-10-10 NOTE — Telephone Encounter (Signed)
Called patient back. Patient stated he has just been feeling really fatigued and was advised to see his cardiologist. Made patient the first available appointment with Dr. Purcell Nails. Patient verbalized understanding and will call back if his symptoms become worse.

## 2017-10-10 NOTE — Telephone Encounter (Signed)
Follow up    Patient returning call. Please call to discuss.

## 2017-10-16 ENCOUNTER — Encounter: Payer: Self-pay | Admitting: Adult Health

## 2017-10-16 ENCOUNTER — Ambulatory Visit (INDEPENDENT_AMBULATORY_CARE_PROVIDER_SITE_OTHER): Payer: Medicare Other | Admitting: Adult Health

## 2017-10-16 VITALS — BP 102/80 | HR 83 | Ht 69.0 in | Wt 193.4 lb

## 2017-10-16 DIAGNOSIS — R0789 Other chest pain: Secondary | ICD-10-CM | POA: Diagnosis not present

## 2017-10-16 DIAGNOSIS — I10 Essential (primary) hypertension: Secondary | ICD-10-CM | POA: Diagnosis not present

## 2017-10-16 DIAGNOSIS — E7800 Pure hypercholesterolemia, unspecified: Secondary | ICD-10-CM

## 2017-10-16 DIAGNOSIS — M797 Fibromyalgia: Secondary | ICD-10-CM | POA: Diagnosis not present

## 2017-10-16 NOTE — Patient Instructions (Signed)
Medication Instructions:  NO CHANGES- Your physician recommends that you continue on your current medications as directed. Please refer to the Current Medication list given to you today.  If you need a refill on your cardiac medications before your next appointment, please call your pharmacy.  Special Instructions: MAKE SURE TO FOLLOW UP WITH YOUR PRIMARY MD TO DISCUSS MUSCLE WEAKNESS AND FATIGUE  Follow-Up: Your physician wants you to follow-up in: Richard Franco should receive a reminder letter in the mail two months in advance. If you do not receive a letter, please call our office 07-2018 to schedule the 09-2018 follow-up appointment.   Thank you for choosing [facility] at [facility]!!

## 2017-10-16 NOTE — Progress Notes (Signed)
Cardiology Office Note   Date:  10/16/2017   ID:  Richard Franco, Richard Franco [birth year], MRN [id number]  PCP:  Starlyn Skeans, PA-C  Cardiologist: Dr. Aundra Dubin   Chief Complaint  Patient presents with  . Chest Pain    pt states some chest pain   . Shortness of Breath    some     History of Present Illness: Richard Franco is a 52 y.o. male who presents for ongoing assessment and management of chest pain.  The patient was seen by Dr. Lauree [facility] on 07/05/2016 on consultation due to patient's admission for chest pain and dyspnea.  He was noted by consult note that the patient had a cardiac cath in 2013 which revealed normal coronary arteries.  A repeat cardiac catheterization in 2014 again revealed no evidence of coronary artery disease but there was a concern for coronary vasospasm.  The patient was started on isosorbide but did not tolerate it well.  The patient was started on amlodipine and apparently was able to tolerate this.  The patient other history includes fibromyalgia, pulmonary infiltrates (groundglass opacities) esophageal stricture, anxiety, and ongoing tobacco abuse.  The patient did have CPX testing suggesting deconditioning as etiology for exertional dyspnea.  The patient is admitted to the ER on several occasions for recurrent chest pain and has been ruled out for ACS each time.  The patient called our office on 10/10/2017 with complaints of severe fatigue and was advised by his primary care to be seen by cardiology.  The patient has not taking Crestor on his own as he felt it might be causing some of his generalized muscle aches and pains.  He is complaining of sharp chest pain substernal.  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  The patient complains of overall generalized fatigue and muscle aches and pains.  He has not been followed for fibromyalgia by specialist.  He has continued to see his primary care provider.   Past Medical History:    Diagnosis Date  . Abnormal chest CT 03/2012   a. 2013: Scattered patchy ground glass opacities and pulmonary nodules. Transbronchial biopsy with no definite etiology of lung finding- possible organizing pneumonia. b. F/u CT 05/2014: stable multiple tiny pulm nodules, no further w/u recommended per notes.   . Anxiety   . Arthritis   . Chronic midline posterior neck pain   . Complication of anesthesia    " difficult to urinate after "  . Depression   . Esophageal stricture   . Fibromyalgia   . Fracture    back  . GERD (gastroesophageal reflux disease)   . Migraine headache    "none lately; I've had alot in my life" (06/21/2015)  . NSTEMI (non-ST elevated myocardial infarction) ([place]) 07/2012   a. Normal cath 03/2012. b. 07/2012: troponin 4, normal cors, ?vasospasm. Did not tolerate Imdur due to headache. On amlodipine.  . Testosterone deficiency   . Tobacco abuse     Past Surgical History:  Procedure Laterality Date  . ANKLE FUSION Left 2006  . BACK SURGERY    . CERVICAL FUSION  X7 "last one 04/2011"  . COLONOSCOPY    . CYSTECTOMY    . FRACTURE SURGERY    . LEFT HEART CATHETERIZATION WITH CORONARY ANGIOGRAM N/A 03/25/2012   Procedure: LEFT HEART CATHETERIZATION WITH CORONARY ANGIOGRAM;  Surgeon: Burnell Blanks, MD;  Location[facility] CATH LAB;  Service: Cardiovascular;  Laterality: N/A;  . LEFT HEART CATHETERIZATION WITH CORONARY ANGIOGRAM N/A  08/11/2012   Procedure: LEFT HEART CATHETERIZATION WITH CORONARY ANGIOGRAM;  Surgeon: Peter M Martinique, MD;  Location: [facility] CATH LAB;  Service: Cardiovascular;  Laterality: N/A;  . POSTERIOR CERVICAL FUSION/FORAMINOTOMY  05/09/2011   Procedure: POSTERIOR CERVICAL FUSION/FORAMINOTOMY LEVEL 1;  Surgeon: Hosie Spangle;  Location: [facility] NEURO ORS;  Service: Neurosurgery;  Laterality: N/A;  C4/5 posterior arthrodesis with instrumentation   . UPPER GASTROINTESTINAL ENDOSCOPY    . VIDEO BRONCHOSCOPY  05/06/2012   Procedure: VIDEO BRONCHOSCOPY WITH  FLUORO;  Surgeon: Jyl Heinz, MD;  Location: [facility] ENDOSCOPY;  Service: Cardiopulmonary;  Laterality: Bilateral;     Current Outpatient Medications  Medication Sig Dispense Refill  . albuterol (PROVENTIL HFA;VENTOLIN HFA) 108 (90 Base) MCG/ACT inhaler Inhale 2 puffs into the lungs every 6 (six) hours as needed for wheezing or shortness of breath. 1 Inhaler 6  . amLODipine (NORVASC) 5 MG tablet Take 5 mg by mouth 2 (two) times daily.    Marland Kitchen aspirin EC 81 MG EC tablet Take 1 tablet (81 mg total) by mouth daily.    Marland Kitchen esomeprazole (NEXIUM) 40 MG capsule TAKE ONE CAPSULE BY MOUTH TWICE A DAY BEFORE A MEAL. 180 capsule 3  . FLUoxetine (PROZAC) 20 MG capsule Take 20 mg by mouth 2 (two) times daily.     Marland Kitchen HYDROcodone-acetaminophen (NORCO/VICODIN) 5-325 MG tablet Take 1 tablet by mouth every 6 (six) hours as needed for moderate pain.    Marland Kitchen lamoTRIgine (LAMICTAL) 100 MG tablet Take 150 mg by mouth 2 (two) times daily.     . nitroGLYCERIN (NITROSTAT) 0.4 MG SL tablet DISSOLVE ONE TABLET UNDER THE TONGUE EVERY 5 MINUTES AS NEEDED FOR CHEST PAIN.  DO NOT EXCEED A TOTAL OF 3 DOSES IN 15 MINUTES 25 tablet 6  . promethazine (PHENERGAN) 25 MG tablet Take 25 mg by mouth every 6 (six) hours as needed for nausea or vomiting.    . rosuvastatin (CRESTOR) 10 MG tablet Take 10 mg by mouth daily.     No current facility-administered medications for this visit.     Allergies:   Midodrine hcl and Sulfa antibiotics    Social History:  The patient  reports that he quit smoking about 15 months ago. His smoking use included cigarettes. He has a 27.50 pack-year smoking history. He has quit using smokeless tobacco. His smokeless tobacco use included chew. He reports that he does not drink alcohol or use drugs.   Family History:  The patient's family history includes Asthma in his sister; Coronary artery disease in his paternal uncle; Diabetes in his mother.    ROS: All other systems are reviewed and negative. Unless  otherwise mentioned in H&P    PHYSICAL EXAM: VS:  BP 102/80   Pulse 83   Ht 5\' 9"  (1.753 m)   Wt 193 lb 6.4 oz (87.7 kg)   BMI 28.56 kg/m  , BMI Body mass index is 28.56 kg/m. GEN: Well nourished, well developed, in no acute distress  HEENT: normal  Neck: no JVD, carotid bruits, or masses Cardiac: RRR; no murmurs, rubs, or gallops,no edema  Respiratory:  clear to auscultation bilaterally, normal work of breathing GI: soft, nontender, nondistended, + BS MS: no deformity or atrophy  Skin: warm and dry, no rash Neuro:  Strength and sensation are intact Psych: euthymic mood, full affect   EKG: Normal sinus rhythm heart rate of 83 bpm without acute changes.  Recent Labs: 07/01/2017: BUN 17; Creatinine, Ser 1.33; Hemoglobin 14.2; Platelets 186; Potassium 4.5; Sodium 136  Lipid Panel    Component Value Date/Time   CHOL 203 (H) 07-Jul-2016 0222   TRIG 134 07/07/2016 0222   HDL 54 07-07-16 0222   CHOLHDL 3.8 07/07/16 0222   VLDL 27 07-07-2016 0222   LDLCALC 122 (H) 07-07-16 0222      Wt Readings from Last 3 Encounters:  10/16/17 193 lb 6.4 oz (87.7 kg)  07/01/17 190 lb (86.2 kg)  07/04/16 187 lb 1.6 oz (84.9 kg)      Other studies Reviewed: Echocardiogram Jul 07, 2016  Left ventricle: Inferobasal hypokinesis. The cavity size was   normal. Systolic function was normal. The estimated ejection   fraction was in the range of 55% to 60%. Wall motion was normal;   there were no regional wall motion abnormalities. Left   ventricular diastolic function parameters were normal. - Atrial septum: A patent foramen ovale cannot be excluded.  Cardiac Cath 08/11/2016  Coronary angiography: Coronary dominance: left  Left mainstem:  Short, normal.  Left anterior descending (LAD): Normal.  Left circumflex (LCx): Dominant, normal.  Right coronary artery (RCA): small, nondominant, normal.  Left ventriculography: Left ventricular systolic function is normal, LVEF is  estimated at 55-65%, there is no significant mitral regurgitation   Final Conclusions:   1. Normal coronary anatomy. 2. Normal LV function.   ASSESSMENT AND PLAN:  1.  Chronic chest pain: Does not appear to be cardiac in etiology.  There was a consideration that he was having cardiac basal spasming and has been placed on amlodipine.  This apparently has not been helpful to him concerning symptoms.  The patient has had labile blood pressure.  He states that his blood pressure often rises substantially when he is in a lot of pain, they have trying to decrease his amlodipine dose in the past that his blood pressure increases and therefore he wishes to stay on the same dose to avoid high readings.  I would not recommend any further cardiac testing as he has had multiple cardiovascular examinations and testing which have provided no new information or found cardiac etiology for his discomfort.  2.  Hypertension: Continues on amlodipine 5 mg daily.  He does have some low normal blood pressure today at 102/80 which can be contributing to his generalized fatigue.  I discussed decreasing but he states that his blood pressure really goes high with adjustments in dosing.  We will continue him at current dose unless he really becomes symptomatic which would include dizziness or near syncope.  3.  Hypercholesterolemia: The patient stopped taking Crestor on his own accord for the last 5 to 7 days in an effort to see if statin is causing his myalgia pain.  He is seeing no difference.  If it wants to wait a little longer before restarting.  I would recommend that he restart his Crestor if his myalgia pain does not improve being off of it.  4.  Generalized muscle aches pains.  He has a history of fibromyalgia in the past but is not currently being treated for this.  I would recommend neurologic consult versus symptomatic treatment with Lyrica.  Will defer to primary care for recommendations and  referrals.  Current medicines are reviewed at length with the patient today.    Labs/ tests ordered today include:  None   Phill Myron. West Pugh, ANP, AACC   10/16/2017 9:08 AM    [facility] [address], [place], [place] [address] Phone: [phone]; Fax: [phone]

## 2017-11-04 DIAGNOSIS — I10 Essential (primary) hypertension: Secondary | ICD-10-CM | POA: Diagnosis not present

## 2017-11-04 DIAGNOSIS — L989 Disorder of the skin and subcutaneous tissue, unspecified: Secondary | ICD-10-CM | POA: Diagnosis not present

## 2017-11-04 DIAGNOSIS — S90859A Superficial foreign body, unspecified foot, initial encounter: Secondary | ICD-10-CM | POA: Diagnosis not present

## 2017-11-04 DIAGNOSIS — I509 Heart failure, unspecified: Secondary | ICD-10-CM | POA: Diagnosis not present

## 2017-11-05 DIAGNOSIS — S90859A Superficial foreign body, unspecified foot, initial encounter: Secondary | ICD-10-CM | POA: Diagnosis not present

## 2017-11-05 DIAGNOSIS — M79671 Pain in right foot: Secondary | ICD-10-CM | POA: Diagnosis not present

## 2017-11-05 DIAGNOSIS — R05 Cough: Secondary | ICD-10-CM | POA: Diagnosis not present

## 2017-12-03 ENCOUNTER — Ambulatory Visit (INDEPENDENT_AMBULATORY_CARE_PROVIDER_SITE_OTHER): Payer: Medicare Other | Admitting: Internal Medicine

## 2017-12-03 ENCOUNTER — Telehealth: Payer: Self-pay | Admitting: Internal Medicine

## 2017-12-03 ENCOUNTER — Other Ambulatory Visit (INDEPENDENT_AMBULATORY_CARE_PROVIDER_SITE_OTHER): Payer: Medicare Other

## 2017-12-03 ENCOUNTER — Ambulatory Visit (INDEPENDENT_AMBULATORY_CARE_PROVIDER_SITE_OTHER)
Admission: RE | Admit: 2017-12-03 | Discharge: 2017-12-03 | Disposition: A | Discharge status: Home / Self Care | Payer: Medicare Other | Source: Ambulatory Visit | Attending: Internal Medicine | Admitting: Internal Medicine

## 2017-12-03 ENCOUNTER — Encounter: Payer: Self-pay | Admitting: Internal Medicine

## 2017-12-03 VITALS — BP 120/80 | HR 71 | Ht 69.0 in | Wt 188.4 lb

## 2017-12-03 DIAGNOSIS — R0609 Other forms of dyspnea: Secondary | ICD-10-CM | POA: Diagnosis not present

## 2017-12-03 DIAGNOSIS — Z87891 Personal history of nicotine dependence: Secondary | ICD-10-CM | POA: Diagnosis not present

## 2017-12-03 DIAGNOSIS — Z87898 Personal history of other specified conditions: Secondary | ICD-10-CM | POA: Diagnosis not present

## 2017-12-03 DIAGNOSIS — R5383 Other fatigue: Secondary | ICD-10-CM

## 2017-12-03 DIAGNOSIS — R079 Chest pain, unspecified: Secondary | ICD-10-CM

## 2017-12-03 DIAGNOSIS — F1911 Other psychoactive substance abuse, in remission: Secondary | ICD-10-CM

## 2017-12-03 LAB — BASIC METABOLIC PANEL
BUN: 9 mg/dL (ref 6–23)
CO2: 30 mEq/L (ref 19–32)
Calcium: 9.6 mg/dL (ref 8.4–10.5)
Chloride: 104 mEq/L (ref 96–112)
Creatinine, Ser: 1.21 mg/dL (ref 0.40–1.50)
GFR: 67.14 mL/min (ref >60.00)
Glucose, Bld: 99 mg/dL (ref 70–99)
Potassium: 4.8 mEq/L (ref 3.5–5.1)
Sodium: 139 mEq/L (ref 135–145)

## 2017-12-03 LAB — CBC WITH DIFFERENTIAL/PLATELET
Basophils Absolute: 0 10*3/uL (ref 0.0–0.1)
Basophils Relative: 0.4 % (ref 0.0–3.0)
Eosinophils Absolute: 0.1 10*3/uL (ref 0.0–0.7)
Eosinophils Relative: 1 % (ref 0.0–5.0)
HCT: 42.3 % (ref 39.0–52.0)
Hemoglobin: 14.3 g/dL (ref 13.0–17.0)
Lymphocytes Relative: 17.2 % (ref 12.0–46.0)
Lymphs Abs: 1.1 10*3/uL (ref 0.7–4.0)
MCHC: 33.8 g/dL (ref 30.0–36.0)
MCV: 90.4 fl (ref 78.0–100.0)
Monocytes Absolute: 0.4 10*3/uL (ref 0.1–1.0)
Monocytes Relative: 7.2 % (ref 3.0–12.0)
Neutro Abs: 4.7 10*3/uL (ref 1.4–7.7)
Neutrophils Relative %: 74.2 % (ref 43.0–77.0)
Platelets: 172 10*3/uL (ref 150.0–400.0)
RBC: 4.68 Mil/uL (ref 4.22–5.81)
RDW: 13.8 % (ref 11.5–15.5)
WBC: 6.3 10*3/uL (ref 4.0–10.5)

## 2017-12-03 LAB — T4, FREE: Free T4: 1.1 ng/dL (ref 0.60–1.60)

## 2017-12-03 LAB — TSH: TSH: 1.85 u[IU]/mL (ref 0.35–4.50)

## 2017-12-03 NOTE — Addendum Note (Signed)
Addended by: Madolyn Frieze on: 12/03/2017 10:52 AM   Modules accepted: Orders

## 2017-12-03 NOTE — Progress Notes (Addendum)
Subjective:     Patient ID: Richard Franco, male   DOB: 09/29/1965, 52 y.o.   MRN: [id number]  HPI     HPI 52 year old male with PMH of pericarditis one year ago of unknown reason who persistently had CP since. Presented to the cardiologist with atypical chest pain, somewhat pleuritic in nature.  Cath was performed that was normal. CTA of the chest was done that revealed 3 <1 cm pulmonary nodules and multiple areas of GGO.  Patient has been more SOB over the past 3 months. On further enquires he states that he noticed shortness of breath over the past several years, at rest and with exertion, associated with chest tightnes. Extensive cardiology evaluation did not reveal a cardiac etiology. In average reports daily symptoms,  2-3 times a day, waking up approximately 2 nights per month with dyspnea.  During hospitalization he was initiated on steroids. He endorsed improved symptoms with steroids. Denies cough or wheezing, but does states that sometimes when he is short of breath he hears a noise coming from his throat.   04/02/2012 Steroids tapered off. He did not not report a significant difference off steroids.   04/16/2012 Was seen by Dr. Melvyn Novas in clinic; CT chest showed persistent lung nodules and ground glass opacities in a different territory distribution. Steroids restarted at 20 mg daily; the patient was instructed to taper them to 10 mg.     04/30/12 The patient on 10 mg of prednisone daily;   05/06/12 Bronchoscopy with BAL, cell count and TBBx of the lingula  BAL cell count with elevated macrophages (possible COP, Hypersensitivity pneumonitis vs chronic microaspiration, possible Aluminum exposure)  RVP neg Viral culture neg  AFB smear neg, culture negative   Cytology/pathology benign with no evidence of aspiration or ILD    05/15/12  Remains on 10 mg steroids;  -methacholine challenge neg -barium swallow WNL  07/02/11 present visit -CT chest with resolution of GGO  -  persistent nodules  -steroids decreased to 5 mg   09/16/12  -CT with no GGO, lung nodules stable  -advised to stop steroids  Complains of shortness of breath at rest and with exertion; chest tightness but no wheezing; Denies cough, sputum production, hemoptysis, fever, chills, rash, PND, orthopnea, LE edema, calf tenderness or LE edema. Has had muscle pain after initiating statins; in the interim resolved.   Since the previous visit he presented to the ED and was found to have elevated troponins heart catheterization negative for CAD. He continues to have chest tightness, initiated cardizem and now reports feeling better.   Acute OV 11/02/12 --  Hx of dyspnea, chest discomfort felt to be coronary vasospasm, GGI on CT chest that resolved as of 08/2012 CT scan. Presents today c/o about 3 weeks of tachycardia w exertion, some cough for ~ a week, usually non-productive, chest tightness. Saw some dark specks in his mucous several times w cough 10/22/12, no over blood, none since then. He is having nasal congestion and allergy sx. No sick contacts. He stopped prednisone in mid March. Of note he has had progressive joint pain in his fingers and hands since stopping the prednisone.   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 No other autoimmune manifestations and no changes in environment. No drug use.   Occupation: toxic exposure to aluminum, steel, plastic dust Travels:no travels abroad TB exposure:denies Pets: none  REC Please start loratadine 10mg  daily  Please start fluticasone  nasal spray, 2 sprays each side twice a day  CXR today  Follow with Dr Chase Caller in 4 weeks to assess your symptoms. You may need to discuss repeating your CT scan of the chest at that time.    OV 01/14/2013   He has multiple issues which include arthritis, fibromyalgia, depression, GERD, anxiety, migraines, and tobacco abuse (quit smoking 9 months  ago). He has had a recent NSTEMI with cath showing normal coronary arteries, EF of 55 to 65%. This was felt to be due to vasospasm. D dimer was positive but CT was negative for PE. He has had a chronically abnormal chest CT. March 2014: shown stable scattered bilateral pulmonary nodules most c/w underlying infectious/inflammatory or granulomatous proces 48mm - 56mm; plan to repeat in 6-12 months which would be Oct 2014/March 2015   Now following with pumonray. Transition of care from Dr Conception Chancy to Dr Chase Caller. Since prior pulmonary visit in the interim Has see rheum Dr Amil Amen recently an dplaced on 10 day prednisone. Is having autoimmune panel repeated by rheumatology This is for diffuse joint pain and some myalgia.  In terms of pulmonary symptoms: Still has chronic early morning chest tightness and not relieved by albuterol. Refrains from NTG use .,.Adiditonally still with moderate dyspnea on exertion, esp for hills at golf. Relieved by rest. Unknown if releived by NTG; refrains. Associated chest ttightness + No wheeze or cough or syncope. STable. Insidious onset of months to years. No PND or orthopnea  [facility]  reports that he quit smoking about 9 months ago. His smoking use included Cigarettes. He has a 27 pack-year smoking history. He has quit using smokeless tobacco. His smokeless tobacco use included Chew.   Labs  normal spirometry oct and  nov 2013 Oct 2013: autoimmne ANA, ACE, ANCA, RF, - negative March 2014 CT chest: clearing GGI but small nodules 7mm x 2  REC Pleae have cardiopulmonary stress tst  Return to see me after that    OV 03/01/2013 Follow up dyspnea and fatigue. He had cardiopulmonary stress test on 02/23/2013 and the results of the lobe and suggest physical deconditioning as a cause for dyspnea and fatigue. He surprised that the result because he says that he does activities of daily living and is doing yard work and also walk in [employer] and other work in [employer].  However, he states that symptoms have been persisting since 2006/2008 and he had a viral illness following exposure to his son with  respiratory viru and who in turn was exposed to a kid with measles. He states that all his dyspnea and fatigue his since then even though he recovered from the acute illness. He recollects being infectious diseases at [place] and recollects changing diagnoses in 2008. He starts are not available to me but I do note that so the results are mentioned in July 2008. He was finally told that he had parvovirus infection.    also he states that he is known to have low testosterone and prior thyroid issues but does not have endocrinologist on record  CPST 02/23/2013  Procedure: This patient underwent staged symptom-limited exercise testing using a individualized bike protocol with expired gas analysis metabolic evaluation during exercise.  Demographics  Age: 7 Ht. (in.) 72 Wt. (lb) 178 BMI: 27.1 Predicted Peak VO2: 33.6 ml/kg/min  Gender: Male Ht (cm) 172.7 Wt. (kg) 80.7    Results  Pre-Exercise PFTs   FVC 4.87 (101%)   FEV1 3.93 (103%)   FEV1/FVC 81  MVV 162 (107%)  Post-Exercise PFTs (from lowest post-exercise trial (%change from rest))  FVC 4.87 (0%)   FEV1 3.95 (0%)   FEV1/FVC 80%    Exercise Time: 9:24 Watts: 180  RPE: 20  Reason stopped: Patient stopped due to leg fatigue and dyspnea (7/10)  Additional symptoms: Chest discomfort (upper left chest) 4/10  Resting HR: 72 Peak HR: 151 (87% age predicted max HR)  BP rest: 113/78 BP peak: 190/64  Peak VO2: 25.6 (76.2% predicted peak VO2)  VE/VCO2 slope: 31.2  OUES: 1.98  Peak RER: 1.28  Ventilatory Threshold: 11.3 (33.6% predicted peak VO2)  Peak RR 30  Peak Ventilation: 82.8  VE/MVV: 51.2%  PETCO2 at peak: 37  O2pulse: 14 (88% predicted O2pulse)   Interpretation  Notes: Patient gave a very good effort. The pulse-oximetry remained at 96% or greater throughout  the exercise. Exercise was performed on a cycle ergometer starting at [facility] and increasing by 20 W/min.  ECG: Resting ECG in normal sinus rhythm. There was an adequate HR response to the exercise with no ST-T changes or arrhythmias, however patient did not reach age predicted maximal HR. The BP response was appropriate.  PFT: Resting spirometry within normal limits. The MVV was normal. Post-exercise spirometry was performed IPE, 5, 10, 15 and 20 (reported above) minutes of recovery with no significant changes from rest to indicate respiratory muscle fatigue or EIB.  CPX: The RER of 1.28 indicates a maximal effort. The peak VO2 is mildly reduced at 25.6 ml/kg/min (76% of the age/gender/weight matched sedentary norm). The VO2 a the ventilatory threshold was well below normal at 33.6% of the predicted peak VO2. At peak exercise the ventilation was 51% of the measured MVV indicating ventilatory reserve remained (respiratory rate was below the expected range, Vt/IC [90%] was at its limit, PETCO2 was high normal). There was an overall inappropriate HR response to the exercise with a HR reserve of 23 bpm. The O2pulse (a surrogate for stroke volume) increased throughout the exercise reaching 14 ml/beat (88% of predicted). The VE/VCO2 slope was mildly elevated suggesting some reduced ventilatory efficiency. The oxygen uptake efficiency slope (OUES) was mildly reduced and reflects the patient's measured functional capacity.  Conclusion: Exercise testing with gas exchange demonstrates a mild functional limitation when compared to matched sedentary norms. The patient's tidal volume during exercise appears to be a limiting factor, couple with the low respiratory rate. The elevated tidal volume during exercise can induce a sensation of not being able to get a full breath and may cause chest discomfort. There also appears to be significant deconditioning. ^^^Preliminary CPX Results, Finalized  results will be forwarded when completed by interpreting physician.^^^  Prepared by: Linzie Collin, MEd, ACSM-RCEP Sr. Exercise Physiologist 02/23/2013 2:52 PM   REC  Your breathing test suggests physical deconditioning whic could be due to the unexplained viral illness you had several years ago [facility] pulmonary [facility] REfer Dr Chalmers Cater for endocrine causes of fatgue  REturn early Dec 2014 wih CT chest   OV 06/14/2013  FU   Dyspnea/fatigue and lung nodule  Dyspnea/FAigute  - stome improved. Beginning to workout. DR balan has him on LOw T replacement. STill he is not satisfied with level of improvement. Also describing some early moprning chest tightness in front of sternum when he gets up x 1 month. No reprodyucible tenderness. No fever. No chills. Reivew of records indicate that [facility] have started him on prednisone in OCt 2013 but stopped March 2014. This was  for GGO in lung. However, in July 2014 he indicated rheum Dr Amil Amen started him on predinose. Review of med list shows that he is not in it right now  CT chest 06/04/13  IMPRESSION:  1. Stable small, nonspecific pulmonary nodules. Its is these nodules  have remained unchanged since 03/24/2012. The largest nodule  measures 5 mm. If this patient is at low risk for lung cancer then  no further followup is necessary. In a patient with increased risk  for lung cancer documented stability for 18-24 months is advised.  This recommendation follows the consensus statement: Guidelines for  Management of Small Pulmonary Nodules Detected on CT Scans: A  Statement from the [place] as published in Radiology  2005; 237:395-400.  Electronically Signed  By: Kerby Moors M.D.  On: 06/04/2013 13:35          Result Notes    Notes Recorded by Brand Males, MD on 06/09/2013 at 1:02 AM Will disuccc CT results ov 06/14/13. Nodules stable x 1 year. Next Ct in 57months or so     OV  07/19/2014  Chief Complaint  Patient presents with  . Follow-up    Pt stated his breathing is unchanged since last OV. Pt c/o DOE and CP when active. Pt denies cough.    One-year follow-up for pulmonary nodule in association with dyspnea, fatigue and prior smoking history  This past one year he says that he does not have dyspnea anymore or any respiratory symptoms for that matter. However, he continues to have chronic fatigue associated with arthralgia. He has not followed up with his rheumatologist Dr. Amil Amen was previously deemed that he has not found to have any autoimmune disease. He has not followed up with Dr. Chalmers Cater his endocrinologist. He continues on testosterone patch but this does not seem to help him. Overall he is frustrated by situation. Smoking continues to be in remission  I did elicit from him that as a young man he used to work out excessively in the gym lifting weights and doing aerobics. However in the last several years she's not doing this anymore and he is physically deconditioned. It is possible that this is an etiology for his fatigue    OV 11/16/2015  Chief Complaint  Patient presents with  . Acute Visit    Pt c/o some CP with inspiration x 1 week. Pt notes some SOB with exertion but nothing more increased than normal. Pt notes some pressure center chest when laying flat. Denies cough and wheeze.     Not seen him in over one year. Is always had a complex symptomatology for which we never had good reasoning. He now complains about atypical chest pain that is present anteriorly that is constant. Occasionally has relief. It is been going on for 10 days. It is mild to moderate in intensity. It is new. It is different from previous symptoms. It can get worse occasionally when he takes a deep breath. There no clear cut relieving factors. He has chronic shortness of breath and fatigue that has really not resolve. Review of the past medical record shows that he visited GI in  April 2017. They noticed weight loss. If he gets significant acid reflux. Off note he has a strong history of cervical disc disease. He had extensive cervical surgery with fusion of multiple disks over 15 years ago. He has limited range of motion of his neck. He denies any thoracic disc pain. There is no associated wheezing  Exhaled nitric oxide  today in the office i 11 ppb and normal  Is not too keen about having extensive workup for these symptoms.    Chief Complaint  Patient presents with  . Follow-up    Hosp follow up     Referring provider: Chipper Herb, MD  HPI: 52 year old male smoker followed for dyspnea and GGO on CT with initial presentation 2013 ? COP /smoking related  -previously resolved with steroids . Extensive workup for dyspnea.  Seen at [facility] by Dr. Randol Kern 2014.   TEST /Significant events  07/02/11 present visit -CT chest with resolution of GGO  - persistent nodules  -steroids decreased to 5 mg  05/06/12 Bronchoscopy with BAL, cell count and TBBx of the lingula  BAL cell count with elevated macrophages (possible COP, Hypersensitivity pneumonitis vs chronic microaspiration, possible Aluminum exposure)  RVP neg Viral culture neg  AFB smear neg, culture negative  Cytology/pathology benign with no evidence of aspiration or ILD  /22/13 Remains on 10 mg steroids;  -methacholine challenge neg -barium swallow WNL Alpha 1 MS 125   09/16/12  -CT with no GGO, lung nodules stable  Previous cardiopulmonary stress test in 2014 showed deconditioning. 2D echo in 2013 showed grade 1 diastolic dysfunction with EF of 55-60%. Coronary angiography in 2014 showed normal coronary anatomy 04/2016 normal with no restriction or airflow obstruction  2017 Exhaled nitric oxide today in the office i 11 ppb and normal CT chest 01/2016 showed GGO bilaterally greatest in RML/RUL .  CT chest 12/21 showed complete resolution of GGO   07/23/2016 Follow up : GGO on CT -Prob COP (smoking  related) -steroid responsive.  Pt returns for post hospital follow up . Pt was admitted 2 weeks ago for atypical chest pain . Cardiac enzymes were neg. Echo showed nml EF w/ on wall motion abn. Drug screen was positive for Cocaine .  Pt returns today , says since discharge he says chest pain has resolved and no reoccurence . He has not smoking cigarettes. Discussed cessation and cocaine use /dangers./along drug cessation  He says he continues to have no energy .  Denies cough .  He is currently on prednisone 5mg  (slow taper ) for suspected COP . Last CT chest and CXR showed resolution of GGO.  We discussed further taper to off.    OV 12/03/2017  Chief Complaint  Patient presents with  . Acute Visit    Pt has c/o pain in his chest when he breathes which he has noticed x3 months which has become worse. Pt also states he has severe fatigue.    [name] , 52 y.o. , with dob [birth year] and male ,Not Hispanic or Latino from [address] [place] [address] - presents to pulm clinic for ongoing and new issues.  I personally have not seen Mr. Corsino in nearly 2 years.  At this point in time he tells me that he continues to have ongoing shortness of breath with exertion and ongoing significant fatigue.  These are unchanged to slightly worse over time.  But in the last 3 months he is developed new onset of chest pains that are present with inspiration but also with exertion.  Presents daily on and off it does not wake him up in the middle of the night.  Moderate in intensity.  Review of the chart indicates that in the interim 1 of the urine tox screens during admission was positive for cocaine.  He also had organizing pneumonia that responded to prednisone.  His most recent chest x-ray January 2019 that I personally visualized and is clear.  Currently he says that he continues to smoke cigarettes but denies any cocaine use.  He is his cardiologist cleared him of any cardiac reasons for chest  pains therefore he is in pulmonary clinic today.       has a past medical history of Abnormal chest CT (03/2012), Anxiety, Arthritis, Chronic midline posterior neck pain, Complication of anesthesia, Depression, Esophageal stricture, Fibromyalgia, Fracture, GERD (gastroesophageal reflux disease), Migraine headache, NSTEMI (non-ST elevated myocardial infarction) ([place]) (07/2012), Testosterone deficiency, and Tobacco abuse.   reports that he has been smoking cigarettes.  He has a 28.00 pack-year smoking history. He has quit using smokeless tobacco. His smokeless tobacco use included chew.  Past Surgical History:  Procedure Laterality Date  . ANKLE FUSION Left 2006  . BACK SURGERY    . CERVICAL FUSION  X7 "last one 04/2011"  . COLONOSCOPY    . CYSTECTOMY    . FRACTURE SURGERY    . LEFT HEART CATHETERIZATION WITH CORONARY ANGIOGRAM N/A 03/25/2012   Procedure: LEFT HEART CATHETERIZATION WITH CORONARY ANGIOGRAM;  Surgeon: Burnell Blanks, MD;  Location: [facility] CATH LAB;  Service: Cardiovascular;  Laterality: N/A;  . LEFT HEART CATHETERIZATION WITH CORONARY ANGIOGRAM N/A 08/11/2012   Procedure: LEFT HEART CATHETERIZATION WITH CORONARY ANGIOGRAM;  Surgeon: Peter M Martinique, MD;  Location: [facility] CATH LAB;  Service: Cardiovascular;  Laterality: N/A;  . POSTERIOR CERVICAL FUSION/FORAMINOTOMY  05/09/2011   Procedure: POSTERIOR CERVICAL FUSION/FORAMINOTOMY LEVEL 1;  Surgeon: Hosie Spangle;  Location: [facility];  Service: Neurosurgery;  Laterality: N/A;  C4/5 posterior arthrodesis with instrumentation   . UPPER GASTROINTESTINAL ENDOSCOPY    . VIDEO BRONCHOSCOPY  05/06/2012   Procedure: VIDEO BRONCHOSCOPY WITH FLUORO;  Surgeon: Jyl Heinz, MD;  Location: [facility] ENDOSCOPY;  Service: Cardiopulmonary;  Laterality: Bilateral;    Allergies  Allergen Reactions  . Midodrine Hcl Swelling    Tongue swelling  . Sulfa Antibiotics Rash    Immunization History  Administered Date(s) Administered  . Influenza  Split 04/16/2012  . Influenza,inj,Quad PF,6+ Mos 03/01/2013, 05/08/2015, 05/30/2017  . Influenza,inj,quad, With Preservative 05/02/2016  . Influenza-Unspecified 04/22/2016  . Pneumococcal Polysaccharide-23 04/16/2012    Family History  Problem Relation Age of Onset  . Diabetes Mother   . Coronary artery disease Paternal Uncle   . Asthma Sister   . Colon cancer Neg Hx   . Colon polyps Neg Hx   . Rectal cancer Neg Hx   . Stomach cancer Neg Hx      Current Outpatient Medications:  .  amLODipine (NORVASC) 5 MG tablet, Take 5 mg by mouth 2 (two) times daily., Disp: , Rfl:  .  aspirin EC 81 MG EC tablet, Take 1 tablet (81 mg total) by mouth daily., Disp: , Rfl:  .  esomeprazole (NEXIUM) 40 MG capsule, TAKE ONE CAPSULE BY MOUTH TWICE A DAY BEFORE A MEAL., Disp: 180 capsule, Rfl: 3 .  FLUoxetine (PROZAC) 20 MG capsule, Take 20 mg by mouth 2 (two) times daily. , Disp: , Rfl:  .  HYDROcodone-acetaminophen (NORCO/VICODIN) 5-325 MG tablet, Take 2 tablets by mouth daily. , Disp: , Rfl:  .  lamoTRIgine (LAMICTAL) 100 MG tablet, Take 150 mg by mouth 2 (two) times daily. , Disp: , Rfl:  .  promethazine (PHENERGAN) 25 MG tablet, Take 25 mg by mouth every 6 (six) hours as needed for nausea or vomiting., Disp: , Rfl:  .  albuterol (PROVENTIL HFA;VENTOLIN  HFA) 108 (90 Base) MCG/ACT inhaler, Inhale 2 puffs into the lungs every 6 (six) hours as needed for wheezing or shortness of breath. (Patient not taking: Reported on 12/03/2017), Disp: 1 Inhaler, Rfl: 6 .  nitroGLYCERIN (NITROSTAT) 0.4 MG SL tablet, DISSOLVE ONE TABLET UNDER THE TONGUE EVERY 5 MINUTES AS NEEDED FOR CHEST PAIN.  DO NOT EXCEED A TOTAL OF 3 DOSES IN 15 MINUTES (Patient not taking: Reported on 12/03/2017), Disp: 25 tablet, Rfl: 6 .  QUEtiapine (SEROQUEL) 25 MG tablet, Take 25 mg by mouth at bedtime., Disp: , Rfl: 0   Review of Systems     Objective:   Physical Exam  Constitutional: He is oriented to person, place, and time. He appears  well-developed and well-nourished. No distress.  HENT:  Head: Normocephalic and atraumatic.  Right Ear: External ear normal.  Left Ear: External ear normal.  Mouth/Throat: Oropharynx is clear and moist. No oropharyngeal exudate.  Eyes: Pupils are equal, round, and reactive to light. Conjunctivae and EOM are normal. Right eye exhibits no discharge. Left eye exhibits no discharge. No scleral icterus.  Neck: Normal range of motion. Neck supple. No JVD present. No tracheal deviation present. No thyromegaly present.  Cardiovascular: Normal rate, regular rhythm and intact distal pulses. Exam reveals no gallop and no friction rub.  No murmur heard. Pulmonary/Chest: Effort normal and breath sounds normal. No respiratory distress. He has no wheezes. He has no rales. He exhibits no tenderness.  Abdominal: Soft. Bowel sounds are normal. He exhibits no distension and no mass. There is no tenderness. There is no rebound and no guarding.  Musculoskeletal: Normal range of motion. He exhibits no edema or tenderness.  Lymphadenopathy:    He has no cervical adenopathy.  Neurological: He is alert and oriented to person, place, and time. He has normal reflexes. No cranial nerve deficit. Coordination normal.  Skin: Skin is warm and dry. No rash noted. He is not diaphoretic. No erythema. No pallor.  Psychiatric: He has a normal mood and affect. His behavior is normal. Judgment and thought content normal.  Nursing note and vitals reviewed.  Today's Vitals   12/03/17 0934  BP: 120/80  Pulse: 71  SpO2: 97%  Weight: 188 lb 6.4 oz (85.5 kg)  Height: 5\' 9"  (1.753 m)    Estimated body mass index is 27.82 kg/m as calculated from the following:   Height as of this encounter: 5\' 9"  (1.753 m).   Weight as of this encounter: 188 lb 6.4 oz (85.5 kg).      Assessment:       ICD-10-CM   1. Chest pain, unspecified type R07.9 CBC with Differential    DG Chest 2 View    Pulmonary function test  2. History of  tobacco abuse Z87.891 Pulmonary function test  3. Dyspnea on exertion R06.09 TSH    T4, free    Basic Metabolic Panel (BMET)    Drug Screen, Urine  4. Other fatigue R53.83 Drug Screen, Urine  5. History of substance abuse Z87.898    \    Plan:      Symptoms remain unexplained No evidence of asthma on feno testing 12/03/2017  PLAN Do cbc with diff, tsh, free t4, bmet, HIV blood test 12/03/2017 Do Urine tox screen 12/03/2017 Do CXR 2 view Do full PFT  Followup Return to see app next few weeks to discuss test results   Dr. Brand Males, M.D., [facility].C.P Pulmonary and Critical Care Medicine Staff Physician, [facility] Director -  Interstitial Lung Disease  Program  Pulmonary [place] at [place], [place], [address]  Pager: [phone], If no answer or between  15:00h - 7:00h: call [phone] Telephone: [phone]

## 2017-12-03 NOTE — Patient Instructions (Addendum)
ICD-10-CM   1. Chest pain, unspecified type R07.9 CBC with Differential    DG Chest 2 View    Pulmonary function test  2. History of tobacco abuse Z87.891 Pulmonary function test  3. Dyspnea on exertion R06.09 TSH    T4, free    Basic Metabolic Panel (BMET)    Drug Screen, Urine  4. Other fatigue R53.83 Drug Screen, Urine  5. History of substance abuse Z87.898    Symptoms remain unexplained No evidence of asthma on feno testing 12/03/2017  PLAN Do cbc with diff, tsh, free t4, bmet, HIV blood test 12/03/2017 Do Urine tox screen 12/03/2017 Do CXR 2 view Do full PFT  Followup Return to see app next few weeks to discuss test results

## 2017-12-03 NOTE — Telephone Encounter (Signed)
Thanks. That is fine. We will address HIV testing if needed at next vsiit

## 2017-12-03 NOTE — Telephone Encounter (Signed)
Pt was seen 12/03/17 by MR for a follow-up OV.  After the OV, labs were ordered and one specific lab order that was requested was HIV.  With the associated diagnoses per MR, per pt's insurance, a hard stop kept coming up stating the associated diagnoses were not accurate for the lab that was being ordered.  Per MR, at this time, do not order the HIV lab and wait for the other labs to come back to see what the results are at that point.  Routing message to MR.

## 2017-12-04 ENCOUNTER — Telehealth: Payer: Self-pay | Admitting: Internal Medicine

## 2017-12-04 LAB — DRUG SCREEN, URINE
Amphetamines, Urine: NEGATIVE ng/mL
Barbiturate screen, urine: NEGATIVE ng/mL
Benzodiazepine Quant, Ur: NEGATIVE ng/mL
Cannabinoid Quant, Ur: NEGATIVE ng/mL
Cocaine (Metab.): NEGATIVE ng/mL
Opiate Quant, Ur: POSITIVE ng/mL — AB
PCP Quant, Ur: NEGATIVE ng/mL

## 2017-12-04 LAB — SPECIMEN STATUS REPORT

## 2017-12-04 NOTE — Telephone Encounter (Signed)
Notes recorded by Brand Males, MD on 12/04/2017 at 4:58 AM EDT Let Richard Franco know that blood work normal ---------------------------- Spoke with pt. He is aware of results. Nothing further was needed.

## 2017-12-16 DIAGNOSIS — R5383 Other fatigue: Secondary | ICD-10-CM | POA: Diagnosis not present

## 2017-12-16 DIAGNOSIS — R638 Other symptoms and signs concerning food and fluid intake: Secondary | ICD-10-CM | POA: Diagnosis not present

## 2017-12-19 ENCOUNTER — Telehealth: Payer: Self-pay | Admitting: Internal Medicine

## 2017-12-19 NOTE — Telephone Encounter (Signed)
I got denial for ICD code for urine tox 12/03/17. I updated dx for that visit to Z87.898 hx of substance abuse. Please see if you can link the lab test to that dx code. If not use I have filled it in the paper

## 2017-12-22 NOTE — Telephone Encounter (Signed)
Form has been faxed for pt.  Nothing further needed.

## 2017-12-23 DIAGNOSIS — I10 Essential (primary) hypertension: Secondary | ICD-10-CM | POA: Diagnosis not present

## 2017-12-23 DIAGNOSIS — M542 Cervicalgia: Secondary | ICD-10-CM | POA: Diagnosis not present

## 2017-12-23 DIAGNOSIS — Z6828 Body mass index (BMI) 28.0-28.9, adult: Secondary | ICD-10-CM | POA: Diagnosis not present

## 2017-12-23 DIAGNOSIS — M4722 Other spondylosis with radiculopathy, cervical region: Secondary | ICD-10-CM | POA: Diagnosis not present

## 2017-12-23 DIAGNOSIS — R51 Headache: Secondary | ICD-10-CM | POA: Diagnosis not present

## 2018-01-12 ENCOUNTER — Ambulatory Visit (INDEPENDENT_AMBULATORY_CARE_PROVIDER_SITE_OTHER): Payer: Medicare Other | Admitting: Internal Medicine

## 2018-01-12 DIAGNOSIS — R079 Chest pain, unspecified: Secondary | ICD-10-CM

## 2018-01-12 DIAGNOSIS — Z87891 Personal history of nicotine dependence: Secondary | ICD-10-CM | POA: Diagnosis not present

## 2018-01-12 LAB — PULMONARY FUNCTION TEST
DL/VA % pred: 92 %
DL/VA: 4.19 ml/min/mmHg/L
DLCO cor % pred: 90 %
DLCO cor: 26.84 ml/min/mmHg
DLCO unc % pred: 89 %
DLCO unc: 26.61 ml/min/mmHg
FEF 25-75 Post: 4.75 L/sec
FEF 25-75 Pre: 3.74 L/sec
FEF2575-%Change-Post: 27 %
FEF2575-%Pred-Post: 147 %
FEF2575-%Pred-Pre: 116 %
FEV1-%Change-Post: 5 %
FEV1-%Pred-Post: 108 %
FEV1-%Pred-Pre: 103 %
FEV1-Post: 3.96 L
FEV1-Pre: 3.77 L
FEV1FVC-%Change-Post: 4 %
FEV1FVC-%Pred-Pre: 104 %
FEV6-%Change-Post: 2 %
FEV6-%Pred-Post: 104 %
FEV6-%Pred-Pre: 101 %
FEV6-Post: 4.71 L
FEV6-Pre: 4.59 L
FEV6FVC-%Change-Post: 0 %
FEV6FVC-%Pred-Post: 103 %
FEV6FVC-%Pred-Pre: 103 %
FVC-%Change-Post: 0 %
FVC-%Pred-Post: 100 %
FVC-%Pred-Pre: 100 %
FVC-Post: 4.73 L
FVC-Pre: 4.69 L
Post FEV1/FVC ratio: 84 %
Post FEV6/FVC ratio: 99 %
Pre FEV1/FVC ratio: 80 %
Pre FEV6/FVC Ratio: 99 %
RV % pred: 88 %
RV: 1.73 L
TLC % pred: 100 %
TLC: 6.58 L

## 2018-01-12 NOTE — Progress Notes (Signed)
PFT done today. 

## 2018-01-13 ENCOUNTER — Encounter: Payer: Self-pay | Admitting: Internal Medicine

## 2018-01-13 ENCOUNTER — Ambulatory Visit (INDEPENDENT_AMBULATORY_CARE_PROVIDER_SITE_OTHER): Payer: Medicare Other | Admitting: Internal Medicine

## 2018-01-13 VITALS — BP 118/84 | HR 85 | Ht 69.0 in | Wt 189.0 lb

## 2018-01-13 DIAGNOSIS — R0609 Other forms of dyspnea: Secondary | ICD-10-CM | POA: Diagnosis not present

## 2018-01-13 NOTE — Patient Instructions (Signed)
ICD-10-CM   1. Dyspnea on exertion R06.09     Persists and unexplained LAb work, cxr and urine tox normal Wondering if  spine issues causing shortness of breath  Plan You do not need CT chest because recent one Jan 2019 was normal Do CPST bike test with EIB challenge through [facility]  Followup After CPST

## 2018-01-13 NOTE — Progress Notes (Signed)
Subjective:     Patient ID: Richard Franco, male   DOB: 09/29/1965, 52 y.o.   MRN: [id number]  HPI     HPI 52 year old male with PMH of pericarditis one year ago of unknown reason who persistently had CP since. Presented to the cardiologist with atypical chest pain, somewhat pleuritic in nature.  Cath was performed that was normal. CTA of the chest was done that revealed 3 <1 cm pulmonary nodules and multiple areas of GGO.  Patient has been more SOB over the past 3 months. On further enquires he states that he noticed shortness of breath over the past several years, at rest and with exertion, associated with chest tightnes. Extensive cardiology evaluation did not reveal a cardiac etiology. In average reports daily symptoms,  2-3 times a day, waking up approximately 2 nights per month with dyspnea.  During hospitalization he was initiated on steroids. He endorsed improved symptoms with steroids. Denies cough or wheezing, but does states that sometimes when he is short of breath he hears a noise coming from his throat.   04/02/2012 Steroids tapered off. He did not not report a significant difference off steroids.   04/16/2012 Was seen by Dr. Melvyn Novas in clinic; CT chest showed persistent lung nodules and ground glass opacities in a different territory distribution. Steroids restarted at 20 mg daily; the patient was instructed to taper them to 10 mg.     04/30/12 The patient on 10 mg of prednisone daily;   05/06/12 Bronchoscopy with BAL, cell count and TBBx of the lingula  BAL cell count with elevated macrophages (possible COP, Hypersensitivity pneumonitis vs chronic microaspiration, possible Aluminum exposure)  RVP neg Viral culture neg  AFB smear neg, culture negative   Cytology/pathology benign with no evidence of aspiration or ILD    05/15/12  Remains on 10 mg steroids;  -methacholine challenge neg -barium swallow WNL  07/02/11 present visit -CT chest with resolution of GGO  -  persistent nodules  -steroids decreased to 5 mg   09/16/12  -CT with no GGO, lung nodules stable  -advised to stop steroids  Complains of shortness of breath at rest and with exertion; chest tightness but no wheezing; Denies cough, sputum production, hemoptysis, fever, chills, rash, PND, orthopnea, LE edema, calf tenderness or LE edema. Has had muscle pain after initiating statins; in the interim resolved.   Since the previous visit he presented to the ED and was found to have elevated troponins heart catheterization negative for CAD. He continues to have chest tightness, initiated cardizem and now reports feeling better.   Acute OV 11/02/12 --  Hx of dyspnea, chest discomfort felt to be coronary vasospasm, GGI on CT chest that resolved as of 08/2012 CT scan. Presents today c/o about 3 weeks of tachycardia w exertion, some cough for ~ a week, usually non-productive, chest tightness. Saw some dark specks in his mucous several times w cough 10/22/12, no over blood, none since then. He is having nasal congestion and allergy sx. No sick contacts. He stopped prednisone in mid March. Of note he has had progressive joint pain in his fingers and hands since stopping the prednisone.   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 No other autoimmune manifestations and no changes in environment. No drug use.   Occupation: toxic exposure to aluminum, steel, plastic dust Travels:no travels abroad TB exposure:denies Pets: none  REC Please start loratadine 10mg  daily  Please start fluticasone  nasal spray, 2 sprays each side twice a day  CXR today  Follow with Dr Chase Caller in 4 weeks to assess your symptoms. You may need to discuss repeating your CT scan of the chest at that time.    OV 01/14/2013   He has multiple issues which include arthritis, fibromyalgia, depression, GERD, anxiety, migraines, and tobacco abuse (quit smoking 9 months  ago). He has had a recent NSTEMI with cath showing normal coronary arteries, EF of 55 to 65%. This was felt to be due to vasospasm. D dimer was positive but CT was negative for PE. He has had a chronically abnormal chest CT. March 2014: shown stable scattered bilateral pulmonary nodules most c/w underlying infectious/inflammatory or granulomatous proces 48mm - 56mm; plan to repeat in 6-12 months which would be Oct 2014/March 2015   Now following with pumonray. Transition of care from Dr Conception Chancy to Dr Chase Caller. Since prior pulmonary visit in the interim Has see rheum Dr Amil Amen recently an dplaced on 10 day prednisone. Is having autoimmune panel repeated by rheumatology This is for diffuse joint pain and some myalgia.  In terms of pulmonary symptoms: Still has chronic early morning chest tightness and not relieved by albuterol. Refrains from NTG use .,.Adiditonally still with moderate dyspnea on exertion, esp for hills at golf. Relieved by rest. Unknown if releived by NTG; refrains. Associated chest ttightness + No wheeze or cough or syncope. STable. Insidious onset of months to years. No PND or orthopnea  [facility]  reports that he quit smoking about 9 months ago. His smoking use included Cigarettes. He has a 27 pack-year smoking history. He has quit using smokeless tobacco. His smokeless tobacco use included Chew.   Labs  normal spirometry oct and  nov 2013 Oct 2013: autoimmne ANA, ACE, ANCA, RF, - negative March 2014 CT chest: clearing GGI but small nodules 7mm x 2  REC Pleae have cardiopulmonary stress tst  Return to see me after that    OV 03/01/2013 Follow up dyspnea and fatigue. He had cardiopulmonary stress test on 02/23/2013 and the results of the lobe and suggest physical deconditioning as a cause for dyspnea and fatigue. He surprised that the result because he says that he does activities of daily living and is doing yard work and also walk in [employer] and other work in [employer].  However, he states that symptoms have been persisting since 2006/2008 and he had a viral illness following exposure to his son with  respiratory viru and who in turn was exposed to a kid with measles. He states that all his dyspnea and fatigue his since then even though he recovered from the acute illness. He recollects being infectious diseases at [facility] and recollects changing diagnoses in 2008. He starts are not available to me but I do note that so the results are mentioned in July 2008. He was finally told that he had parvovirus infection.    also he states that he is known to have low testosterone and prior thyroid issues but does not have endocrinologist on record  CPST 02/23/2013  Procedure: This patient underwent staged symptom-limited exercise testing using a individualized bike protocol with expired gas analysis metabolic evaluation during exercise.  Demographics  Age: 7 Ht. (in.) 72 Wt. (lb) 178 BMI: 27.1 Predicted Peak VO2: 33.6 ml/kg/min  Gender: Male Ht (cm) 172.7 Wt. (kg) 80.7    Results  Pre-Exercise PFTs   FVC 4.87 (101%)   FEV1 3.93 (103%)   FEV1/FVC 81  MVV 162 (107%)  Post-Exercise PFTs (from lowest post-exercise trial (%change from rest))  FVC 4.87 (0%)   FEV1 3.95 (0%)   FEV1/FVC 80%    Exercise Time: 9:24 Watts: 180  RPE: 20  Reason stopped: Patient stopped due to leg fatigue and dyspnea (7/10)  Additional symptoms: Chest discomfort (upper left chest) 4/10  Resting HR: 72 Peak HR: 151 (87% age predicted max HR)  BP rest: 113/78 BP peak: 190/64  Peak VO2: 25.6 (76.2% predicted peak VO2)  VE/VCO2 slope: 31.2  OUES: 1.98  Peak RER: 1.28  Ventilatory Threshold: 11.3 (33.6% predicted peak VO2)  Peak RR 30  Peak Ventilation: 82.8  VE/MVV: 51.2%  PETCO2 at peak: 37  O2pulse: 14 (88% predicted O2pulse)   Interpretation  Notes: Patient gave a very good effort. The pulse-oximetry remained at 96% or greater throughout  the exercise. Exercise was performed on a cycle ergometer starting at [facility] and increasing by 20 W/min.  ECG: Resting ECG in normal sinus rhythm. There was an adequate HR response to the exercise with no ST-T changes or arrhythmias, however patient did not reach age predicted maximal HR. The BP response was appropriate.  PFT: Resting spirometry within normal limits. The MVV was normal. Post-exercise spirometry was performed IPE, 5, 10, 15 and 20 (reported above) minutes of recovery with no significant changes from rest to indicate respiratory muscle fatigue or EIB.  CPX: The RER of 1.28 indicates a maximal effort. The peak VO2 is mildly reduced at 25.6 ml/kg/min (76% of the age/gender/weight matched sedentary norm). The VO2 a the ventilatory threshold was well below normal at 33.6% of the predicted peak VO2. At peak exercise the ventilation was 51% of the measured MVV indicating ventilatory reserve remained (respiratory rate was below the expected range, Vt/IC [90%] was at its limit, PETCO2 was high normal). There was an overall inappropriate HR response to the exercise with a HR reserve of 23 bpm. The O2pulse (a surrogate for stroke volume) increased throughout the exercise reaching 14 ml/beat (88% of predicted). The VE/VCO2 slope was mildly elevated suggesting some reduced ventilatory efficiency. The oxygen uptake efficiency slope (OUES) was mildly reduced and reflects the patient's measured functional capacity.  Conclusion: Exercise testing with gas exchange demonstrates a mild functional limitation when compared to matched sedentary norms. The patient's tidal volume during exercise appears to be a limiting factor, couple with the low respiratory rate. The elevated tidal volume during exercise can induce a sensation of not being able to get a full breath and may cause chest discomfort. There also appears to be significant deconditioning. ^^^Preliminary CPX Results, Finalized  results will be forwarded when completed by interpreting physician.^^^  Prepared by: Linzie Collin, MEd, ACSM-RCEP Sr. Exercise Physiologist 02/23/2013 2:52 PM   REC  Your breathing test suggests physical deconditioning whic could be due to the unexplained viral illness you had several years ago [facility] pulmonary [facility] REfer Dr Chalmers Cater for endocrine causes of fatgue  REturn early Dec 2014 wih CT chest   OV 06/14/2013  FU   Dyspnea/fatigue and lung nodule  Dyspnea/FAigute  - stome improved. Beginning to workout. DR balan has him on LOw T replacement. STill he is not satisfied with level of improvement. Also describing some early moprning chest tightness in front of sternum when he gets up x 1 month. No reprodyucible tenderness. No fever. No chills. Reivew of records indicate that [facility] have started him on prednisone in OCt 2013 but stopped March 2014. This was  for GGO in lung. However, in July 2014 he indicated rheum Dr Amil Amen started him on predinose. Review of med list shows that he is not in it right now  CT chest 06/04/13  IMPRESSION:  1. Stable small, nonspecific pulmonary nodules. Its is these nodules  have remained unchanged since 03/24/2012. The largest nodule  measures 5 mm. If this patient is at low risk for lung cancer then  no further followup is necessary. In a patient with increased risk  for lung cancer documented stability for 18-24 months is advised.  This recommendation follows the consensus statement: Guidelines for  Management of Small Pulmonary Nodules Detected on CT Scans: A  Statement from the [place] as published in Radiology  2005; 237:395-400.  Electronically Signed  By: Kerby Moors M.D.  On: 06/04/2013 13:35          Result Notes    Notes Recorded by Brand Males, MD on 06/09/2013 at 1:02 AM Will disuccc CT results ov 06/14/13. Nodules stable x 1 year. Next Ct in 57months or so     OV  07/19/2014  Chief Complaint  Patient presents with  . Follow-up    Pt stated his breathing is unchanged since last OV. Pt c/o DOE and CP when active. Pt denies cough.    One-year follow-up for pulmonary nodule in association with dyspnea, fatigue and prior smoking history  This past one year he says that he does not have dyspnea anymore or any respiratory symptoms for that matter. However, he continues to have chronic fatigue associated with arthralgia. He has not followed up with his rheumatologist Dr. Amil Amen was previously deemed that he has not found to have any autoimmune disease. He has not followed up with Dr. Chalmers Cater his endocrinologist. He continues on testosterone patch but this does not seem to help him. Overall he is frustrated by situation. Smoking continues to be in remission  I did elicit from him that as a young man he used to work out excessively in the gym lifting weights and doing aerobics. However in the last several years she's not doing this anymore and he is physically deconditioned. It is possible that this is an etiology for his fatigue    OV 11/16/2015  Chief Complaint  Patient presents with  . Acute Visit    Pt c/o some CP with inspiration x 1 week. Pt notes some SOB with exertion but nothing more increased than normal. Pt notes some pressure center chest when laying flat. Denies cough and wheeze.     Not seen him in over one year. Is always had a complex symptomatology for which we never had good reasoning. He now complains about atypical chest pain that is present anteriorly that is constant. Occasionally has relief. It is been going on for 10 days. It is mild to moderate in intensity. It is new. It is different from previous symptoms. It can get worse occasionally when he takes a deep breath. There no clear cut relieving factors. He has chronic shortness of breath and fatigue that has really not resolve. Review of the past medical record shows that he visited GI in  April 2017. They noticed weight loss. If he gets significant acid reflux. Off note he has a strong history of cervical disc disease. He had extensive cervical surgery with fusion of multiple disks over 15 years ago. He has limited range of motion of his neck. He denies any thoracic disc pain. There is no associated wheezing  Exhaled nitric oxide  today in the office i 11 ppb and normal  Is not too keen about having extensive workup for these symptoms.    Chief Complaint  Patient presents with  . Follow-up    Hosp follow up     Referring provider: Chipper Herb, MD  HPI: 52 year old male smoker followed for dyspnea and GGO on CT with initial presentation 2013 ? COP /smoking related  -previously resolved with steroids . Extensive workup for dyspnea.  Seen at [facility] by Dr. Randol Kern 2014.   TEST /Significant events  07/02/11 present visit -CT chest with resolution of GGO  - persistent nodules  -steroids decreased to 5 mg  05/06/12 Bronchoscopy with BAL, cell count and TBBx of the lingula  BAL cell count with elevated macrophages (possible COP, Hypersensitivity pneumonitis vs chronic microaspiration, possible Aluminum exposure)  RVP neg Viral culture neg  AFB smear neg, culture negative  Cytology/pathology benign with no evidence of aspiration or ILD  /22/13 Remains on 10 mg steroids;  -methacholine challenge neg -barium swallow WNL Alpha 1 MS 125   09/16/12  -CT with no GGO, lung nodules stable  Previous cardiopulmonary stress test in 2014 showed deconditioning. 2D echo in 2013 showed grade 1 diastolic dysfunction with EF of 55-60%. Coronary angiography in 2014 showed normal coronary anatomy 04/2016 normal with no restriction or airflow obstruction  2017 Exhaled nitric oxide today in the office i 11 ppb and normal CT chest 01/2016 showed GGO bilaterally greatest in RML/RUL .  CT chest 12/21 showed complete resolution of GGO   07/23/2016 Follow up : GGO on CT -Prob COP (smoking  related) -steroid responsive.  Pt returns for post hospital follow up . Pt was admitted 2 weeks ago for atypical chest pain . Cardiac enzymes were neg. Echo showed nml EF w/ on wall motion abn. Drug screen was positive for Cocaine .  Pt returns today , says since discharge he says chest pain has resolved and no reoccurence . He has not smoking cigarettes. Discussed cessation and cocaine use /dangers./along drug cessation  He says he continues to have no energy .  Denies cough .  He is currently on prednisone 5mg  (slow taper ) for suspected COP . Last CT chest and CXR showed resolution of GGO.  We discussed further taper to off.    OV 12/03/2017  Chief Complaint  Patient presents with  . Acute Visit    Pt has c/o pain in his chest when he breathes which he has noticed x3 months which has become worse. Pt also states he has severe fatigue.    Richard Franco , 52 y.o. , with dob [birth year] and male ,Not Hispanic or Latino from [address] [place] [address] - presents to pulm clinic for ongoing and new issues.  I personally have not seen Richard Franco in nearly 2 years.  At this point in time he tells me that he continues to have ongoing shortness of breath with exertion and ongoing significant fatigue.  These are unchanged to slightly worse over time.  But in the last 3 months he is developed new onset of chest pains that are present with inspiration but also with exertion.  Presents daily on and off it does not wake him up in the middle of the night.  Moderate in intensity.  Review of the chart indicates that in the interim 1 of the urine tox screens during admission was positive for cocaine.  He also had organizing pneumonia that responded to prednisone.  His most recent chest x-ray January 2019 that I personally visualized and is clear.  Currently he says that he continues to smoke cigarettes but denies any cocaine use.  He is his cardiologist cleared him of any cardiac reasons for chest  pains therefore he is in pulmonary clinic today.   OV 01/13/2018  Chief Complaint  Patient presents with  . Follow-up    PFT performed 7/22.  Pt states he has been doing okay. States he still is becoming SOB that is mainly with activities and has pain on the right side of chest. Denies any cough.   Follow-up unexplained dyspnea  Here to review test results. Chest x-ray, pulmonary function test and urine toxicology and blood work are normal. CT angiogram chest in January 2019 was normal. He continues to be symptomatic. He reports spine issues. He had a CT scan of the spine in this year 2019 which shows spinal stenosis and other spinal issues. He does have some nonspecific right infra-axillary pain along with shortness of breath. He is frustrated by symptoms. He does not want referral to a [facility] for a second opinion. He is okay repeatingpulmonary stress test for serial testing  n terms of dyspnea it is a level 0 well at rest or eating brushing teeth or shaving but it is a level II while taking a shower and a level one while dressing on making the bed or doing dishes. Level 4  walking on a level at his own pace. Level 2o while shopping of speaking.. Level #3 for watering the lawn mowing the lawn. Level l#4 for walking up stairs. Level #5 for walking up a hill or sexual activities. Associated with chest tightness and hurting all the time. There is no associated cough    has a past medical history of Abnormal chest CT (03/2012), Anxiety, Arthritis, Chronic midline posterior neck pain, Complication of anesthesia, Depression, Esophageal stricture, Fibromyalgia, Fracture, GERD (gastroesophageal reflux disease), Migraine headache, NSTEMI (non-ST elevated myocardial infarction) ([place]) (07/2012), Testosterone deficiency, and Tobacco abuse.   reports that he quit smoking about 4 weeks ago. His smoking use included cigarettes. He has a 28.00 pack-year smoking history. He has quit using  smokeless tobacco. His smokeless tobacco use included chew.  Past Surgical History:  Procedure Laterality Date  . ANKLE FUSION Left 2006  . BACK SURGERY    . CERVICAL FUSION  X7 "last one 04/2011"  . COLONOSCOPY    . CYSTECTOMY    . FRACTURE SURGERY    . LEFT HEART CATHETERIZATION WITH CORONARY ANGIOGRAM N/A 03/25/2012   Procedure: LEFT HEART CATHETERIZATION WITH CORONARY ANGIOGRAM;  Surgeon: Burnell Blanks, MD;  Location: [facility] CATH LAB;  Service: Cardiovascular;  Laterality: N/A;  . LEFT HEART CATHETERIZATION WITH CORONARY ANGIOGRAM N/A 08/11/2012   Procedure: LEFT HEART CATHETERIZATION WITH CORONARY ANGIOGRAM;  Surgeon: Peter M Martinique, MD;  Location: [facility] CATH LAB;  Service: Cardiovascular;  Laterality: N/A;  . POSTERIOR CERVICAL FUSION/FORAMINOTOMY  05/09/2011   Procedure: POSTERIOR CERVICAL FUSION/FORAMINOTOMY LEVEL 1;  Surgeon: Hosie Spangle;  Location: [facility] NEURO ORS;  Service: Neurosurgery;  Laterality: N/A;  C4/5 posterior arthrodesis with instrumentation   . UPPER GASTROINTESTINAL ENDOSCOPY    . VIDEO BRONCHOSCOPY  05/06/2012   Procedure: VIDEO BRONCHOSCOPY WITH FLUORO;  Surgeon: Jyl Heinz, MD;  Location: [facility] ENDOSCOPY;  Service: Cardiopulmonary;  Laterality: Bilateral;    Allergies  Allergen Reactions  . Midodrine Hcl Swelling    Tongue swelling  . Sulfa Antibiotics Rash  Immunization History  Administered Date(s) Administered  . Influenza Split 04/16/2012  . Influenza,inj,Quad PF,6+ Mos 03/01/2013, 05/08/2015, 05/30/2017  . Influenza,inj,quad, With Preservative 05/02/2016  . Influenza-Unspecified 04/22/2016  . Pneumococcal Polysaccharide-23 04/16/2012    Family History  Problem Relation Age of Onset  . Diabetes Mother   . Coronary artery disease Paternal Uncle   . Asthma Sister   . Colon cancer Neg Hx   . Colon polyps Neg Hx   . Rectal cancer Neg Hx   . Stomach cancer Neg Hx      Current Outpatient Medications:  .  amLODipine (NORVASC) 5 MG  tablet, Take 5 mg by mouth 2 (two) times daily., Disp: , Rfl:  .  aspirin EC 81 MG EC tablet, Take 1 tablet (81 mg total) by mouth daily., Disp: , Rfl:  .  esomeprazole (NEXIUM) 40 MG capsule, TAKE ONE CAPSULE BY MOUTH TWICE A DAY BEFORE A MEAL., Disp: 180 capsule, Rfl: 3 .  FLUoxetine (PROZAC) 20 MG capsule, Take 20 mg by mouth 2 (two) times daily. , Disp: , Rfl:  .  HYDROcodone-acetaminophen (NORCO/VICODIN) 5-325 MG tablet, Take 2 tablets by mouth daily. , Disp: , Rfl:  .  lamoTRIgine (LAMICTAL) 100 MG tablet, Take 150 mg by mouth 2 (two) times daily. , Disp: , Rfl:  .  QUEtiapine (SEROQUEL) 25 MG tablet, Take 25 mg by mouth at bedtime., Disp: , Rfl: 0 .  albuterol (PROVENTIL HFA;VENTOLIN HFA) 108 (90 Base) MCG/ACT inhaler, Inhale 2 puffs into the lungs every 6 (six) hours as needed for wheezing or shortness of breath. (Patient not taking: Reported on 12/03/2017), Disp: 1 Inhaler, Rfl: 6 .  nitroGLYCERIN (NITROSTAT) 0.4 MG SL tablet, DISSOLVE ONE TABLET UNDER THE TONGUE EVERY 5 MINUTES AS NEEDED FOR CHEST PAIN.  DO NOT EXCEED A TOTAL OF 3 DOSES IN 15 MINUTES (Patient not taking: Reported on 12/03/2017), Disp: 25 tablet, Rfl: 6 .  promethazine (PHENERGAN) 25 MG tablet, Take 25 mg by mouth every 6 (six) hours as needed for nausea or vomiting., Disp: , Rfl:    Review of Systems     Objective:   Physical Exam Today's Vitals   01/13/18 0852  BP: 118/84  Pulse: 85  SpO2: 100%  Weight: 189 lb (85.7 kg)  Height: 5\' 9"  (1.753 m)    Estimated body mass index is 27.91 kg/m as calculated from the following:   Height as of this encounter: 5\' 9"  (1.753 m).   Weight as of this encounter: 189 lb (85.7 kg). iscussion only visit    Assessment:       ICD-10-CM   1. Dyspnea on exertion R06.09        Plan:      Persists and unexplained LAb work, cxr and urine tox normal Wondering if  spine issues causing shortness of breath  Plan You do not need CT chest because recent one Jan 2019 was  normal Do CPST bike test with EIB challenge through [facility]  Followup After CPST   (> 50% of this 15 min visit spent in face to face counseling or/and coordination of care by this undersigned MD - Dr Brand Males. This includes one or more of the following documented above: discussion of test results, diagnostic or treatment recommendations, prognosis, risks and benefits of management options, instructions, education, compliance or risk-factor reduction)   Dr. Brand Males, M.D., [facility].C.P Pulmonary and Critical Care Medicine Staff Physician, [place] Director - Interstitial Lung Disease  Program  Pulmonary Fibrosis  [place] at [place], [place], [address]  Pager: [phone], If no answer or between  15:00h - 7:00h: call [phone] Telephone: [phone]

## 2018-01-16 ENCOUNTER — Other Ambulatory Visit: Payer: Self-pay

## 2018-01-16 ENCOUNTER — Encounter (HOSPITAL_COMMUNITY): Payer: Self-pay | Admitting: Emergency Medicine

## 2018-01-16 ENCOUNTER — Emergency Department (HOSPITAL_COMMUNITY)
Admission: EM | Admit: 2018-01-16 | Discharge: 2018-01-16 | Disposition: A | Discharge status: Home / Self Care | Payer: Medicare Other | Attending: Emergency Medicine | Admitting: Emergency Medicine

## 2018-01-16 ENCOUNTER — Emergency Department (HOSPITAL_COMMUNITY): Payer: Medicare Other

## 2018-01-16 ENCOUNTER — Telehealth (HOSPITAL_COMMUNITY): Payer: Self-pay | Admitting: Cardiology

## 2018-01-16 DIAGNOSIS — R0789 Other chest pain: Secondary | ICD-10-CM | POA: Insufficient documentation

## 2018-01-16 DIAGNOSIS — Z7982 Long term (current) use of aspirin: Secondary | ICD-10-CM | POA: Diagnosis not present

## 2018-01-16 DIAGNOSIS — R0602 Shortness of breath: Secondary | ICD-10-CM | POA: Insufficient documentation

## 2018-01-16 DIAGNOSIS — I252 Old myocardial infarction: Secondary | ICD-10-CM | POA: Diagnosis not present

## 2018-01-16 DIAGNOSIS — Z79899 Other long term (current) drug therapy: Secondary | ICD-10-CM | POA: Diagnosis not present

## 2018-01-16 DIAGNOSIS — R079 Chest pain, unspecified: Secondary | ICD-10-CM | POA: Diagnosis not present

## 2018-01-16 DIAGNOSIS — R5383 Other fatigue: Secondary | ICD-10-CM | POA: Diagnosis not present

## 2018-01-16 DIAGNOSIS — Z87891 Personal history of nicotine dependence: Secondary | ICD-10-CM | POA: Insufficient documentation

## 2018-01-16 LAB — BRAIN NATRIURETIC PEPTIDE: B Natriuretic Peptide: 10.9 pg/mL (ref 0.0–100.0)

## 2018-01-16 LAB — I-STAT TROPONIN, ED: Troponin i, poc: 0 ng/mL (ref 0.00–0.08)

## 2018-01-16 LAB — LIPASE, BLOOD: Lipase: 32 U/L (ref 11–51)

## 2018-01-16 LAB — TROPONIN I
Troponin I: <0.03 ng/mL (ref <0.03)
Troponin I: <0.03 ng/mL (ref <0.03)

## 2018-01-16 LAB — RAPID URINE DRUG SCREEN, HOSP PERFORMED
Amphetamines: NOT DETECTED
Barbiturates: NOT DETECTED
Benzodiazepines: NOT DETECTED
Cocaine: NOT DETECTED
Opiates: POSITIVE — AB
Tetrahydrocannabinol: NOT DETECTED

## 2018-01-16 LAB — HEPATIC FUNCTION PANEL
ALT: 21 U/L (ref 0–44)
AST: 20 U/L (ref 15–41)
Albumin: 4.3 g/dL (ref 3.5–5.0)
Alkaline Phosphatase: 61 U/L (ref 38–126)
Bilirubin, Direct: 0.1 mg/dL (ref 0.0–0.2)
Indirect Bilirubin: 0.6 mg/dL (ref 0.3–0.9)
Total Bilirubin: 0.7 mg/dL (ref 0.3–1.2)
Total Protein: 6.8 g/dL (ref 6.5–8.1)

## 2018-01-16 LAB — BASIC METABOLIC PANEL
Anion gap: 11 (ref 5–15)
BUN: 12 mg/dL (ref 6–20)
CO2: 25 mmol/L (ref 22–32)
Calcium: 9.6 mg/dL (ref 8.9–10.3)
Chloride: 104 mmol/L (ref 98–111)
Creatinine, Ser: 1.28 mg/dL — ABNORMAL HIGH (ref 0.61–1.24)
GFR calc Af Amer: >60 mL/min (ref >60)
GFR calc non Af Amer: >60 mL/min (ref >60)
Glucose, Bld: 110 mg/dL — ABNORMAL HIGH (ref 70–99)
Potassium: 3.8 mmol/L (ref 3.5–5.1)
Sodium: 140 mmol/L (ref 135–145)

## 2018-01-16 LAB — CBC
HCT: 44.1 % (ref 39.0–52.0)
Hemoglobin: 14.7 g/dL (ref 13.0–17.0)
MCH: 30.4 pg (ref 26.0–34.0)
MCHC: 33.3 g/dL (ref 30.0–36.0)
MCV: 91.3 fL (ref 78.0–100.0)
Platelets: 215 10*3/uL (ref 150–400)
RBC: 4.83 MIL/uL (ref 4.22–5.81)
RDW: 12.3 % (ref 11.5–15.5)
WBC: 5.8 10*3/uL (ref 4.0–10.5)

## 2018-01-16 LAB — D-DIMER, QUANTITATIVE (NOT AT ARMC): D-Dimer, Quant: 0.45 ug/mL-FEU (ref 0.00–0.50)

## 2018-01-16 MED ORDER — DIAZEPAM 5 MG PO TABS
5.0000 mg | ORAL_TABLET | Freq: Once | ORAL | Status: AC
  Administered 2018-01-16: 5 mg via ORAL
  Filled 2018-01-16: qty 1

## 2018-01-16 MED ORDER — HYDROCODONE-ACETAMINOPHEN 5-325 MG PO TABS: 1.0000 | ORAL_TABLET | Freq: Four times a day (QID) | ORAL | 0 refills | Status: DC | PRN

## 2018-01-16 MED ORDER — OMEPRAZOLE 20 MG PO CPDR: 20.0000 mg | DELAYED_RELEASE_CAPSULE | Freq: Every day | ORAL | 0 refills | Status: DC

## 2018-01-16 MED ORDER — NITROGLYCERIN 0.4 MG SL SUBL
0.4000 mg | SUBLINGUAL_TABLET | SUBLINGUAL | Status: DC | PRN
  Administered 2018-01-16 (×2): 0.4 mg via SUBLINGUAL
  Filled 2018-01-16: qty 1

## 2018-01-16 MED ORDER — GI COCKTAIL ~~LOC~~
30.0000 mL | Freq: Once | ORAL | Status: AC
  Administered 2018-01-16: 30 mL via ORAL
  Filled 2018-01-16: qty 30

## 2018-01-16 MED ORDER — SUCRALFATE 1 G PO TABS: 1.0000 g | ORAL_TABLET | Freq: Three times a day (TID) | ORAL | 0 refills | Status: DC

## 2018-01-16 MED ORDER — METOCLOPRAMIDE HCL 5 MG/ML IJ SOLN
10.0000 mg | Freq: Once | INTRAMUSCULAR | Status: AC
  Administered 2018-01-16: 10 mg via INTRAVENOUS
  Filled 2018-01-16: qty 2

## 2018-01-16 MED ORDER — ASPIRIN 81 MG PO CHEW
243.0000 mg | CHEWABLE_TABLET | Freq: Once | ORAL | Status: AC
  Administered 2018-01-16: 243 mg via ORAL
  Filled 2018-01-16: qty 3

## 2018-01-16 MED ORDER — MORPHINE SULFATE (PF) 4 MG/ML IV SOLN
4.0000 mg | Freq: Once | INTRAVENOUS | Status: AC
  Administered 2018-01-16: 4 mg via INTRAVENOUS
  Filled 2018-01-16: qty 1

## 2018-01-16 MED ORDER — DIPHENHYDRAMINE HCL 50 MG/ML IJ SOLN
25.0000 mg | Freq: Once | INTRAMUSCULAR | Status: AC
  Administered 2018-01-16: 25 mg via INTRAVENOUS
  Filled 2018-01-16: qty 1

## 2018-01-16 MED ORDER — OXYCODONE-ACETAMINOPHEN 5-325 MG PO TABS
2.0000 | ORAL_TABLET | Freq: Once | ORAL | Status: AC
  Administered 2018-01-16: 2 via ORAL
  Filled 2018-01-16: qty 2

## 2018-01-16 NOTE — Telephone Encounter (Signed)
Thank you. Agree.    Legrand Como [address] [place], PA-C 01/16/2018 11:42 AM

## 2018-01-16 NOTE — ED Triage Notes (Signed)
Patient complains of sharp central chest pain and tightness in his jaw that started last night and got worse today. States he took one SL NTG last night with some improvement. Patient alert and oriented at this time.

## 2018-01-16 NOTE — Telephone Encounter (Signed)
Patient called with complaints of active chest pains Pt reports pains started sometime yesterday NTG x 3 no relief + active chest pain during phone call +nausea-sick to his stomach +shakes all over +dizziness  Patient was advised to hang up and call 911 for immediate transport to ER  Patient asked if he could come by private transportation as his girlfriend was home with him, ADVISED he should not transport himself to the ER. He should call EMS as they could arrive to his home ~8-10 mins, and treatment could begin immediately and en route to ER.  Although reluctant, patient reports he will call 911    Will route to Rebecca Eaton for co sign as he was present during phone call

## 2018-01-16 NOTE — ED Notes (Signed)
Pt complaining of pain radiating down his right arm.  Repeat ECG performed and Dr. Myrene Buddy and [place]-RN notified.

## 2018-01-16 NOTE — ED Provider Notes (Signed)
[facility] EMERGENCY DEPARTMENT Provider Note   CSN: [id number] Arrival date & time: 01/16/18  1134     History   Chief Complaint Chief Complaint  Patient presents with  . Chest Pain    HPI Richard Franco is a 52 y.o. male.  HPI   52 year old male with past medical history as below including history of end STEMI, thought possibly due to vasospasm with clean coronaries on back to back catheterizations in 2013 in 2014, chronic dyspnea and chest pain, here with chest pain.  Patient states that starting last night, he developed acute onset of sharp, substernal chest pain.  It is now radiating up towards his jaw with associated pressure-like sensation.  He endorses associated shortness of breath and diaphoresis.  He took nitroglycerin yesterday, which did improve his symptoms, but it has not helped today.  He denies any cough.  Denies any lower extremity swelling.  Per review of records, he has a history of similar complaints and also carries a history of GERD and gastritis with GI related pain.  He states that he has been compliant with his medications.  He took 2 baby aspirin and one aspirin this morning.  No specific alleviating or aggravating factors today.  The pain does not significantly worsened with exertion.  It does seem to be somewhat worse when he takes a deep breath in.  Past Medical History:  Diagnosis Date  . Abnormal chest CT 03/2012   a. 2013: Scattered patchy ground glass opacities and pulmonary nodules. Transbronchial biopsy with no definite etiology of lung finding- possible organizing pneumonia. b. F/u CT 05/2014: stable multiple tiny pulm nodules, no further w/u recommended per notes.   . Anxiety   . Arthritis   . Chronic midline posterior neck pain   . Complication of anesthesia    " difficult to urinate after "  . Depression   . Esophageal stricture   . Fibromyalgia   . Fracture    back  . GERD (gastroesophageal reflux disease)   .  Migraine headache    "none lately; I've had alot in my life" (06/21/2015)  . NSTEMI (non-ST elevated myocardial infarction) ([place]) 07/2012   a. Normal cath 03/2012. b. 07/2012: troponin 4, normal cors, ?vasospasm. Did not tolerate Imdur due to headache. On amlodipine.  . Testosterone deficiency   . Tobacco abuse     Patient Active Problem List   Diagnosis Date Noted  . Throat swelling 07/06/2016  . Cocaine use 07/06/2016  . ETOH abuse 07/06/2016  . Dyspnea on exertion 07/04/2016  . Chest pain 07/04/2016  . Essential hypertension 07/04/2016  . AKI (acute kidney injury) ([place]) 07/04/2016  . Chest pain, atypical 11/16/2015  . Pleuritic chest pain 06/22/2015  . Tobacco abuse 06/22/2015  . History of coronary vasospasm 06/22/2015  . Smoking history 07/19/2014  . Chronic fatigue 07/19/2014  . Chronic arthralgias of knees and hips 11/26/2012  . Hyperlipidemia 09/25/2012  . GERD (gastroesophageal reflux disease) 08/11/2012  . BPH (benign prostatic hyperplasia) 08/11/2012  . Abnormal immunological finding in serum 05/03/2012  . Pulmonary infiltrates 04/16/2012  . Pulmonary nodule 03/25/2012  . Abnormal CT lung screening 03/25/2012  . History of tobacco abuse 02/18/2012  . Fibromyalgia 02/18/2012  . Hx of migraine headaches 02/18/2012  . DYSPNEA 08/24/2009  . CHEST PAIN-UNSPECIFIED 08/24/2009  . ABFND, FALSE POSITIVE SEROLOGIC TEST, [facility] 01/12/2007  . OSTEOARTHRITIS 01/05/2007    Past Surgical History:  Procedure Laterality Date  . ANKLE FUSION Left 2006  .  BACK SURGERY    . CERVICAL FUSION  X7 "last one 04/2011"  . COLONOSCOPY    . CYSTECTOMY    . FRACTURE SURGERY    . LEFT HEART CATHETERIZATION WITH CORONARY ANGIOGRAM N/A 03/25/2012   Procedure: LEFT HEART CATHETERIZATION WITH CORONARY ANGIOGRAM;  Surgeon: Burnell Blanks, MD;  Location: [facility] CATH LAB;  Service: Cardiovascular;  Laterality: N/A;  . LEFT HEART CATHETERIZATION WITH CORONARY ANGIOGRAM N/A 08/11/2012    Procedure: LEFT HEART CATHETERIZATION WITH CORONARY ANGIOGRAM;  Surgeon: Peter M Martinique, MD;  Location: [facility] CATH LAB;  Service: Cardiovascular;  Laterality: N/A;  . POSTERIOR CERVICAL FUSION/FORAMINOTOMY  05/09/2011   Procedure: POSTERIOR CERVICAL FUSION/FORAMINOTOMY LEVEL 1;  Surgeon: Hosie Spangle;  Location: [facility] NEURO ORS;  Service: Neurosurgery;  Laterality: N/A;  C4/5 posterior arthrodesis with instrumentation   . UPPER GASTROINTESTINAL ENDOSCOPY    . VIDEO BRONCHOSCOPY  05/06/2012   Procedure: VIDEO BRONCHOSCOPY WITH FLUORO;  Surgeon: Jyl Heinz, MD;  Location: [facility] ENDOSCOPY;  Service: Cardiopulmonary;  Laterality: Bilateral;        Home Medications    Prior to Admission medications   Medication Sig Start Date End Date Taking? Authorizing Provider  amLODipine (NORVASC) 5 MG tablet Take 5 mg by mouth 2 (two) times daily.   Yes [provider]  aspirin EC 81 MG EC tablet Take 1 tablet (81 mg total) by mouth daily. 08/12/12  Yes Dunn, Dayna N, PA-C  Cal Carb-Mag Hydrox-Simeth (ROLAIDS ADVANCED PO) Take 2 tablets by mouth as needed (indigestion).   Yes [provider]  carboxymethylcellulose (REFRESH PLUS) 0.5 % SOLN Place 1 drop into both eyes daily as needed (dry eyes).   Yes [provider]  esomeprazole (NEXIUM) 40 MG capsule TAKE ONE CAPSULE BY MOUTH TWICE A DAY BEFORE A MEAL. 03/24/17  Yes Nandigam, Venia Minks, MD  FLUoxetine (PROZAC) 20 MG capsule Take 20 mg by mouth 2 (two) times daily.    Yes [provider]  ibuprofen (ADVIL,MOTRIN) 200 MG tablet Take 600-800 mg by mouth every 6 (six) hours as needed for moderate pain.   Yes [provider]  lamoTRIgine (LAMICTAL) 100 MG tablet Take 150 mg by mouth 2 (two) times daily.    Yes [provider]  nitroGLYCERIN (NITROSTAT) 0.4 MG SL tablet DISSOLVE ONE TABLET UNDER THE TONGUE EVERY 5 MINUTES AS NEEDED FOR CHEST PAIN.  DO NOT EXCEED A TOTAL OF 3 DOSES IN 15 MINUTES 10/09/15  Yes  Larey Dresser, MD  promethazine (PHENERGAN) 25 MG tablet Take 25 mg by mouth every 6 (six) hours as needed for nausea or vomiting.   Yes [provider]  QUEtiapine (SEROQUEL) 25 MG tablet Take 25 mg by mouth at bedtime. 11/04/17  Yes [provider]  albuterol (PROVENTIL HFA;VENTOLIN HFA) 108 (90 Base) MCG/ACT inhaler Inhale 2 puffs into the lungs every 6 (six) hours as needed for wheezing or shortness of breath. Patient not taking: Reported on 12/03/2017 04/25/16   Parrett, Fonnie Mu, NP  HYDROcodone-acetaminophen (NORCO/VICODIN) 5-325 MG tablet Take 1-2 tablets by mouth every 6 (six) hours as needed for moderate pain or severe pain. 01/16/18   Duffy Bruce, MD  omeprazole (PRILOSEC) 20 MG capsule Take 1 capsule (20 mg total) by mouth daily for 14 days. 01/16/18 01/30/18  Duffy Bruce, MD  sucralfate (CARAFATE) 1 g tablet Take 1 tablet (1 g total) by mouth 4 (four) times daily -  with meals and at bedtime for 5 days. 01/16/18 01/21/18  Duffy Bruce,  MD    Family History Family History  Problem Relation Age of Onset  . Diabetes Mother   . Coronary artery disease Paternal Uncle   . Asthma Sister   . Colon cancer Neg Hx   . Colon polyps Neg Hx   . Rectal cancer Neg Hx   . Stomach cancer Neg Hx     Social History Social History   Tobacco Use  . Smoking status: Former Smoker    Packs/day: 1.00    Years: 28.00    Pack years: 28.00    Types: Cigarettes    Last attempt to quit: 12/16/2017    Years since quitting: 0.0  . Smokeless tobacco: Former Systems developer    Types: Chew  Substance Use Topics  . Alcohol use: No    Alcohol/week: 12.6 oz    Types: 21 Cans of beer per week    Comment: 06/21/2015 "3 beers/day lately; more lately cause of being separated"   . Drug use: No     Allergies   Midodrine hcl and Sulfa antibiotics   Review of Systems Review of Systems  Constitutional: Positive for fatigue. Negative for chills and fever.  HENT: Negative for congestion and  rhinorrhea.   Eyes: Negative for visual disturbance.  Respiratory: Positive for chest tightness and shortness of breath. Negative for cough and wheezing.   Cardiovascular: Positive for chest pain. Negative for leg swelling.  Gastrointestinal: Negative for abdominal pain, diarrhea, nausea and vomiting.  Genitourinary: Negative for dysuria and flank pain.  Musculoskeletal: Negative for neck pain and neck stiffness.  Skin: Negative for rash and wound.  Allergic/Immunologic: Negative for immunocompromised state.  Neurological: Negative for syncope, weakness and headaches.  All other systems reviewed and are negative.    Physical Exam Updated Vital Signs BP 123/88   Pulse 88   Temp 98.1 F (36.7 C) (Oral)   Resp 10   Ht 5\' 9"  (1.753 m)   Wt 85.7 kg (189 lb)   SpO2 99%   BMI 27.91 kg/m   Physical Exam  Constitutional: He is oriented to person, place, and time. He appears well-developed and well-nourished. No distress.  HENT:  Head: Normocephalic and atraumatic.  Eyes: Conjunctivae are normal.  Neck: Neck supple.  Cardiovascular: Normal rate, regular rhythm and normal heart sounds. Exam reveals no friction rub.  No murmur heard. Pulmonary/Chest: Effort normal and breath sounds normal. No respiratory distress. He has no wheezes. He has no rales.  Abdominal: He exhibits no distension.  Musculoskeletal: He exhibits no edema.  Neurological: He is alert and oriented to person, place, and time. He exhibits normal muscle tone.  Skin: Skin is warm. Capillary refill takes less than 2 seconds.  Psychiatric: His mood appears anxious.  Nursing note and vitals reviewed.    ED Treatments / Results  Labs (all labs ordered are listed, but only abnormal results are displayed) Labs Reviewed  BASIC METABOLIC PANEL - Abnormal; Notable for the following components:      Result Value   Glucose, Bld 110 (*)    Creatinine, Ser 1.28 (*)    All other components within normal limits  RAPID URINE  DRUG SCREEN, HOSP PERFORMED - Abnormal; Notable for the following components:   Opiates POSITIVE (*)    All other components within normal limits  CBC  TROPONIN I  BRAIN NATRIURETIC PEPTIDE  LIPASE, BLOOD  HEPATIC FUNCTION PANEL  D-DIMER, QUANTITATIVE (NOT AT [facility])  TROPONIN I  I-STAT TROPONIN, ED    EKG EKG Interpretation  Date/Time:  Friday January 16 2018 11:37:12 EDT Ventricular Rate:  114 PR Interval:  144 QRS Duration: 80 QT Interval:  336 QTC Calculation: 463 R Axis:   88 Text Interpretation:  Sinus tachycardia Cannot rule out Inferior infarct , age undetermined Abnormal ECG Nonspecific T wave abnormality since last tracing no significant change Confirmed by Noemi Chapel [phone]) on 01/16/2018 11:53:15 AM   Radiology Dg Chest 2 View  Result Date: 01/16/2018 CLINICAL DATA:  Chest pain EXAM: CHEST - 2 VIEW COMPARISON:  December 03, 2017 FINDINGS: There is no appreciable edema or consolidation. Heart size and pulmonary vascularity are normal. No adenopathy. There is a monitor lead overlying the right upper chest. There is postoperative change in the lower cervical region. No pneumothorax. IMPRESSION: No edema or consolidation. Electronically Signed   By: Lowella Grip III M.D.   On: 01/16/2018 12:01    Procedures Procedures (including critical care time)  Medications Ordered in ED Medications  morphine 4 MG/ML injection 4 mg (4 mg Intravenous Given 01/16/18 1236)  aspirin chewable tablet 243 mg (243 mg Oral Given 01/16/18 1236)  gi cocktail (Maalox,Lidocaine,Donnatal) (30 mLs Oral Given 01/16/18 1241)  metoCLOPramide (REGLAN) injection 10 mg (10 mg Intravenous Given 01/16/18 1501)  diphenhydrAMINE (BENADRYL) injection 25 mg (25 mg Intravenous Given 01/16/18 1501)  diazepam (VALIUM) tablet 5 mg (5 mg Oral Given 01/16/18 1500)  oxyCODONE-acetaminophen (PERCOCET/ROXICET) 5-325 MG per tablet 2 tablet (2 tablets Oral Given 01/16/18 1721)     Initial Impression / Assessment and Plan  / ED Course  I have reviewed the triage vital signs and the nursing notes.  Pertinent labs & imaging results that were available during my care of the patient were reviewed by me and considered in my medical decision making (see chart for details).  Clinical Course as of Jan 16 2129  Fri Jan 17, 5868  [id number] 52 year old male with history of recurrent chest pain and previous and STEMI thought secondary to coronary vasospasm here with recurrent chest pain.  Pain has both typical and atypical elements, but seems consistent with his previous episodes.  Last cath was in 2014.  Will plan for at least delta troponin, will also check labs to rule out PE or underlying pulmonary abnormality.  Chest x-ray unremarkable.  D-dimer negative, doubt PE or dissection.  Initial i-STAT troponin negative.  Patient with no relief by nitroglycerin, will trial GI cocktail given his history of gastritis.  Abdomen is otherwise soft.   [CI]  1434 Initial labs are all reassuring.  Troponin negative.  BNP at baseline.  D-dimer negative as mentioned.  On further review and discussion with the patient, it does seem that his symptoms seem to be related to eating, particularly when eating spicy meals or meats.  Given that it does occasionally respond to nitroglycerin and did respond to calcium blocker in the past, history is actually potentially more consistent with GERD/gastritis with esophageal spasm.  Will trial Reglan as well as a dose of p.o. Valium, plan for delta troponin given his otherwise reassuring labs, EKG, and negative cardiac work-ups, and hopefully discharge if feeling better.   [CI]  1706 Repeat trop remains undetectable, doubt ACS. Pain improved with meds for GI etiology. Will start on tx for gerd/gastritis, refer to GI given otherwise negative pulmonary and cardiac w/u.   [CI]    Clinical Course User Index [CI] Duffy Bruce, MD    Final Clinical Impressions(s) / ED Diagnoses   Final diagnoses:  Atypical  chest pain    ED  Discharge Orders        Ordered    sucralfate (CARAFATE) 1 g tablet  3 times daily with meals & bedtime     01/16/18 1720    omeprazole (PRILOSEC) 20 MG capsule  Daily     01/16/18 1720    HYDROcodone-acetaminophen (NORCO/VICODIN) 5-325 MG tablet  Every 6 hours PRN     01/16/18 Kris Mouton, MD 01/16/18 2130

## 2018-01-27 ENCOUNTER — Encounter (HOSPITAL_COMMUNITY): Payer: Medicare Other

## 2018-02-09 ENCOUNTER — Ambulatory Visit: Payer: Medicare Other | Admitting: Internal Medicine

## 2018-02-10 ENCOUNTER — Encounter (HOSPITAL_COMMUNITY): Payer: Medicare Other

## 2018-02-13 ENCOUNTER — Ambulatory Visit: Payer: Medicare Other | Admitting: Internal Medicine

## 2018-02-24 ENCOUNTER — Encounter (HOSPITAL_COMMUNITY): Payer: Medicare Other

## 2018-02-28 ENCOUNTER — Other Ambulatory Visit: Payer: Self-pay | Admitting: Gastroenterology

## 2018-03-06 DIAGNOSIS — M542 Cervicalgia: Secondary | ICD-10-CM | POA: Diagnosis not present

## 2018-03-06 DIAGNOSIS — R51 Headache: Secondary | ICD-10-CM | POA: Diagnosis not present

## 2018-03-06 DIAGNOSIS — M4722 Other spondylosis with radiculopathy, cervical region: Secondary | ICD-10-CM | POA: Diagnosis not present

## 2018-03-06 DIAGNOSIS — Z6828 Body mass index (BMI) 28.0-28.9, adult: Secondary | ICD-10-CM | POA: Diagnosis not present

## 2018-03-11 DIAGNOSIS — G894 Chronic pain syndrome: Secondary | ICD-10-CM | POA: Diagnosis not present

## 2018-03-11 DIAGNOSIS — M4722 Other spondylosis with radiculopathy, cervical region: Secondary | ICD-10-CM | POA: Diagnosis not present

## 2018-03-11 DIAGNOSIS — F4542 Pain disorder with related psychological factors: Secondary | ICD-10-CM | POA: Diagnosis not present

## 2018-03-19 ENCOUNTER — Other Ambulatory Visit (HOSPITAL_COMMUNITY): Payer: Self-pay

## 2018-03-19 DIAGNOSIS — M4722 Other spondylosis with radiculopathy, cervical region: Secondary | ICD-10-CM | POA: Diagnosis not present

## 2018-03-19 DIAGNOSIS — Z6828 Body mass index (BMI) 28.0-28.9, adult: Secondary | ICD-10-CM | POA: Diagnosis not present

## 2018-03-19 DIAGNOSIS — R51 Headache: Secondary | ICD-10-CM | POA: Diagnosis not present

## 2018-03-19 MED ORDER — NITROGLYCERIN 0.4 MG SL SUBL: SUBLINGUAL_TABLET | SUBLINGUAL | 0 refills | Status: DC

## 2018-03-22 ENCOUNTER — Other Ambulatory Visit: Payer: Self-pay | Admitting: Gastroenterology

## 2018-03-23 ENCOUNTER — Telehealth: Payer: Self-pay | Admitting: Gastroenterology

## 2018-03-23 MED ORDER — ESOMEPRAZOLE MAGNESIUM 40 MG PO CPDR: DELAYED_RELEASE_CAPSULE | ORAL | 3 refills | Status: DC

## 2018-03-23 NOTE — Telephone Encounter (Signed)
Refilled Nexium for patient but explained to him that he needed to be seen in the office before any further refills  He said he would call back and schedule a follow up , has not been seen in 2 years

## 2018-05-01 DIAGNOSIS — M4722 Other spondylosis with radiculopathy, cervical region: Secondary | ICD-10-CM | POA: Diagnosis not present

## 2018-05-01 DIAGNOSIS — M961 Postlaminectomy syndrome, not elsewhere classified: Secondary | ICD-10-CM | POA: Diagnosis not present

## 2018-05-01 DIAGNOSIS — G894 Chronic pain syndrome: Secondary | ICD-10-CM | POA: Diagnosis not present

## 2018-05-07 DIAGNOSIS — I10 Essential (primary) hypertension: Secondary | ICD-10-CM | POA: Diagnosis not present

## 2018-05-07 DIAGNOSIS — R51 Headache: Secondary | ICD-10-CM | POA: Diagnosis not present

## 2018-05-07 DIAGNOSIS — M542 Cervicalgia: Secondary | ICD-10-CM | POA: Diagnosis not present

## 2018-05-07 DIAGNOSIS — Z6828 Body mass index (BMI) 28.0-28.9, adult: Secondary | ICD-10-CM | POA: Diagnosis not present

## 2018-05-12 DIAGNOSIS — R0989 Other specified symptoms and signs involving the circulatory and respiratory systems: Secondary | ICD-10-CM | POA: Diagnosis not present

## 2018-05-14 DIAGNOSIS — K219 Gastro-esophageal reflux disease without esophagitis: Secondary | ICD-10-CM | POA: Diagnosis not present

## 2018-05-14 DIAGNOSIS — Z87891 Personal history of nicotine dependence: Secondary | ICD-10-CM | POA: Diagnosis not present

## 2018-05-14 DIAGNOSIS — J343 Hypertrophy of nasal turbinates: Secondary | ICD-10-CM | POA: Diagnosis not present

## 2018-06-02 DIAGNOSIS — Z23 Encounter for immunization: Secondary | ICD-10-CM | POA: Diagnosis not present

## 2018-06-02 DIAGNOSIS — S0101XA Laceration without foreign body of scalp, initial encounter: Secondary | ICD-10-CM | POA: Diagnosis not present

## 2018-06-02 DIAGNOSIS — R51 Headache: Secondary | ICD-10-CM | POA: Diagnosis not present

## 2018-06-02 DIAGNOSIS — M961 Postlaminectomy syndrome, not elsewhere classified: Secondary | ICD-10-CM | POA: Diagnosis not present

## 2018-06-09 DIAGNOSIS — S0101XD Laceration without foreign body of scalp, subsequent encounter: Secondary | ICD-10-CM | POA: Diagnosis not present

## 2018-06-09 DIAGNOSIS — Z4802 Encounter for removal of sutures: Secondary | ICD-10-CM | POA: Diagnosis not present

## 2018-06-26 DIAGNOSIS — M791 Myalgia, unspecified site: Secondary | ICD-10-CM | POA: Diagnosis not present

## 2018-06-26 DIAGNOSIS — J029 Acute pharyngitis, unspecified: Secondary | ICD-10-CM | POA: Diagnosis not present

## 2018-06-26 DIAGNOSIS — R509 Fever, unspecified: Secondary | ICD-10-CM | POA: Diagnosis not present

## 2018-06-26 DIAGNOSIS — R5383 Other fatigue: Secondary | ICD-10-CM | POA: Diagnosis not present

## 2018-06-26 DIAGNOSIS — H66002 Acute suppurative otitis media without spontaneous rupture of ear drum, left ear: Secondary | ICD-10-CM | POA: Diagnosis not present

## 2018-07-28 ENCOUNTER — Ambulatory Visit: Payer: Medicare Other | Admitting: Physician Assistant

## 2018-07-30 ENCOUNTER — Telehealth (HOSPITAL_COMMUNITY): Payer: Self-pay | Admitting: *Deleted

## 2018-07-30 ENCOUNTER — Telehealth: Payer: Self-pay | Admitting: Adult Health

## 2018-07-30 ENCOUNTER — Ambulatory Visit (HOSPITAL_COMMUNITY): Payer: Medicare Other | Admitting: Cardiology

## 2018-07-30 MED ORDER — NITROGLYCERIN 0.4 MG SL SUBL: SUBLINGUAL_TABLET | SUBLINGUAL | 11 refills | Status: DC

## 2018-07-30 NOTE — Telephone Encounter (Signed)
New message    *STAT* If patient is at the pharmacy, call can be transferred to refill team.   1. Which medications need to be refilled? (please list name of each medication and dose if known) nitroGLYCERIN (NITROSTAT) 0.4 MG SL tablet  2. Which pharmacy/location (including street and city if local pharmacy) is medication to be sent to?[id number] - [place], [place] - [address]   3. Do they need a 30 day or 90 day supply? 30 Pt seen PA Jory Sims 2019 Pt stated that he's been hurting today

## 2018-07-30 NOTE — Telephone Encounter (Signed)
Pt left VM on our triage line requesting a refill of his ntg.  Pt has never been seen in our office and has not seen Dr Aundra Dubin since 2015, he has been seeing APP with [facility] but has not been seen by them since April 2019.  Pt will need to est with new cardiologist as Dr Aundra Dubin only does HF now, attempted call pt and had to leave a VM asking him to call our office back.

## 2018-08-06 DIAGNOSIS — I10 Essential (primary) hypertension: Secondary | ICD-10-CM | POA: Diagnosis not present

## 2018-08-06 DIAGNOSIS — Z6827 Body mass index (BMI) 27.0-27.9, adult: Secondary | ICD-10-CM | POA: Diagnosis not present

## 2018-08-06 DIAGNOSIS — M961 Postlaminectomy syndrome, not elsewhere classified: Secondary | ICD-10-CM | POA: Diagnosis not present

## 2018-08-06 DIAGNOSIS — R51 Headache: Secondary | ICD-10-CM | POA: Diagnosis not present

## 2018-08-21 ENCOUNTER — Ambulatory Visit: Payer: Medicare Other | Admitting: Primary Care

## 2018-08-21 NOTE — Progress Notes (Deleted)
@Patient  ID: Richard Franco, male    DOB: 1966-02-01, 53 y.o.   MRN: [id number]  No chief complaint on file.   Referring provider: Anne Shutter  HPI: 53 year old male, former smoker quit in 2019 (28 pack year hx). PMH significant for pulmonary infiltrates, hypertension, GERD,pericarditis, NSTEMI with normal normal cath. Chronically abnormal CT chest.   Hx Atypical chest pain, somewhat pleuritic in nature. Extensive cardiology evaluation did not reveal abnormalities. CTA in Oct 2013 revealed three <1cm pulmonary nodules and multiple areas of ground glass. More short of breath over the last several years. Repeat CT chest Oct 2013 showed persistent lung nodules and ground glass opacities in different territory distribution. Steroids re-started at 20mg  daily and tapered to 10mg . Bronchoscopy with BAL cell count with elevated macrophages (possible COP, HP vs chronic microaspiration, positive aluminum exposure). AFB negative. Cytology/pathology benign with no evidence of aspiration or ILD. Barium swallow wnl. Methacholine challenge negative. CT chest in 2014 showed no GGO, lung nodules stable. Steriod's stopped.   08/21/2018 Patient presents today for 6 month follow-up visit.   Allergies  Allergen Reactions  . Midodrine Hcl Swelling    Tongue swelling  . Sulfa Antibiotics Rash    Immunization History  Administered Date(s) Administered  . Influenza Split 04/16/2012  . Influenza,inj,Quad PF,6+ Mos 03/01/2013, 05/08/2015, 05/30/2017  . Influenza,inj,quad, With Preservative 05/02/2016  . Influenza-Unspecified 04/22/2016  . Pneumococcal Polysaccharide-23 04/16/2012    Past Medical History:  Diagnosis Date  . Abnormal chest CT 03/2012   a. 2013: Scattered patchy ground glass opacities and pulmonary nodules. Transbronchial biopsy with no definite etiology of lung finding- possible organizing pneumonia. b. F/u CT 05/2014: stable multiple tiny pulm nodules, no further w/u recommended  per notes.   . Anxiety   . Arthritis   . CAD (coronary artery disease)   . Chronic midline posterior neck pain   . Complication of anesthesia    " difficult to urinate after "  . Depression   . Diabetes ([place])   . Diabetic retinopathy ([place])   . Esophageal stricture   . Fibromyalgia   . Fracture    back  . GERD (gastroesophageal reflux disease)   . Hypercholesteremia   . Hypertension   . Migraine headache    "none lately; I've had alot in my life" (06/21/2015)  . NSTEMI (non-ST elevated myocardial infarction) ([place]) 07/2012   a. Normal cath 03/2012. b. 07/2012: troponin 4, normal cors, ?vasospasm. Did not tolerate Imdur due to headache. On amlodipine.  . Stroke (cerebrum) ([place])   . Testosterone deficiency   . Tobacco abuse     Tobacco History: Social History   Tobacco Use  Smoking Status Former Smoker  . Packs/day: 1.00  . Years: 28.00  . Pack years: 28.00  . Types: Cigarettes  . Last attempt to quit: 12/16/2017  . Years since quitting: 0.6  Smokeless Tobacco Former Systems developer  . Types: Chew   Counseling given: Not Answered   Outpatient Medications Prior to Visit  Medication Sig Dispense Refill  . albuterol (PROVENTIL HFA;VENTOLIN HFA) 108 (90 Base) MCG/ACT inhaler Inhale 2 puffs into the lungs every 6 (six) hours as needed for wheezing or shortness of breath. (Patient not taking: Reported on 12/03/2017) 1 Inhaler 6  . amLODipine (NORVASC) 5 MG tablet Take 5 mg by mouth 2 (two) times daily.    Marland Kitchen aspirin EC 81 MG EC tablet Take 1 tablet (81 mg total) by mouth daily.    . Cal Carb-Mag Hydrox-Simeth (ROLAIDS  ADVANCED PO) Take 2 tablets by mouth as needed (indigestion).    . carboxymethylcellulose (REFRESH PLUS) 0.5 % SOLN Place 1 drop into both eyes daily as needed (dry eyes).    Marland Kitchen esomeprazole (NEXIUM) 40 MG capsule TAKE ONE CAPSULE BY MOUTH TWICE A DAY BEFORE A MEAL. 180 capsule 3  . FLUoxetine (PROZAC) 20 MG capsule Take 20 mg by mouth 2 (two) times daily.     Marland Kitchen  HYDROcodone-acetaminophen (NORCO/VICODIN) 5-325 MG tablet Take 1-2 tablets by mouth every 6 (six) hours as needed for moderate pain or severe pain. 10 tablet 0  . ibuprofen (ADVIL,MOTRIN) 200 MG tablet Take 600-800 mg by mouth every 6 (six) hours as needed for moderate pain.    Marland Kitchen lamoTRIgine (LAMICTAL) 100 MG tablet Take 150 mg by mouth 2 (two) times daily.     . nitroGLYCERIN (NITROSTAT) 0.4 MG SL tablet DISSOLVE ONE TABLET UNDER THE TONGUE EVERY 5 MINUTES AS NEEDED FOR CHEST PAIN.  DO NOT EXCEED A TOTAL OF 3 DOSES IN 15 MINUTES 25 tablet 11  . omeprazole (PRILOSEC) 20 MG capsule Take 1 capsule (20 mg total) by mouth daily for 14 days. 14 capsule 0  . promethazine (PHENERGAN) 25 MG tablet Take 25 mg by mouth every 6 (six) hours as needed for nausea or vomiting.    Marland Kitchen QUEtiapine (SEROQUEL) 25 MG tablet Take 25 mg by mouth at bedtime.  0  . sucralfate (CARAFATE) 1 g tablet Take 1 tablet (1 g total) by mouth 4 (four) times daily -  with meals and at bedtime for 5 days. 20 tablet 0   No facility-administered medications prior to visit.       Review of Systems  Review of Systems   Physical Exam  There were no vitals taken for this visit. Physical Exam   Lab Results:  CBC    Component Value Date/Time   WBC 5.8 01/16/2018 1149   RBC 4.83 01/16/2018 1149   HGB 14.7 01/16/2018 1149   HGB 15.0 09/18/2015 0845   HCT 44.1 01/16/2018 1149   HCT 44.3 09/18/2015 0845   PLT 215 01/16/2018 1149   PLT 235 09/18/2015 0845   MCV 91.3 01/16/2018 1149   MCV 93 09/18/2015 0845   MCH 30.4 01/16/2018 1149   MCHC 33.3 01/16/2018 1149   RDW 12.3 01/16/2018 1149   RDW 14.1 09/18/2015 0845   LYMPHSABS 1.1 12/03/2017 1121   LYMPHSABS 1.3 09/18/2015 0845   MONOABS 0.4 12/03/2017 1121   EOSABS 0.1 12/03/2017 1121   EOSABS 0.2 09/18/2015 0845   BASOSABS 0.0 12/03/2017 1121   BASOSABS 0.0 09/18/2015 0845    BMET    Component Value Date/Time   NA 140 01/16/2018 1149   NA 142 09/18/2015 0845   K  3.8 01/16/2018 1149   CL 104 01/16/2018 1149   CO2 25 01/16/2018 1149   GLUCOSE 110 (H) 01/16/2018 1149   BUN 12 01/16/2018 1149   BUN 11 09/18/2015 0845   CREATININE 1.28 (H) 01/16/2018 1149   CREATININE 1.14 03/24/2012 1646   CALCIUM 9.6 01/16/2018 1149   GFRNONAA >60 01/16/2018 1149   GFRAA >60 01/16/2018 1149    BNP    Component Value Date/Time   BNP 10.9 01/16/2018 1236    ProBNP    Component Value Date/Time   PROBNP 4.0 05/05/2012 1105    Imaging: No results found.   Assessment & Plan:   No problem-specific Assessment & Plan notes found for this encounter.     Benjamine Mola  Teresita Madura, NP 08/21/2018

## 2018-08-24 ENCOUNTER — Ambulatory Visit: Payer: Medicare Other | Admitting: Gastroenterology

## 2018-08-26 ENCOUNTER — Encounter: Payer: Self-pay | Admitting: Primary Care

## 2018-08-26 ENCOUNTER — Ambulatory Visit (INDEPENDENT_AMBULATORY_CARE_PROVIDER_SITE_OTHER): Payer: Medicare Other | Admitting: Primary Care

## 2018-08-26 VITALS — BP 118/88 | HR 83 | Ht 69.0 in | Wt 183.2 lb

## 2018-08-26 DIAGNOSIS — R0781 Pleurodynia: Secondary | ICD-10-CM

## 2018-08-26 DIAGNOSIS — R911 Solitary pulmonary nodule: Secondary | ICD-10-CM

## 2018-08-26 LAB — SEDIMENTATION RATE: Sed Rate: 4 mm/hr (ref 0–20)

## 2018-08-26 LAB — CBC WITH DIFFERENTIAL/PLATELET
Basophils Absolute: 0 10*3/uL (ref 0.0–0.1)
Basophils Relative: 0.4 % (ref 0.0–3.0)
Eosinophils Absolute: 0.1 10*3/uL (ref 0.0–0.7)
Eosinophils Relative: 1 % (ref 0.0–5.0)
HCT: 42.6 % (ref 39.0–52.0)
Hemoglobin: 14.5 g/dL (ref 13.0–17.0)
Lymphocytes Relative: 21.5 % (ref 12.0–46.0)
Lymphs Abs: 1.2 10*3/uL (ref 0.7–4.0)
MCHC: 34.1 g/dL (ref 30.0–36.0)
MCV: 88.8 fl (ref 78.0–100.0)
Monocytes Absolute: 0.4 10*3/uL (ref 0.1–1.0)
Monocytes Relative: 7.4 % (ref 3.0–12.0)
Neutro Abs: 3.8 10*3/uL (ref 1.4–7.7)
Neutrophils Relative %: 69.7 % (ref 43.0–77.0)
Platelets: 194 10*3/uL (ref 150.0–400.0)
RBC: 4.8 Mil/uL (ref 4.22–5.81)
RDW: 14 % (ref 11.5–15.5)
WBC: 5.4 10*3/uL (ref 4.0–10.5)

## 2018-08-26 NOTE — Assessment & Plan Note (Signed)
-   Intermittent chest pain for 2-3 months, worse with deep breath/cough  - Non-cardiac in nature, likely pleuritic or esophageal  - Hx pulmonary nodules and GGO - Needs repeat CT chest w/o contrast and labs (Sed rate/CBC) - Recommend GI follow-up  - If symptoms persist or worsen advised ED

## 2018-08-26 NOTE — Patient Instructions (Addendum)
Orders: Chest CT without contrast re: pulmonary nodules Labs today (sed rate and CBC with differential)  Recommendations: Follow-up with GI Continue Prilosec daily  Ibuprofen 600 to 800 mg every 6 hours as needed for pain - make sure to take with food  Follow-up: 1 to 2 months with Dr. Chase Caller

## 2018-08-26 NOTE — Progress Notes (Signed)
@Patient  ID: Richard Franco, male    DOB: [birth year], 53 y.o.   MRN: [id number]  Chief Complaint  Patient presents with  . Acute Visit    Per patient, chest hurts when taking a deep breath. x2-3 months, sharp pain.     Referring provider: Anne Shutter  HPI: 53 year old male, former smoker quit in 2019 (28 pack year hx). PMH significant for pulmonary infiltrates, hypertension, GERD, pericarditis, NSTEMI with normal normal cath. Chronically abnormal CT chest. Patient of Dr. Chase Caller, last seen on 01/13/18.   Hx Atypical chest pain, somewhat pleuritic in nature. Extensive cardiology evaluation did not reveal abnormalities. CTA in Oct 2013 revealed three <1cm pulmonary nodules and multiple areas of ground glass. More short of breath over the last several years. Repeat CT chest Oct 2013 showed persistent lung nodules and ground glass opacities in different territory distribution. Steroids re-started at 20mg  daily and tapered to 10mg . Bronchoscopy with BAL cell count with elevated macrophages (possible COP, HP vs chronic microaspiration, positive aluminum exposure). AFB negative. Cytology/pathology benign with no evidence of aspiration or ILD. Barium swallow wnl. Methacholine challenge negative. CT chest in 2014 showed no GGO, lung nodules stable. Steriod's stopped.   08/26/2018 Patient presents today with complaints of chest pain with deep breath x 2-3 months. Pain is centrally located and not constant (not currently experiencing). Associated dry cough which is worse at night. Occasional shortness of breath but reports no more than normal. Not experiencing GERD symptoms. Hx of multiple neck surgeries, has some chronic trouble with swallowing.  Takes acid reflux medication. Has seen GI in the past and had an apt that needs to be reschedule. Rare ibuprofen as needed for HA's. Denies fever, chills, abd pain, N/V, arm/jaw pain, sob.   Allergies  Allergen Reactions  . Midodrine Hcl Swelling   Tongue swelling  . Sulfa Antibiotics Rash    Immunization History  Administered Date(s) Administered  . Influenza Split 04/16/2012  . Influenza,inj,Quad PF,6+ Mos 03/01/2013, 05/08/2015, 05/30/2017  . Influenza,inj,quad, With Preservative 05/02/2016  . Influenza-Unspecified 04/22/2016  . Pneumococcal Polysaccharide-23 04/16/2012    Past Medical History:  Diagnosis Date  . Abnormal chest CT 03/2012   a. 2013: Scattered patchy ground glass opacities and pulmonary nodules. Transbronchial biopsy with no definite etiology of lung finding- possible organizing pneumonia. b. F/u CT 05/2014: stable multiple tiny pulm nodules, no further w/u recommended per notes.   . Anxiety   . Arthritis   . CAD (coronary artery disease)   . Chronic midline posterior neck pain   . Complication of anesthesia    " difficult to urinate after "  . Depression   . Diabetes ([place])   . Diabetic retinopathy ([place])   . Esophageal stricture   . Fibromyalgia   . Fracture    back  . GERD (gastroesophageal reflux disease)   . Hypercholesteremia   . Hypertension   . Migraine headache    "none lately; I've had alot in my life" (06/21/2015)  . NSTEMI (non-ST elevated myocardial infarction) ([place]) 07/2012   a. Normal cath 03/2012. b. 07/2012: troponin 4, normal cors, ?vasospasm. Did not tolerate Imdur due to headache. On amlodipine.  . Stroke (cerebrum) ([place])   . Testosterone deficiency   . Tobacco abuse     Tobacco History: Social History   Tobacco Use  Smoking Status Former Smoker  . Packs/day: 1.00  . Years: 28.00  . Pack years: 28.00  . Types: Cigarettes  . Last attempt to quit: 12/16/2017  .  Years since quitting: 0.6  Smokeless Tobacco Former Systems developer  . Types: Chew   Counseling given: Not Answered   Outpatient Medications Prior to Visit  Medication Sig Dispense Refill  . amLODipine (NORVASC) 5 MG tablet Take 5 mg by mouth 2 (two) times daily.    Marland Kitchen aspirin EC 81 MG EC tablet Take 1 tablet (81 mg total)  by mouth daily.    Marland Kitchen esomeprazole (NEXIUM) 40 MG capsule TAKE ONE CAPSULE BY MOUTH TWICE A DAY BEFORE A MEAL. 180 capsule 3  . FLUoxetine (PROZAC) 20 MG capsule Take 20 mg by mouth 2 (two) times daily.     Marland Kitchen HYDROcodone-acetaminophen (NORCO/VICODIN) 5-325 MG tablet Take 1-2 tablets by mouth every 6 (six) hours as needed for moderate pain or severe pain. 10 tablet 0  . ibuprofen (ADVIL,MOTRIN) 200 MG tablet Take 600-800 mg by mouth every 6 (six) hours as needed for moderate pain.    Marland Kitchen lamoTRIgine (LAMICTAL) 100 MG tablet Take 150 mg by mouth 2 (two) times daily.     . nitroGLYCERIN (NITROSTAT) 0.4 MG SL tablet DISSOLVE ONE TABLET UNDER THE TONGUE EVERY 5 MINUTES AS NEEDED FOR CHEST PAIN.  DO NOT EXCEED A TOTAL OF 3 DOSES IN 15 MINUTES 25 tablet 11  . promethazine (PHENERGAN) 25 MG tablet Take 25 mg by mouth every 6 (six) hours as needed for nausea or vomiting.    Marland Kitchen albuterol (PROVENTIL HFA;VENTOLIN HFA) 108 (90 Base) MCG/ACT inhaler Inhale 2 puffs into the lungs every 6 (six) hours as needed for wheezing or shortness of breath. (Patient not taking: Reported on 12/03/2017) 1 Inhaler 6  . Cal Carb-Mag Hydrox-Simeth (ROLAIDS ADVANCED PO) Take 2 tablets by mouth as needed (indigestion).    . carboxymethylcellulose (REFRESH PLUS) 0.5 % SOLN Place 1 drop into both eyes daily as needed (dry eyes).    Marland Kitchen omeprazole (PRILOSEC) 20 MG capsule Take 1 capsule (20 mg total) by mouth daily for 14 days. 14 capsule 0  . QUEtiapine (SEROQUEL) 25 MG tablet Take 25 mg by mouth at bedtime.  0  . sucralfate (CARAFATE) 1 g tablet Take 1 tablet (1 g total) by mouth 4 (four) times daily -  with meals and at bedtime for 5 days. 20 tablet 0   No facility-administered medications prior to visit.     Review of Systems  Review of Systems  Constitutional: Negative.   HENT: Negative.   Respiratory: Positive for cough and shortness of breath.        Pleuritic pain, intermittent   Cardiovascular: Negative for chest pain,  palpitations and leg swelling.  Gastrointestinal: Negative.     Physical Exam  BP 118/88 (BP Location: Right Arm, Patient Position: Sitting, Cuff Size: Normal)   Pulse 83   Ht 5\' 9"  (1.753 m)   Wt 183 lb 3.2 oz (83.1 kg)   SpO2 98%   BMI 27.05 kg/m  Physical Exam Constitutional:      Appearance: He is well-developed.  HENT:     Head: Normocephalic and atraumatic.  Eyes:     Pupils: Pupils are equal, round, and reactive to light.  Neck:     Musculoskeletal: Normal range of motion and neck supple.  Cardiovascular:     Rate and Rhythm: Normal rate and regular rhythm.     Heart sounds: Normal heart sounds.  Pulmonary:     Effort: Pulmonary effort is normal. No respiratory distress.     Breath sounds: Normal breath sounds. No wheezing.  Abdominal:  General: Bowel sounds are normal.     Palpations: Abdomen is soft.     Tenderness: There is no abdominal tenderness.  Skin:    General: Skin is warm and dry.     Findings: No erythema or rash.  Neurological:     Mental Status: He is alert and oriented to person, place, and time.  Psychiatric:        Behavior: Behavior normal.        Judgment: Judgment normal.      Lab Results:  CBC    Component Value Date/Time   WBC 5.8 01/16/2018 1149   RBC 4.83 01/16/2018 1149   HGB 14.7 01/16/2018 1149   HGB 15.0 09/18/2015 0845   HCT 44.1 01/16/2018 1149   HCT 44.3 09/18/2015 0845   PLT 215 01/16/2018 1149   PLT 235 09/18/2015 0845   MCV 91.3 01/16/2018 1149   MCV 93 09/18/2015 0845   MCH 30.4 01/16/2018 1149   MCHC 33.3 01/16/2018 1149   RDW 12.3 01/16/2018 1149   RDW 14.1 09/18/2015 0845   LYMPHSABS 1.1 12/03/2017 1121   LYMPHSABS 1.3 09/18/2015 0845   MONOABS 0.4 12/03/2017 1121   EOSABS 0.1 12/03/2017 1121   EOSABS 0.2 09/18/2015 0845   BASOSABS 0.0 12/03/2017 1121   BASOSABS 0.0 09/18/2015 0845    BMET    Component Value Date/Time   NA 140 01/16/2018 1149   NA 142 09/18/2015 0845   K 3.8 01/16/2018 1149    CL 104 01/16/2018 1149   CO2 25 01/16/2018 1149   GLUCOSE 110 (H) 01/16/2018 1149   BUN 12 01/16/2018 1149   BUN 11 09/18/2015 0845   CREATININE 1.28 (H) 01/16/2018 1149   CREATININE 1.14 03/24/2012 1646   CALCIUM 9.6 01/16/2018 1149   GFRNONAA >60 01/16/2018 1149   GFRAA >60 01/16/2018 1149    BNP    Component Value Date/Time   BNP 10.9 01/16/2018 1236    ProBNP    Component Value Date/Time   PROBNP 4.0 05/05/2012 1105    Imaging: No results found.   Assessment & Plan:   Pleuritic chest pain - Intermittent chest pain for 2-3 months, worse with deep breath/cough  - Non-cardiac in nature, likely pleuritic or esophageal  - Hx pulmonary nodules and GGO - Needs repeat CT chest w/o contrast and labs (Sed rate/CBC) - Recommend GI follow-up  - If symptoms persist or worsen advised ED    Martyn Ehrich, NP 08/26/2018

## 2018-09-05 DIAGNOSIS — R0789 Other chest pain: Secondary | ICD-10-CM | POA: Diagnosis not present

## 2018-09-08 ENCOUNTER — Ambulatory Visit (INDEPENDENT_AMBULATORY_CARE_PROVIDER_SITE_OTHER)
Admission: RE | Admit: 2018-09-08 | Discharge: 2018-09-08 | Disposition: A | Discharge status: Home / Self Care | Payer: Medicare Other | Source: Ambulatory Visit | Attending: Primary Care | Admitting: Primary Care

## 2018-09-08 ENCOUNTER — Other Ambulatory Visit: Payer: Self-pay

## 2018-09-08 DIAGNOSIS — R918 Other nonspecific abnormal finding of lung field: Secondary | ICD-10-CM | POA: Diagnosis not present

## 2018-09-08 DIAGNOSIS — R911 Solitary pulmonary nodule: Secondary | ICD-10-CM

## 2018-09-16 DIAGNOSIS — F411 Generalized anxiety disorder: Secondary | ICD-10-CM | POA: Diagnosis not present

## 2018-09-17 DIAGNOSIS — Z6826 Body mass index (BMI) 26.0-26.9, adult: Secondary | ICD-10-CM | POA: Diagnosis not present

## 2018-09-17 DIAGNOSIS — M961 Postlaminectomy syndrome, not elsewhere classified: Secondary | ICD-10-CM | POA: Diagnosis not present

## 2018-09-17 DIAGNOSIS — R51 Headache: Secondary | ICD-10-CM | POA: Diagnosis not present

## 2018-10-09 ENCOUNTER — Ambulatory Visit: Payer: Medicare Other | Admitting: Internal Medicine

## 2018-10-12 ENCOUNTER — Encounter: Payer: Self-pay | Admitting: Gastroenterology

## 2018-11-09 DIAGNOSIS — M47812 Spondylosis without myelopathy or radiculopathy, cervical region: Secondary | ICD-10-CM | POA: Diagnosis not present

## 2018-12-01 DIAGNOSIS — Z03818 Encounter for observation for suspected exposure to other biological agents ruled out: Secondary | ICD-10-CM | POA: Diagnosis not present

## 2018-12-03 DIAGNOSIS — M47812 Spondylosis without myelopathy or radiculopathy, cervical region: Secondary | ICD-10-CM | POA: Diagnosis not present

## 2018-12-15 DIAGNOSIS — M961 Postlaminectomy syndrome, not elsewhere classified: Secondary | ICD-10-CM | POA: Diagnosis not present

## 2018-12-28 ENCOUNTER — Other Ambulatory Visit: Payer: Self-pay | Admitting: Neurosurgery

## 2018-12-28 DIAGNOSIS — I10 Essential (primary) hypertension: Secondary | ICD-10-CM | POA: Diagnosis not present

## 2018-12-28 DIAGNOSIS — Z6827 Body mass index (BMI) 27.0-27.9, adult: Secondary | ICD-10-CM | POA: Diagnosis not present

## 2018-12-28 DIAGNOSIS — M961 Postlaminectomy syndrome, not elsewhere classified: Secondary | ICD-10-CM

## 2018-12-28 DIAGNOSIS — M47812 Spondylosis without myelopathy or radiculopathy, cervical region: Secondary | ICD-10-CM | POA: Diagnosis not present

## 2018-12-28 DIAGNOSIS — R51 Headache: Secondary | ICD-10-CM | POA: Diagnosis not present

## 2019-01-05 ENCOUNTER — Ambulatory Visit
Admission: RE | Admit: 2019-01-05 | Discharge: 2019-01-05 | Disposition: A | Discharge status: Home / Self Care | Payer: Medicare Other | Source: Ambulatory Visit | Attending: Neurosurgery | Admitting: Neurosurgery

## 2019-01-05 DIAGNOSIS — M4803 Spinal stenosis, cervicothoracic region: Secondary | ICD-10-CM | POA: Diagnosis not present

## 2019-01-05 DIAGNOSIS — M961 Postlaminectomy syndrome, not elsewhere classified: Secondary | ICD-10-CM

## 2019-01-05 DIAGNOSIS — R51 Headache: Secondary | ICD-10-CM | POA: Diagnosis not present

## 2019-01-05 DIAGNOSIS — M542 Cervicalgia: Secondary | ICD-10-CM | POA: Diagnosis not present

## 2019-01-05 MED ORDER — IOPAMIDOL (ISOVUE-M 300) INJECTION 61%
10.0000 mL | Freq: Once | INTRAMUSCULAR | Status: AC
  Administered 2019-01-05: 12:00:00 10 mL via INTRATHECAL

## 2019-01-05 MED ORDER — ONDANSETRON HCL 4 MG/2ML IJ SOLN
4.0000 mg | Freq: Once | INTRAMUSCULAR | Status: AC
  Administered 2019-01-05: 11:00:00 4 mg via INTRAMUSCULAR

## 2019-01-05 MED ORDER — MEPERIDINE HCL 50 MG/ML IJ SOLN
50.0000 mg | Freq: Once | INTRAMUSCULAR | Status: AC
  Administered 2019-01-05: 11:00:00 50 mg via INTRAMUSCULAR

## 2019-01-05 MED ORDER — DIAZEPAM 5 MG PO TABS
5.0000 mg | ORAL_TABLET | Freq: Once | ORAL | Status: AC
  Administered 2019-01-05: 11:00:00 5 mg via ORAL

## 2019-01-05 NOTE — Discharge Instructions (Signed)
Myelogram Discharge Instructions  1. Go home and rest quietly for the next 24 hours.  It is important to lie flat for the next 24 hours.  Get up only to go to the restroom.  You may lie in the bed or on a couch on your back, your stomach, your left side or your right side.  You may have one pillow under your head.  You may have pillows between your knees while you are on your side or under your knees while you are on your back.  2. DO NOT drive today.  Recline the seat as far back as it will go, while still wearing your seat belt, on the way home.  3. You may get up to go to the bathroom as needed.  You may sit up for 10 minutes to eat.  You may resume your normal diet and medications unless otherwise indicated.  Drink lots of extra fluids today and tomorrow.  4. The incidence of headache, nausea, or vomiting is about 5% (one in 20 patients).  If you develop a headache, lie flat and drink plenty of fluids until the headache goes away.  Caffeinated beverages may be helpful.  If you develop severe nausea and vomiting or a headache that does not go away with flat bed rest, call [phone].  5. You may resume normal activities after your 24 hours of bed rest is over; however, do not exert yourself strongly or do any heavy lifting tomorrow. If when you get up you have a headache when standing, go back to bed and force fluids for another 24 hours.  6. Call your physician for a follow-up appointment.  The results of your myelogram will be sent directly to your physician by the following day.  7. If you have any questions or if complications develop after you arrive home, please call [phone].  Discharge instructions have been explained to the patient.  The patient, or the person responsible for the patient, fully understands these instructions.  YOU MAY RESTART YOUR PROZAC AND PHENERGAN TOMORROW 01/06/2019 AT 11:00AM.

## 2019-01-13 DIAGNOSIS — F411 Generalized anxiety disorder: Secondary | ICD-10-CM | POA: Diagnosis not present

## 2019-01-13 DIAGNOSIS — F17200 Nicotine dependence, unspecified, uncomplicated: Secondary | ICD-10-CM | POA: Diagnosis not present

## 2019-01-13 DIAGNOSIS — F330 Major depressive disorder, recurrent, mild: Secondary | ICD-10-CM | POA: Diagnosis not present

## 2019-01-15 ENCOUNTER — Telehealth: Payer: Self-pay | Admitting: Cardiology

## 2019-01-15 NOTE — Telephone Encounter (Signed)

## 2019-01-15 NOTE — Telephone Encounter (Signed)
Called pt to change appointment to virtual or reschedule at a later date for in office. Left message to call back.

## 2019-01-18 ENCOUNTER — Telehealth (INDEPENDENT_AMBULATORY_CARE_PROVIDER_SITE_OTHER): Payer: Medicare Other | Admitting: Cardiology

## 2019-01-18 ENCOUNTER — Other Ambulatory Visit: Payer: Self-pay

## 2019-01-18 VITALS — Wt 184.0 lb

## 2019-01-18 DIAGNOSIS — R5382 Chronic fatigue, unspecified: Secondary | ICD-10-CM | POA: Diagnosis not present

## 2019-01-18 DIAGNOSIS — R0789 Other chest pain: Secondary | ICD-10-CM | POA: Diagnosis not present

## 2019-01-18 DIAGNOSIS — Z7189 Other specified counseling: Secondary | ICD-10-CM

## 2019-01-18 DIAGNOSIS — E785 Hyperlipidemia, unspecified: Secondary | ICD-10-CM

## 2019-01-18 DIAGNOSIS — I10 Essential (primary) hypertension: Secondary | ICD-10-CM | POA: Diagnosis not present

## 2019-01-18 NOTE — Patient Instructions (Signed)

## 2019-01-18 NOTE — Progress Notes (Signed)
Virtual Visit via Telephone Note   This visit type was conducted due to national recommendations for restrictions regarding the COVID-19 Pandemic (e.g. social distancing) in an effort to limit this patient's exposure and mitigate transmission in our community.  Due to his co-morbid illnesses, this patient is at least at moderate risk for complications without adequate follow up.  This format is felt to be most appropriate for this patient at this time.  The patient did not have access to video technology/had technical difficulties with video requiring transitioning to audio format only (telephone).  All issues noted in this document were discussed and addressed.  No physical exam could be performed with this format.  Please refer to the patient's chart for his  consent to telehealth for [facility].   Date:  01/18/2019   ID:  Richard Franco, DOB 1966/02/03, MRN [id number]  Patient Location: Home Provider Location: Home  PCP:  Rolene Course, PA-C  Cardiologist:  Buford Dresser, MD (prior Jory Sims, Truitt Merle and Dr. Aundra Dubin) Electrophysiologist:  None   Evaluation Performed:  Follow-Up Visit  Chief Complaint:  Follow up  History of Present Illness:    Richard Franco is a 53 y.o. male with PMH chest pain syndrome (no CAD, question vasospasm vs. Other etiology), hypertension, hypercholesterolemia, fibromyalgia, pulmonary ground glass opacities who was last seen by Jory Sims in 09/2017 (previously seen by Truitt Merle and Dr. Aundra Dubin).  The patient does not have symptoms concerning for COVID-19 infection (fever, chills, cough, or new shortness of breath).   Patient concerns today: -chronic severe fatigue. Has been going on for a long time. Reviewed results of his echo, CPX -still has occasional chest pain. Largely pleuritic, sharp. Improved when he quit smoking about 2 years ago.   Doesn't have a provider for his fibromyalgia. Has just been pushing through  it. Tried lyrica, felt terrible on it.   Quit smoking, rarely drinks alcohol.   BP: has been running well recently.  Hasn't seen very high numbers in a long time. Recently 115/70. Checks about once a week.   Denies shortness of breath at rest or with normal exertion. No PND, orthopnea, LE edema or unexpected weight gain. No syncope or palpitations.   Past Medical History:  Diagnosis Date  . Abnormal chest CT 03/2012   a. 2013: Scattered patchy ground glass opacities and pulmonary nodules. Transbronchial biopsy with no definite etiology of lung finding- possible organizing pneumonia. b. F/u CT 05/2014: stable multiple tiny pulm nodules, no further w/u recommended per notes.   . Anxiety   . Arthritis   . CAD (coronary artery disease)   . Chronic midline posterior neck pain   . Complication of anesthesia    " difficult to urinate after "  . Depression   . Diabetes ([place])   . Diabetic retinopathy ([place])   . Esophageal stricture   . Fibromyalgia   . Fracture    back  . GERD (gastroesophageal reflux disease)   . Hypercholesteremia   . Hypertension   . Migraine headache    "none lately; I've had alot in my life" (06/21/2015)  . NSTEMI (non-ST elevated myocardial infarction) ([place]) 07/2012   a. Normal cath 03/2012. b. 07/2012: troponin 4, normal cors, ?vasospasm. Did not tolerate Imdur due to headache. On amlodipine.  . Stroke (cerebrum) ([place])   . Testosterone deficiency   . Tobacco abuse    Past Surgical History:  Procedure Laterality Date  . ANKLE FUSION Left 2006  . BACK SURGERY    .  BREAST LUMPECTOMY    . CERVICAL FUSION  X7 "last one 04/2011"  . COLONOSCOPY    . CYSTECTOMY    . FRACTURE SURGERY    . LEFT HEART CATHETERIZATION WITH CORONARY ANGIOGRAM N/A 03/25/2012   Procedure: LEFT HEART CATHETERIZATION WITH CORONARY ANGIOGRAM;  Surgeon: Burnell Blanks, MD;  Location: [facility] CATH LAB;  Service: Cardiovascular;  Laterality: N/A;  . LEFT HEART CATHETERIZATION WITH CORONARY  ANGIOGRAM N/A 08/11/2012   Procedure: LEFT HEART CATHETERIZATION WITH CORONARY ANGIOGRAM;  Surgeon: Peter M Martinique, MD;  Location: [facility] CATH LAB;  Service: Cardiovascular;  Laterality: N/A;  . POSTERIOR CERVICAL FUSION/FORAMINOTOMY  05/09/2011   Procedure: POSTERIOR CERVICAL FUSION/FORAMINOTOMY LEVEL 1;  Surgeon: Hosie Spangle;  Location: [facility] NEURO ORS;  Service: Neurosurgery;  Laterality: N/A;  C4/5 posterior arthrodesis with instrumentation   . UPPER GASTROINTESTINAL ENDOSCOPY    . VIDEO BRONCHOSCOPY  05/06/2012   Procedure: VIDEO BRONCHOSCOPY WITH FLUORO;  Surgeon: Jyl Heinz, MD;  Location: [facility] ENDOSCOPY;  Service: Cardiopulmonary;  Laterality: Bilateral;     No outpatient medications have been marked as taking for the 01/18/19 encounter (Telemedicine) with Buford Dresser, MD.     Allergies:   Midodrine hcl and Sulfa antibiotics   Social History   Tobacco Use  . Smoking status: Former Smoker    Packs/day: 1.00    Years: 28.00    Pack years: 28.00    Types: Cigarettes    Quit date: 12/16/2017    Years since quitting: 1.0  . Smokeless tobacco: Former Systems developer    Types: Chew  Substance Use Topics  . Alcohol use: No    Alcohol/week: 21.0 standard drinks    Types: 21 Cans of beer per week    Comment: 06/21/2015 "3 beers/day lately; more lately cause of being separated"   . Drug use: No     Family Hx: The patient's family history includes Asthma in his sister; Cancer in his mother; Coronary artery disease in his paternal uncle; Diabetes in his brother, father, and mother; Hyperlipidemia in his father; Hypertension in his brother and mother. There is no history of Colon cancer, Colon polyps, Rectal cancer, or Stomach cancer.  ROS:   Please see the history of present illness.    Constitutional: Negative for chills, fever, unintentional weight loss. Sweats at night chronically when he takes pain medication.  HENT: Negative for ear pain and hearing loss.   Eyes: Negative for  loss of vision and eye pain.  Respiratory: Negative for cough, sputum, wheezing.   Cardiovascular: See HPI. Gastrointestinal: Negative for abdominal pain, melena, and hematochezia.  Genitourinary: Negative for dysuria and hematuria.  Musculoskeletal: Negative for falls. Positive for chronic myalgias.  Skin: Negative for itching and rash.  Neurological: Negative for focal weakness, focal sensory changes and loss of consciousness.  Endo/Heme/Allergies: Does not bruise/bleed easily.  All other systems reviewed and are negative.   Prior CV studies:   The following studies were reviewed today: Prior echoes, CPX  Labs/Other Tests and Data Reviewed:    EKG:  An ECG dated 01/16/18 was personally reviewed today and demonstrated:  sinus tachycardia  Recent Labs: 08/26/2018: Hemoglobin 14.5; Platelets 194.0   Recent Lipid Panel Lab Results  Component Value Date/Time   CHOL 203 (H) 07/05/2016 02:22 AM   TRIG 134 07/05/2016 02:22 AM   HDL 54 07/05/2016 02:22 AM   CHOLHDL 3.8 07/05/2016 02:22 AM   LDLCALC 122 (H) 07/05/2016 02:22 AM    Wt Readings from Last 3 Encounters:  01/18/19  184 lb (83.5 kg)  08/26/18 183 lb 3.2 oz (83.1 kg)  01/16/18 189 lb (85.7 kg)     Objective:    Vital Signs:  Wt 184 lb (83.5 kg)   BMI 27.17 kg/m    Speaking comfortably on the phone, in no acute distress  ASSESSMENT & PLAN:    Chronic chest pain syndrome, chronic fatigue: stable, atypical. He has not seen a specialist for his fibromyalgia, and I wonder if that is contributing to his symptoms. -he will talk to his PCP about recommendations for fibromyalgia specialists -counseled on red flag warning signs that need immediate medical attention -has PRN SL NG -takes daily aspirin for prevention  Hypertension: reports good control on home cuff -continue amlodipine  Hypercholesterolemia: father can't take statins, and with his chronic myalgia he does not wish to trial -last LDL 122 -on aspirin for  primary prevention instead -encouraged diet and exercise changes  COVID-19 Education: The signs and symptoms of COVID-19 were discussed with the patient and how to seek care for testing (follow up with PCP or arrange E-visit).  The importance of social distancing was discussed today.  Time:   Today, I have spent 22 minutes with the patient with telehealth technology discussing the above problems.     Medication Adjustments/Labs and Tests Ordered: Current medicines are reviewed at length with the patient today.  Concerns regarding medicines are outlined above.  Patient Instructions  Medication Instructions:  Your Physician recommend you continue on your current medication as directed.    If you need a refill on your cardiac medications before your next appointment, please call your pharmacy.   Lab work: None  Testing/Procedures: None  Follow-Up: At [facility], you and your health needs are our priority.  As part of our continuing mission to provide you with exceptional heart care, we have created designated Provider Care Teams.  These Care Teams include your primary Cardiologist (physician) and Advanced Practice Providers (APPs -  Physician Assistants and Nurse Practitioners) who all work together to provide you with the care you need, when you need it. You will need a follow up appointment in 1 years.  Please call our office 2 months in advance to schedule this appointment.  You may see Dr. Harrell Gave or one of the following Advanced Practice Providers on your designated Care Team:   Rosaria Ferries, PA-C . Jory Sims, DNP, ANP      Signed, Buford Dresser, MD  01/18/2019  [facility]

## 2019-01-22 ENCOUNTER — Encounter: Payer: Self-pay | Admitting: Cardiology

## 2019-03-08 ENCOUNTER — Other Ambulatory Visit: Payer: Self-pay | Admitting: Gastroenterology

## 2019-03-11 DIAGNOSIS — M47812 Spondylosis without myelopathy or radiculopathy, cervical region: Secondary | ICD-10-CM | POA: Diagnosis not present

## 2019-03-11 DIAGNOSIS — Z6827 Body mass index (BMI) 27.0-27.9, adult: Secondary | ICD-10-CM | POA: Diagnosis not present

## 2019-03-11 DIAGNOSIS — R51 Headache: Secondary | ICD-10-CM | POA: Diagnosis not present

## 2019-03-11 DIAGNOSIS — M961 Postlaminectomy syndrome, not elsewhere classified: Secondary | ICD-10-CM | POA: Diagnosis not present

## 2019-03-11 DIAGNOSIS — I10 Essential (primary) hypertension: Secondary | ICD-10-CM | POA: Diagnosis not present

## 2019-03-27 DIAGNOSIS — J029 Acute pharyngitis, unspecified: Secondary | ICD-10-CM | POA: Diagnosis not present

## 2019-03-27 DIAGNOSIS — J309 Allergic rhinitis, unspecified: Secondary | ICD-10-CM | POA: Diagnosis not present

## 2019-04-12 ENCOUNTER — Other Ambulatory Visit: Payer: Self-pay | Admitting: Gastroenterology

## 2019-04-15 ENCOUNTER — Telehealth: Payer: Self-pay | Admitting: Gastroenterology

## 2019-04-15 MED ORDER — ESOMEPRAZOLE MAGNESIUM 40 MG PO CPDR: DELAYED_RELEASE_CAPSULE | ORAL | 3 refills | Status: DC

## 2019-04-15 NOTE — Telephone Encounter (Signed)
Called patient and informed him that Nexium refills was sent into [facility] pharmacy

## 2019-04-15 NOTE — Telephone Encounter (Signed)
Patient is calling to get the esomeprazole refilled.

## 2019-06-03 DIAGNOSIS — I10 Essential (primary) hypertension: Secondary | ICD-10-CM | POA: Diagnosis not present

## 2019-06-03 DIAGNOSIS — Z6827 Body mass index (BMI) 27.0-27.9, adult: Secondary | ICD-10-CM | POA: Diagnosis not present

## 2019-06-03 DIAGNOSIS — M47812 Spondylosis without myelopathy or radiculopathy, cervical region: Secondary | ICD-10-CM | POA: Diagnosis not present

## 2019-06-17 DIAGNOSIS — R05 Cough: Secondary | ICD-10-CM | POA: Diagnosis not present

## 2019-06-23 ENCOUNTER — Emergency Department (HOSPITAL_COMMUNITY): Payer: Medicare Other

## 2019-06-23 ENCOUNTER — Other Ambulatory Visit: Payer: Self-pay

## 2019-06-23 ENCOUNTER — Emergency Department (HOSPITAL_COMMUNITY)
Admission: EM | Admit: 2019-06-23 | Discharge: 2019-06-24 | Disposition: A | Discharge status: Home / Self Care | Payer: Medicare Other | Attending: Emergency Medicine | Admitting: Emergency Medicine

## 2019-06-23 DIAGNOSIS — I252 Old myocardial infarction: Secondary | ICD-10-CM | POA: Insufficient documentation

## 2019-06-23 DIAGNOSIS — R072 Precordial pain: Secondary | ICD-10-CM | POA: Insufficient documentation

## 2019-06-23 DIAGNOSIS — I10 Essential (primary) hypertension: Secondary | ICD-10-CM | POA: Insufficient documentation

## 2019-06-23 DIAGNOSIS — Z79899 Other long term (current) drug therapy: Secondary | ICD-10-CM | POA: Insufficient documentation

## 2019-06-23 DIAGNOSIS — R079 Chest pain, unspecified: Secondary | ICD-10-CM | POA: Diagnosis not present

## 2019-06-23 DIAGNOSIS — E119 Type 2 diabetes mellitus without complications: Secondary | ICD-10-CM | POA: Diagnosis not present

## 2019-06-23 DIAGNOSIS — Z87891 Personal history of nicotine dependence: Secondary | ICD-10-CM | POA: Diagnosis not present

## 2019-06-23 DIAGNOSIS — I2510 Atherosclerotic heart disease of native coronary artery without angina pectoris: Secondary | ICD-10-CM | POA: Insufficient documentation

## 2019-06-23 DIAGNOSIS — R0789 Other chest pain: Secondary | ICD-10-CM

## 2019-06-23 LAB — CBC
HCT: 40 % (ref 39.0–52.0)
Hemoglobin: 13.4 g/dL (ref 13.0–17.0)
MCH: 30.1 pg (ref 26.0–34.0)
MCHC: 33.5 g/dL (ref 30.0–36.0)
MCV: 89.9 fL (ref 80.0–100.0)
Platelets: 188 10*3/uL (ref 150–400)
RBC: 4.45 MIL/uL (ref 4.22–5.81)
RDW: 12.8 % (ref 11.5–15.5)
WBC: 6.7 10*3/uL (ref 4.0–10.5)
nRBC: 0 % (ref 0.0–0.2)

## 2019-06-23 LAB — BASIC METABOLIC PANEL
Anion gap: 11 (ref 5–15)
BUN: 13 mg/dL (ref 6–20)
CO2: 25 mmol/L (ref 22–32)
Calcium: 9.4 mg/dL (ref 8.9–10.3)
Chloride: 102 mmol/L (ref 98–111)
Creatinine, Ser: 1.22 mg/dL (ref 0.61–1.24)
GFR calc Af Amer: >60 mL/min (ref >60)
GFR calc non Af Amer: >60 mL/min (ref >60)
Glucose, Bld: 109 mg/dL — ABNORMAL HIGH (ref 70–99)
Potassium: 4 mmol/L (ref 3.5–5.1)
Sodium: 138 mmol/L (ref 135–145)

## 2019-06-23 LAB — TROPONIN I (HIGH SENSITIVITY)
Troponin I (High Sensitivity): 3 ng/L (ref <18)
Troponin I (High Sensitivity): 3 ng/L (ref <18)

## 2019-06-23 MED ORDER — SODIUM CHLORIDE 0.9% FLUSH: 3.0000 mL | Freq: Once | INTRAVENOUS | Status: DC

## 2019-06-23 NOTE — ED Triage Notes (Signed)
Onset today chest pain.  Pt took NTG x 4, had some relief with the first few NTG but no relief with last NTG.  Pain is constant.  NO other s/s noted.

## 2019-06-24 MED ORDER — LIDOCAINE VISCOUS HCL 2 % MT SOLN: 15.0000 mL | OROMUCOSAL | 0 refills | Status: DC | PRN

## 2019-06-24 MED ORDER — LIDOCAINE VISCOUS HCL 2 % MT SOLN
15.0000 mL | Freq: Once | OROMUCOSAL | Status: AC
  Administered 2019-06-24: 15 mL via ORAL
  Filled 2019-06-24: qty 15

## 2019-06-24 MED ORDER — ALUM & MAG HYDROXIDE-SIMETH 200-200-20 MG/5ML PO SUSP
30.0000 mL | Freq: Once | ORAL | Status: AC
  Administered 2019-06-24: 30 mL via ORAL
  Filled 2019-06-24: qty 30

## 2019-06-24 NOTE — ED Notes (Signed)
Patient verbalizes understanding of discharge instructions. Opportunity for questioning and answers were provided. Armband removed by staff, pt discharged from ED.  

## 2019-06-24 NOTE — ED Provider Notes (Signed)
[facility] EMERGENCY DEPARTMENT Provider Note  CSN: [id number] Arrival date & time: 06/23/19 1723  Chief Complaint(s) Chest Pain  HPI Richard Franco is a 53 y.o. male   The history is provided by the patient.  Chest Pain Pain location:  Substernal area Pain quality: sharp   Pain radiates to:  Does not radiate Pain severity:  Moderate Duration:  24 hours Timing:  Constant Progression:  Unchanged Chronicity:  Recurrent Relieved by: mild relief with NTG. Exacerbated by: lying back. Associated symptoms: no anxiety, no cough, no nausea, no shortness of breath and no vomiting     Past Medical History Past Medical History:  Diagnosis Date  . Abnormal chest CT 03/2012   a. 2013: Scattered patchy ground glass opacities and pulmonary nodules. Transbronchial biopsy with no definite etiology of lung finding- possible organizing pneumonia. b. F/u CT 05/2014: stable multiple tiny pulm nodules, no further w/u recommended per notes.   . Anxiety   . Arthritis   . CAD (coronary artery disease)   . Chronic midline posterior neck pain   . Complication of anesthesia    " difficult to urinate after "  . Depression   . Diabetes ([place])   . Diabetic retinopathy ([place])   . Esophageal stricture   . Fibromyalgia   . Fracture    back  . GERD (gastroesophageal reflux disease)   . Hypercholesteremia   . Hypertension   . Migraine headache    "none lately; I've had alot in my life" (06/21/2015)  . NSTEMI (non-ST elevated myocardial infarction) ([place]) 07/2012   a. Normal cath 03/2012. b. 07/2012: troponin 4, normal cors, ?vasospasm. Did not tolerate Imdur due to headache. On amlodipine.  . Stroke (cerebrum) ([place])   . Testosterone deficiency   . Tobacco abuse    Patient Active Problem List   Diagnosis Date Noted  . Throat swelling 07/06/2016  . Cocaine use 07/06/2016  . ETOH abuse 07/06/2016  . Dyspnea on exertion 07/04/2016  . Chest pain 07/04/2016  . Essential  hypertension 07/04/2016  . AKI (acute kidney injury) ([place]) 07/04/2016  . Chest pain, atypical 11/16/2015  . Pleuritic chest pain 06/22/2015  . Tobacco abuse 06/22/2015  . History of coronary vasospasm 06/22/2015  . Smoking history 07/19/2014  . Chronic fatigue 07/19/2014  . Chronic arthralgias of knees and hips 11/26/2012  . Hyperlipidemia 09/25/2012  . GERD (gastroesophageal reflux disease) 08/11/2012  . BPH (benign prostatic hyperplasia) 08/11/2012  . Abnormal immunological finding in serum 05/03/2012  . Pulmonary infiltrates 04/16/2012  . Pulmonary nodule 03/25/2012  . Abnormal CT lung screening 03/25/2012  . History of tobacco abuse 02/18/2012  . Fibromyalgia 02/18/2012  . Hx of migraine headaches 02/18/2012  . DYSPNEA 08/24/2009  . CHEST PAIN-UNSPECIFIED 08/24/2009  . ABFND, FALSE POSITIVE SEROLOGIC TEST, [facility] 01/12/2007  . OSTEOARTHRITIS 01/05/2007   Home Medication(s) Prior to Admission medications   Medication Sig Start Date End Date Taking? Authorizing Provider  amLODipine (NORVASC) 5 MG tablet Take 5 mg by mouth daily.     [provider]  aspirin EC 81 MG EC tablet Take 1 tablet (81 mg total) by mouth daily. 08/12/12   Dunn, Nedra Hai, PA-C  esomeprazole (NEXIUM) 40 MG capsule TAKE ONE CAPSULE BY MOUTH TWICE A DAY BEFORE A MEAL. 04/15/19   Mauri Pole, MD  FLUoxetine (PROZAC) 20 MG capsule Take 20 mg by mouth 2 (two) times daily.     [provider]  HYDROcodone-acetaminophen (NORCO/VICODIN) 5-325 MG tablet Take 1-2  tablets by mouth every 6 (six) hours as needed for moderate pain or severe pain. 01/16/18   Duffy Bruce, MD  ibuprofen (ADVIL,MOTRIN) 200 MG tablet Take 600-800 mg by mouth every 6 (six) hours as needed for moderate pain.    [provider]  lamoTRIgine (LAMICTAL) 100 MG tablet Take 150 mg by mouth 2 (two) times daily.     [provider]  lidocaine (XYLOCAINE) 2 % solution Use as directed 15 mLs in the mouth or  throat as needed for mouth pain. 06/24/19   Oluwaseun Bruyere, Grayce Sessions, MD  nitroGLYCERIN (NITROSTAT) 0.4 MG SL tablet DISSOLVE ONE TABLET UNDER THE TONGUE EVERY 5 MINUTES AS NEEDED FOR CHEST PAIN.  DO NOT EXCEED A TOTAL OF 3 DOSES IN 15 MINUTES 07/30/18   Lendon Colonel, NP  promethazine (PHENERGAN) 25 MG tablet Take 25 mg by mouth every 6 (six) hours as needed for nausea or vomiting.    [provider]                                                                                                                                    Past Surgical History Past Surgical History:  Procedure Laterality Date  . ANKLE FUSION Left 2006  . BACK SURGERY    . BREAST LUMPECTOMY    . CERVICAL FUSION  X7 "last one 04/2011"  . COLONOSCOPY    . CYSTECTOMY    . FRACTURE SURGERY    . LEFT HEART CATHETERIZATION WITH CORONARY ANGIOGRAM N/A 03/25/2012   Procedure: LEFT HEART CATHETERIZATION WITH CORONARY ANGIOGRAM;  Surgeon: Burnell Blanks, MD;  Location: [facility] CATH LAB;  Service: Cardiovascular;  Laterality: N/A;  . LEFT HEART CATHETERIZATION WITH CORONARY ANGIOGRAM N/A 08/11/2012   Procedure: LEFT HEART CATHETERIZATION WITH CORONARY ANGIOGRAM;  Surgeon: Peter M Martinique, MD;  Location: [facility] CATH LAB;  Service: Cardiovascular;  Laterality: N/A;  . POSTERIOR CERVICAL FUSION/FORAMINOTOMY  05/09/2011   Procedure: POSTERIOR CERVICAL FUSION/FORAMINOTOMY LEVEL 1;  Surgeon: Hosie Spangle;  Location: [facility];  Service: Neurosurgery;  Laterality: N/A;  C4/5 posterior arthrodesis with instrumentation   . UPPER GASTROINTESTINAL ENDOSCOPY    . VIDEO BRONCHOSCOPY  05/06/2012   Procedure: VIDEO BRONCHOSCOPY WITH FLUORO;  Surgeon: Jyl Heinz, MD;  Location: [facility] ENDOSCOPY;  Service: Cardiopulmonary;  Laterality: Bilateral;   Family History Family History  Problem Relation Age of Onset  . Diabetes Mother   . Cancer Mother        ?  Marland Kitchen Hypertension Mother   . Hyperlipidemia Father   . Diabetes Father     . Coronary artery disease Paternal Uncle   . Asthma Sister   . Diabetes Brother   . Hypertension Brother   . Colon cancer Neg Hx   . Colon polyps Neg Hx   . Rectal cancer Neg Hx   . Stomach cancer Neg Hx     Social History Social History  Tobacco Use  . Smoking status: Former Smoker    Packs/day: 1.00    Years: 28.00    Pack years: 28.00    Types: Cigarettes    Quit date: 12/16/2017    Years since quitting: 1.5  . Smokeless tobacco: Former Systems developer    Types: Chew  Substance Use Topics  . Alcohol use: No    Alcohol/week: 21.0 standard drinks    Types: 21 Cans of beer per week    Comment: 06/21/2015 "3 beers/day lately; more lately cause of being separated"   . Drug use: No   Allergies Midodrine hcl and Sulfa antibiotics  Review of Systems Review of Systems  Respiratory: Negative for cough and shortness of breath.   Cardiovascular: Positive for chest pain.  Gastrointestinal: Negative for nausea and vomiting.   All other systems are reviewed and are negative for acute change except as noted in the HPI  Physical Exam Vital Signs  I have reviewed the triage vital signs BP 123/78 (BP Location: Right Arm)   Pulse 63   Temp 97.7 F (36.5 C) (Oral)   Resp 14   SpO2 99%   Physical Exam Vitals reviewed.  Constitutional:      General: He is not in acute distress.    Appearance: He is well-developed. He is not diaphoretic.  HENT:     Head: Normocephalic and atraumatic.     Nose: Nose normal.  Eyes:     General: No scleral icterus.       Right eye: No discharge.        Left eye: No discharge.     Conjunctiva/sclera: Conjunctivae normal.     Pupils: Pupils are equal, round, and reactive to light.  Cardiovascular:     Rate and Rhythm: Normal rate and regular rhythm.     Heart sounds: No murmur. No friction rub. No gallop.   Pulmonary:     Effort: Pulmonary effort is normal. No respiratory distress.     Breath sounds: Normal breath sounds. No stridor. No rales.   Abdominal:     General: There is no distension.     Palpations: Abdomen is soft.     Tenderness: There is no abdominal tenderness.  Musculoskeletal:        General: No tenderness.     Cervical back: Normal range of motion and neck supple.  Skin:    General: Skin is warm and dry.     Findings: No erythema or rash.  Neurological:     Mental Status: He is alert and oriented to person, place, and time.     ED Results and Treatments Labs (all labs ordered are listed, but only abnormal results are displayed) Labs Reviewed  BASIC METABOLIC PANEL - Abnormal; Notable for the following components:      Result Value   Glucose, Bld 109 (*)    All other components within normal limits  CBC  TROPONIN I (HIGH SENSITIVITY)  TROPONIN I (HIGH SENSITIVITY)  EKG  EKG Interpretation  Date/Time:  Wednesday June 23 2019 17:29:53 EST Ventricular Rate:  74 PR Interval:  152 QRS Duration: 84 QT Interval:  406 QTC Calculation: 450 R Axis:   61 Text Interpretation: Normal sinus rhythm Normal ECG No significant change since last tracing Confirmed by Addison Lank [phone]) on 06/23/2019 11:28:19 PM      Radiology DG Chest 2 View  Result Date: 06/23/2019 CLINICAL DATA:  Mid chest pain. EXAM: CHEST - 2 VIEW COMPARISON:  January 16, 2018. FINDINGS: The heart size and mediastinal contours are within normal limits. Both lungs are clear. The visualized skeletal structures are unremarkable. IMPRESSION: No active cardiopulmonary disease. Electronically Signed   By: Constance Holster M.D.   On: 06/23/2019 18:17    Pertinent labs & imaging results that were available during my care of the patient were reviewed by me and considered in my medical decision making (see chart for details).  Medications Ordered in ED Medications  sodium chloride flush (NS) 0.9 % injection 3 mL (0 mLs  Intravenous Hold 06/24/19 0307)  alum & mag hydroxide-simeth (MAALOX/MYLANTA) 200-200-20 MG/5ML suspension 30 mL (30 mLs Oral Given 06/24/19 0306)    And  lidocaine (XYLOCAINE) 2 % viscous mouth solution 15 mL (15 mLs Oral Given 06/24/19 [id number])                                                                                                                                    Procedures Procedures  (including critical care time)  Medical Decision Making / ED Course I have reviewed the nursing notes for this encounter and the patient's prior records (if available in EHR or on provided paperwork).   Richard Franco was evaluated in Emergency Department on 06/24/2019 for the symptoms described in the history of present illness. He was evaluated in the context of the global COVID-19 pandemic, which necessitated consideration that the patient might be at risk for infection with the SARS-CoV-2 virus that causes COVID-19. Institutional protocols and algorithms that pertain to the evaluation of patients at risk for COVID-19 are in a state of rapid change based on information released by regulatory bodies including the CDC and federal and state organizations. These policies and algorithms were followed during the patient's care in the ED.  Patient presents with 24 hours of substernal chest pain.  Nonexertional and nonradiating.  EKG without acute ischemic changes or evidence of pericarditis.  Serial troponins negative x2.  Given duration of pain and atypical nature, I have low suspicion for ACS.  Presentation is not classic for aortic dissection or esophageal perforation.  Low suspicion for pulmonary embolism.  Chest x-ray without evidence suggestive of pneumonia, pneumothorax, pneumomediastinum.  No abnormal contour of the mediastinum to suggest dissection. No evidence of acute injuries.  Patient reports chronic NSAID use and poor diet.  Likely GI related.  Patient has some relief with GI cocktail.   Recommended diet change  and refraining from NSAID use.  The patient appears reasonably screened and/or stabilized for discharge and I doubt any other medical condition or other [facility] requiring further screening, evaluation, or treatment in the ED at this time prior to discharge.  The patient is safe for discharge with strict return precautions.       Final Clinical Impression(s) / ED Diagnoses Final diagnoses:  Atypical chest pain    The patient appears reasonably screened and/or stabilized for discharge and I doubt any other medical condition or other [facility] requiring further screening, evaluation, or treatment in the ED at this time prior to discharge.  Disposition: Discharge  Condition: Good  I have discussed the results, Dx and Tx plan with the patient who expressed understanding and agree(s) with the plan. Discharge instructions discussed at great length. The patient was given strict return precautions who verbalized understanding of the instructions. No further questions at time of discharge.    ED Discharge Orders         Ordered    lidocaine (XYLOCAINE) 2 % solution  As needed     06/24/19 0408            Follow Up: Anne Shutter [place] [id number] [phone]  Schedule an appointment as soon as possible for a visit  As needed      This chart was dictated using voice recognition software.  Despite best efforts to proofread,  errors can occur which can change the documentation meaning.   Fatima Blank, MD 06/24/19 [phone]

## 2019-07-06 ENCOUNTER — Ambulatory Visit: Payer: Medicare Other | Admitting: Physician Assistant

## 2019-07-09 DIAGNOSIS — Z20828 Contact with and (suspected) exposure to other viral communicable diseases: Secondary | ICD-10-CM | POA: Diagnosis not present

## 2019-07-09 DIAGNOSIS — Z03818 Encounter for observation for suspected exposure to other biological agents ruled out: Secondary | ICD-10-CM | POA: Diagnosis not present

## 2019-07-12 DIAGNOSIS — Z20828 Contact with and (suspected) exposure to other viral communicable diseases: Secondary | ICD-10-CM | POA: Diagnosis not present

## 2019-07-12 DIAGNOSIS — Z03818 Encounter for observation for suspected exposure to other biological agents ruled out: Secondary | ICD-10-CM | POA: Diagnosis not present

## 2019-07-14 DIAGNOSIS — T7840XA Allergy, unspecified, initial encounter: Secondary | ICD-10-CM | POA: Diagnosis not present

## 2019-07-30 DIAGNOSIS — L03115 Cellulitis of right lower limb: Secondary | ICD-10-CM | POA: Diagnosis not present

## 2019-07-30 DIAGNOSIS — S7002XA Contusion of left hip, initial encounter: Secondary | ICD-10-CM | POA: Diagnosis not present

## 2019-08-06 DIAGNOSIS — L03115 Cellulitis of right lower limb: Secondary | ICD-10-CM | POA: Diagnosis not present

## 2019-08-10 DIAGNOSIS — L03115 Cellulitis of right lower limb: Secondary | ICD-10-CM | POA: Diagnosis not present

## 2019-08-31 DIAGNOSIS — Z79899 Other long term (current) drug therapy: Secondary | ICD-10-CM | POA: Diagnosis not present

## 2019-08-31 DIAGNOSIS — M961 Postlaminectomy syndrome, not elsewhere classified: Secondary | ICD-10-CM | POA: Diagnosis not present

## 2019-08-31 DIAGNOSIS — Z79891 Long term (current) use of opiate analgesic: Secondary | ICD-10-CM | POA: Diagnosis not present

## 2019-08-31 DIAGNOSIS — Z5181 Encounter for therapeutic drug level monitoring: Secondary | ICD-10-CM | POA: Diagnosis not present

## 2019-08-31 DIAGNOSIS — M542 Cervicalgia: Secondary | ICD-10-CM | POA: Diagnosis not present

## 2019-09-12 ENCOUNTER — Other Ambulatory Visit: Payer: Self-pay | Admitting: Adult Health

## 2019-09-23 ENCOUNTER — Telehealth: Payer: Self-pay | Admitting: Cardiology

## 2019-09-23 NOTE — Telephone Encounter (Signed)
Pt calling in with chest pain. States that this has been going on for a week and a half, off and on and is currently having the pain. He states he has had a heart attack before and that this does not feel like this. He states that it does wake him up at night and it is in the center of his chest. He has taken nitro for this and it does help but it keeps coming back. He states that he would like to avoid going to the ER at this time. Notified him that we do recommend with active chest pain that he see immediate evaluation in the ER. Pt states that if it gets worse he will go to be evaluated. Pt would like to see someone in office if possible, states that he doesn't think he would be able to come today but would like something for early next week. Able to schedule pt for OV with Coletta Memos NP on 09/28/19 at 3:15pm. Re educated pt that with the active chest pain we do recommend he seek treatment in the ER and pt stated again that if it became worse he would. Verbalized understanding of appt with Denyse Amass as well. No other questions at this time. Will route to MD and NP to make aware.

## 2019-09-23 NOTE — Telephone Encounter (Signed)
Hi Sarah,  I'm covering Jesse's inbox this week. I'll make sure he is aware. Thank you for sending and reiterating to patient ED recommendations.   Loel Dubonnet, NP

## 2019-09-23 NOTE — Telephone Encounter (Signed)
Pt c/o of Chest Pain: STAT if CP now or developed within 24 hours  1. Are you having CP right now? Yes   2. Are you experiencing any other symptoms (ex. SOB, nausea, vomiting, sweating)? No   3. How long have you been experiencing CP? A week and a half (progressively getting worse)  4. Is your CP continuous or coming and going? Coming and going   5. Have you taken Nitroglycerin? Yes   Aiiden is calling stating he has been experiencing CP for the past week and a half that is progressively getting worse. He states he is experiencing it now, but no other symptoms are present. He has been taking Nitroglycerin in regards to this both the past week and today. He states he would like an appt to be seen for this asap. Please advise. ?

## 2019-09-27 NOTE — Progress Notes (Deleted)
Cardiology Clinic Note   Patient Name: Richard Franco Date of Encounter: 09/27/2019  Primary Care Provider:  Rolene Course, PA-C Primary Cardiologist:  Buford Dresser, MD  Patient Profile    Richard Franco 54 year old male presents today for an evaluation of his recurrent chest pain.  Past Medical History    Past Medical History:  Diagnosis Date  . Abnormal chest CT 03/2012   a. 2013: Scattered patchy ground glass opacities and pulmonary nodules. Transbronchial biopsy with no definite etiology of lung finding- possible organizing pneumonia. b. F/u CT 05/2014: stable multiple tiny pulm nodules, no further w/u recommended per notes.   . Anxiety   . Arthritis   . CAD (coronary artery disease)   . Chronic midline posterior neck pain   . Complication of anesthesia    " difficult to urinate after "  . Depression   . Diabetes ([place])   . Diabetic retinopathy ([place])   . Esophageal stricture   . Fibromyalgia   . Fracture    back  . GERD (gastroesophageal reflux disease)   . Hypercholesteremia   . Hypertension   . Migraine headache    "none lately; I've had alot in my life" (06/21/2015)  . NSTEMI (non-ST elevated myocardial infarction) ([place]) 07/2012   a. Normal cath 03/2012. b. 07/2012: troponin 4, normal cors, ?vasospasm. Did not tolerate Imdur due to headache. On amlodipine.  . Stroke (cerebrum) ([place])   . Testosterone deficiency   . Tobacco abuse    Past Surgical History:  Procedure Laterality Date  . ANKLE FUSION Left 2006  . BACK SURGERY    . BREAST LUMPECTOMY    . CERVICAL FUSION  X7 "last one 04/2011"  . COLONOSCOPY    . CYSTECTOMY    . FRACTURE SURGERY    . LEFT HEART CATHETERIZATION WITH CORONARY ANGIOGRAM N/A 03/25/2012   Procedure: LEFT HEART CATHETERIZATION WITH CORONARY ANGIOGRAM;  Surgeon: Burnell Blanks, MD;  Location: [facility] CATH LAB;  Service: Cardiovascular;  Laterality: N/A;  . LEFT HEART CATHETERIZATION WITH CORONARY ANGIOGRAM N/A 08/11/2012   Procedure: LEFT HEART CATHETERIZATION WITH CORONARY ANGIOGRAM;  Surgeon: Peter M Martinique, MD;  Location: [facility] CATH LAB;  Service: Cardiovascular;  Laterality: N/A;  . POSTERIOR CERVICAL FUSION/FORAMINOTOMY  05/09/2011   Procedure: POSTERIOR CERVICAL FUSION/FORAMINOTOMY LEVEL 1;  Surgeon: Hosie Spangle;  Location: [facility];  Service: Neurosurgery;  Laterality: N/A;  C4/5 posterior arthrodesis with instrumentation   . UPPER GASTROINTESTINAL ENDOSCOPY    . VIDEO BRONCHOSCOPY  05/06/2012   Procedure: VIDEO BRONCHOSCOPY WITH FLUORO;  Surgeon: Jyl Heinz, MD;  Location: [facility] ENDOSCOPY;  Service: Cardiopulmonary;  Laterality: Bilateral;    Allergies  Allergies  Allergen Reactions  . Midodrine Hcl Swelling    Tongue swelling  . Sulfa Antibiotics Rash    History of Present Illness    Mr. Dafoe has a past medical history of essential hypertension, pulmonary infiltrates, GERD, dyspnea, unspecified chest pain, fibromyalgia, and hyperlipidemia.  He underwent cardiac catheterization 10/13, and 2/14 which showed normal coronary arteries.  There was a question of vasospasm at that time.  He did not tolerate.  Due to headache.  His echocardiogram 1/18 showed LVEF of 55-60% with left ventricular diastolic function parameters being normal.  He was seen by Jory Sims, DNP 4/19 and was last seen by Dr. Harrell Gave on 01/18/2019.  During that time he had concerns for severe fatigue that have been ongoing, and he was occasionally having chest pain.  He describes his chest pain  as occasional, sharp and that seems to be pleuritic in nature.  He mentioned it improved when he stopped smoking around 2 years ago.  He denied shortness of breath at rest and with exertion, orthopnea, PND and lower extremity edema.  He presented to the emergency department on 06/24/2019 with chest pain that was described as substernal, sharp, present for greater than 24 hours, and mild relief given by NTG.His EKG showed normal  sinus rhythm 74 bpm.  His chest x-ray was WNL.  His pain was nonexertional and nonradiating and troponins were negative x2.  MI was ruled out and it was suspected to be GI in nature.  Some relief was given with GI cocktail.  Diet changes recommended and refraining from NSAID use.  He called  the nurse triage line on 09/23/2019 and stated that for the last week and a half he had been having progressive chest pain.  He had been taking nitroglycerin for this discomfort.    He presents to the clinic today and states***  *** denies chest pain, shortness of breath, lower extremity edema, fatigue, palpitations, melena, hematuria, hemoptysis, diaphoresis, weakness, presyncope, syncope, orthopnea, and PND.    Home Medications    Prior to Admission medications   Medication Sig Start Date End Date Taking? Authorizing Provider  amLODipine (NORVASC) 5 MG tablet Take 5 mg by mouth daily.     [provider]  aspirin EC 81 MG EC tablet Take 1 tablet (81 mg total) by mouth daily. 08/12/12   Dunn, Nedra Hai, PA-C  esomeprazole (NEXIUM) 40 MG capsule TAKE ONE CAPSULE BY MOUTH TWICE A DAY BEFORE A MEAL. 04/15/19   Mauri Pole, MD  FLUoxetine (PROZAC) 20 MG capsule Take 20 mg by mouth 2 (two) times daily.     [provider]  HYDROcodone-acetaminophen (NORCO/VICODIN) 5-325 MG tablet Take 1-2 tablets by mouth every 6 (six) hours as needed for moderate pain or severe pain. 01/16/18   Duffy Bruce, MD  ibuprofen (ADVIL,MOTRIN) 200 MG tablet Take 600-800 mg by mouth every 6 (six) hours as needed for moderate pain.    [provider]  lamoTRIgine (LAMICTAL) 100 MG tablet Take 150 mg by mouth 2 (two) times daily.     [provider]  lidocaine (XYLOCAINE) 2 % solution Use as directed 15 mLs in the mouth or throat as needed for mouth pain. 06/24/19   Cardama, Grayce Sessions, MD  nitroGLYCERIN (NITROSTAT) 0.4 MG SL tablet DISSOLVE ONE TABLET UNDER THE TONGUE EVERY 5 MINUTES AS  NEEDED FOR CHEST PAIN. DO NOT EXCEED A TOTAL OF 3 DOSES IN 15 MINUTES 09/14/19   Lendon Colonel, NP  promethazine (PHENERGAN) 25 MG tablet Take 25 mg by mouth every 6 (six) hours as needed for nausea or vomiting.    [provider]    Family History    Family History  Problem Relation Age of Onset  . Diabetes Mother   . Cancer Mother        ?  Marland Kitchen Hypertension Mother   . Hyperlipidemia Father   . Diabetes Father   . Coronary artery disease Paternal Uncle   . Asthma Sister   . Diabetes Brother   . Hypertension Brother   . Colon cancer Neg Hx   . Colon polyps Neg Hx   . Rectal cancer Neg Hx   . Stomach cancer Neg Hx    He indicated that his mother is alive. He indicated that his father is alive. He indicated that the  status of his sister is unknown. He indicated that the status of his brother is unknown. He indicated that the status of his paternal uncle is unknown. He indicated that the status of his neg hx is unknown.  Social History    Social History   Socioeconomic History  . Marital status: Legally Separated    Spouse name: Not on file  . Number of children: Not on file  . Years of education: Not on file  . Highest education level: Not on file  Occupational History  . Not on file  Tobacco Use  . Smoking status: Former Smoker    Packs/day: 1.00    Years: 28.00    Pack years: 28.00    Types: Cigarettes    Quit date: 12/16/2017    Years since quitting: 1.7  . Smokeless tobacco: Former Systems developer    Types: Chew  Substance and Sexual Activity  . Alcohol use: No    Alcohol/week: 21.0 standard drinks    Types: 21 Cans of beer per week    Comment: 06/21/2015 "3 beers/day lately; more lately cause of being separated"   . Drug use: No  . Sexual activity: Yes  Other Topics Concern  . Not on file  Social History Narrative  . Not on file   Social Determinants of Health   Financial Resource Strain:   . Difficulty of Paying Living Expenses:   Food Insecurity:     . Worried About Charity fundraiser in the Last Year:   . Arboriculturist in the Last Year:   Transportation Needs:   . Film/video editor (Medical):   Marland Kitchen Lack of Transportation (Non-Medical):   Physical Activity:   . Days of Exercise per Week:   . Minutes of Exercise per Session:   Stress:   . Feeling of Stress :   Social Connections:   . Frequency of Communication with Friends and Family:   . Frequency of Social Gatherings with Friends and Family:   . Attends Religious Services:   . Active Member of Clubs or Organizations:   . Attends Archivist Meetings:   Marland Kitchen Marital Status:   Intimate Partner Violence:   . Fear of Current or Ex-Partner:   . Emotionally Abused:   Marland Kitchen Physically Abused:   . Sexually Abused:      Review of Systems    General:  No chills, fever, night sweats or weight changes.  Cardiovascular:  No chest pain, dyspnea on exertion, edema, orthopnea, palpitations, paroxysmal nocturnal dyspnea. Dermatological: No rash, lesions/masses Respiratory: No cough, dyspnea Urologic: No hematuria, dysuria Abdominal:   No nausea, vomiting, diarrhea, bright red blood per rectum, melena, or hematemesis Neurologic:  No visual changes, wkns, changes in mental status. All other systems reviewed and are otherwise negative except as noted above.  Physical Exam    VS:  There were no vitals taken for this visit. , BMI There is no height or weight on file to calculate BMI. GEN: Well nourished, well developed, in no acute distress. HEENT: normal. Neck: Supple, no JVD, carotid bruits, or masses. Cardiac: RRR, no murmurs, rubs, or gallops. No clubbing, cyanosis, edema.  Radials/DP/PT 2+ and equal bilaterally.  Respiratory:  Respirations regular and unlabored, clear to auscultation bilaterally. GI: Soft, nontender, nondistended, BS + x 4. MS: no deformity or atrophy. Skin: warm and dry, no rash. Neuro:  Strength and sensation are intact. Psych: Normal  affect.  Accessory Clinical Findings    ECG personally reviewed by  me today- *** - No acute changes  EKG 06/24/2019 Normal sinus rhythm 74 bpm no ST or T wave deviation.  Echocardiogram 07/05/2016 Study Conclusions   - Left ventricle: Inferobasal hypokinesis. The cavity size was  normal. Systolic function was normal. The estimated ejection  fraction was in the range of 55% to 60%. Wall motion was normal;  there were no regional wall motion abnormalities. Left  ventricular diastolic function parameters were normal.  - Atrial septum: A patent foramen ovale cannot be excluded.  Cardiopulmonary exercise test 02/23/2013 Conclusion: Exercise testing with gas exchange demonstrates a  mild functional limitation when compared to matched sedentary  norms. The patient's tidal volume during exercise appears to be a  limiting factor, couple with the low respiratory rate. The  elevated tidal volume during exercise can induce a sensation of  not being able to get a full breath and may cause chest  discomfort. There also appears to be significant deconditioning.    Assessment & Plan   1.  Atypical chest pain-no chest discomfort today.  EKG shows*** Increase amlodipine to 7.5 mg daily Continue aspirin 81 mg tablet daily Heart healthy low-sodium diet Increase physical activity as tolerated  Essential hypertension-BP today*** Increase amlodipine to 7.5 mg daily Heart healthy low-sodium diet-salty 6 given Increase physical activity as tolerated  GERD-his symptoms appear to be related to GI reflux. Continue Nexium 40 mg twice daily GERD diet-information given Weight loss Smoking cessation  Disposition: Follow-up with Dr. Harrell Gave in 6 months.  Jossie Ng. [facility] [place] [address] Office [phone] Fax [phone]

## 2019-09-28 ENCOUNTER — Ambulatory Visit: Payer: Medicare Other | Admitting: General Practice

## 2019-10-05 ENCOUNTER — Encounter: Payer: Self-pay | Admitting: General Practice

## 2019-10-23 ENCOUNTER — Emergency Department (HOSPITAL_BASED_OUTPATIENT_CLINIC_OR_DEPARTMENT_OTHER): Payer: Medicare Other

## 2019-10-23 ENCOUNTER — Emergency Department (HOSPITAL_BASED_OUTPATIENT_CLINIC_OR_DEPARTMENT_OTHER)
Admission: EM | Admit: 2019-10-23 | Discharge: 2019-10-23 | Disposition: A | Discharge status: Home / Self Care | Payer: Medicare Other | Attending: Emergency Medicine | Admitting: Emergency Medicine

## 2019-10-23 ENCOUNTER — Encounter (HOSPITAL_BASED_OUTPATIENT_CLINIC_OR_DEPARTMENT_OTHER): Payer: Self-pay | Admitting: Emergency Medicine

## 2019-10-23 ENCOUNTER — Other Ambulatory Visit: Payer: Self-pay

## 2019-10-23 DIAGNOSIS — R1011 Right upper quadrant pain: Secondary | ICD-10-CM | POA: Insufficient documentation

## 2019-10-23 DIAGNOSIS — R0789 Other chest pain: Secondary | ICD-10-CM | POA: Diagnosis not present

## 2019-10-23 DIAGNOSIS — E119 Type 2 diabetes mellitus without complications: Secondary | ICD-10-CM | POA: Insufficient documentation

## 2019-10-23 DIAGNOSIS — R6884 Jaw pain: Secondary | ICD-10-CM | POA: Insufficient documentation

## 2019-10-23 DIAGNOSIS — Z79899 Other long term (current) drug therapy: Secondary | ICD-10-CM | POA: Insufficient documentation

## 2019-10-23 DIAGNOSIS — R110 Nausea: Secondary | ICD-10-CM | POA: Diagnosis not present

## 2019-10-23 DIAGNOSIS — R0602 Shortness of breath: Secondary | ICD-10-CM | POA: Diagnosis not present

## 2019-10-23 DIAGNOSIS — Z87891 Personal history of nicotine dependence: Secondary | ICD-10-CM | POA: Diagnosis not present

## 2019-10-23 DIAGNOSIS — Z7982 Long term (current) use of aspirin: Secondary | ICD-10-CM | POA: Insufficient documentation

## 2019-10-23 DIAGNOSIS — I2510 Atherosclerotic heart disease of native coronary artery without angina pectoris: Secondary | ICD-10-CM | POA: Insufficient documentation

## 2019-10-23 DIAGNOSIS — I10 Essential (primary) hypertension: Secondary | ICD-10-CM | POA: Diagnosis not present

## 2019-10-23 DIAGNOSIS — R079 Chest pain, unspecified: Secondary | ICD-10-CM | POA: Diagnosis not present

## 2019-10-23 DIAGNOSIS — R52 Pain, unspecified: Secondary | ICD-10-CM

## 2019-10-23 DIAGNOSIS — R109 Unspecified abdominal pain: Secondary | ICD-10-CM | POA: Diagnosis not present

## 2019-10-23 HISTORY — DX: Chronic pain syndrome: G89.4

## 2019-10-23 LAB — CBC
HCT: 39.8 % (ref 39.0–52.0)
Hemoglobin: 13.7 g/dL (ref 13.0–17.0)
MCH: 30.2 pg (ref 26.0–34.0)
MCHC: 34.4 g/dL (ref 30.0–36.0)
MCV: 87.7 fL (ref 80.0–100.0)
Platelets: 171 10*3/uL (ref 150–400)
RBC: 4.54 MIL/uL (ref 4.22–5.81)
RDW: 12.9 % (ref 11.5–15.5)
WBC: 4.4 10*3/uL (ref 4.0–10.5)
nRBC: 0 % (ref 0.0–0.2)

## 2019-10-23 LAB — I-STAT CHEM 8, ED
BUN: 10 mg/dL (ref 6–20)
Calcium, Ion: 1.08 mmol/L — ABNORMAL LOW (ref 1.15–1.40)
Chloride: 105 mmol/L (ref 98–111)
Creatinine, Ser: 1.2 mg/dL (ref 0.61–1.24)
Glucose, Bld: 82 mg/dL (ref 70–99)
HCT: 37 % — ABNORMAL LOW (ref 39.0–52.0)
Hemoglobin: 12.6 g/dL — ABNORMAL LOW (ref 13.0–17.0)
Potassium: 4.3 mmol/L (ref 3.5–5.1)
Sodium: 136 mmol/L (ref 135–145)
TCO2: 29 mmol/L (ref 22–32)

## 2019-10-23 LAB — TROPONIN I (HIGH SENSITIVITY)
Troponin I (High Sensitivity): 2 ng/L (ref <18)
Troponin I (High Sensitivity): 2 ng/L (ref <18)

## 2019-10-23 MED ORDER — MORPHINE SULFATE (PF) 4 MG/ML IV SOLN
4.0000 mg | Freq: Once | INTRAVENOUS | Status: AC
  Administered 2019-10-23: 4 mg via INTRAVENOUS
  Filled 2019-10-23: qty 1

## 2019-10-23 MED ORDER — ONDANSETRON 4 MG PO TBDP
4.0000 mg | ORAL_TABLET | Freq: Once | ORAL | Status: AC
  Administered 2019-10-23: 4 mg via ORAL
  Filled 2019-10-23: qty 1

## 2019-10-23 MED ORDER — LIDOCAINE VISCOUS HCL 2 % MT SOLN
15.0000 mL | Freq: Once | OROMUCOSAL | Status: AC
  Administered 2019-10-23: 15 mL via ORAL
  Filled 2019-10-23: qty 15

## 2019-10-23 MED ORDER — ALUM & MAG HYDROXIDE-SIMETH 200-200-20 MG/5ML PO SUSP
30.0000 mL | Freq: Once | ORAL | Status: AC
  Administered 2019-10-23: 30 mL via ORAL
  Filled 2019-10-23: qty 30

## 2019-10-23 MED ORDER — ASPIRIN 81 MG PO CHEW
324.0000 mg | CHEWABLE_TABLET | Freq: Once | ORAL | Status: AC
  Administered 2019-10-23: 324 mg via ORAL
  Filled 2019-10-23: qty 4

## 2019-10-23 MED ORDER — DIAZEPAM 5 MG/ML IJ SOLN
5.0000 mg | Freq: Once | INTRAMUSCULAR | Status: AC
  Administered 2019-10-23: 5 mg via INTRAVENOUS
  Filled 2019-10-23: qty 2

## 2019-10-23 MED ORDER — HYDROXYZINE HCL 25 MG PO TABS
25.0000 mg | ORAL_TABLET | Freq: Four times a day (QID) | ORAL | 0 refills | Status: DC
Start: 2019-10-23 — End: 2020-11-15

## 2019-10-23 NOTE — ED Notes (Signed)
Pt transported to [facility]

## 2019-10-23 NOTE — Discharge Instructions (Addendum)
Take hydroxyzine as needed as prescribed.  Follow-up with your primary care provider, consider follow-up with your cardiologist.  Return to ER for new or worsening symptoms.

## 2019-10-23 NOTE — ED Triage Notes (Signed)
States," I have a had a heart attack and my chest has been hurting for the past 3 days " Sharp pain to mid sternal area. Took Nitro 3 tabs today

## 2019-10-23 NOTE — ED Provider Notes (Signed)
[facility] EMERGENCY DEPARTMENT Provider Note   CSN: [id number] Arrival date & time: 10/23/19  1046     History Chief Complaint  Patient presents with  . Chest Pain    Richard Franco is a 54 y.o. male.  54 year old male with complaint of chest pain, woke from sleep today, has taken 3 nitro without relief. Onset 3 days ago while at work. Works as a Chief Strategy Officer, no specific heavy activity at the time, had to rest and take a nitro in the truck, pain resolved then returned. Reports taking 3 nitro daily, pain lasts for about 10-20 mintues, resolves with taking the Nitro and then pain returns about 102 hours later. Reports SHOB yesterday with the pain, also nausea without vomiting at night. Took zofran for the nausea with improvement.  Denies nausea at this time. Not SHOB at this time. Pain located mid sternum, sharp in nature, radiates from central chest to right/left chest. Reports pain to both sides jaw, feels uncomfortable. Denies diaphoresis. History of HTN, hyperlipidemia (borderline, does not take medicine). No history of diabetes. No alcohol use, denies drug use. Family history significant for- none.  NSTEMI in 2014, normal cath, thought to be due to vasospasm.      HPI: A 54 year old patient with a history of hypertension and hypercholesterolemia presents for evaluation of chest pain. Initial onset of pain was approximately 1-3 hours ago. The patient's chest pain is well-localized, is sharp and is not worse with exertion. The patient complains of nausea. The patient's chest pain is middle- or left-sided, is not described as heaviness/pressure/tightness and does radiate to the arms/jaw/neck. The patient denies diaphoresis. The patient has no history of stroke, has no history of peripheral artery disease, has not smoked in the past 90 days, denies any history of treated diabetes, has no relevant family history of coronary artery disease (first degree relative at less than age 48)  and does not have an elevated BMI (>=30).   Past Medical History:  Diagnosis Date  . Abnormal chest CT 03/2012   a. 2013: Scattered patchy ground glass opacities and pulmonary nodules. Transbronchial biopsy with no definite etiology of lung finding- possible organizing pneumonia. b. F/u CT 05/2014: stable multiple tiny pulm nodules, no further w/u recommended per notes.   . Anxiety   . Arthritis   . CAD (coronary artery disease)   . Chronic midline posterior neck pain   . Chronic pain disorder   . Complication of anesthesia    " difficult to urinate after "  . Depression   . Diabetes ([place])   . Diabetic retinopathy ([place])   . Esophageal stricture   . Fibromyalgia   . Fracture    back  . GERD (gastroesophageal reflux disease)   . Hypercholesteremia   . Hypertension   . Migraine headache    "none lately; I've had alot in my life" (06/21/2015)  . NSTEMI (non-ST elevated myocardial infarction) ([place]) 07/2012   a. Normal cath 03/2012. b. 07/2012: troponin 4, normal cors, ?vasospasm. Did not tolerate Imdur due to headache. On amlodipine.  . Stroke (cerebrum) ([place])   . Testosterone deficiency   . Tobacco abuse     Patient Active Problem List   Diagnosis Date Noted  . Throat swelling 07/06/2016  . Cocaine use 07/06/2016  . ETOH abuse 07/06/2016  . Dyspnea on exertion 07/04/2016  . Chest pain 07/04/2016  . Essential hypertension 07/04/2016  . AKI (acute kidney injury) ([place]) 07/04/2016  . Chest pain, atypical 11/16/2015  .  Pleuritic chest pain 06/22/2015  . Tobacco abuse 06/22/2015  . History of coronary vasospasm 06/22/2015  . Smoking history 07/19/2014  . Chronic fatigue 07/19/2014  . Chronic arthralgias of knees and hips 11/26/2012  . Hyperlipidemia 09/25/2012  . GERD (gastroesophageal reflux disease) 08/11/2012  . BPH (benign prostatic hyperplasia) 08/11/2012  . Abnormal immunological finding in serum 05/03/2012  . Pulmonary infiltrates 04/16/2012  . Pulmonary nodule  03/25/2012  . Abnormal CT lung screening 03/25/2012  . History of tobacco abuse 02/18/2012  . Fibromyalgia 02/18/2012  . Hx of migraine headaches 02/18/2012  . DYSPNEA 08/24/2009  . CHEST PAIN-UNSPECIFIED 08/24/2009  . ABFND, FALSE POSITIVE SEROLOGIC TEST, [facility] 01/12/2007  . OSTEOARTHRITIS 01/05/2007    Past Surgical History:  Procedure Laterality Date  . ANKLE FUSION Left 2006  . BACK SURGERY    . BREAST LUMPECTOMY    . CERVICAL FUSION  X7 "last one 04/2011"  . COLONOSCOPY    . CYSTECTOMY    . FRACTURE SURGERY    . LEFT HEART CATHETERIZATION WITH CORONARY ANGIOGRAM N/A 03/25/2012   Procedure: LEFT HEART CATHETERIZATION WITH CORONARY ANGIOGRAM;  Surgeon: Burnell Blanks, MD;  Location: [facility] CATH LAB;  Service: Cardiovascular;  Laterality: N/A;  . LEFT HEART CATHETERIZATION WITH CORONARY ANGIOGRAM N/A 08/11/2012   Procedure: LEFT HEART CATHETERIZATION WITH CORONARY ANGIOGRAM;  Surgeon: Peter M Martinique, MD;  Location: [facility] CATH LAB;  Service: Cardiovascular;  Laterality: N/A;  . POSTERIOR CERVICAL FUSION/FORAMINOTOMY  05/09/2011   Procedure: POSTERIOR CERVICAL FUSION/FORAMINOTOMY LEVEL 1;  Surgeon: Hosie Spangle;  Location: [facility] NEURO ORS;  Service: Neurosurgery;  Laterality: N/A;  C4/5 posterior arthrodesis with instrumentation   . UPPER GASTROINTESTINAL ENDOSCOPY    . VIDEO BRONCHOSCOPY  05/06/2012   Procedure: VIDEO BRONCHOSCOPY WITH FLUORO;  Surgeon: Jyl Heinz, MD;  Location: [facility] ENDOSCOPY;  Service: Cardiopulmonary;  Laterality: Bilateral;       Family History  Problem Relation Age of Onset  . Diabetes Mother   . Cancer Mother        ?  Marland Kitchen Hypertension Mother   . Hyperlipidemia Father   . Diabetes Father   . Coronary artery disease Paternal Uncle   . Asthma Sister   . Diabetes Brother   . Hypertension Brother   . Colon cancer Neg Hx   . Colon polyps Neg Hx   . Rectal cancer Neg Hx   . Stomach cancer Neg Hx     Social History   Tobacco Use  . Smoking  status: Former Smoker    Packs/day: 1.00    Years: 28.00    Pack years: 28.00    Types: Cigarettes    Quit date: 12/16/2017    Years since quitting: 1.8  . Smokeless tobacco: Former Systems developer    Types: Chew  Substance Use Topics  . Alcohol use: Not Currently    Alcohol/week: 21.0 standard drinks    Types: 21 Cans of beer per week    Comment: 06/21/2015 "3 beers/day lately; more lately cause of being separated"   . Drug use: No    Home Medications Prior to Admission medications   Medication Sig Start Date End Date Taking? Authorizing Provider  aspirin EC 81 MG EC tablet Take 1 tablet (81 mg total) by mouth daily. 08/12/12  Yes Dunn, Dayna N, PA-C  esomeprazole (NEXIUM) 40 MG capsule TAKE ONE CAPSULE BY MOUTH TWICE A DAY BEFORE A MEAL. 04/15/19  Yes Nandigam, Venia Minks, MD  FLUoxetine (PROZAC) 20 MG capsule Take 20 mg by  mouth 2 (two) times daily.    Yes [provider]  HYDROcodone-acetaminophen (NORCO/VICODIN) 5-325 MG tablet Take 1-2 tablets by mouth every 6 (six) hours as needed for moderate pain or severe pain. 01/16/18  Yes Duffy Bruce, MD  lamoTRIgine (LAMICTAL) 100 MG tablet Take 150 mg by mouth 2 (two) times daily.    Yes [provider]  nitroGLYCERIN (NITROSTAT) 0.4 MG SL tablet DISSOLVE ONE TABLET UNDER THE TONGUE EVERY 5 MINUTES AS NEEDED FOR CHEST PAIN. DO NOT EXCEED A TOTAL OF 3 DOSES IN 15 MINUTES 09/14/19  Yes Lendon Colonel, NP  amLODipine (NORVASC) 5 MG tablet Take 5 mg by mouth daily.     [provider]  hydrOXYzine (ATARAX/VISTARIL) 25 MG tablet Take 1 tablet (25 mg total) by mouth every 6 (six) hours. 10/23/19   Tacy Learn, PA-C  ibuprofen (ADVIL,MOTRIN) 200 MG tablet Take 600-800 mg by mouth every 6 (six) hours as needed for moderate pain.    [provider]  lidocaine (XYLOCAINE) 2 % solution Use as directed 15 mLs in the mouth or throat as needed for mouth pain. 06/24/19   Fatima Blank, MD  promethazine (PHENERGAN)  25 MG tablet Take 25 mg by mouth every 6 (six) hours as needed for nausea or vomiting.    [provider]    Allergies    Midodrine hcl and Sulfa antibiotics  Review of Systems   Review of Systems  Constitutional: Negative for chills, diaphoresis and fever.  Respiratory: Negative for shortness of breath.   Cardiovascular: Positive for chest pain.  Gastrointestinal: Positive for nausea. Negative for abdominal pain, constipation, diarrhea and vomiting.  Musculoskeletal: Negative for arthralgias and myalgias.  Skin: Negative for rash and wound.  Allergic/Immunologic: Negative for immunocompromised state.  Neurological: Negative for weakness.  Hematological: Negative for adenopathy.  Psychiatric/Behavioral: Negative for confusion.  All other systems reviewed and are negative.   Physical Exam Updated Vital Signs BP 115/69   Pulse 64   Temp 99 F (37.2 C) (Oral)   Resp 15   Ht 5\' 8"  (1.727 m)   Wt 82 kg   SpO2 95%   BMI 27.49 kg/m   Physical Exam Vitals and nursing note reviewed.  Constitutional:      General: He is not in acute distress.    Appearance: He is well-developed. He is not diaphoretic.  HENT:     Head: Normocephalic and atraumatic.  Cardiovascular:     Rate and Rhythm: Normal rate and regular rhythm.     Heart sounds: Normal heart sounds.  Pulmonary:     Effort: Pulmonary effort is normal.     Breath sounds: Normal breath sounds.  Chest:     Chest wall: No tenderness.  Abdominal:     Palpations: Abdomen is soft.     Tenderness: There is no abdominal tenderness.  Musculoskeletal:     Right lower leg: No edema.     Left lower leg: No edema.  Skin:    General: Skin is warm and dry.     Findings: No erythema or rash.  Neurological:     Mental Status: He is alert and oriented to person, place, and time.  Psychiatric:        Behavior: Behavior normal.     ED Results / Procedures / Treatments   Labs (all labs ordered are listed, but only  abnormal results are displayed) Labs Reviewed  I-STAT CHEM 8, ED - Abnormal; Notable for the following components:  Result Value   Calcium, Ion 1.08 (*)    Hemoglobin 12.6 (*)    HCT 37.0 (*)    All other components within normal limits  CBC  TROPONIN I (HIGH SENSITIVITY)  TROPONIN I (HIGH SENSITIVITY)    EKG EKG Interpretation  Date/Time:  Saturday Oct 23 2019 10:54:29 EDT Ventricular Rate:  85 PR Interval:    QRS Duration: 93 QT Interval:  375 QTC Calculation: 446 R Axis:   73 Text Interpretation: Sinus rhythm No STEMI Confirmed by Octaviano Glow [phone]) on 10/23/2019 11:20:10 AM   Radiology DG Chest 2 View  Result Date: 10/23/2019 CLINICAL DATA:  Chest pain EXAM: CHEST - 2 VIEW COMPARISON:  06/23/2019 FINDINGS: The heart size and mediastinal contours are within normal limits. Both lungs are clear. The visualized skeletal structures are unremarkable. IMPRESSION: No acute abnormality of the lungs. Electronically Signed   By: Eddie Candle M.D.   On: 10/23/2019 12:30   US Abdomen Limited RUQ  Result Date: 10/23/2019 CLINICAL DATA:  Abdominal pain for 3 days EXAM: ULTRASOUND ABDOMEN LIMITED RIGHT UPPER QUADRANT COMPARISON:  10/23/2015 FINDINGS: Gallbladder: No gallstones or wall thickening visualized. No sonographic Murphy sign noted by sonographer. Common bile duct: Diameter: 0.6 cm Liver: No focal lesion identified. Within normal limits in parenchymal echogenicity. Portal vein is patent on color Doppler imaging with normal direction of blood flow towards the liver. Other: None. IMPRESSION: Upper normal size common bile duct at 0.6 cm (formerly 0.5 cm on 10/23/2015). Otherwise unremarkable exam. Electronically Signed   By: Van Clines M.D.   On: 10/23/2019 15:27    Procedures Procedures (including critical care time)  Medications Ordered in ED Medications  aspirin chewable tablet 324 mg (324 mg Oral Given 10/23/19 1137)  alum & mag hydroxide-simeth (MAALOX/MYLANTA)  200-200-20 MG/5ML suspension 30 mL (30 mLs Oral Given 10/23/19 1229)    And  lidocaine (XYLOCAINE) 2 % viscous mouth solution 15 mL (15 mLs Oral Given 10/23/19 1229)  morphine 4 MG/ML injection 4 mg (4 mg Intravenous Given 10/23/19 1320)  ondansetron (ZOFRAN-ODT) disintegrating tablet 4 mg (4 mg Oral Given 10/23/19 1320)  diazepam (VALIUM) injection 5 mg (5 mg Intravenous Given 10/23/19 1408)    ED Course  I have reviewed the triage vital signs and the nursing notes.  Pertinent labs & imaging results that were available during my care of the patient were reviewed by me and considered in my medical decision making (see chart for details).  Clinical Course as of Oct 23 1555  Sat Oct 23, 2019  1401 54yo male with complaint of chest pain as noted above. On exam, no chest wall tenderness, no abdominal pain, normal pulses. EKG NSR, no STEMI. CBC WNL, I-stat chem 8 (traditional chem not available at this time) no significant findings. Initial troponin negative (2), delta trop pending. Patient has been taking nitro today without relief. Given ASA without improvement, GI cocktail without relief (multiple prior ER visits with similar presentation and improved with GI tx). Given Morphine without improvement. Called to room no relief with treatments given so far. Patient's wife questions gallstones, no prior work up for same, no abdominal tenderness, negative murphy sign on repeat abdominal exam.  Will check RUQ [place] while awaiting delta trop.    [LM]  1556 Delta troponin unchanged, hear score of 3, do not suspect ACS today. Pain has improved with Valium.  Ultrasound right upper quadrant unremarkable.  Discussed with patient, recommend follow-up with PCP, consider following up with his cardiologist.  Patient  given prescription for hydroxyzine to take as needed.   [LM]    Clinical Course User Index [LM] Roque Lias   MDM Rules/Calculators/A&P HEAR Score: 3                    Final Clinical  Impression(s) / ED Diagnoses Final diagnoses:  Pain  Atypical chest pain    Rx / DC Orders ED Discharge Orders         Ordered    hydrOXYzine (ATARAX/VISTARIL) 25 MG tablet  Every 6 hours     10/23/19 1555           Tacy Learn, PA-C 10/23/19 1557    Wyvonnia Dusky, MD 10/23/19 [phone]

## 2019-10-25 ENCOUNTER — Other Ambulatory Visit (INDEPENDENT_AMBULATORY_CARE_PROVIDER_SITE_OTHER): Payer: Medicare Other

## 2019-10-25 ENCOUNTER — Other Ambulatory Visit: Payer: Self-pay

## 2019-10-25 ENCOUNTER — Encounter: Payer: Self-pay | Admitting: Internal Medicine

## 2019-10-25 ENCOUNTER — Ambulatory Visit (INDEPENDENT_AMBULATORY_CARE_PROVIDER_SITE_OTHER): Payer: Medicare Other | Admitting: Internal Medicine

## 2019-10-25 VITALS — BP 116/82 | HR 79 | Temp 97.1°F | Ht 68.0 in | Wt 177.6 lb

## 2019-10-25 DIAGNOSIS — R0609 Other forms of dyspnea: Secondary | ICD-10-CM

## 2019-10-25 DIAGNOSIS — R079 Chest pain, unspecified: Secondary | ICD-10-CM

## 2019-10-25 DIAGNOSIS — R0600 Dyspnea, unspecified: Secondary | ICD-10-CM

## 2019-10-25 LAB — SEDIMENTATION RATE: Sed Rate: 20 mm/hr (ref 0–20)

## 2019-10-25 NOTE — Progress Notes (Signed)
HPI 54 year old male with PMH of pericarditis one year ago of unknown reason who persistently had CP since. Presented to the cardiologist with atypical chest pain, somewhat pleuritic in nature.  Cath was performed that was normal. CTA of the chest was done that revealed 3 <1 cm pulmonary nodules and multiple areas of GGO.  Patient has been more SOB over the past 3 months. On further enquires he states that he noticed shortness of breath over the past several years, at rest and with exertion, associated with chest tightnes. Extensive cardiology evaluation did not reveal a cardiac etiology. In average reports daily symptoms,  2-3 times a day, waking up approximately 2 nights per month with dyspnea.  During hospitalization he was initiated on steroids. He endorsed improved symptoms with steroids. Denies cough or wheezing, but does states that sometimes when he is short of breath he hears a noise coming from his throat.   04/02/2012 Steroids tapered off. He did not not report a significant difference off steroids.   04/16/2012 Was seen by Dr. Melvyn Novas in clinic; CT chest showed persistent lung nodules and ground glass opacities in a different territory distribution. Steroids restarted at 20 mg daily; the patient was instructed to taper them to 10 mg.     04/30/12 The patient on 10 mg of prednisone daily;   05/06/12 Bronchoscopy with BAL, cell count and TBBx of the lingula  BAL cell count with elevated macrophages (possible COP, Hypersensitivity pneumonitis vs chronic microaspiration, possible Aluminum exposure)  RVP neg Viral culture neg  AFB smear neg, culture negative   Cytology/pathology benign with no evidence of aspiration or ILD    05/15/12  Remains on 10 mg steroids;  -methacholine challenge neg -barium swallow WNL  07/02/11 present visit -CT chest with resolution of GGO  - persistent nodules  -steroids decreased to 5 mg   09/16/12  -CT with no GGO, lung nodules stable  -advised  to stop steroids  Complains of shortness of breath at rest and with exertion; chest tightness but no wheezing; Denies cough, sputum production, hemoptysis, fever, chills, rash, PND, orthopnea, LE edema, calf tenderness or LE edema. Has had muscle pain after initiating statins; in the interim resolved.   Since the previous visit he presented to the ED and was found to have elevated troponins heart catheterization negative for CAD. He continues to have chest tightness, initiated cardizem and now reports feeling better.   Acute OV 11/02/12 --  Hx of dyspnea, chest discomfort felt to be coronary vasospasm, GGI on CT chest that resolved as of 08/2012 CT scan. Presents today c/o about 3 weeks of tachycardia w exertion, some cough for ~ a week, usually non-productive, chest tightness. Saw some dark specks in his mucous several times w cough 10/22/12, no over blood, none since then. He is having nasal congestion and allergy sx. No sick contacts. He stopped prednisone in mid March. Of note he has had progressive joint pain in his fingers and hands since stopping the prednisone.   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 No other autoimmune manifestations and no changes in environment. No drug use.   Occupation: toxic exposure to aluminum, steel, plastic dust Travels:no travels abroad TB exposure:denies Pets: none  REC Please start loratadine 73m daily  Please start fluticasone nasal spray, 2 sprays each side twice a day  CXR today  Follow with Dr RChase Callerin 4 weeks to assess your  symptoms. You may need to discuss repeating your CT scan of the chest at that time.    OV 01/14/2013   He has multiple issues which include arthritis, fibromyalgia, depression, GERD, anxiety, migraines, and tobacco abuse (quit smoking 9 months ago). He has had a recent NSTEMI with cath showing normal coronary arteries, EF of 55 to 65%. This was felt to be  due to vasospasm. D dimer was positive but CT was negative for PE. He has had a chronically abnormal chest CT. March 2014: shown stable scattered bilateral pulmonary nodules most c/w underlying infectious/inflammatory or granulomatous proces 75m - 635m plan to repeat in 6-12 months which would be Oct 2014/March 2015   Now following with pumonray. Transition of care from Dr AlConception Chancyo Dr RaChase CallerSince prior pulmonary visit in the interim Has see rheum Dr beAmil Amenecently an dplaced on 10 day prednisone. Is having autoimmune panel repeated by rheumatology This is for diffuse joint pain and some myalgia.  In terms of pulmonary symptoms: Still has chronic early morning chest tightness and not relieved by albuterol. Refrains from NTG use .,.Adiditonally still with moderate dyspnea on exertion, esp for hills at golf. Relieved by rest. Unknown if releived by NTG; refrains. Associated chest ttightness + No wheeze or cough or syncope. STable. Insidious onset of months to years. No PND or orthopnea  [employer]  reports that he quit smoking about 9 months ago. His smoking use included Cigarettes. He has a 27 pack-year smoking history. He has quit using smokeless tobacco. His smokeless tobacco use included Chew.   Labs  normal spirometry oct and  nov 2013 Oct 2013: autoimmne ANA, ACE, ANCA, RF, - negative March 2014 CT chest: clearing GGI but small nodules 63m663m 2  REC Pleae have cardiopulmonary stress tst  Return to see me after that    OV 03/01/2013 Follow up dyspnea and fatigue. He had cardiopulmonary stress test on 02/23/2013 and the results of the lobe and suggest physical deconditioning as a cause for dyspnea and fatigue. He surprised that the result because he says that he does activities of daily living and is doing yard work and also walk in the[employer] other work in [employer], he states that symptoms have been persisting since 2006/2008 and he had a viral illness following exposure to  his son with  respiratory viru and who in turn was exposed to a kid with measles. He states that all his dyspnea and fatigue his since then even though he recovered from the acute illness. He recollects being infectious diseases at [place] and recollects changing diagnoses in 2008. He starts are not available to me but I do note that so the results are mentioned in July 2008. He was finally told that he had parvovirus infection.    also he states that he is known to have low testosterone and prior thyroid issues but does not have endocrinologist on record  CPST 02/23/2013  Procedure: This patient underwent staged symptom-limited exercise testing using a individualized bike protocol with expired gas analysis metabolic evaluation during exercise.  Demographics  Age: 39 8. (in.) 69 11. (lb) 178 BMI: 27.1 Predicted Peak VO2: 33.6 ml/kg/min  Gender: Male Ht (cm) 172.7 Wt. (kg) 80.7    Results  Pre-Exercise PFTs   FVC 4.87 (101%)   FEV1 3.93 (103%)   FEV1/FVC 81   MVV 162 (107%)  Post-Exercise PFTs (from lowest post-exercise trial (%change from rest))  FVC 4.87 (0%)   FEV1 3.95 (0%)  FEV1/FVC 80%    Exercise Time: 9:24 Watts: 180  RPE: 20  Reason stopped: Patient stopped due to leg fatigue and dyspnea (7/10)  Additional symptoms: Chest discomfort (upper left chest) 4/10  Resting HR: 72 Peak HR: 151 (87% age predicted max HR)  BP rest: 113/78 BP peak: 190/64  Peak VO2: 25.6 (76.2% predicted peak VO2)  VE/VCO2 slope: 31.2  OUES: 1.98  Peak RER: 1.28  Ventilatory Threshold: 11.3 (33.6% predicted peak VO2)  Peak RR 30  Peak Ventilation: 82.8  VE/MVV: 51.2%  PETCO2 at peak: 37  O2pulse: 14 (88% predicted O2pulse)   Interpretation  Notes: Patient gave a very good effort. The pulse-oximetry remained at 96% or greater throughout the exercise. Exercise was performed on a cycle ergometer starting at [facility] and increasing by 20 W/min.  ECG: Resting  ECG in normal sinus rhythm. There was an adequate HR response to the exercise with no ST-T changes or arrhythmias, however patient did not reach age predicted maximal HR. The BP response was appropriate.  PFT: Resting spirometry within normal limits. The MVV was normal. Post-exercise spirometry was performed IPE, 5, 10, 15 and 20 (reported above) minutes of recovery with no significant changes from rest to indicate respiratory muscle fatigue or EIB.  CPX: The RER of 1.28 indicates a maximal effort. The peak VO2 is mildly reduced at 25.6 ml/kg/min (76% of the age/gender/weight matched sedentary norm). The VO2 a the ventilatory threshold was well below normal at 33.6% of the predicted peak VO2. At peak exercise the ventilation was 51% of the measured MVV indicating ventilatory reserve remained (respiratory rate was below the expected range, Vt/IC [90%] was at its limit, PETCO2 was high normal). There was an overall inappropriate HR response to the exercise with a HR reserve of 23 bpm. The O2pulse (a surrogate for stroke volume) increased throughout the exercise reaching 14 ml/beat (88% of predicted). The VE/VCO2 slope was mildly elevated suggesting some reduced ventilatory efficiency. The oxygen uptake efficiency slope (OUES) was mildly reduced and reflects the patient's measured functional capacity.  Conclusion: Exercise testing with gas exchange demonstrates a mild functional limitation when compared to matched sedentary norms. The patient's tidal volume during exercise appears to be a limiting factor, couple with the low respiratory rate. The elevated tidal volume during exercise can induce a sensation of not being able to get a full breath and may cause chest discomfort. There also appears to be significant deconditioning. ^^^Preliminary CPX Results, Finalized results will be forwarded when completed by interpreting physician.^^^  Prepared by: Linzie Collin, MEd, ACSM-RCEP Sr.  Exercise Physiologist 02/23/2013 2:52 PM   REC  Your breathing test suggests physical deconditioning whic could be due to the unexplained viral illness you had several years ago ATt[facility] pulmonary [facility] REfer Dr Chalmers Cater for endocrine causes of fatgue  REturn early Dec 2014 wih CT chest   OV 06/14/2013  FU   Dyspnea/fatigue and lung nodule  Dyspnea/FAigute  - stome improved. Beginning to workout. DR balan has him on LOw T replacement. STill he is not satisfied with level of improvement. Also describing some early moprning chest tightness in front of sternum when he gets up x 1 month. No reprodyucible tenderness. No fever. No chills. Reivew of records indicate that [place] have started him on prednisone in OCt 2013 but stopped March 2014. This was for GGO in lung. However, in July 2014 he indicated rheum Dr Amil Amen started him on predinose. Review of med list shows that he  is not in it right now  CT chest 06/04/13  IMPRESSION:  1. Stable small, nonspecific pulmonary nodules. Its is these nodules  have remained unchanged since 03/24/2012. The largest nodule  measures 5 mm. If this patient is at low risk for lung cancer then  no further followup is necessary. In a patient with increased risk  for lung cancer documented stability for 18-24 months is advised.  This recommendation follows the consensus statement: Guidelines for  Management of Small Pulmonary Nodules Detected on CT Scans: A  Statement from the [employer] as published in Radiology  2005; 237:395-400.  Electronically Signed  By: Kerby Moors M.D.  On: 06/04/2013 13:35          Result Notes    Notes Recorded by Brand Males, MD on 06/09/2013 at 1:02 AM Will disuccc CT results ov 06/14/13. Nodules stable x 1 year. Next Ct in 37month or so     OV 07/19/2014  Chief Complaint  Patient presents with  . Follow-up    Pt stated his breathing is unchanged since last OV. Pt c/o DOE and CP  when active. Pt denies cough.    One-year follow-up for pulmonary nodule in association with dyspnea, fatigue and prior smoking history  This past one year he says that he does not have dyspnea anymore or any respiratory symptoms for that matter. However, he continues to have chronic fatigue associated with arthralgia. He has not followed up with his rheumatologist Dr. BAmil Amenwas previously deemed that he has not found to have any autoimmune disease. He has not followed up with Dr. BChalmers Caterhis endocrinologist. He continues on testosterone patch but this does not seem to help him. Overall he is frustrated by situation. Smoking continues to be in remission  I did elicit from him that as a young man he used to work out excessively in the gym lifting weights and doing aerobics. However in the last several years she's not doing this anymore and he is physically deconditioned. It is possible that this is an etiology for his fatigue    OV 11/16/2015  Chief Complaint  Patient presents with  . Acute Visit    Pt c/o some CP with inspiration x 1 week. Pt notes some SOB with exertion but nothing more increased than normal. Pt notes some pressure center chest when laying flat. Denies cough and wheeze.     Not seen him in over one year. Is always had a complex symptomatology for which we never had good reasoning. He now complains about atypical chest pain that is present anteriorly that is constant. Occasionally has relief. It is been going on for 10 days. It is mild to moderate in intensity. It is new. It is different from previous symptoms. It can get worse occasionally when he takes a deep breath. There no clear cut relieving factors. He has chronic shortness of breath and fatigue that has really not resolve. Review of the past medical record shows that he visited GI in April 2017. They noticed weight loss. If he gets significant acid reflux. Off note he has a strong history of cervical disc disease. He had  extensive cervical surgery with fusion of multiple disks over 15 years ago. He has limited range of motion of his neck. He denies any thoracic disc pain. There is no associated wheezing  Exhaled nitric oxide today in the office i 11 ppb and normal  Is not too keen about having extensive workup for these symptoms.  Chief Complaint  Patient presents with  . Follow-up    Hosp follow up     Referring provider: Chipper Herb, MD  HPI: 54 year old male smoker followed for dyspnea and GGO on CT with initial presentation 2013 ? COP /smoking related  -previously resolved with steroids . Extensive workup for dyspnea.  Se[facility] by Dr. Randol Kern 2014.   TEST /Significant events  07/02/11 present visit -CT chest with resolution of GGO  - persistent nodules  -steroids decreased to 5 mg  05/06/12 Bronchoscopy with BAL, cell count and TBBx of the lingula  BAL cell count with elevated macrophages (possible COP, Hypersensitivity pneumonitis vs chronic microaspiration, possible Aluminum exposure)  RVP neg Viral culture neg  AFB smear neg, culture negative  Cytology/pathology benign with no evidence of aspiration or ILD  /22/13 Remains on 10 mg steroids;  -methacholine challenge neg -barium swallow WNL Alpha 1 MS 125   09/16/12  -CT with no GGO, lung nodules stable  Previous cardiopulmonary stress test in 2014 showed deconditioning. 2D echo in 2013 showed grade 1 diastolic dysfunction with EF of 55-60%. Coronary angiography in 2014 showed normal coronary anatomy 04/2016 normal with no restriction or airflow obstruction  2017 Exhaled nitric oxide today in the office i 11 ppb and normal CT chest 01/2016 showed GGO bilaterally greatest in RML/RUL .  CT chest 12/21 showed complete resolution of GGO   07/23/2016 Follow up : GGO on CT -Prob COP (smoking related) -steroid responsive.  Pt returns for post hospital follow up . Pt was admitted 2 weeks ago for atypical chest pain . Cardiac  enzymes were neg. Echo showed nml EF w/ on wall motion abn. Drug screen was positive for Cocaine .  Pt returns today , says since discharge he says chest pain has resolved and no reoccurence . He has not smoking cigarettes. Discussed cessation and cocaine use /dangers./along drug cessation  He says he continues to have no energy .  Denies cough .  He is currently on prednisone 41m (slow taper ) for suspected COP . Last CT chest and CXR showed resolution of GGO.  We discussed further taper to off.    OV 12/03/2017  Chief Complaint  Patient presents with  . Acute Visit    Pt has c/o pain in his chest when he breathes which he has noticed x3 months which has become worse. Pt also states he has severe fatigue.    APerry Franco, 54y.o. , with dob [id number] male ,Not Hispanic or Latino from [id number]- presents to pulm clinic for ongoing and new issues.  I personally have not seen Richard Franco nearly 2 years.  At this point in time he tells me that he continues to have ongoing shortness of breath with exertion and ongoing significant fatigue.  These are unchanged to slightly worse over time.  But in the last 3 months he is developed new onset of chest pains that are present with inspiration but also with exertion.  Presents daily on and off it does not wake him up in the middle of the night.  Moderate in intensity.  Review of the chart indicates that in the interim 1 of the urine tox screens during admission was positive for cocaine.  He also had organizing pneumonia that responded to prednisone.  His most recent chest x-ray January 2019 that I personally visualized and is clear.  Currently he says that he continues to smoke cigarettes  but denies any cocaine use.  He is his cardiologist cleared him of any cardiac reasons for chest pains therefore he is in pulmonary clinic today.   OV 01/13/2018  Chief Complaint  Patient presents with  . Follow-up    PFT  performed 7/22.  Pt states he has been doing okay. States he still is becoming SOB that is mainly with activities and has pain on the right side of chest. Denies any cough.   Follow-up unexplained dyspnea  Here to review test results. Chest x-ray, pulmonary function test and urine toxicology and blood work are normal. CT angiogram chest in January 2019 was normal. He continues to be symptomatic. He reports spine issues. He had a CT scan of the spine in this year 2019 which shows spinal stenosis and other spinal issues. He does have some nonspecific right infra-axillary pain along with shortness of breath. He is frustrated by symptoms. He does not want referral to a [facility] for a second opinion. He is okay repeatingpulmonary stress test for serial testing  n terms of dyspnea it is a level 0 well at rest or eating brushing teeth or shaving but it is a level II while taking a shower and a level one while dressing on making the bed or doing dishes. Level 4  walking on a level at his own pace. Level 2o while shopping of speaking.. Level #3 for watering the lawn mowing the lawn. Level l#4 for walking up stairs. Level #5 for walking up a hill or sexual activities. Associated with chest tightness and hurting all the time. There is no associated cough    has a past medical history of Abnormal chest CT (03/2012), Anxiety, Arthritis, Chronic midline posterior neck pain, Complication of anesthesia, Depression, Esophageal stricture, Fibromyalgia, Fracture, GERD (gastroesophageal reflux disease), Migraine headache, NSTEMI (non-ST elevated myocardial infarction) ([place]) (07/2012), Testosterone deficiency, and Tobacco abuse.   reports that he quit smoking about 4 weeks ago. His smoking use included cigarettes. He has a 28.00 pack-year smoking history. He has quit using smokeless tobacco. His smokeless tobacco use included chew.   OV 10/25/2019  Subjective:  Patient ID: Richard Franco, male , DOB:  [birth year] , age 32 y.o. , MRN: [id number] , ADDRESS: [address] [place] [address]   10/25/2019 -   Chief Complaint  Patient presents with  . Follow-up    Pt states he has been doing okay since last visit. States he is still having a lot of problems with CP and states he ended up in the ED 2 days ago due to the pain. Pt stated he was going to f/u with cardiology. Pt states he does have some mild SOB. Denies any complaints of cough.   Follow-up unexplained Dyspnea and chest pain.  HPI Richard Franco 54 y.o. -returns for follow-up.  Last seen in July 2019.  He says that since then overall he is doing okay but still having unexpected and unexplained intermittent chest pain.  The chest pain is now the dominant feature of her dyspnea.  The dyspnea is sometimes associated with chest pain but mostly independent.  The chest pain happens in the sternal area bilateral infra axillary area.  He thinks is resultant of angina spasms.  He says he had a heart attack in 2014.  Last evaluation that I see is a pulmonary stress test in 2014 and cardiac echocardiogram in 2018.  He says he has had a cardiac cath but I cannot easily visualize it in  the review of the records.  This was also many years ago.  He has upcoming appointment cardiology.  He ended up in the ER a few days ago.  EKG is normal on my personal visualization chest x-ray is clear upon personal visualization.  Lab work is okay but no D-dimer checked.  He is known to have cervical spine disease.  He believes his entire spine has been imaged but review of the records indicate only the C-spine has been imaged.  He says Dr. Sherwood Gambler is his neurosurgeon.  He does not have a primary care physician.  His last pulmonary function test was in 2019.  He had autoimmune and serology work-up but that is being more than 5 years.     ROS - per HPI     has a past medical history of Abnormal chest CT (03/2012), Anxiety, Arthritis, CAD (coronary artery disease),  Chronic midline posterior neck pain, Chronic pain disorder, Complication of anesthesia, Depression, Diabetes ([place]), Diabetic retinopathy ([place]), Esophageal stricture, Fibromyalgia, Fracture, GERD (gastroesophageal reflux disease), Hypercholesteremia, Hypertension, Migraine headache, NSTEMI (non-ST elevated myocardial infarction) ([place]) (07/2012), Stroke (cerebrum) ([place]), Testosterone deficiency, and Tobacco abuse.   reports that he quit smoking about 22 months ago. His smoking use included cigarettes. He has a 28.00 pack-year smoking history. He has quit using smokeless tobacco.  His smokeless tobacco use included chew.  Past Surgical History:  Procedure Laterality Date  . ANKLE FUSION Left 2006  . BACK SURGERY    . BREAST LUMPECTOMY    . CERVICAL FUSION  X7 "last one 04/2011"  . COLONOSCOPY    . CYSTECTOMY    . FRACTURE SURGERY    . LEFT HEART CATHETERIZATION WITH CORONARY ANGIOGRAM N/A 03/25/2012   Procedure: LEFT HEART CATHETERIZATION WITH CORONARY ANGIOGRAM;  Surgeon: Burnell Blanks, MD;  Location: [facility] CATH LAB;  Service: Cardiovascular;  Laterality: N/A;  . LEFT HEART CATHETERIZATION WITH CORONARY ANGIOGRAM N/A 08/11/2012   Procedure: LEFT HEART CATHETERIZATION WITH CORONARY ANGIOGRAM;  Surgeon: Peter M Martinique, MD;  Location: [facility] CATH LAB;  Service: Cardiovascular;  Laterality: N/A;  . POSTERIOR CERVICAL FUSION/FORAMINOTOMY  05/09/2011   Procedure: POSTERIOR CERVICAL FUSION/FORAMINOTOMY LEVEL 1;  Surgeon: Hosie Spangle;  Location: [place] [facility]URO ORS;  Service: Neurosurgery;  Laterality: N/A;  C4/5 posterior arthrodesis with instrumentation   . UPPER GASTROINTESTINAL ENDOSCOPY    . VIDEO BRONCHOSCOPY  05/06/2012   Procedure: VIDEO BRONCHOSCOPY WITH FLUORO;  Surgeon: Jyl Heinz, MD;  Location: [facility] ENDOSCOPY;  Service: Cardiopulmonary;  Laterality: Bilateral;    Allergies  Allergen Reactions  . Midodrine Hcl Swelling    Tongue swelling  . Sulfa Antibiotics Rash    Immunization  History  Administered Date(s) Administered  . Influenza Split 04/16/2012  . Influenza,inj,Quad PF,6+ Mos 03/01/2013, 05/08/2015, 05/30/2017  . Influenza,inj,quad, With Preservative 05/02/2016, 06/02/2018  . Influenza-Unspecified 04/22/2016  . Pneumococcal Polysaccharide-23 04/16/2012  . Tdap 06/02/2018    Family History  Problem Relation Age of Onset  . Diabetes Mother   . Cancer Mother        ?  Marland Kitchen Hypertension Mother   . Hyperlipidemia Father   . Diabetes Father   . Coronary artery disease Paternal Uncle   . Asthma Sister   . Diabetes Brother   . Hypertension Brother   . Colon cancer Neg Hx   . Colon polyps Neg Hx   . Rectal cancer Neg Hx   . Stomach cancer Neg Hx      Current Outpatient Medications:  .  amLODipine (  NORVASC) 5 MG tablet, Take 5 mg by mouth daily. , Disp: , Rfl:  .  aspirin EC 81 MG EC tablet, Take 1 tablet (81 mg total) by mouth daily., Disp: , Rfl:  .  esomeprazole (NEXIUM) 40 MG capsule, TAKE ONE CAPSULE BY MOUTH TWICE A DAY BEFORE A MEAL., Disp: 180 capsule, Rfl: 3 .  FLUoxetine (PROZAC) 20 MG capsule, Take 20 mg by mouth 2 (two) times daily. , Disp: , Rfl:  .  HYDROcodone-acetaminophen (NORCO) 10-325 MG tablet, Take 0.5 tablets by mouth every 6 (six) hours as needed., Disp: , Rfl:  .  hydrOXYzine (ATARAX/VISTARIL) 25 MG tablet, Take 1 tablet (25 mg total) by mouth every 6 (six) hours., Disp: 12 tablet, Rfl: 0 .  lamoTRIgine (LAMICTAL) 100 MG tablet, Take 150 mg by mouth 2 (two) times daily. , Disp: , Rfl:  .  nitroGLYCERIN (NITROSTAT) 0.4 MG SL tablet, DISSOLVE ONE TABLET UNDER THE TONGUE EVERY 5 MINUTES AS NEEDED FOR CHEST PAIN. DO NOT EXCEED A TOTAL OF 3 DOSES IN 15 MINUTES, Disp: 25 tablet, Rfl: 7      Objective:   Vitals:   10/25/19 0850  BP: 116/82  Pulse: 79  Temp: (!) 97.1 F (36.2 C)  TempSrc: Temporal  SpO2: 96%  Weight: 177 lb 9.6 oz (80.6 kg)  Height: _0  (1.727 m)    Estimated body mass index is 27 kg/m as calculated from the  following:   Height as of this encounter: _1  (1.727 m).   Weight as of this encounter: 177 lb 9.6 oz (80.6 kg).  _2 @  [employer]   10/25/19 0850  Weight: 177 lb 9.6 oz (80.6 kg)     Physical Exam  He has skin tattoo on his back.  He has a cervical spine surgery scar.  Somewhat of a flat affect but oral cavity is normal.  Lungs are clear to auscultation.  No obvious tenderness or swelling.  Normal heart sounds abdomen soft no cyanosis no clubbing no edema.  Overall nonfocal exam.    Assessment:       ICD-10-CM   1. Chest pain, unspecified type  R07.9 D-Dimer, Quantitative    ANA    Anti-DNA antibody, double-stranded    Rheumatoid Factor    Cyclic citrul peptide antibody, IgG    Sed Rate (ESR)    DRUG TEST, GENERAL TOXICOLOGY, URINE    Pulmonary function test  2. Dyspnea on exertion  R06.00 Pulmonary function test       Plan:     Patient Instructions     ICD-10-CM   1. Chest pain, unspecified type  R07.9   2. Dyspnea on exertion  R06.00     Check ANA, DS-DNA, RF, CCP, ESR 10/25/2019  Check D-dimer blood work 10/25/2019 - if abnormal, will do CT angio chest Do URine toxicology test 10/25/2019 Do full PFT next few to several weeks Get a  PCP Follow with cardiology as well - maybe you need another stress test (last 2014) and echo (last 2018) Call Dr Sherwood Gambler and see if you wil lbenefit from a thoracic spine mRI to explain your chest pains   [place]  - in 4-6 weeks video visit with Dr Gaye Alken    Dr. Brand Males, M.D., F.C.C.P,  Pulmonary and Critical Care Medicine Staff Physician, [place] Director - Interstitial Lung Disease  Program  P[facility] [place] [facility] [place], [place], [address]  Pager:  [phone], If no answer or between  15:00h - 7:00h: call [phone] Telephone: [phone]  9:29 AM 10/25/2019

## 2019-10-25 NOTE — Patient Instructions (Addendum)
ICD-10-CM   1. Chest pain, unspecified type  R07.9   2. Dyspnea on exertion  R06.00     Check ANA, DS-DNA, RF, CCP, ESR 10/25/2019  Check D-dimer blood work 10/25/2019 - if abnormal, will do CT angio chest Do URine toxicology test 10/25/2019 Do full PFT next few to several weeks Get a  PCP Follow with cardiology as well - maybe you need another stress test (last 2014) and echo (last 2018) Call Dr Sherwood Gambler and see if you wil lbenefit from a thoracic spine mRI to explain your chest pains   [place]  - in 4-6 weeks video visit with Dr Chase Caller

## 2019-10-25 NOTE — Progress Notes (Signed)
D-dimer tody normal

## 2019-10-26 LAB — RHEUMATOID FACTOR: Rheumatoid fact SerPl-aCnc: <14 IU/mL (ref <14)

## 2019-10-26 LAB — ANA: Anti Nuclear Antibody (ANA): NEGATIVE

## 2019-10-26 LAB — ANTI-DNA ANTIBODY, DOUBLE-STRANDED: ds DNA Ab: <1 IU/mL

## 2019-10-26 LAB — CYCLIC CITRUL PEPTIDE ANTIBODY, IGG: Cyclic Citrullin Peptide Ab: <16 UNITS

## 2019-10-26 LAB — D-DIMER, QUANTITATIVE: D-Dimer, Quant: 0.28 mcg/mL FEU (ref <0.50)

## 2019-11-01 NOTE — Progress Notes (Signed)
Autoimmune also normal

## 2019-11-04 DIAGNOSIS — Z03818 Encounter for observation for suspected exposure to other biological agents ruled out: Secondary | ICD-10-CM | POA: Diagnosis not present

## 2019-11-04 DIAGNOSIS — Z20828 Contact with and (suspected) exposure to other viral communicable diseases: Secondary | ICD-10-CM | POA: Diagnosis not present

## 2019-11-05 DIAGNOSIS — J189 Pneumonia, unspecified organism: Secondary | ICD-10-CM | POA: Diagnosis not present

## 2019-11-05 DIAGNOSIS — Z20822 Contact with and (suspected) exposure to covid-19: Secondary | ICD-10-CM | POA: Diagnosis not present

## 2019-11-05 DIAGNOSIS — R110 Nausea: Secondary | ICD-10-CM | POA: Diagnosis not present

## 2019-11-05 NOTE — Progress Notes (Signed)
Patient identification verified. Normal autoimmune labs reviewed. Patient verbalized understanding of results.

## 2019-11-15 DIAGNOSIS — R0989 Other specified symptoms and signs involving the circulatory and respiratory systems: Secondary | ICD-10-CM | POA: Diagnosis not present

## 2019-11-15 DIAGNOSIS — R079 Chest pain, unspecified: Secondary | ICD-10-CM | POA: Diagnosis not present

## 2019-11-15 DIAGNOSIS — J0100 Acute maxillary sinusitis, unspecified: Secondary | ICD-10-CM | POA: Diagnosis not present

## 2019-11-15 DIAGNOSIS — R0981 Nasal congestion: Secondary | ICD-10-CM | POA: Diagnosis not present

## 2019-11-26 ENCOUNTER — Other Ambulatory Visit (HOSPITAL_COMMUNITY): Payer: Medicare Other

## 2019-11-29 DIAGNOSIS — M961 Postlaminectomy syndrome, not elsewhere classified: Secondary | ICD-10-CM | POA: Diagnosis not present

## 2019-12-01 ENCOUNTER — Telehealth: Payer: Medicare Other | Admitting: Internal Medicine

## 2020-01-12 DIAGNOSIS — F330 Major depressive disorder, recurrent, mild: Secondary | ICD-10-CM | POA: Diagnosis not present

## 2020-01-12 DIAGNOSIS — F411 Generalized anxiety disorder: Secondary | ICD-10-CM | POA: Diagnosis not present

## 2020-02-08 DIAGNOSIS — Z23 Encounter for immunization: Secondary | ICD-10-CM | POA: Diagnosis not present

## 2020-02-21 NOTE — Progress Notes (Deleted)
CARDIOLOGY OFFICE NOTE  Date:  02/21/2020    [name] Date of Birth: [birth year] Medical Record #[id number]  PCP:  Patient, No Pcp Per  Cardiologist:  Harrell Gave   No chief complaint on file.   History of Present Illness: Richard Franco is a 54 y.o. male who presents today for a follow up visit. Seen for Dr. Harrell Gave. Former patient of Dr. Claris Gladden.   He has a history of chronic chest pain syndrome, no known  CAD, question vasospasm vs. other etiology, HTN, HLD, fibromyalgia, and pulmonary ground glass opacities on prior CT.   He was last seen by Dr. Harrell Gave last summer by telehealth visit - lots of concerns noted. Severe and chronic fatigue. Pleuritic chest pain.   Comes in today. Here with   Past Medical History:  Diagnosis Date  . Abnormal chest CT 03/2012   a. 2013: Scattered patchy ground glass opacities and pulmonary nodules. Transbronchial biopsy with no definite etiology of lung finding- possible organizing pneumonia. b. F/u CT 05/2014: stable multiple tiny pulm nodules, no further w/u recommended per notes.   . Anxiety   . Arthritis   . CAD (coronary artery disease)   . Chronic midline posterior neck pain   . Chronic pain disorder   . Complication of anesthesia    " difficult to urinate after "  . Depression   . Diabetes ([place])   . Diabetic retinopathy ([place])   . Esophageal stricture   . Fibromyalgia   . Fracture    back  . GERD (gastroesophageal reflux disease)   . Hypercholesteremia   . Hypertension   . Migraine headache    "none lately; I've had alot in my life" (06/21/2015)  . NSTEMI (non-ST elevated myocardial infarction) ([place]) 07/2012   a. Normal cath 03/2012. b. 07/2012: troponin 4, normal cors, ?vasospasm. Did not tolerate Imdur due to headache. On amlodipine.  . Stroke (cerebrum) ([place])   . Testosterone deficiency   . Tobacco abuse     Past Surgical History:  Procedure Laterality Date  . ANKLE FUSION Left 2006  . BACK SURGERY     . BREAST LUMPECTOMY    . CERVICAL FUSION  X7 "last one 04/2011"  . COLONOSCOPY    . CYSTECTOMY    . FRACTURE SURGERY    . LEFT HEART CATHETERIZATION WITH CORONARY ANGIOGRAM N/A 03/25/2012   Procedure: LEFT HEART CATHETERIZATION WITH CORONARY ANGIOGRAM;  Surgeon: Burnell Blanks, MD;  Location: [facility] CATH LAB;  Service: Cardiovascular;  Laterality: N/A;  . LEFT HEART CATHETERIZATION WITH CORONARY ANGIOGRAM N/A 08/11/2012   Procedure: LEFT HEART CATHETERIZATION WITH CORONARY ANGIOGRAM;  Surgeon: Peter M Martinique, MD;  Location: [facility] CATH LAB;  Service: Cardiovascular;  Laterality: N/A;  . POSTERIOR CERVICAL FUSION/FORAMINOTOMY  05/09/2011   Procedure: POSTERIOR CERVICAL FUSION/FORAMINOTOMY LEVEL 1;  Surgeon: Hosie Spangle;  Location: [place] [facility] NEURO ORS;  Service: Neurosurgery;  Laterality: N/A;  C4/5 posterior arthrodesis with instrumentation   . UPPER GASTROINTESTINAL ENDOSCOPY    . VIDEO BRONCHOSCOPY  05/06/2012   Procedure: VIDEO BRONCHOSCOPY WITH FLUORO;  Surgeon: Jyl Heinz, MD;  Location: [facility] ENDOSCOPY;  Service: Cardiopulmonary;  Laterality: Bilateral;     Medications: No outpatient medications have been marked as taking for the 03/06/20 encounter (Appointment) with Burtis Junes, NP.     Allergies: Allergies  Allergen Reactions  . Midodrine Hcl Swelling    Tongue swelling  . Sulfa Antibiotics Rash    Social History: The patient  reports that he  quit smoking about 2 years ago. His smoking use included cigarettes. He has a 28.00 pack-year smoking history. He has quit using smokeless tobacco.  His smokeless tobacco use included chew. He reports previous alcohol use of about 21.0 standard drinks of alcohol per week. He reports that he does not use drugs.   Family History: The patient's ***family history includes Asthma in his sister; Cancer in his mother; Coronary artery disease in his paternal uncle; Diabetes in his brother, father, and mother; Hyperlipidemia in his father;  Hypertension in his brother and mother.   Review of Systems: Please see the history of present illness.   All other systems are reviewed and negative.   Physical Exam: VS:  There were no vitals taken for this visit. Marland Kitchen  BMI There is no height or weight on file to calculate BMI.  Wt Readings from Last 3 Encounters:  10/25/19 177 lb 9.6 oz (80.6 kg)  10/23/19 180 lb 12.8 oz (82 kg)  01/18/19 184 lb (83.5 kg)    General: Pleasant. Well developed, well nourished and in no acute distress.   HEENT: Normal.  Neck: Supple, no JVD, carotid bruits, or masses noted.  Cardiac: ***Regular rate and rhythm. No murmurs, rubs, or gallops. No edema.  Respiratory:  Lungs are clear to auscultation bilaterally with normal work of breathing.  GI: Soft and nontender.  MS: No deformity or atrophy. Gait and ROM intact.  Skin: Warm and dry. Color is normal.  Neuro:  Strength and sensation are intact and no gross focal deficits noted.  Psych: Alert, appropriate and with normal affect.   LABORATORY DATA:  EKG:  EKG {ACTION; IS/IS [id number]} ordered today.  Personally reviewed by me. This demonstrates ***.  Lab Results  Component Value Date   WBC 4.4 10/23/2019   HGB 12.6 (L) 10/23/2019   HCT 37.0 (L) 10/23/2019   PLT 171 10/23/2019   GLUCOSE 82 10/23/2019   CHOL 203 (H) 07/05/2016   TRIG 134 07/05/2016   HDL 54 07/05/2016   LDLCALC 122 (H) 07/05/2016   ALT 21 01/16/2018   AST 20 01/16/2018   NA 136 10/23/2019   K 4.3 10/23/2019   CL 105 10/23/2019   CREATININE 1.20 10/23/2019   BUN 10 10/23/2019   CO2 25 06/23/2019   TSH 1.85 12/03/2017   INR 0.99 08/10/2012   HGBA1C 5.2 07/05/2016       BNP (last 3 results) No results for input(s): BNP in the last 8760 hours.  ProBNP (last 3 results) No results for input(s): PROBNP in the last 8760 hours.   Other Studies Reviewed Today:  Echo Study Conclusions 06/2016  - Left ventricle: Inferobasal hypokinesis. The cavity size was    normal. Systolic function was normal. The estimated ejection  fraction was in the range of 55% to 60%. Wall motion was normal;  there were no regional wall motion abnormalities. Left  ventricular diastolic function parameters were normal.  - Atrial septum: A patent foramen ovale cannot be excluded.    ASSESSMENT & PLAN:    Chronic chest pain syndrome, chronic fatigue: stable, atypical. He has not seen a specialist for his fibromyalgia, and I wonder if that is contributing to his symptoms. -he will talk to his PCP about recommendations for fibromyalgia specialists -counseled on red flag warning signs that need immediate medical attention -has PRN SL NG -takes daily aspirin for prevention  Hypertension: reports good control on home cuff -continue amlodipine  Hypercholesterolemia: father can't take statins, and with his chronic  myalgia he does not wish to trial -last LDL 122 -on aspirin for primary prevention instead -encouraged diet and exercise changes    Current medicines are reviewed with the patient today.  The patient does not have concerns regarding medicines other than what has been noted above.  The following changes have been made:  See above.  Labs/ tests ordered today include:   No orders of the defined types were placed in this encounter.    Disposition:   FU with *** in {gen number [id number]} {Days to years:10300}.   Patient is agreeable to this plan and will call if any problems develop in the interim.   SignedTruitt Merle, NP  02/21/2020 9:53 AM  [facility] [address] [place], [place]  [address] Phone: [phone] Fax: [phone]

## 2020-03-02 DIAGNOSIS — M542 Cervicalgia: Secondary | ICD-10-CM | POA: Diagnosis not present

## 2020-03-02 DIAGNOSIS — M47812 Spondylosis without myelopathy or radiculopathy, cervical region: Secondary | ICD-10-CM | POA: Diagnosis not present

## 2020-03-02 DIAGNOSIS — M961 Postlaminectomy syndrome, not elsewhere classified: Secondary | ICD-10-CM | POA: Diagnosis not present

## 2020-03-06 ENCOUNTER — Ambulatory Visit: Payer: Medicare Other | Admitting: Nurse Practitioner

## 2020-03-13 DIAGNOSIS — M47812 Spondylosis without myelopathy or radiculopathy, cervical region: Secondary | ICD-10-CM | POA: Diagnosis not present

## 2020-03-14 DIAGNOSIS — Z23 Encounter for immunization: Secondary | ICD-10-CM | POA: Diagnosis not present

## 2020-04-07 ENCOUNTER — Other Ambulatory Visit: Payer: Self-pay | Admitting: Gastroenterology

## 2020-05-21 ENCOUNTER — Other Ambulatory Visit: Payer: Self-pay | Admitting: Gastroenterology

## 2020-06-01 DIAGNOSIS — M961 Postlaminectomy syndrome, not elsewhere classified: Secondary | ICD-10-CM | POA: Diagnosis not present

## 2020-06-01 DIAGNOSIS — M47812 Spondylosis without myelopathy or radiculopathy, cervical region: Secondary | ICD-10-CM | POA: Diagnosis not present

## 2020-06-01 DIAGNOSIS — G4486 Cervicogenic headache: Secondary | ICD-10-CM | POA: Diagnosis not present

## 2020-06-03 ENCOUNTER — Other Ambulatory Visit: Payer: Self-pay

## 2020-06-03 ENCOUNTER — Emergency Department (HOSPITAL_COMMUNITY): Payer: Medicare Other

## 2020-06-03 ENCOUNTER — Emergency Department (HOSPITAL_COMMUNITY)
Admission: EM | Admit: 2020-06-03 | Discharge: 2020-06-03 | Disposition: A | Discharge status: Home / Self Care | Payer: Medicare Other | Attending: Emergency Medicine | Admitting: Emergency Medicine

## 2020-06-03 DIAGNOSIS — E11319 Type 2 diabetes mellitus with unspecified diabetic retinopathy without macular edema: Secondary | ICD-10-CM | POA: Diagnosis not present

## 2020-06-03 DIAGNOSIS — Z7982 Long term (current) use of aspirin: Secondary | ICD-10-CM | POA: Insufficient documentation

## 2020-06-03 DIAGNOSIS — K2100 Gastro-esophageal reflux disease with esophagitis, without bleeding: Secondary | ICD-10-CM

## 2020-06-03 DIAGNOSIS — Z8673 Personal history of transient ischemic attack (TIA), and cerebral infarction without residual deficits: Secondary | ICD-10-CM | POA: Diagnosis not present

## 2020-06-03 DIAGNOSIS — Z87891 Personal history of nicotine dependence: Secondary | ICD-10-CM | POA: Insufficient documentation

## 2020-06-03 DIAGNOSIS — I10 Essential (primary) hypertension: Secondary | ICD-10-CM | POA: Insufficient documentation

## 2020-06-03 DIAGNOSIS — Z79899 Other long term (current) drug therapy: Secondary | ICD-10-CM | POA: Insufficient documentation

## 2020-06-03 DIAGNOSIS — I2510 Atherosclerotic heart disease of native coronary artery without angina pectoris: Secondary | ICD-10-CM | POA: Diagnosis not present

## 2020-06-03 DIAGNOSIS — R079 Chest pain, unspecified: Secondary | ICD-10-CM | POA: Insufficient documentation

## 2020-06-03 DIAGNOSIS — K219 Gastro-esophageal reflux disease without esophagitis: Secondary | ICD-10-CM | POA: Diagnosis not present

## 2020-06-03 LAB — CBC
HCT: 40 % (ref 39.0–52.0)
Hemoglobin: 13.2 g/dL (ref 13.0–17.0)
MCH: 29.7 pg (ref 26.0–34.0)
MCHC: 33 g/dL (ref 30.0–36.0)
MCV: 90.1 fL (ref 80.0–100.0)
Platelets: 213 10*3/uL (ref 150–400)
RBC: 4.44 MIL/uL (ref 4.22–5.81)
RDW: 13.2 % (ref 11.5–15.5)
WBC: 5.2 10*3/uL (ref 4.0–10.5)
nRBC: 0 % (ref 0.0–0.2)

## 2020-06-03 LAB — BASIC METABOLIC PANEL
Anion gap: 10 (ref 5–15)
BUN: 12 mg/dL (ref 6–20)
CO2: 24 mmol/L (ref 22–32)
Calcium: 9.4 mg/dL (ref 8.9–10.3)
Chloride: 104 mmol/L (ref 98–111)
Creatinine, Ser: 1.33 mg/dL — ABNORMAL HIGH (ref 0.61–1.24)
GFR, Estimated: >60 mL/min (ref >60)
Glucose, Bld: 95 mg/dL (ref 70–99)
Potassium: 4.4 mmol/L (ref 3.5–5.1)
Sodium: 138 mmol/L (ref 135–145)

## 2020-06-03 LAB — TROPONIN I (HIGH SENSITIVITY)
Troponin I (High Sensitivity): <2 ng/L (ref <18)
Troponin I (High Sensitivity): <2 ng/L (ref <18)

## 2020-06-03 MED ORDER — ALBUTEROL SULFATE HFA 108 (90 BASE) MCG/ACT IN AERS: 2.0000 | INHALATION_SPRAY | RESPIRATORY_TRACT | Status: DC | PRN

## 2020-06-03 MED ORDER — ALUM & MAG HYDROXIDE-SIMETH 200-200-20 MG/5ML PO SUSP
30.0000 mL | Freq: Once | ORAL | Status: AC
  Administered 2020-06-03: 17:00:00 30 mL via ORAL
  Filled 2020-06-03: qty 30

## 2020-06-03 MED ORDER — HYDROMORPHONE HCL 1 MG/ML IJ SOLN
2.0000 mg | Freq: Once | INTRAMUSCULAR | Status: AC
Start: 2020-06-03 — End: 2020-06-03
  Administered 2020-06-03: 18:00:00 2 mg via INTRAMUSCULAR
  Filled 2020-06-03: qty 2

## 2020-06-03 MED ORDER — SUCRALFATE 1 G PO TABS: 1.0000 g | ORAL_TABLET | Freq: Three times a day (TID) | ORAL | 1 refills | Status: DC

## 2020-06-03 MED ORDER — ACETAMINOPHEN 500 MG PO TABS
1000.0000 mg | ORAL_TABLET | Freq: Once | ORAL | Status: AC
  Administered 2020-06-03: 1000 mg via ORAL
  Filled 2020-06-03: qty 2

## 2020-06-03 MED ORDER — IBUPROFEN 400 MG PO TABS
400.0000 mg | ORAL_TABLET | Freq: Once | ORAL | Status: AC
  Administered 2020-06-03: 400 mg via ORAL
  Filled 2020-06-03: qty 1

## 2020-06-03 MED ORDER — LIDOCAINE VISCOUS HCL 2 % MT SOLN
15.0000 mL | Freq: Once | OROMUCOSAL | Status: AC
  Administered 2020-06-03: 17:00:00 15 mL via ORAL
  Filled 2020-06-03: qty 15

## 2020-06-03 NOTE — ED Triage Notes (Signed)
Pt here for eval of L sided chest pain with radiation to L arm since last night, woke him out of sleep. Has has associated shob. Took three nitro, with immediate improvement but pain returns after a short time. Hx MI in 2014.

## 2020-06-03 NOTE — ED Notes (Signed)
C/o waiting on pain meds no secretary one tech two other nurses tied up with critical patients and discharges are being done

## 2020-06-03 NOTE — ED Provider Notes (Signed)
[facility] EMERGENCY DEPARTMENT Provider Note   CSN: [id number] Arrival date & time: 06/03/20  1246     History Chief Complaint  Patient presents with  . Chest Pain    Richard Franco is a 54 y.o. male.  Patient is a 54 year old male with a history of chest pain, diabetes, hypertension, hyperlipidemia, prior NSTEMI with normal catheterization in 2013 and prior alcohol and cocaine use and tobacco use clean for years presenting today with chest pain.  Patient reports yesterday was a normal day.  Last night when he went to bed he felt fine and then woke up around 1 AM with chest tightness and pain radiating to his left arm.  He describes the pain as a sharp sensation but also a heavy sensation that waxes and wanes in severity.  It also intermittently radiates into his shoulder but does not really radiate down his arm into his fingers.  He has had associated shortness of breath with it.  Exertion and walking does not seem to make it worse but if he lays down he reports the pressure seems more intense on his chest.  He has had no abdominal pain nausea or vomiting.  He denies new cough, sputum production, fever.  He does have an inhaler at home but has not used it.  He did try taking nitroglycerin when symptoms started last night.  He took 1 waited 5 minutes took another 1 which did help the pain for a short time but it returned within 10 minutes.  He denies any trauma, recent travel, unilateral leg pain or swelling or prior history of PE/DVT.  Patient does report this feels similar to prior chest pain episodes but with it going into his arm and being more intense it concerned him.  He reports when he had a heart attack in 2014 he had pain in both arms and his neck but was found to have coronary vasospasm and did not require any stenting.  He does take an aspirin daily.  The history is provided by the patient and medical records.  Chest Pain      Past Medical History:   Diagnosis Date  . Abnormal chest CT 03/2012   a. 2013: Scattered patchy ground glass opacities and pulmonary nodules. Transbronchial biopsy with no definite etiology of lung finding- possible organizing pneumonia. b. F/u CT 05/2014: stable multiple tiny pulm nodules, no further w/u recommended per notes.   . Anxiety   . Arthritis   . CAD (coronary artery disease)   . Chronic midline posterior neck pain   . Chronic pain disorder   . Complication of anesthesia    " difficult to urinate after "  . Depression   . Diabetes ([place])   . Diabetic retinopathy ([place])   . Esophageal stricture   . Fibromyalgia   . Fracture    back  . GERD (gastroesophageal reflux disease)   . Hypercholesteremia   . Hypertension   . Migraine headache    "none lately; I've had alot in my life" (06/21/2015)  . NSTEMI (non-ST elevated myocardial infarction) ([place]) 07/2012   a. Normal cath 03/2012. b. 07/2012: troponin 4, normal cors, ?vasospasm. Did not tolerate Imdur due to headache. On amlodipine.  . Stroke (cerebrum) ([place])   . Testosterone deficiency   . Tobacco abuse     Patient Active Problem List   Diagnosis Date Noted  . Throat swelling 07/06/2016  . Cocaine use 07/06/2016  . ETOH abuse 07/06/2016  . Dyspnea on  exertion 07/04/2016  . Chest pain 07/04/2016  . Essential hypertension 07/04/2016  . AKI (acute kidney injury) ([place]) 07/04/2016  . Chest pain, atypical 11/16/2015  . Pleuritic chest pain 06/22/2015  . Tobacco abuse 06/22/2015  . History of coronary vasospasm 06/22/2015  . Smoking history 07/19/2014  . Chronic fatigue 07/19/2014  . Chronic arthralgias of knees and hips 11/26/2012  . Hyperlipidemia 09/25/2012  . GERD (gastroesophageal reflux disease) 08/11/2012  . BPH (benign prostatic hyperplasia) 08/11/2012  . Abnormal immunological finding in serum 05/03/2012  . Pulmonary infiltrates 04/16/2012  . Pulmonary nodule 03/25/2012  . Abnormal CT lung screening 03/25/2012  . History of tobacco  abuse 02/18/2012  . Fibromyalgia 02/18/2012  . Hx of migraine headaches 02/18/2012  . DYSPNEA 08/24/2009  . CHEST PAIN-UNSPECIFIED 08/24/2009  . ABFND, FALSE POSITIVE SEROLOGIC TEST, [facility] 01/12/2007  . OSTEOARTHRITIS 01/05/2007    Past Surgical History:  Procedure Laterality Date  . ANKLE FUSION Left 2006  . BACK SURGERY    . BREAST LUMPECTOMY    . CERVICAL FUSION  X7 "last one 04/2011"  . COLONOSCOPY    . CYSTECTOMY    . FRACTURE SURGERY    . LEFT HEART CATHETERIZATION WITH CORONARY ANGIOGRAM N/A 03/25/2012   Procedure: LEFT HEART CATHETERIZATION WITH CORONARY ANGIOGRAM;  Surgeon: Burnell Blanks, MD;  Location: [facility] CATH LAB;  Service: Cardiovascular;  Laterality: N/A;  . LEFT HEART CATHETERIZATION WITH CORONARY ANGIOGRAM N/A 08/11/2012   Procedure: LEFT HEART CATHETERIZATION WITH CORONARY ANGIOGRAM;  Surgeon: Peter M Martinique, MD;  Location: [facility] CATH LAB;  Service: Cardiovascular;  Laterality: N/A;  . POSTERIOR CERVICAL FUSION/FORAMINOTOMY  05/09/2011   Procedure: POSTERIOR CERVICAL FUSION/FORAMINOTOMY LEVEL 1;  Surgeon: Hosie Spangle;  Location: [facility] NEURO ORS;  Service: Neurosurgery;  Laterality: N/A;  C4/5 posterior arthrodesis with instrumentation   . UPPER GASTROINTESTINAL ENDOSCOPY    . VIDEO BRONCHOSCOPY  05/06/2012   Procedure: VIDEO BRONCHOSCOPY WITH FLUORO;  Surgeon: Jyl Heinz, MD;  Location: [facility] ENDOSCOPY;  Service: Cardiopulmonary;  Laterality: Bilateral;       Family History  Problem Relation Age of Onset  . Diabetes Mother   . Cancer Mother        ?  Marland Kitchen Hypertension Mother   . Hyperlipidemia Father   . Diabetes Father   . Coronary artery disease Paternal Uncle   . Asthma Sister   . Diabetes Brother   . Hypertension Brother   . Colon cancer Neg Hx   . Colon polyps Neg Hx   . Rectal cancer Neg Hx   . Stomach cancer Neg Hx     Social History   Tobacco Use  . Smoking status: Former Smoker    Packs/day: 1.00    Years: 28.00    Pack years: 28.00     Types: Cigarettes    Quit date: 12/16/2017    Years since quitting: 2.4  . Smokeless tobacco: Former Systems developer    Types: Chew  Substance Use Topics  . Alcohol use: Not Currently    Alcohol/week: 21.0 standard drinks    Types: 21 Cans of beer per week    Comment: 06/21/2015 "3 beers/day lately; more lately cause of being separated"   . Drug use: No    Home Medications Prior to Admission medications   Medication Sig Start Date End Date Taking? Authorizing Provider  amLODipine (NORVASC) 5 MG tablet Take 5 mg by mouth daily.     [provider]  aspirin EC 81 MG EC tablet Take 1 tablet (  81 mg total) by mouth daily. 08/12/12   Dunn, Nedra Hai, PA-C  esomeprazole (NEXIUM) 40 MG capsule TAKE ONE CAPSULE BY MOUTH TWICE A DAY BEFORE A MEAL. 04/15/19   Mauri Pole, MD  FLUoxetine (PROZAC) 20 MG capsule Take 20 mg by mouth 2 (two) times daily.     [provider]  HYDROcodone-acetaminophen (NORCO) 10-325 MG tablet Take 0.5 tablets by mouth every 6 (six) hours as needed. 10/12/19   [provider]  hydrOXYzine (ATARAX/VISTARIL) 25 MG tablet Take 1 tablet (25 mg total) by mouth every 6 (six) hours. 10/23/19   Tacy Learn, PA-C  lamoTRIgine (LAMICTAL) 100 MG tablet Take 150 mg by mouth 2 (two) times daily.     [provider]  nitroGLYCERIN (NITROSTAT) 0.4 MG SL tablet DISSOLVE ONE TABLET UNDER THE TONGUE EVERY 5 MINUTES AS NEEDED FOR CHEST PAIN. DO NOT EXCEED A TOTAL OF 3 DOSES IN 15 MINUTES 09/14/19   Lendon Colonel, NP    Allergies    Midodrine hcl and Sulfa antibiotics  Review of Systems   Review of Systems  Cardiovascular: Positive for chest pain.  All other systems reviewed and are negative.   Physical Exam Updated Vital Signs BP 108/76 (BP Location: Left Arm)   Pulse 70   Temp 98.1 F (36.7 C) (Oral)   Resp 16   SpO2 99%   Physical Exam Vitals and nursing note reviewed.  Constitutional:      General: He is not in acute distress.     Appearance: He is well-developed, normal weight and well-nourished.  HENT:     Head: Normocephalic and atraumatic.     Mouth/Throat:     Mouth: Oropharynx is clear and moist. Mucous membranes are moist.  Eyes:     Extraocular Movements: EOM normal.     Conjunctiva/sclera: Conjunctivae normal.     Pupils: Pupils are equal, round, and reactive to light.  Cardiovascular:     Rate and Rhythm: Normal rate and regular rhythm.     Pulses: Normal pulses and intact distal pulses.     Heart sounds: No murmur heard.   Pulmonary:     Effort: Pulmonary effort is normal. No respiratory distress.     Breath sounds: Normal breath sounds. No wheezing or rales.  Chest:     Chest wall: No tenderness.  Abdominal:     General: There is no distension.     Palpations: Abdomen is soft.     Tenderness: There is no abdominal tenderness. There is no guarding or rebound.  Musculoskeletal:        General: No tenderness or edema. Normal range of motion.     Cervical back: Normal range of motion and neck supple.     Right lower leg: No edema.     Left lower leg: No edema.  Skin:    General: Skin is warm and dry.     Findings: No erythema or rash.  Neurological:     General: No focal deficit present.     Mental Status: He is alert and oriented to person, place, and time. Mental status is at baseline.  Psychiatric:        Mood and Affect: Mood and affect and mood normal.        Behavior: Behavior normal.        Thought Content: Thought content normal.      ED Results / Procedures / Treatments   Labs (all labs ordered are listed, but only abnormal  results are displayed) Labs Reviewed  BASIC METABOLIC PANEL - Abnormal; Notable for the following components:      Result Value   Creatinine, Ser 1.33 (*)    All other components within normal limits  CBC  TROPONIN I (HIGH SENSITIVITY)  TROPONIN I (HIGH SENSITIVITY)    EKG EKG Interpretation  Date/Time:  Saturday June 03 2020 13:05:22  EST Ventricular Rate:  80 PR Interval:  144 QRS Duration: 84 QT Interval:  384 QTC Calculation: 442 R Axis:   75 Text Interpretation: Normal sinus rhythm Possible Lateral infarct , age undetermined No significant change since last tracing Confirmed by Blanchie Dessert [phone]) on 06/03/2020 2:57:52 PM   Radiology DG Chest 2 View  Result Date: 06/03/2020 CLINICAL DATA:  54 year old male with chest pain. EXAM: CHEST - 2 VIEW COMPARISON:  10/23/2019 FINDINGS: The heart size and mediastinal contours are within normal limits. Both lungs are clear. The visualized skeletal structures are unremarkable. IMPRESSION: No acute cardiopulmonary process. Electronically Signed   By: Ruthann Cancer MD   On: 06/03/2020 14:22    Procedures Procedures (including critical care time)  Medications Ordered in ED Medications  albuterol (VENTOLIN HFA) 108 (90 Base) MCG/ACT inhaler 2 puff (has no administration in time range)  acetaminophen (TYLENOL) tablet 1,000 mg (has no administration in time range)  ibuprofen (ADVIL) tablet 400 mg (has no administration in time range)    ED Course  I have reviewed the triage vital signs and the nursing notes.  Pertinent labs & imaging results that were available during my care of the patient were reviewed by me and considered in my medical decision making (see chart for details).    MDM Rules/Calculators/A&P                          54 year old male presenting today with nonspecific chest pain that woke him from sleep at 1 AM this morning.  He denies any infectious symptoms, abdominal symptoms but is complaining of some shortness of breath.  Patient does have a prior tobacco use history reports he does have an inhaler at home that he does not use it regularly.  Also history of drug abuse in the past, coronary vasospasm and catheterization that was normal and did not require any stenting in 2014.  Patient had a cardiac echo done in 2018 which showed normal EF and no  acute findings.  Patient did have pulmonary function testing done in 2019 which was normal and reports has not smoked since that time.  Patient reports that he has had pneumonia in the past that has also felt similar.  He does take medication for GI upset regularly and reports he has continued to take that and does not feel heartburn at this time but does use NSAIDs weekly and did have some bloating last night after eating and has had some worsening GI symptoms to where he is never hungry and when he does eat he has early satiety.  Patient is well-appearing on exam.  EKG is unchanged.  Troponin is less than 2 and that is with pain for greater than 6 hours. CBC, BMP, troponin are all within normal limits.  Chest x-ray without acute findings.  No EKG findings concerning for pericarditis and low suspicion for myocarditis.  Low suspicion for Covid at this time.  No evidence of pneumothorax.  Could be pleurisy versus chest wall pain.  Also could be GI in nature.  Patient given Tylenol, ibuprofen we will  do 1 dose of albuterol to see if that improves his symptoms.  He does report nitroglycerin helps it moderately so we will also try GI cocktail.  Patient is a heart score of 4 but again denies any recent drug tobacco or alcohol use and low suspicion that this is cardiac at this time. 4:58 PM Delta trop is neg.  No significant improvement after above medications.  Will give 1 dose of pain meds.  Pt to f/u with GI and Pulm.   MDM Number of Diagnoses or Management Options   Amount and/or Complexity of Data Reviewed Clinical lab tests: ordered and reviewed Tests in the radiology section of CPT: ordered and reviewed Tests in the medicine section of CPT: ordered and reviewed Decide to obtain previous medical records or to obtain history from someone other than the patient: yes Obtain history from someone other than the patient: no Review and summarize past medical records: yes Independent visualization of  images, tracings, or specimens: yes  Risk of Complications, Morbidity, and/or Mortality Presenting problems: moderate Diagnostic procedures: low Management options: low  Patient Progress Patient progress: stable  Final Clinical Impression(s) / ED Diagnoses Final diagnoses:  Nonspecific chest pain  Gastroesophageal reflux disease with esophagitis without hemorrhage    Rx / DC Orders ED Discharge Orders    None       Blanchie Dessert, MD 06/03/20 1710

## 2020-06-03 NOTE — Discharge Instructions (Signed)
No evidence of heart problems today.  Pain you are experiencing most likely related to your stomach.  Start taking the carafate for the next few weeks and avoid ibuprofen/aleve or Alka-Seltzer.  If you start having persistent vomiting, fever, severe abdominal pain or inability to breathe return to the emergency room

## 2020-06-15 DIAGNOSIS — Z6826 Body mass index (BMI) 26.0-26.9, adult: Secondary | ICD-10-CM | POA: Diagnosis not present

## 2020-06-15 DIAGNOSIS — M5481 Occipital neuralgia: Secondary | ICD-10-CM | POA: Diagnosis not present

## 2020-06-15 DIAGNOSIS — M5412 Radiculopathy, cervical region: Secondary | ICD-10-CM | POA: Diagnosis not present

## 2020-06-15 DIAGNOSIS — M40202 Unspecified kyphosis, cervical region: Secondary | ICD-10-CM | POA: Diagnosis not present

## 2020-06-15 DIAGNOSIS — I10 Essential (primary) hypertension: Secondary | ICD-10-CM | POA: Diagnosis not present

## 2020-07-06 ENCOUNTER — Other Ambulatory Visit: Payer: Self-pay | Admitting: Neurological Surgery

## 2020-07-06 ENCOUNTER — Other Ambulatory Visit (HOSPITAL_COMMUNITY): Payer: Self-pay | Admitting: Neurological Surgery

## 2020-07-06 DIAGNOSIS — M5412 Radiculopathy, cervical region: Secondary | ICD-10-CM

## 2020-07-11 ENCOUNTER — Ambulatory Visit (HOSPITAL_COMMUNITY): Payer: Medicare Other

## 2020-07-14 ENCOUNTER — Ambulatory Visit (HOSPITAL_COMMUNITY): Payer: Medicare Other

## 2020-07-14 ENCOUNTER — Encounter (HOSPITAL_COMMUNITY): Payer: Self-pay

## 2020-07-26 ENCOUNTER — Encounter: Payer: Self-pay | Admitting: Gastroenterology

## 2020-08-03 ENCOUNTER — Ambulatory Visit (HOSPITAL_COMMUNITY): Payer: Medicare Other

## 2020-08-03 ENCOUNTER — Encounter (HOSPITAL_COMMUNITY): Payer: Self-pay

## 2020-08-04 ENCOUNTER — Ambulatory Visit: Payer: Medicare Other | Admitting: Gastroenterology

## 2020-09-26 ENCOUNTER — Emergency Department (HOSPITAL_BASED_OUTPATIENT_CLINIC_OR_DEPARTMENT_OTHER): Payer: Medicare Other

## 2020-09-26 ENCOUNTER — Encounter (HOSPITAL_BASED_OUTPATIENT_CLINIC_OR_DEPARTMENT_OTHER): Payer: Self-pay | Admitting: *Deleted

## 2020-09-26 ENCOUNTER — Emergency Department (HOSPITAL_BASED_OUTPATIENT_CLINIC_OR_DEPARTMENT_OTHER)
Admission: EM | Admit: 2020-09-26 | Discharge: 2020-09-26 | Disposition: A | Discharge status: Home / Self Care | Payer: Medicare Other | Attending: Emergency Medicine | Admitting: Emergency Medicine

## 2020-09-26 ENCOUNTER — Ambulatory Visit (HOSPITAL_COMMUNITY)
Admission: EM | Admit: 2020-09-26 | Discharge: 2020-09-26 | Discharge status: Against Medical Advice | Payer: Medicare Other

## 2020-09-26 ENCOUNTER — Other Ambulatory Visit: Payer: Self-pay

## 2020-09-26 DIAGNOSIS — E11319 Type 2 diabetes mellitus with unspecified diabetic retinopathy without macular edema: Secondary | ICD-10-CM | POA: Insufficient documentation

## 2020-09-26 DIAGNOSIS — Z79899 Other long term (current) drug therapy: Secondary | ICD-10-CM | POA: Insufficient documentation

## 2020-09-26 DIAGNOSIS — I10 Essential (primary) hypertension: Secondary | ICD-10-CM | POA: Diagnosis not present

## 2020-09-26 DIAGNOSIS — N200 Calculus of kidney: Secondary | ICD-10-CM | POA: Diagnosis not present

## 2020-09-26 DIAGNOSIS — I2510 Atherosclerotic heart disease of native coronary artery without angina pectoris: Secondary | ICD-10-CM | POA: Diagnosis not present

## 2020-09-26 DIAGNOSIS — Z87891 Personal history of nicotine dependence: Secondary | ICD-10-CM | POA: Diagnosis not present

## 2020-09-26 DIAGNOSIS — R109 Unspecified abdominal pain: Secondary | ICD-10-CM | POA: Diagnosis present

## 2020-09-26 DIAGNOSIS — Z7982 Long term (current) use of aspirin: Secondary | ICD-10-CM | POA: Diagnosis not present

## 2020-09-26 LAB — CBC WITH DIFFERENTIAL/PLATELET
Abs Immature Granulocytes: 0.02 10*3/uL (ref 0.00–0.07)
Basophils Absolute: 0 10*3/uL (ref 0.0–0.1)
Basophils Relative: 1 %
Eosinophils Absolute: 0.1 10*3/uL (ref 0.0–0.5)
Eosinophils Relative: 1 %
HCT: 37.1 % — ABNORMAL LOW (ref 39.0–52.0)
Hemoglobin: 12.8 g/dL — ABNORMAL LOW (ref 13.0–17.0)
Immature Granulocytes: 0 %
Lymphocytes Relative: 25 %
Lymphs Abs: 1.4 10*3/uL (ref 0.7–4.0)
MCH: 30.6 pg (ref 26.0–34.0)
MCHC: 34.5 g/dL (ref 30.0–36.0)
MCV: 88.8 fL (ref 80.0–100.0)
Monocytes Absolute: 0.5 10*3/uL (ref 0.1–1.0)
Monocytes Relative: 9 %
Neutro Abs: 3.5 10*3/uL (ref 1.7–7.7)
Neutrophils Relative %: 64 %
Platelets: 196 10*3/uL (ref 150–400)
RBC: 4.18 MIL/uL — ABNORMAL LOW (ref 4.22–5.81)
RDW: 13.2 % (ref 11.5–15.5)
WBC: 5.5 10*3/uL (ref 4.0–10.5)
nRBC: 0 % (ref 0.0–0.2)

## 2020-09-26 LAB — URINALYSIS, MICROSCOPIC (REFLEX): RBC / HPF: >50 RBC/hpf (ref 0–5)

## 2020-09-26 LAB — URINALYSIS, ROUTINE W REFLEX MICROSCOPIC
Bilirubin Urine: NEGATIVE
Glucose, UA: NEGATIVE mg/dL
Ketones, ur: NEGATIVE mg/dL
Leukocytes,Ua: NEGATIVE
Nitrite: NEGATIVE
Protein, ur: NEGATIVE mg/dL
Specific Gravity, Urine: >1.03 — ABNORMAL HIGH (ref 1.005–1.030)
pH: 5.5 (ref 5.0–8.0)

## 2020-09-26 LAB — COMPREHENSIVE METABOLIC PANEL
ALT: 17 U/L (ref 0–44)
AST: 21 U/L (ref 15–41)
Albumin: 4.2 g/dL (ref 3.5–5.0)
Alkaline Phosphatase: 49 U/L (ref 38–126)
Anion gap: 8 (ref 5–15)
BUN: 11 mg/dL (ref 6–20)
CO2: 26 mmol/L (ref 22–32)
Calcium: 9.3 mg/dL (ref 8.9–10.3)
Chloride: 102 mmol/L (ref 98–111)
Creatinine, Ser: 1.26 mg/dL — ABNORMAL HIGH (ref 0.61–1.24)
GFR, Estimated: >60 mL/min (ref >60)
Glucose, Bld: 92 mg/dL (ref 70–99)
Potassium: 3.7 mmol/L (ref 3.5–5.1)
Sodium: 136 mmol/L (ref 135–145)
Total Bilirubin: 0.3 mg/dL (ref 0.3–1.2)
Total Protein: 6.8 g/dL (ref 6.5–8.1)

## 2020-09-26 MED ORDER — SODIUM CHLORIDE 0.9 % IV BOLUS
1000.0000 mL | Freq: Once | INTRAVENOUS | Status: AC
  Administered 2020-09-26: 1000 mL via INTRAVENOUS

## 2020-09-26 MED ORDER — HYDROMORPHONE HCL 1 MG/ML IJ SOLN
1.0000 mg | Freq: Once | INTRAMUSCULAR | Status: AC
  Administered 2020-09-26: 1 mg via INTRAVENOUS
  Filled 2020-09-26: qty 1

## 2020-09-26 MED ORDER — HYDROMORPHONE HCL 2 MG PO TABS: 2.0000 mg | ORAL_TABLET | Freq: Four times a day (QID) | ORAL | 0 refills | Status: DC | PRN

## 2020-09-26 MED ORDER — KETOROLAC TROMETHAMINE 30 MG/ML IJ SOLN
30.0000 mg | Freq: Once | INTRAMUSCULAR | Status: AC
  Administered 2020-09-26: 30 mg via INTRAVENOUS
  Filled 2020-09-26: qty 1

## 2020-09-26 MED ORDER — ONDANSETRON HCL 4 MG/2ML IJ SOLN
4.0000 mg | Freq: Once | INTRAMUSCULAR | Status: AC
  Administered 2020-09-26: 4 mg via INTRAVENOUS
  Filled 2020-09-26: qty 2

## 2020-09-26 MED ORDER — HYDROMORPHONE HCL 1 MG/ML IJ SOLN
2.0000 mg | Freq: Once | INTRAMUSCULAR | Status: AC
  Administered 2020-09-26: 2 mg via INTRAVENOUS
  Filled 2020-09-26: qty 2

## 2020-09-26 NOTE — ED Triage Notes (Signed)
Right flank pain today. He had urgency yesterday.

## 2020-09-26 NOTE — ED Provider Notes (Signed)
[facility] EMERGENCY DEPARTMENT Provider Note   CSN: [id number] Arrival date & time: 09/26/20  1602     History Chief Complaint  Patient presents with  . Flank Pain    Richard Franco is a 55 y.o. male with past medical history significant for neck pain, takes Norco for chronic neck pain, CAD, diabetes who presents for evaluation of right flank pain.  Feels sharp and stabbing pain.  Has felt urinary urgency however was able to urinate.  No trauma or injury.  No history of kidney stones.  Had noted dark urine.  No emesis.  Has been taking his home Percocet without relief of his pain.  Denies fever, chills, nausea, vomiting, chest pain, shortness of breath, intra-abdominal pain, hematuria, diarrhea, constipation, paresthesias or weakness.  Rates current pain 8/10.  Pain started 3 hours PTA.  Denies additional aggravating or alleviating factors.  History obtained from patient and past medical records.  No interpreter used.  HPI     Past Medical History:  Diagnosis Date  . Abnormal chest CT 03/2012   a. 2013: Scattered patchy ground glass opacities and pulmonary nodules. Transbronchial biopsy with no definite etiology of lung finding- possible organizing pneumonia. b. F/u CT 05/2014: stable multiple tiny pulm nodules, no further w/u recommended per notes.   . Anxiety   . Arthritis   . CAD (coronary artery disease)   . Chronic midline posterior neck pain   . Chronic pain disorder   . Complication of anesthesia    " difficult to urinate after "  . Depression   . Diabetes ([place])   . Diabetic retinopathy ([place])   . Esophageal stricture   . Fibromyalgia   . Fracture    back  . GERD (gastroesophageal reflux disease)   . Hypercholesteremia   . Hypertension   . Migraine headache    "none lately; I've had alot in my life" (06/21/2015)  . NSTEMI (non-ST elevated myocardial infarction) ([place]) 07/2012   a. Normal cath 03/2012. b. 07/2012: troponin 4, normal cors, ?vasospasm. Did  not tolerate Imdur due to headache. On amlodipine.  . Stroke (cerebrum) ([place])   . Testosterone deficiency   . Tobacco abuse     Patient Active Problem List   Diagnosis Date Noted  . Throat swelling 07/06/2016  . Cocaine use 07/06/2016  . ETOH abuse 07/06/2016  . Dyspnea on exertion 07/04/2016  . Chest pain 07/04/2016  . Essential hypertension 07/04/2016  . AKI (acute kidney injury) ([place]) 07/04/2016  . Chest pain, atypical 11/16/2015  . Pleuritic chest pain 06/22/2015  . Tobacco abuse 06/22/2015  . History of coronary vasospasm 06/22/2015  . Smoking history 07/19/2014  . Chronic fatigue 07/19/2014  . Chronic arthralgias of knees and hips 11/26/2012  . Hyperlipidemia 09/25/2012  . GERD (gastroesophageal reflux disease) 08/11/2012  . BPH (benign prostatic hyperplasia) 08/11/2012  . Abnormal immunological finding in serum 05/03/2012  . Pulmonary infiltrates 04/16/2012  . Pulmonary nodule 03/25/2012  . Abnormal CT lung screening 03/25/2012  . History of tobacco abuse 02/18/2012  . Fibromyalgia 02/18/2012  . Hx of migraine headaches 02/18/2012  . DYSPNEA 08/24/2009  . CHEST PAIN-UNSPECIFIED 08/24/2009  . ABFND, FALSE POSITIVE SEROLOGIC TEST, [facility] 01/12/2007  . OSTEOARTHRITIS 01/05/2007    Past Surgical History:  Procedure Laterality Date  . ANKLE FUSION Left 2006  . BACK SURGERY    . BREAST LUMPECTOMY    . CERVICAL FUSION  X7 "last one 04/2011"  . COLONOSCOPY    . CYSTECTOMY    .  FRACTURE SURGERY    . LEFT HEART CATHETERIZATION WITH CORONARY ANGIOGRAM N/A 03/25/2012   Procedure: LEFT HEART CATHETERIZATION WITH CORONARY ANGIOGRAM;  Surgeon: Burnell Blanks, MD;  Location[facility] CATH LAB;  Service: Cardiovascular;  Laterality: N/A;  . LEFT HEART CATHETERIZATION WITH CORONARY ANGIOGRAM N/A 08/11/2012   Procedure: LEFT HEART CATHETERIZATION WITH CORONARY ANGIOGRAM;  Surgeon: Peter M Martinique, MD;  Location: [facility] CATH LAB;  Service: Cardiovascular;  Laterality: N/A;  . POSTERIOR  CERVICAL FUSION/FORAMINOTOMY  05/09/2011   Procedure: POSTERIOR CERVICAL FUSION/FORAMINOTOMY LEVEL 1;  Surgeon: Hosie Spangle;  Location: [facility] NEURO ORS;  Service: Neurosurgery;  Laterality: N/A;  C4/5 posterior arthrodesis with instrumentation   . UPPER GASTROINTESTINAL ENDOSCOPY    . VIDEO BRONCHOSCOPY  05/06/2012   Procedure: VIDEO BRONCHOSCOPY WITH FLUORO;  Surgeon: Jyl Heinz, MD;  Location: [facility] ENDOSCOPY;  Service: Cardiopulmonary;  Laterality: Bilateral;       Family History  Problem Relation Age of Onset  . Diabetes Mother   . Cancer Mother        ?  Marland Kitchen Hypertension Mother   . Hyperlipidemia Father   . Diabetes Father   . Coronary artery disease Paternal Uncle   . Asthma Sister   . Diabetes Brother   . Hypertension Brother   . Colon cancer Neg Hx   . Colon polyps Neg Hx   . Rectal cancer Neg Hx   . Stomach cancer Neg Hx     Social History   Tobacco Use  . Smoking status: Former Smoker    Packs/day: 1.00    Years: 28.00    Pack years: 28.00    Types: Cigarettes    Quit date: 12/16/2017    Years since quitting: 2.7  . Smokeless tobacco: Former Systems developer    Types: Chew  Substance Use Topics  . Alcohol use: Not Currently    Alcohol/week: 21.0 standard drinks    Types: 21 Cans of beer per week    Comment: 06/21/2015 "3 beers/day lately; more lately cause of being separated"   . Drug use: No    Home Medications Prior to Admission medications   Medication Sig Start Date End Date Taking? Authorizing Provider  amLODipine (NORVASC) 5 MG tablet Take 5 mg by mouth daily.    Yes [provider]  aspirin EC 81 MG EC tablet Take 1 tablet (81 mg total) by mouth daily. 08/12/12  Yes Dunn, Dayna N, PA-C  esomeprazole (NEXIUM) 40 MG capsule TAKE ONE CAPSULE BY MOUTH TWICE A DAY BEFORE A MEAL. Patient taking differently: Take 40 mg by mouth 2 (two) times daily before a meal. TAKE ONE CAPSULE BY MOUTH TWICE A DAY BEFORE A MEAL. 04/15/19  Yes Nandigam, Venia Minks, MD   FLUoxetine (PROZAC) 40 MG capsule Take 40 mg by mouth at bedtime.   Yes [provider]  HYDROcodone-acetaminophen (NORCO) 10-325 MG tablet Take 0.5 tablets by mouth every 6 (six) hours as needed for moderate pain. 10/12/19  Yes [provider]  HYDROmorphone (DILAUDID) 2 MG tablet Take 1 tablet (2 mg total) by mouth every 6 (six) hours as needed for severe pain. 09/26/20  Yes Orestes Geiman A, PA-C  lamoTRIgine (LAMICTAL) 150 MG tablet Take 300 mg by mouth at bedtime.   Yes [provider]  QUEtiapine (SEROQUEL) 50 MG tablet Take 50 mg by mouth at bedtime. 04/16/20  Yes [provider]  EPINEPHrine 0.3 mg/0.3 mL IJ SOAJ injection Inject 0.3 mg into the muscle once. 07/14/19   [provider]  hydrOXYzine (ATARAX/VISTARIL) 25 MG tablet Take 1 tablet (25 mg total) by mouth every 6 (six) hours. 10/23/19   Tacy Learn, PA-C  ibuprofen (ADVIL) 200 MG tablet Take 400 mg by mouth every 6 (six) hours as needed for mild pain.    [provider]  nitroGLYCERIN (NITROSTAT) 0.4 MG SL tablet DISSOLVE ONE TABLET UNDER THE TONGUE EVERY 5 MINUTES AS NEEDED FOR CHEST PAIN. DO NOT EXCEED A TOTAL OF 3 DOSES IN 15 MINUTES Patient taking differently: Place 0.4 mg under the tongue every 5 (five) minutes as needed for chest pain. DISSOLVE ONE TABLET UNDER THE TONGUE EVERY 5 MINUTES AS NEEDED FOR CHEST PAIN.  DO NOT EXCEED A TOTAL OF 3 DOSES IN 15 MINUTES 09/14/19   Lendon Colonel, NP  sucralfate (CARAFATE) 1 g tablet Take 1 tablet (1 g total) by mouth 4 (four) times daily -  with meals and at bedtime. 06/03/20   Blanchie Dessert, MD    Allergies    Midodrine hcl, Midodrine hcl, and Sulfa antibiotics  Review of Systems   Review of Systems  Constitutional: Negative.   HENT: Negative.   Respiratory: Negative.   Cardiovascular: Negative.   Gastrointestinal: Negative.   Genitourinary: Positive for flank pain, hematuria and urgency. Negative for decreased urine  volume, dysuria, enuresis, frequency, genital sores, penile discharge, penile pain, penile swelling, scrotal swelling and testicular pain.  Musculoskeletal: Positive for neck pain (Chronic, unchanged).  Skin: Negative.   Neurological: Negative.   All other systems reviewed and are negative.   Physical Exam Updated Vital Signs BP 126/80 (BP Location: Left Arm)   Pulse 64   Temp 97.8 F (36.6 C) (Oral)   Resp 18   Ht 5\' 8"  (1.727 m)   Wt 78.8 kg   SpO2 97%   BMI 26.41 kg/m   Physical Exam Vitals and nursing note reviewed.  Constitutional:      General: He is not in acute distress.    Appearance: He is well-developed. He is not ill-appearing, toxic-appearing or diaphoretic.  HENT:     Head: Normocephalic and atraumatic.     Nose: Nose normal.     Mouth/Throat:     Mouth: Mucous membranes are moist.  Eyes:     Pupils: Pupils are equal, round, and reactive to light.  Neck:     Comments: Full range of motion without difficulty Cardiovascular:     Rate and Rhythm: Normal rate and regular rhythm.     Pulses: Normal pulses.     Heart sounds: Normal heart sounds.  Pulmonary:     Effort: Pulmonary effort is normal. No respiratory distress.     Breath sounds: Normal breath sounds.     Comments: Speaks in full sentences without difficulty Abdominal:     General: Bowel sounds are normal. There is no distension.     Palpations: Abdomen is soft.     Tenderness: There is no abdominal tenderness. There is no right CVA tenderness, left CVA tenderness or guarding.     Comments: Negative CVA tap bilaterally.  Abdomen soft, nontender.  No midline pulsatile abdominal mass  Musculoskeletal:        General: Normal range of motion.     Cervical back: Normal range of motion and neck supple.     Comments: Compartments soft.  No bony tenderness.  Moves all 4 extremities  Skin:    General: Skin is warm and dry.     Capillary Refill: Capillary refill takes less  than 2 seconds.     Comments: No  edema, erythema or warmth.  No fluctuance or induration.  Neurological:     General: No focal deficit present.     Mental Status: He is alert and oriented to person, place, and time.     Comments: Cranial nerves II through gross intact Ambulatory without difficulty     ED Results / Procedures / Treatments   Labs (all labs ordered are listed, but only abnormal results are displayed) Labs Reviewed  URINALYSIS, ROUTINE W REFLEX MICROSCOPIC - Abnormal; Notable for the following components:      Result Value   APPearance HAZY (*)    Specific Gravity, Urine >1.030 (*)    Hgb urine dipstick LARGE (*)    All other components within normal limits  URINALYSIS, MICROSCOPIC (REFLEX) - Abnormal; Notable for the following components:   Bacteria, UA RARE (*)    All other components within normal limits  COMPREHENSIVE METABOLIC PANEL - Abnormal; Notable for the following components:   Creatinine, Ser 1.26 (*)    All other components within normal limits  CBC WITH DIFFERENTIAL/PLATELET - Abnormal; Notable for the following components:   RBC 4.18 (*)    Hemoglobin 12.8 (*)    HCT 37.1 (*)    All other components within normal limits    EKG None  Radiology CT Renal Stone Study  Result Date: 09/26/2020 CLINICAL DATA:  Right flank pain EXAM: CT ABDOMEN AND PELVIS WITHOUT CONTRAST TECHNIQUE: Multidetector CT imaging of the abdomen and pelvis was performed following the standard protocol without IV contrast. COMPARISON:  12/31/2011 FINDINGS: Lower chest: Lung bases are clear. No effusions. Heart is normal size. Hepatobiliary: No focal hepatic abnormality. Gallbladder unremarkable. Pancreas: No focal abnormality or ductal dilatation. Spleen: No focal abnormality.  Normal size. Adrenals/Urinary Tract: 2 mm right UVJ stone with mild right hydronephrosis. Punctate nonobstructing stone in the lower pole of the right kidney and upper pole of the right kidney. No stones or hydronephrosis on the left.  Exophytic cyst off the midpole of the left kidney measures 2.1 cm compared with 1.5 cm previously. Adrenal glands and urinary bladder unremarkable. Stomach/Bowel: Stomach, large and small bowel grossly unremarkable. Vascular/Lymphatic: Aortic atherosclerosis. No evidence of aneurysm or adenopathy. Reproductive: No visible focal abnormality. Other: No free fluid or free air. Musculoskeletal: No acute bony abnormality. IMPRESSION: 2 mm right UVJ stone with mild right hydronephrosis. Punctate right nephrolithiasis. Aortic atherosclerosis. Electronically Signed   By: Rolm Baptise M.D.   On: 09/26/2020 17:22    Procedures Procedures   Medications Ordered in ED Medications  sodium chloride 0.9 % bolus 1,000 mL (1,000 mLs Intravenous New Bag/Given 09/26/20 1804)  ketorolac (TORADOL) 30 MG/ML injection 30 mg (30 mg Intravenous Given 09/26/20 1803)  ondansetron (ZOFRAN) injection 4 mg (4 mg Intravenous Given 09/26/20 1803)  HYDROmorphone (DILAUDID) injection 1 mg (1 mg Intravenous Given 09/26/20 1804)  HYDROmorphone (DILAUDID) injection 2 mg (2 mg Intravenous Given 09/26/20 1851)    ED Course  I have reviewed the triage vital signs and the nursing notes.  Pertinent labs & imaging results that were available during my care of the patient were reviewed by me and considered in my medical decision making (see chart for details).  54 year old here for evaluation of right flank pain which began 3 hours PTA.  Noted hematuria.  No recent injury or trauma.  Afebrile, nonseptic, non-ill-appearing.  Neurovascularly intact.  Heart and lungs clear.  Abdomen soft, nontender.  Work-up started from  triage  Labs and imaging personally reviewed and interpreted:  CBC without leukocytosis Urine positive for blood however no evidence of UTI Metabolic panel creatinine 1.26, improved from baseline Ct stone with 2 mm UPJ stone with mild hydronephrosis  Patient reassessed.  Tolerating p.o. intake.  Pain controlled.  Likely cause  of patient's pain is his kidney stones.  Unfortunately has chronic pain is been taking his home hydrocodone which is not resolved his flank pain.  Will DC home with additional pain medicine.  I discussed with patient NOT taking his home pain medication as result could lead to respiratory depression and death if combined with his new pain medication.  He voiced understanding. He will contact his pain management provider tomorrow to let them know about receiving medications today.  There is no evidence of significant hydronephrosis, serum creatine WNL, vitals sign stable and the pt does not have irratractable vomiting.   The patient has been appropriately medically screened and/or stabilized in the ED. I have low suspicion for any other emergent medical condition which would require further screening, evaluation or treatment in the ED or require inpatient management.  Patient is hemodynamically stable and in no acute distress.  Patient able to ambulate in department prior to ED.  Evaluation does not show acute pathology that would require ongoing or additional emergent interventions while in the emergency department or further inpatient treatment.  I have discussed the diagnosis with the patient and answered all questions.  Pain is been managed while in the emergency department and patient has no further complaints prior to discharge.  Patient is comfortable with plan discussed in room and is stable for discharge at this time.  I have discussed strict return precautions for returning to the emergency department.  Patient was encouraged to follow-up with PCP/specialist refer to at discharge.   MDM Rules/Calculators/A&P                           Final Clinical Impression(s) / ED Diagnoses Final diagnoses:  Nephrolithiasis    Rx / DC Orders ED Discharge Orders         Ordered    HYDROmorphone (DILAUDID) 2 MG tablet  Every 6 hours PRN        09/26/20 1912           Fleta Borgeson A,  PA-C 09/26/20 1916    Charlesetta Shanks, MD 10/04/20 1236

## 2020-09-26 NOTE — Discharge Instructions (Addendum)
It was a pleasure taking care of you here in the emergency department today.  Your CT scan did show evidence of a kidney stone.  This is likely the cause of your pain.  Your kidney function was reassuring and your urine did not show any evidence of infection.  I have started you on some additional pain medicine.  It is imperative that you DO NOT take your home pain medicine while taking this additional pain medicine.  Taking them both together can cause respiratory depression and death.  Please let your pain management provider know that you are prescribed additional medication  Return for any worsening symptoms.

## 2020-09-26 NOTE — ED Triage Notes (Signed)
Emergency Medicine Provider Triage Evaluation Note  JENIEL SLAUSON , a 55 y.o. male  was evaluated in triage.  Pt complains of urinary urgency, right flank pain started 3 hours ago. No radiation.  Pain feels like a knife, intermittent.  No trauma.   NO hx of kidney stones.   Review of Systems  Positive: Right flank pain, urinary frequency, suprapubic pain  Negative: Dysuria, hematuria, nausea, vomiting, diarrhea, fevers.  Physical Exam  BP 126/79 (BP Location: Right Arm)   Pulse 77   Temp 98.5 F (36.9 C) (Oral)   Resp 16   Ht 5\' 8"  (1.727 m)   Wt 78.8 kg   SpO2 100%   BMI 26.41 kg/m  Gen:   Awake, no distress  HEENT:  Atraumatic  Resp:  Normal effort  Cardiac:  Normal rate Abd:   Mild tenderness to suprapubic region.  MSK:   No CVA tenderness Neuro:  Speech clear   Medical Decision Making  Medically screening exam initiated at 4:37 PM.  Appropriate orders placed.  KRISTIAN MOGG was informed that the remainder of the evaluation will be completed by another provider, this initial triage assessment does not replace that evaluation, and the importance of remaining in the ED until their evaluation is complete.  Clinical Impression  R flank pain, suspicious of kidney stone. He is stable. Afebrile.    Alfredia Client, PA-C 09/26/20 1642

## 2020-10-19 ENCOUNTER — Other Ambulatory Visit: Payer: Self-pay

## 2020-10-19 ENCOUNTER — Encounter (HOSPITAL_BASED_OUTPATIENT_CLINIC_OR_DEPARTMENT_OTHER): Payer: Self-pay | Admitting: *Deleted

## 2020-10-19 ENCOUNTER — Emergency Department (HOSPITAL_BASED_OUTPATIENT_CLINIC_OR_DEPARTMENT_OTHER)
Admission: EM | Admit: 2020-10-19 | Discharge: 2020-10-19 | Disposition: A | Discharge status: Home / Self Care | Payer: Medicare Other | Attending: Emergency Medicine | Admitting: Emergency Medicine

## 2020-10-19 DIAGNOSIS — R519 Headache, unspecified: Secondary | ICD-10-CM | POA: Diagnosis present

## 2020-10-19 DIAGNOSIS — I2510 Atherosclerotic heart disease of native coronary artery without angina pectoris: Secondary | ICD-10-CM | POA: Insufficient documentation

## 2020-10-19 DIAGNOSIS — Z79899 Other long term (current) drug therapy: Secondary | ICD-10-CM | POA: Diagnosis not present

## 2020-10-19 DIAGNOSIS — E119 Type 2 diabetes mellitus without complications: Secondary | ICD-10-CM | POA: Insufficient documentation

## 2020-10-19 DIAGNOSIS — Z7982 Long term (current) use of aspirin: Secondary | ICD-10-CM | POA: Diagnosis not present

## 2020-10-19 DIAGNOSIS — Z87891 Personal history of nicotine dependence: Secondary | ICD-10-CM | POA: Insufficient documentation

## 2020-10-19 DIAGNOSIS — I119 Hypertensive heart disease without heart failure: Secondary | ICD-10-CM | POA: Insufficient documentation

## 2020-10-19 MED ORDER — HYDROMORPHONE HCL 1 MG/ML IJ SOLN
1.0000 mg | Freq: Once | INTRAMUSCULAR | Status: AC
  Administered 2020-10-19: 1 mg via INTRAVENOUS
  Filled 2020-10-19: qty 1

## 2020-10-19 MED ORDER — DIPHENHYDRAMINE HCL 50 MG/ML IJ SOLN
50.0000 mg | Freq: Once | INTRAMUSCULAR | Status: AC
  Administered 2020-10-19: 50 mg via INTRAVENOUS
  Filled 2020-10-19: qty 1

## 2020-10-19 MED ORDER — OXYCODONE-ACETAMINOPHEN 5-325 MG PO TABS
1.0000 | ORAL_TABLET | Freq: Once | ORAL | Status: AC
  Administered 2020-10-19: 1 via ORAL
  Filled 2020-10-19: qty 1

## 2020-10-19 MED ORDER — PROMETHAZINE HCL 25 MG/ML IJ SOLN
25.0000 mg | Freq: Four times a day (QID) | INTRAMUSCULAR | Status: DC | PRN
  Administered 2020-10-19: 25 mg via INTRAVENOUS
  Filled 2020-10-19: qty 1

## 2020-10-19 MED ORDER — KETOROLAC TROMETHAMINE 30 MG/ML IJ SOLN
30.0000 mg | Freq: Once | INTRAMUSCULAR | Status: AC
  Administered 2020-10-19: 30 mg via INTRAVENOUS
  Filled 2020-10-19: qty 1

## 2020-10-19 NOTE — Discharge Instructions (Signed)
Please follow-up closely with your doctor for further managements of your recurrent headache and neck pain.

## 2020-10-19 NOTE — ED Triage Notes (Signed)
Headache x 2 days. Hx of headaches. No relief with OTC medications or Percocet.

## 2020-10-19 NOTE — ED Provider Notes (Signed)
[facility] EMERGENCY DEPARTMENT Provider Note   CSN: [id number] Arrival date & time: 10/19/20  1426     History Chief Complaint  Patient presents with  . Headache    Richard Franco is a 55 y.o. male.  The history is provided by the patient and medical records. No language interpreter was used.  Headache    55 year old male significant history of fibromyalgias, recurrent headache, tobacco abuse, hypertension, polysubstance abuse who presents for evaluation of headache.  Patient states he has chronic headache, persistent from chronic neck pain from multiple prior neck surgeries.  For the past 36 hours he has had progressive worsening neck pain and headache.  Pain is sharp, shooting to the left parietotemporal region of his head, constant, with associated nausea.  Headache felt similar to prior but more intense.  He tried taking his home medication without adequate relief.  He denies any associated fever chills runny nose sneezing coughing focal numbness focal weakness or rash.  He has had multiple neck surgeries in the past.  Patient denies light or sound sensitivity.  No report of vision changes.  Past Medical History:  Diagnosis Date  . Abnormal chest CT 03/2012   a. 2013: Scattered patchy ground glass opacities and pulmonary nodules. Transbronchial biopsy with no definite etiology of lung finding- possible organizing pneumonia. b. F/u CT 05/2014: stable multiple tiny pulm nodules, no further w/u recommended per notes.   . Anxiety   . Arthritis   . CAD (coronary artery disease)   . Chronic midline posterior neck pain   . Chronic pain disorder   . Complication of anesthesia    " difficult to urinate after "  . Depression   . Diabetes ([place])   . Diabetic retinopathy ([place])   . Esophageal stricture   . Fibromyalgia   . Fracture    back  . GERD (gastroesophageal reflux disease)   . Hypercholesteremia   . Hypertension   . Migraine headache    "none lately; I've had  alot in my life" (06/21/2015)  . NSTEMI (non-ST elevated myocardial infarction) ([place]) 07/2012   a. Normal cath 03/2012. b. 07/2012: troponin 4, normal cors, ?vasospasm. Did not tolerate Imdur due to headache. On amlodipine.  . Stroke (cerebrum) ([place])   . Testosterone deficiency   . Tobacco abuse     Patient Active Problem List   Diagnosis Date Noted  . Throat swelling 07/06/2016  . Cocaine use 07/06/2016  . ETOH abuse 07/06/2016  . Dyspnea on exertion 07/04/2016  . Chest pain 07/04/2016  . Essential hypertension 07/04/2016  . AKI (acute kidney injury) ([place]) 07/04/2016  . Chest pain, atypical 11/16/2015  . Pleuritic chest pain 06/22/2015  . Tobacco abuse 06/22/2015  . History of coronary vasospasm 06/22/2015  . Smoking history 07/19/2014  . Chronic fatigue 07/19/2014  . Chronic arthralgias of knees and hips 11/26/2012  . Hyperlipidemia 09/25/2012  . GERD (gastroesophageal reflux disease) 08/11/2012  . BPH (benign prostatic hyperplasia) 08/11/2012  . Abnormal immunological finding in serum 05/03/2012  . Pulmonary infiltrates 04/16/2012  . Pulmonary nodule 03/25/2012  . Abnormal CT lung screening 03/25/2012  . History of tobacco abuse 02/18/2012  . Fibromyalgia 02/18/2012  . Hx of migraine headaches 02/18/2012  . DYSPNEA 08/24/2009  . CHEST PAIN-UNSPECIFIED 08/24/2009  . ABFND, FALSE POSITIVE SEROLOGIC TEST, [facility] 01/12/2007  . OSTEOARTHRITIS 01/05/2007    Past Surgical History:  Procedure Laterality Date  . ANKLE FUSION Left 2006  . BACK SURGERY    . BREAST LUMPECTOMY    .  CERVICAL FUSION  X7 "last one 04/2011"  . COLONOSCOPY    . CYSTECTOMY    . FRACTURE SURGERY    . LEFT HEART CATHETERIZATION WITH CORONARY ANGIOGRAM N/A 03/25/2012   Procedure: LEFT HEART CATHETERIZATION WITH CORONARY ANGIOGRAM;  Surgeon: Burnell Blanks, MD;  Location: [facility] CATH LAB;  Service: Cardiovascular;  Laterality: N/A;  . LEFT HEART CATHETERIZATION WITH CORONARY ANGIOGRAM N/A 08/11/2012    Procedure: LEFT HEART CATHETERIZATION WITH CORONARY ANGIOGRAM;  Surgeon: Peter M Martinique, MD;  Location: [facility] CATH LAB;  Service: Cardiovascular;  Laterality: N/A;  . POSTERIOR CERVICAL FUSION/FORAMINOTOMY  05/09/2011   Procedure: POSTERIOR CERVICAL FUSION/FORAMINOTOMY LEVEL 1;  Surgeon: Hosie Spangle;  Location: [facility] NEURO ORS;  Service: Neurosurgery;  Laterality: N/A;  C4/5 posterior arthrodesis with instrumentation   . UPPER GASTROINTESTINAL ENDOSCOPY    . VIDEO BRONCHOSCOPY  05/06/2012   Procedure: VIDEO BRONCHOSCOPY WITH FLUORO;  Surgeon: Jyl Heinz, MD;  Location: [facility] ENDOSCOPY;  Service: Cardiopulmonary;  Laterality: Bilateral;       Family History  Problem Relation Age of Onset  . Diabetes Mother   . Cancer Mother        ?  Marland Kitchen Hypertension Mother   . Hyperlipidemia Father   . Diabetes Father   . Coronary artery disease Paternal Uncle   . Asthma Sister   . Diabetes Brother   . Hypertension Brother   . Colon cancer Neg Hx   . Colon polyps Neg Hx   . Rectal cancer Neg Hx   . Stomach cancer Neg Hx     Social History   Tobacco Use  . Smoking status: Former Smoker    Packs/day: 1.00    Years: 28.00    Pack years: 28.00    Types: Cigarettes    Quit date: 12/16/2017    Years since quitting: 2.8  . Smokeless tobacco: Former Systems developer    Types: Chew  Substance Use Topics  . Alcohol use: Not Currently    Alcohol/week: 21.0 standard drinks    Types: 21 Cans of beer per week    Comment: 06/21/2015 "3 beers/day lately; more lately cause of being separated"   . Drug use: No    Home Medications Prior to Admission medications   Medication Sig Start Date End Date Taking? Authorizing Provider  amLODipine (NORVASC) 5 MG tablet Take 5 mg by mouth daily.     [provider]  aspirin EC 81 MG EC tablet Take 1 tablet (81 mg total) by mouth daily. 08/12/12   Dunn, Nedra Hai, PA-C  EPINEPHrine 0.3 mg/0.3 mL IJ SOAJ injection Inject 0.3 mg into the muscle once. 07/14/19   [provider]  esomeprazole (NEXIUM) 40 MG capsule TAKE ONE CAPSULE BY MOUTH TWICE A DAY BEFORE A MEAL. Patient taking differently: Take 40 mg by mouth 2 (two) times daily before a meal. TAKE ONE CAPSULE BY MOUTH TWICE A DAY BEFORE A MEAL. 04/15/19   Mauri Pole, MD  FLUoxetine (PROZAC) 40 MG capsule Take 40 mg by mouth at bedtime.    [provider]  HYDROcodone-acetaminophen (NORCO) 10-325 MG tablet Take 0.5 tablets by mouth every 6 (six) hours as needed for moderate pain. 10/12/19   [provider]  HYDROmorphone (DILAUDID) 2 MG tablet Take 1 tablet (2 mg total) by mouth every 6 (six) hours as needed for severe pain. 09/26/20   Henderly, Britni A, PA-C  hydrOXYzine (ATARAX/VISTARIL) 25 MG tablet Take 1 tablet (25 mg total) by mouth every 6 (six)  hours. 10/23/19   Tacy Learn, PA-C  ibuprofen (ADVIL) 200 MG tablet Take 400 mg by mouth every 6 (six) hours as needed for mild pain.    [provider]  lamoTRIgine (LAMICTAL) 150 MG tablet Take 300 mg by mouth at bedtime.    [provider]  nitroGLYCERIN (NITROSTAT) 0.4 MG SL tablet DISSOLVE ONE TABLET UNDER THE TONGUE EVERY 5 MINUTES AS NEEDED FOR CHEST PAIN. DO NOT EXCEED A TOTAL OF 3 DOSES IN 15 MINUTES Patient taking differently: Place 0.4 mg under the tongue every 5 (five) minutes as needed for chest pain. DISSOLVE ONE TABLET UNDER THE TONGUE EVERY 5 MINUTES AS NEEDED FOR CHEST PAIN.  DO NOT EXCEED A TOTAL OF 3 DOSES IN 15 MINUTES 09/14/19   Lendon Colonel, NP  QUEtiapine (SEROQUEL) 50 MG tablet Take 50 mg by mouth at bedtime. 04/16/20   [provider]  sucralfate (CARAFATE) 1 g tablet Take 1 tablet (1 g total) by mouth 4 (four) times daily -  with meals and at bedtime. 06/03/20   Blanchie Dessert, MD    Allergies    Midodrine hcl, Midodrine hcl, and Sulfa antibiotics  Review of Systems   Review of Systems  Neurological: Positive for headaches.  All other systems reviewed and are  negative.   Physical Exam Updated Vital Signs BP 113/80 (BP Location: Left Arm)   Pulse 83   Temp 97.9 F (36.6 C) (Oral)   Resp 18   Ht 5\' 8"  (1.727 m)   Wt 76.2 kg   SpO2 100%   BMI 25.54 kg/m   Physical Exam Vitals and nursing note reviewed.  Constitutional:      General: He is not in acute distress.    Appearance: He is well-developed.  HENT:     Head: Normocephalic and atraumatic.  Eyes:     General: No visual field deficit.    Conjunctiva/sclera: Conjunctivae normal.  Neck:     Comments: Decreased neck range of motion but this is chronic and no nuchal rigidity.  Normal-appearing surgical scar to posterior neck Cardiovascular:     Rate and Rhythm: Normal rate and regular rhythm.     Heart sounds: Normal heart sounds.  Pulmonary:     Effort: Pulmonary effort is normal.     Breath sounds: Normal breath sounds.  Abdominal:     General: Bowel sounds are normal.     Palpations: Abdomen is soft.  Musculoskeletal:        General: Normal range of motion.     Cervical back: Neck supple.  Skin:    Findings: No rash.  Neurological:     Mental Status: He is alert and oriented to person, place, and time.     GCS: GCS eye subscore is 4. GCS verbal subscore is 5. GCS motor subscore is 6.     Cranial Nerves: No cranial nerve deficit or dysarthria.     Sensory: No sensory deficit.     Motor: No weakness.     Coordination: Romberg sign negative.     Gait: Gait normal.  Psychiatric:        Mood and Affect: Mood normal.        Speech: Speech normal.     ED Results / Procedures / Treatments   Labs (all labs ordered are listed, but only abnormal results are displayed) Labs Reviewed - No data to display  EKG None  Radiology No results found.  Procedures Procedures   Medications Ordered in ED  Medications  promethazine (PHENERGAN) injection 25 mg (25 mg Intravenous Given 10/19/20 1747)  ketorolac (TORADOL) 30 MG/ML injection 30 mg (30 mg Intravenous Given 10/19/20  1740)  diphenhydrAMINE (BENADRYL) injection 50 mg (50 mg Intravenous Given 10/19/20 1737)  HYDROmorphone (DILAUDID) injection 1 mg (1 mg Intravenous Given 10/19/20 1858)  oxyCODONE-acetaminophen (PERCOCET/ROXICET) 5-325 MG per tablet 1 tablet (1 tablet Oral Given 10/19/20 2044)    ED Course  I have reviewed the triage vital signs and the nursing notes.  Pertinent labs & imaging results that were available during my care of the patient were reviewed by me and considered in my medical decision making (see chart for details).    MDM Rules/Calculators/A&P                          BP 112/70   Pulse 81   Temp 97.9 F (36.6 C) (Oral)   Resp 20   Ht 5\' 8"  (1.727 m)   Wt 76.2 kg   SpO2 95%   BMI 25.54 kg/m   Final Clinical Impression(s) / ED Diagnoses Final diagnoses:  Bad headache    Rx / DC Orders ED Discharge Orders    None     5:20 PM Patient with chronic pain from multiple prior neck surgeries here with neck pain and headache.  This is acute on chronic neck pain and headache.  No red flags.  Will provide symptomatic treatment.  6:52 PM Patient was given medication for migraine cocktail but report no adequate improvement of his symptoms.  He request for additional pain management.  Will provide additional pain control and will continue to monitor.  Encourage patient to follow-up closely with his provider for further managements of his recurrent headache.   Domenic Moras, PA-C 10/19/20 2111    Fredia Sorrow, MD 10/20/20 [phone]

## 2020-10-19 NOTE — ED Notes (Signed)
Patient given a diet drink at this time

## 2020-10-27 ENCOUNTER — Ambulatory Visit (HOSPITAL_COMMUNITY)
Admission: RE | Admit: 2020-10-27 | Discharge: 2020-10-27 | Disposition: A | Discharge status: Home / Self Care | Payer: Medicare Other | Source: Ambulatory Visit | Attending: Neurological Surgery | Admitting: Neurological Surgery

## 2020-10-27 ENCOUNTER — Other Ambulatory Visit: Payer: Self-pay

## 2020-10-27 DIAGNOSIS — M5412 Radiculopathy, cervical region: Secondary | ICD-10-CM | POA: Insufficient documentation

## 2020-10-27 MED ORDER — SODIUM CHLORIDE (PF) 0.9 % IJ SOLN
INTRAMUSCULAR | Status: AC
  Filled 2020-10-27: qty 50

## 2020-10-27 MED ORDER — IOHEXOL 350 MG/ML SOLN
80.0000 mL | Freq: Once | INTRAVENOUS | Status: AC | PRN
  Administered 2020-10-27: 80 mL via INTRAVENOUS

## 2020-11-13 ENCOUNTER — Telehealth: Payer: Self-pay | Admitting: Cardiology

## 2020-11-13 ENCOUNTER — Telehealth: Payer: Self-pay | Admitting: Internal Medicine

## 2020-11-13 MED ORDER — NITROGLYCERIN 0.4 MG SL SUBL: 0.4000 mg | SUBLINGUAL_TABLET | SUBLINGUAL | 7 refills | Status: DC | PRN

## 2020-11-13 NOTE — Telephone Encounter (Signed)
Patient last seen 10/25/2019 and was instructed to follow up in 4-6wk. Pending OV 11/15/2020 at 10:30 with TP.  Patient is concerned about ground glass that was mentioned in previous imaging.  He reports of increased sob with exertion, chest tightness, occ wheezing and occ dry cough. Sx have worsened over the past month. denied fever, chills or sweats. He has two covid vaccines. He has not had booster or flu shot.  Patient is requesting recommendations to get him by until his OV.   MR, please advise. Thanks

## 2020-11-13 NOTE — Telephone Encounter (Signed)
Pt c/o of Chest Pain: STAT if CP now or developed within 24 hours  1. Are you having CP right now? Tightness in chest  2. Are you experiencing any other symptoms (ex. SOB, nausea, vomiting, sweating)? yes  3. How long have you been experiencing CP? About 3 weeks and its gettng worse  4. Is your CP continuous or coming and going? Comes and goes  5. Have you taken Nitroglycerin? no?

## 2020-11-13 NOTE — Telephone Encounter (Signed)
Spoke with pt, he reports a tightness in his chest for a couple weeks now but it seems to be staying more than leaving now. He also has SOB that has gotten worse over time. 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 He is seeing his pulmonary doctor on Wednesday this week. He feels like when he lays down it seems to get worse. He also reports in the last month he has had hot flashes and breaks out in a sweat. He reports this is a different type of symptoms he had prior to his MI. He denies any cough or congestion. Follow up appointment made to see DOD Wednesday this week. Patient voiced understanding to go to the ER if symptoms get worse or change prior to appointment.

## 2020-11-13 NOTE — Telephone Encounter (Signed)
Symptoms have been going on for 1 month.  He has an office visit coming up in 2 days.  Can he wait 2 days and get an opinion from his visit?  If it is that bad that he cannot wait 2 more days especially when it is been going on for 1 month he might have to go to the emergency department or an urgent care

## 2020-11-13 NOTE — Progress Notes (Signed)
Cardiology Office Note:   Date:  11/15/2020  NAME:  Richard Franco    MRN: [id number] DOB:  11-28-1965   PCP:  Merryl Hacker, No  Cardiologist:  Buford Dresser, MD  Electrophysiologist:  None   Referring MD: No ref. provider found   Chief Complaint  Patient presents with  . Chest Pain   History of Present Illness:   Richard Franco is a 55 y.o. male with a hx of HTN, HLD, tobacco abuse who presents for follow-up of chest pain. Has had CP for years. Had normal LHC 2013/2014. Question of vasospasm. Work-up for SOB by pulmonary normal. Reports for the last 1 month has had worsening sharp chest pain.  Describes occurring in the left chest.  He reports it can occur at any time.  Can be worse with laying down or worse with activity.  He reports it also can be worse with taking a breath in.  He reports he has the pain 75% of the day.  It is not getting better.  He is having pain in the office today.  His EKG demonstrates sinus rhythm heart rate 80 with no acute ischemic changes or evidence of infarction.  He has had normal work-ups in the past.  This included normal left heart catheterizations in 2014.  An echocardiogram in 2019 was normal.  He has groundglass opacities in his lungs and is followed by pulmonary.  Has had normal pulmonary function tests in the past.  He also reports he gets out of breath.  He is a 30-pack-year history.  There is no mention of emphysema on his CT scans.  He did have multiple small pulmonary nodules that were recommended to have follow-up.  He will see pulmonary later this week.  He also reports that he gets short of breath with activity.  Appears to not be associated with chest pain.  No evidence of heart failure.  No association with food although he does suffer from acid reflux.  CVD risk factors include hypertension and hyperlipidemia.  No recent lipid profile.  He does have a family history of heart disease.  He is not married and has 1 child.  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  He is able to work today but describes it is uncomfortable given the pain and shortness of breath.  No association with food for the chest pain.  He reports his acid reflux is well controlled.  He is overall concerned about his symptoms.  CT Chest 09/08/2018 0 CAC.   Problem List 1. HTN 2. HLD 3. Chronic chest pain -normal LHC 2013/2014 4. Esophageal stricture/GERD 5. Tobacco abuse  6. SOB -normal PFT 01/12/2018  Past Medical History: Past Medical History:  Diagnosis Date  . Abnormal chest CT 03/2012   a. 2013: Scattered patchy ground glass opacities and pulmonary nodules. Transbronchial biopsy with no definite etiology of lung finding- possible organizing pneumonia. b. F/u CT 05/2014: stable multiple tiny pulm nodules, no further w/u recommended per notes.   . Anxiety   . Arthritis   . CAD (coronary artery disease)   . Chronic midline posterior neck pain   . Chronic pain disorder   . Complication of anesthesia    " difficult to urinate after "  . Depression   . Esophageal stricture   . Fibromyalgia   . Fracture    back  . GERD (gastroesophageal reflux disease)   . Hypercholesteremia   . Hypertension   . Migraine headache    "none  lately; I've had alot in my life" (06/21/2015)  . NSTEMI (non-ST elevated myocardial infarction) ([place]) 07/2012   a. Normal cath 03/2012. b. 07/2012: troponin 4, normal cors, ?vasospasm. Did not tolerate Imdur due to headache. On amlodipine.  . Testosterone deficiency   . Tobacco abuse     Past Surgical History: Past Surgical History:  Procedure Laterality Date  . ANKLE FUSION Left 2006  . BACK SURGERY    . BREAST LUMPECTOMY    . CERVICAL FUSION  X7 "last one 04/2011"  . COLONOSCOPY    . CYSTECTOMY    . FRACTURE SURGERY    . LEFT HEART CATHETERIZATION WITH CORONARY ANGIOGRAM N/A 03/25/2012   Procedure: LEFT HEART CATHETERIZATION WITH CORONARY ANGIOGRAM;  Surgeon: Burnell Blanks, MD;  Location: [facility] CATH LAB;   Service: Cardiovascular;  Laterality: N/A;  . LEFT HEART CATHETERIZATION WITH CORONARY ANGIOGRAM N/A 08/11/2012   Procedure: LEFT HEART CATHETERIZATION WITH CORONARY ANGIOGRAM;  Surgeon: Peter M Martinique, MD;  Location: [facility] CATH LAB;  Service: Cardiovascular;  Laterality: N/A;  . POSTERIOR CERVICAL FUSION/FORAMINOTOMY  05/09/2011   Procedure: POSTERIOR CERVICAL FUSION/FORAMINOTOMY LEVEL 1;  Surgeon: Hosie Spangle;  Location: [facility]S;  Service: Neurosurgery;  Laterality: N/A;  C4/5 posterior arthrodesis with instrumentation   . UPPER GASTROINTESTINAL ENDOSCOPY    . VIDEO BRONCHOSCOPY  05/06/2012   Procedure: VIDEO BRONCHOSCOPY WITH FLUORO;  Surgeon: Jyl Heinz, MD;  Location: [facility] ENDOSCOPY;  Service: Cardiopulmonary;  Laterality: Bilateral;    Current Medications: Current Meds  Medication Sig  . amLODipine (NORVASC) 5 MG tablet Take 5 mg by mouth daily.   Marland Kitchen aspirin EC 81 MG EC tablet Take 1 tablet (81 mg total) by mouth daily.  Marland Kitchen EPINEPHrine 0.3 mg/0.3 mL IJ SOAJ injection Inject 0.3 mg into the muscle once.  Marland Kitchen esomeprazole (NEXIUM) 40 MG capsule TAKE ONE CAPSULE BY MOUTH TWICE A DAY BEFORE A MEAL. (Patient taking differently: Take 40 mg by mouth 2 (two) times daily before a meal. TAKE ONE CAPSULE BY MOUTH TWICE A DAY BEFORE A MEAL.)  . FLUoxetine (PROZAC) 40 MG capsule Take 40 mg by mouth at bedtime.  Marland Kitchen HYDROcodone-acetaminophen (NORCO) 10-325 MG tablet Take 0.5 tablets by mouth every 6 (six) hours as needed for moderate pain.  Marland Kitchen ibuprofen (ADVIL) 200 MG tablet Take 400 mg by mouth every 6 (six) hours as needed for mild pain.  Marland Kitchen ibuprofen (ADVIL) 800 MG tablet Take 1 tablet by mouth three times daily for 7 days.  Marland Kitchen lamoTRIgine (LAMICTAL) 150 MG tablet Take 300 mg by mouth at bedtime.  . nitroGLYCERIN (NITROSTAT) 0.4 MG SL tablet Place 1 tablet (0.4 mg total) under the tongue every 5 (five) minutes as needed for chest pain.  Marland Kitchen QUEtiapine (SEROQUEL) 50 MG tablet Take 50 mg by mouth at  bedtime.     Allergies:    Midodrine hcl, Midodrine hcl, and Sulfa antibiotics   Social History: Social History   Socioeconomic History  . Marital status: Divorced    Spouse name: Not on file  . Number of children: 1  . Years of education: Not on file  . Highest education level: Not on file  Occupational History  . Occupation: Chief Strategy Officer  Tobacco Use  . Smoking status: Former Smoker    Packs/day: 1.00    Years: 30.00    Pack years: 30.00    Types: Cigarettes    Quit date: 12/16/2017    Years since quitting: 2.9  . Smokeless tobacco: Former Systems developer  Types: Chew  Vaping Use  . Vaping Use: Never used  Substance and Sexual Activity  . Alcohol use: Not Currently    Alcohol/week: 21.0 standard drinks    Types: 21 Cans of beer per week    Comment: 06/21/2015 "3 beers/day lately; more lately cause of being separated"   . Drug use: No  . Sexual activity: Yes  Other Topics Concern  . Not on file  Social History Narrative  . Not on file   Social Determinants of Health   Financial Resource Strain: Not on file  Food Insecurity: Not on file  Transportation Needs: Not on file  Physical Activity: Not on file  Stress: Not on file  Social Connections: Not on file     Family History: The patient's family history includes Asthma in his sister; Cancer in his mother; Coronary artery disease in his paternal uncle; Diabetes in his brother, father, and mother; Hyperlipidemia in his father; Hypertension in his brother and mother. There is no history of Colon cancer, Colon polyps, Rectal cancer, or Stomach cancer.  ROS:   All other ROS reviewed and negative. Pertinent positives noted in the HPI.     EKGs/Labs/Other Studies Reviewed:   The following studies were personally reviewed by me today:  EKG:  EKG is ordered today.  The ekg ordered today demonstrates normal sinus rhythm no acute ischemic changes or evidence of infarction, and was personally reviewed by me.   TTE 07/05/2016  -  Left ventricle: Inferobasal hypokinesis. The cavity size was  normal. Systolic function was normal. The estimated ejection  fraction was in the range of 55% to 60%. Wall motion was normal;  there were no regional wall motion abnormalities. Left  ventricular diastolic function parameters were normal.  - Atrial septum: A patent foramen ovale cannot be excluded.   Recent Labs: 09/26/2020: ALT 17; BUN 11; Creatinine, Ser 1.26; Hemoglobin 12.8; Platelets 196; Potassium 3.7; Sodium 136   Recent Lipid Panel    Component Value Date/Time   CHOL 203 (H) 07/05/2016 0222   TRIG 134 07/05/2016 0222   HDL 54 07/05/2016 0222   CHOLHDL 3.8 07/05/2016 0222   VLDL 27 07/05/2016 0222   LDLCALC 122 (H) 07/05/2016 0222    Physical Exam:   VS:  BP 116/82   Pulse 80   Ht '5\' 8"'  (1.727 m)   Wt 168 lb 12.8 oz (76.6 kg)   SpO2 100%   BMI 25.67 kg/m    Wt Readings from Last 3 Encounters:  11/15/20 168 lb 12.8 oz (76.6 kg)  10/19/20 168 lb (76.2 kg)  09/26/20 173 lb 11.6 oz (78.8 kg)    General: Well nourished, well developed, in no acute distress Head: Atraumatic, normal size  Eyes: PEERLA, EOMI  Neck: Supple, no JVD Endocrine: No thryomegaly Cardiac: Normal S1, S2; RRR; no murmurs, rubs, or gallops Lungs: Clear to auscultation bilaterally, no wheezing, rhonchi or rales  Abd: Soft, nontender, no hepatomegaly  Ext: No edema, pulses 2+ Musculoskeletal: No deformities, BUE and BLE strength normal and equal Skin: Warm and dry, no rashes   Neuro: Alert and oriented to person, place, time, and situation, CNII-XII grossly intact, no focal deficits  Psych: Normal mood and affect   ASSESSMENT:   ORVIE CARADINE is a 55 y.o. male who presents for the following: 1. Chest pain, atypical   2. SOB (shortness of breath) on exertion   3. Chest pain, unspecified type     PLAN:   1. Chest pain, atypical  2. SOB (shortness of breath) on exertion -Presents with 1 month of atypical chest pain.   Described as sharp.  Occurring at any time.  Worse with laying down and worse with exertion.  It is not palpable.  His EKG in office demonstrates no acute ischemic changes or evidence of infarction.  He has had normal studies in the past this includes left heart catheterization in 2014.  I reviewed his CT chest from 2020 which shows no evidence of coronary calcium. -Symptoms are consistent with noncardiac chest pain.  He is low risk as he has no coronary calcium.  He does have CVD risk factors including smoking and hypertension.  He quit smoking several years ago. -No evidence of congestive heart failure examination.  He is euvolemic. -He reports he has been diagnosed with pleurisy in the past.  No component to acid reflux.  His acid reflux is well controlled. -Overall I suspect he is having noncardiac chest pain.  We will obtain an exercise nuclear stress test just to be sure.  He would be a great candidate for cardiac CTA however there is a contrast shortage.  We will get a good idea of functional capacity on the treadmill as well. -I would like to check an ESR and CRP just to make sure this is not pericarditis.  He does report pain worsens with laying down.  We will start him on a short course of ibuprofen as this could represent costochondritis.  He will take 800 mg ibuprofen 3 times daily. -We will obtain a BNP to exclude congestive heart failure.  Again he has no evidence of volume overload on exam today.  His echocardiogram was normal 3 years ago. -I have also recommend he follow-up with pulmonary given his smoking history.  He may need to be reevaluated for COPD.  I think this could be a logical explanation for his symptoms.  We will exclude any cardiac pathology.  Shared Decision Making/Informed Consent The risks [chest pain, shortness of breath, cardiac arrhythmias, dizziness, blood pressure fluctuations, myocardial infarction, stroke/transient ischemic attack, nausea, vomiting, allergic  reaction, radiation exposure, metallic taste sensation and life-threatening complications (estimated to be 1 in 10,000)], benefits (risk stratification, diagnosing coronary artery disease, treatment guidance) and alternatives of a nuclear stress test were discussed in detail with Mr. Marcell and he agrees to proceed.  Disposition: Return if symptoms worsen or fail to improve.  Medication Adjustments/Labs and Tests Ordered: Current medicines are reviewed at length with the patient today.  Concerns regarding medicines are outlined above.  Orders Placed This Encounter  Procedures  . Brain natriuretic peptide  . Sedimentation rate  . C-reactive protein  . MYOCARDIAL PERFUSION IMAGING  . EKG 12-Lead   Meds ordered this encounter  Medications  . ibuprofen (ADVIL) 800 MG tablet    Sig: Take 1 tablet by mouth three times daily for 7 days.    Dispense:  21 tablet    Refill:  0    Patient Instructions  Medication Instructions:  Take Ibuprofen 800 mg three times daily for 7 days (drink plenty of water with this)   *If you need a refill on your cardiac medications before your next appointment, please call your pharmacy*   Lab Work: BNP, CRP/ESR today   If you have labs (blood work) drawn today and your tests are completely normal, you will receive your results only by: Marland Kitchen MyChart Message (if you have MyChart) OR . A paper copy in the mail If you have any lab test  that is abnormal or we need to change your treatment, we will call you to review the results.   Testing/Procedures: Your physician has requested that you have an Exercise Myoview. A cardiac stress test is a cardiological test that measures the heart's ability to respond to external stress in a controlled clinical environment. The stress response is induced by exercise (exercise-treadmill). For further information please visit HugeFiesta.[email or web address]. If you have questions or concerns about your appointment, you can call the Nuclear  Lab at [phone].    Follow-Up: At [facility], you and your health needs are our priority.  As part of our continuing mission to provide you with exceptional heart care, we have created designated Provider Care Teams.  These Care Teams include your primary Cardiologist (physician) and Advanced Practice Providers (APPs -  Physician Assistants and Nurse Practitioners) who all work together to provide you with the care you need, when you need it.  We recommend signing up for the patient portal called "MyChart".  Sign up information is provided on this After Visit Summary.  MyChart is used to connect with patients for Virtual Visits (Telemedicine).  Patients are able to view lab/test results, encounter notes, upcoming appointments, etc.  Non-urgent messages can be sent to your provider as well.   To learn more about what you can do with MyChart, go to [email or web address].    Your next appointment:   As needed (based on testing results)  The format for your next appointment:   In Person  Provider:   Eleonore Chiquito, MD        Time Spent with Patient: I have spent a total of 35 minutes with patient reviewing hospital notes, telemetry, EKGs, labs and examining the patient as well as establishing an assessment and plan that was discussed with the patient.  > 50% of time was spent in direct patient care.  Signed, Addison Naegeli. Audie Box, MD, [facility]  [address] [place], [place] [address] [phone]  11/15/2020 10:18 AM

## 2020-11-13 NOTE — Telephone Encounter (Signed)
Called and spoke with pt letting him know the recs stated by MR and pt verbalized understanding stating that he can wait until Wed to be evaluated in person. Nothing further needed.

## 2020-11-15 ENCOUNTER — Ambulatory Visit (INDEPENDENT_AMBULATORY_CARE_PROVIDER_SITE_OTHER): Payer: Medicare Other | Admitting: Cardiovascular Disease

## 2020-11-15 ENCOUNTER — Encounter: Payer: Self-pay | Admitting: Adult Health

## 2020-11-15 ENCOUNTER — Ambulatory Visit (INDEPENDENT_AMBULATORY_CARE_PROVIDER_SITE_OTHER): Payer: Medicare Other | Admitting: Adult Health

## 2020-11-15 ENCOUNTER — Ambulatory Visit (INDEPENDENT_AMBULATORY_CARE_PROVIDER_SITE_OTHER): Payer: Medicare Other

## 2020-11-15 ENCOUNTER — Other Ambulatory Visit: Payer: Self-pay

## 2020-11-15 ENCOUNTER — Encounter: Payer: Self-pay | Admitting: Cardiovascular Disease

## 2020-11-15 VITALS — BP 114/80 | HR 82 | Temp 98.0°F | Ht 68.0 in | Wt 169.6 lb

## 2020-11-15 VITALS — BP 116/82 | HR 80 | Ht 68.0 in | Wt 168.8 lb

## 2020-11-15 DIAGNOSIS — R0789 Other chest pain: Secondary | ICD-10-CM

## 2020-11-15 DIAGNOSIS — R0609 Other forms of dyspnea: Secondary | ICD-10-CM

## 2020-11-15 DIAGNOSIS — R079 Chest pain, unspecified: Secondary | ICD-10-CM

## 2020-11-15 DIAGNOSIS — R918 Other nonspecific abnormal finding of lung field: Secondary | ICD-10-CM

## 2020-11-15 DIAGNOSIS — R0602 Shortness of breath: Secondary | ICD-10-CM | POA: Diagnosis not present

## 2020-11-15 DIAGNOSIS — R0600 Dyspnea, unspecified: Secondary | ICD-10-CM | POA: Diagnosis not present

## 2020-11-15 MED ORDER — ALBUTEROL SULFATE HFA 108 (90 BASE) MCG/ACT IN AERS: 1.0000 | INHALATION_SPRAY | Freq: Four times a day (QID) | RESPIRATORY_TRACT | 2 refills | Status: DC | PRN

## 2020-11-15 MED ORDER — BREO ELLIPTA 100-25 MCG/INH IN AEPB: 1.0000 | INHALATION_SPRAY | Freq: Every day | RESPIRATORY_TRACT | 0 refills | Status: DC

## 2020-11-15 MED ORDER — IBUPROFEN 800 MG PO TABS: ORAL_TABLET | ORAL | 0 refills | Status: DC

## 2020-11-15 MED ORDER — BREO ELLIPTA 100-25 MCG/INH IN AEPB: 1.0000 | INHALATION_SPRAY | Freq: Every day | RESPIRATORY_TRACT | 5 refills | Status: DC

## 2020-11-15 NOTE — Assessment & Plan Note (Signed)
Ongoing dyspnea with associated chest tightness and intermittent wheezing.  May have a component of mild intermittent asthma.  Give a trial of Breo. Check chest x-ray today.  And PFTs on return  Plan  Patient Instructions  Begin BREO 1 puff daily , rinse after use  Albuterol inhaler 1 puff every 6hr as needed.  Continue on Nexium  Follow up with Cardiology as planned.  Chest xray today .  Follow up with Dr. Chase Caller in 4-6 weeks with PFT and As needed   Please contact office for sooner follow up if symptoms do not improve or worsen or seek emergency care

## 2020-11-15 NOTE — Patient Instructions (Addendum)
Begin BREO 1 puff daily , rinse after use  Albuterol inhaler 1 puff every 6hr as needed.  Continue on Nexium  Follow up with Cardiology as planned.  Chest xray today .  Follow up with Dr. Chase Caller in 4-6 weeks with PFT and As needed   Please contact office for sooner follow up if symptoms do not improve or worsen or seek emergency care

## 2020-11-15 NOTE — Assessment & Plan Note (Signed)
Atypical chest pain questionable etiology.  Continue follow-up with cardiology. Stress test is pending. Check chest x-ray today.

## 2020-11-15 NOTE — Assessment & Plan Note (Signed)
History of pulmonary nodules and groundglass opacities on imaging.  We will check chest x-ray today.

## 2020-11-15 NOTE — Patient Instructions (Signed)
Medication Instructions:  Take Ibuprofen 800 mg three times daily for 7 days (drink plenty of water with this)   *If you need a refill on your cardiac medications before your next appointment, please call your pharmacy*   Lab Work: BNP, CRP/ESR today   If you have labs (blood work) drawn today and your tests are completely normal, you will receive your results only by: Marland Kitchen MyChart Message (if you have MyChart) OR . A paper copy in the mail If you have any lab test that is abnormal or we need to change your treatment, we will call you to review the results.   Testing/Procedures: Your physician has requested that you have an Exercise Myoview. A cardiac stress test is a cardiological test that measures the heart's ability to respond to external stress in a controlled clinical environment. The stress response is induced by exercise (exercise-treadmill). For further information please visit HugeFiesta.[email or web address]. If you have questions or concerns about your appointment, you can call the Nuclear Lab at [phone].    Follow-Up: At [facility], you and your health needs are our priority.  As part of our continuing mission to provide you with exceptional heart care, we have created designated Provider Care Teams.  These Care Teams include your primary Cardiologist (physician) and Advanced Practice Providers (APPs -  Physician Assistants and Nurse Practitioners) who all work together to provide you with the care you need, when you need it.  We recommend signing up for the patient portal called "MyChart".  Sign up information is provided on this After Visit Summary.  MyChart is used to connect with patients for Virtual Visits (Telemedicine).  Patients are able to view lab/test results, encounter notes, upcoming appointments, etc.  Non-urgent messages can be sent to your provider as well.   To learn more about what you can do with MyChart, go to [email or web address].    Your next appointment:   As  needed (based on testing results)  The format for your next appointment:   In Person  Provider:   Eleonore Chiquito, MD

## 2020-11-15 NOTE — Progress Notes (Signed)
@Patient  ID: Richard Franco, male    DOB: 18-May-1966, 55 y.o.   MRN: [id number]  Chief Complaint  Patient presents with  . Follow-up    Referring provider: No ref. provider found  HPI: 55 year old male former smoker followed for lung nodules and previous abnormal CT chest with groundglass opacities with possible COP that was responsive to steroids. Previously seen at [facility] by Dr. Randol Kern in 2014    TEST/EVENTS :  07/02/11 present visit -CT chest with resolution of GGO  - persistent nodules  -steroids decreased to 5 mg  05/06/12 Bronchoscopy with BAL, cell count and TBBx of the lingula  BAL cell count with elevated macrophages (possible COP, Hypersensitivity pneumonitis vs chronic microaspiration, possible Aluminum exposure)  RVP neg Viral culture neg  AFB smear neg, culture negative  Cytology/pathology benign with no evidence of aspiration or ILD  /22/13 Remains on 10 mg steroids;  -methacholine challenge neg -barium swallow WNL Alpha 1 MS 125   09/16/12  -CT with no GGO, lung nodules stable  Previous cardiopulmonary stress test in 2014 showed deconditioning. 2D echo in 2013 showed grade 1 diastolic dysfunction with EF of 55-60%. Coronary angiography in 2014 showed normal coronary anatomy 04/2016 normal with no restriction or airflow obstruction  2017 Exhaled nitric oxide today in the office i 11 ppb and normal  11/15/2020 Follow up :Dyspnea  Patient presents for a follow-up visit.  Last seen 1 year ago.  Patient complains that he has ongoing chest tightness, wheezing, shortness of breath.   patient has a history of seem to be worse when he tries to lay down at night.  Schronic chest pain.  Says this is been worse for the last couple months.  Describes it as a chronic squeezing sensation in his chest wall sometimes sharp in nature.  Symptoms patient denies any reflux.  He is taking Nexium twice daily.  No dysphagia.  No orthopnea PND syncope or  palpitations. Does have a  minimal dry cough.  Previous pulmonary function testing July 2019 showed normal lung function with no airflow obstruction or restriction.  FEV1 was 108%, ratio 84, FVC 100% no significant bronchodilator response, DLCO was 89%.  Seen by cardiology this morning felt to have atypical chest pain.  Was started on nonsteroidals.  Set up for a stress test.  And lab work including CRP, sed rate, BNP.     Allergies  Allergen Reactions  . Midodrine Hcl Swelling    Tongue swelling  . Midodrine Hcl Swelling    Tongue swelling Tongue swelling Tongue swelling  . Sulfa Antibiotics Rash    Break outs Break outs    Immunization History  Administered Date(s) Administered  . Influenza Inj Mdck Quad Pf 05/30/2017  . Influenza Split 04/16/2012  . Influenza,inj,Quad PF,6+ Mos 03/01/2013, 05/08/2015, 05/30/2017  . Influenza,inj,quad, With Preservative 05/02/2016, 05/30/2017, 06/02/2018  . Influenza-Unspecified 05/02/2016, 06/02/2018  . Moderna Sars-Covid-2 Vaccination 02/07/2020, 03/15/2020  . Pneumococcal Polysaccharide-23 04/16/2012  . Pneumococcal-Unspecified 04/16/2012  . Tdap 06/02/2018    Past Medical History:  Diagnosis Date  . Abnormal chest CT 03/2012   a. 2013: Scattered patchy ground glass opacities and pulmonary nodules. Transbronchial biopsy with no definite etiology of lung finding- possible organizing pneumonia. b. F/u CT 05/2014: stable multiple tiny pulm nodules, no further w/u recommended per notes.   . Anxiety   . Arthritis   . CAD (coronary artery disease)   . Chronic midline posterior neck pain   . Chronic pain disorder   .  Complication of anesthesia    " difficult to urinate after "  . Depression   . Esophageal stricture   . Fibromyalgia   . Fracture    back  . GERD (gastroesophageal reflux disease)   . Hypercholesteremia   . Hypertension   . Migraine headache    "none lately; I've had alot in my life" (06/21/2015)  . NSTEMI (non-ST  elevated myocardial infarction) ([place]) 07/2012   a. Normal cath 03/2012. b. 07/2012: troponin 4, normal cors, ?vasospasm. Did not tolerate Imdur due to headache. On amlodipine.  . Testosterone deficiency   . Tobacco abuse     Tobacco History: Social History   Tobacco Use  Smoking Status Former Smoker  . Packs/day: 1.00  . Years: 30.00  . Pack years: 30.00  . Types: Cigarettes  . Quit date: 12/16/2017  . Years since quitting: 2.9  Smokeless Tobacco Former Systems developer  . Types: Chew   Counseling given: Not Answered   Outpatient Medications Prior to Visit  Medication Sig Dispense Refill  . amLODipine (NORVASC) 5 MG tablet Take 5 mg by mouth daily.     Marland Kitchen aspirin EC 81 MG EC tablet Take 1 tablet (81 mg total) by mouth daily.    Marland Kitchen EPINEPHrine 0.3 mg/0.3 mL IJ SOAJ injection Inject 0.3 mg into the muscle once.    Marland Kitchen esomeprazole (NEXIUM) 40 MG capsule TAKE ONE CAPSULE BY MOUTH TWICE A DAY BEFORE A MEAL. (Patient taking differently: Take 40 mg by mouth 2 (two) times daily before a meal. TAKE ONE CAPSULE BY MOUTH TWICE A DAY BEFORE A MEAL.) 180 capsule 3  . FLUoxetine (PROZAC) 40 MG capsule Take 40 mg by mouth at bedtime.    Marland Kitchen HYDROcodone-acetaminophen (NORCO) 10-325 MG tablet Take 0.5 tablets by mouth every 6 (six) hours as needed for moderate pain.    Marland Kitchen ibuprofen (ADVIL) 200 MG tablet Take 400 mg by mouth every 6 (six) hours as needed for mild pain.    Marland Kitchen ibuprofen (ADVIL) 800 MG tablet Take 1 tablet by mouth three times daily for 7 days. 21 tablet 0  . lamoTRIgine (LAMICTAL) 150 MG tablet Take 300 mg by mouth at bedtime.    . nitroGLYCERIN (NITROSTAT) 0.4 MG SL tablet Place 1 tablet (0.4 mg total) under the tongue every 5 (five) minutes as needed for chest pain. 25 tablet 7  . QUEtiapine (SEROQUEL) 50 MG tablet Take 50 mg by mouth at bedtime.     No facility-administered medications prior to visit.     Review of Systems:   Constitutional:   No  weight loss, night sweats,  Fevers, chills,   +fatigue, or  lassitude.  HEENT:   No headaches,  Difficulty swallowing,  Tooth/dental problems, or  Sore throat,                No sneezing, itching, ear ache, nasal congestion, post nasal drip,   CV:  No   Orthopnea, PND, swelling in lower extremities, anasarca, dizziness, palpitations, syncope.   GI  No heartburn, indigestion, abdominal pain, nausea, vomiting, diarrhea, change in bowel habits, loss of appetite, bloody stools.   Resp:    No chest wall deformity  Skin: no rash or lesions.  GU: no dysuria, change in color of urine, no urgency or frequency.  No flank pain, no hematuria   MS: Chronic neck pain   Physical Exam  BP 114/80 (BP Location: Left Arm, Patient Position: Sitting, Cuff Size: Normal)   Pulse 82   Temp 98  F (36.7 C) (Temporal)   Ht 5\' 8"  (1.727 m)   Wt 169 lb 9.6 oz (76.9 kg)   SpO2 97%   BMI 25.79 kg/m   GEN: A/Ox3; pleasant , NAD, well nourished    HEENT:  [place]/AT,  EACs-clear, TMs-wnl, NOSE-clear, THROAT-clear, no lesions, no postnasal drip or exudate noted.   NECK:  Supple w/ fair ROM; no JVD; normal carotid impulses w/o bruits; no thyromegaly or nodules palpated; no lymphadenopathy.    RESP  Clear  P & A; w/o, wheezes/ rales/ or rhonchi. no accessory muscle use, no dullness to percussion  CARD:  RRR, no m/r/g, no peripheral edema, pulses intact, no cyanosis or clubbing.  GI:   Soft & nt; nml bowel sounds; no organomegaly or masses detected.   Musco: Warm bil, no deformities or joint swelling noted.   Neuro: alert, no focal deficits noted.    Skin: Warm, no lesions or rashes    Lab Results:  CBC    Component Value Date/Time   WBC 5.5 09/26/2020 1645   RBC 4.18 (L) 09/26/2020 1645   HGB 12.8 (L) 09/26/2020 1645   HGB 15.0 09/18/2015 0845   HCT 37.1 (L) 09/26/2020 1645   HCT 44.3 09/18/2015 0845   PLT 196 09/26/2020 1645   PLT 235 09/18/2015 0845   MCV 88.8 09/26/2020 1645   MCV 93 09/18/2015 0845   MCH 30.6 09/26/2020 1645    MCHC 34.5 09/26/2020 1645   RDW 13.2 09/26/2020 1645   RDW 14.1 09/18/2015 0845   LYMPHSABS 1.4 09/26/2020 1645   LYMPHSABS 1.3 09/18/2015 0845   MONOABS 0.5 09/26/2020 1645   EOSABS 0.1 09/26/2020 1645   EOSABS 0.2 09/18/2015 0845   BASOSABS 0.0 09/26/2020 1645   BASOSABS 0.0 09/18/2015 0845    BMET    Component Value Date/Time   NA 136 09/26/2020 1645   NA 142 09/18/2015 0845   K 3.7 09/26/2020 1645   CL 102 09/26/2020 1645   CO2 26 09/26/2020 1645   GLUCOSE 92 09/26/2020 1645   BUN 11 09/26/2020 1645   BUN 11 09/18/2015 0845   CREATININE 1.26 (H) 09/26/2020 1645   CREATININE 1.14 03/24/2012 1646   CALCIUM 9.3 09/26/2020 1645   GFRNONAA >60 09/26/2020 1645   GFRAA >60 06/23/2019 1742    BNP    Component Value Date/Time   BNP 10.9 01/16/2018 1236    ProBNP    Component Value Date/Time   PROBNP 4.0 05/05/2012 1105    Imaging: CT ANGIO NECK W OR WO CONTRAST  Result Date: 10/28/2020 CLINICAL DATA:  Chronic neck pain.  Surgical planning. EXAM: CT ANGIOGRAPHY NECK TECHNIQUE: Multidetector CT imaging of the neck was performed using the standard protocol during bolus administration of intravenous contrast. Multiplanar CT image reconstructions and MIPs were obtained to evaluate the vascular anatomy. Carotid stenosis measurements (when applicable) are obtained utilizing NASCET criteria, using the distal internal carotid diameter as the denominator. CONTRAST:  23mL OMNIPAQUE IOHEXOL 350 MG/ML SOLN COMPARISON:  Cervical myelogram 01/05/2019 FINDINGS: Aortic arch: A 3 vessel arch configuration is present. No significant atherosclerotic disease, stenosis, or aneurysm is present. Right carotid system: Right common carotid artery is within normal limits. Bifurcation is unremarkable. Cervical right ICA is normal. Left carotid system: The left common carotid artery is within normal limits. Bifurcation is unremarkable. Cervical left ICA is normal. Vertebral arteries: The right vertebral  artery is the dominant vessel. Both vertebral arteries originate from the subclavian arteries without significant stenosis. There is no significant  stenosis in either vertebral artery in the neck. Vertebral arteries do not extend into the foramina at any level. Skeleton: Cervical fusion noted C3-7 anteriorly. Posterior elements are fused see 3-7 on the left and C4-7 on the right. No focal lytic or blastic lesions are present. Dental implants noted. Other neck: The soft tissues the neck are otherwise unremarkable. Upper chest: Lung apices are clear. Thoracic inlet is within normal limits. IMPRESSION: 1. Normal CTA of the neck. No significant stenosis or occlusion. 2. Vertebral arteries do not enter the neural foramina at any level. 3. Cervical fusion C3-7 anteriorly and posteriorly. Electronically Signed   By: San Morelle M.D.   On: 10/28/2020 16:45      PFT Results Latest Ref Rng & Units 01/12/2018  FVC-Pre L 4.69  FVC-Predicted Pre % 100  FVC-Post L 4.73  FVC-Predicted Post % 100  Pre FEV1/FVC % % 80  Post FEV1/FCV % % 84  FEV1-Pre L 3.77  FEV1-Predicted Pre % 103  FEV1-Post L 3.96  DLCO uncorrected ml/min/mmHg 26.61  DLCO UNC% % 89  DLCO corrected ml/min/mmHg 26.84  DLCO COR %Predicted % 90  DLVA Predicted % 92  TLC L 6.58  TLC % Predicted % 100  RV % Predicted % 88    Lab Results  Component Value Date   NITRICOXIDE 11 11/16/2015        Assessment & Plan:   Pulmonary infiltrates History of pulmonary nodules and groundglass opacities on imaging.  We will check chest x-ray today.  DYSPNEA Ongoing dyspnea with associated chest tightness and intermittent wheezing.  May have a component of mild intermittent asthma.  Give a trial of Breo. Check chest x-ray today.  And PFTs on return  Plan  Patient Instructions  Begin BREO 1 puff daily , rinse after use  Albuterol inhaler 1 puff every 6hr as needed.  Continue on Nexium  Follow up with Cardiology as planned.  Chest  xray today .  Follow up with Dr. Chase Caller in 4-6 weeks with PFT and As needed   Please contact office for sooner follow up if symptoms do not improve or worsen or seek emergency care       CHEST PAIN-UNSPECIFIED Atypical chest pain questionable etiology.  Continue follow-up with cardiology. Stress test is pending. Check chest x-ray today.     Rexene Edison, NP 11/15/2020

## 2020-11-16 LAB — C-REACTIVE PROTEIN: CRP: 1 mg/L (ref 0–10)

## 2020-11-16 LAB — BRAIN NATRIURETIC PEPTIDE: BNP: 16.1 pg/mL (ref 0.0–100.0)

## 2020-11-16 LAB — SEDIMENTATION RATE: Sed Rate: 5 mm/hr (ref 0–30)

## 2020-11-21 ENCOUNTER — Encounter (HOSPITAL_COMMUNITY): Payer: Medicare Other

## 2020-11-22 ENCOUNTER — Other Ambulatory Visit: Payer: Self-pay | Admitting: Neurological Surgery

## 2020-11-24 ENCOUNTER — Ambulatory Visit (HOSPITAL_COMMUNITY)
Admission: RE | Admit: 2020-11-24 | Payer: Medicare Other | Source: Ambulatory Visit | Attending: Cardiovascular Disease | Admitting: Cardiovascular Disease

## 2020-11-24 ENCOUNTER — Telehealth: Payer: Self-pay

## 2020-11-24 NOTE — Telephone Encounter (Signed)
    DARROW BARREIRO DOB:  1965-08-26  MRN:  [id number]   Primary Cardiologist: Buford Dresser, MD  Chart reviewed as part of pre-operative protocol coverage.GRISELDA TOSH was last seen 11/15/20 by Dr. Audie Box and recommended for stress test. This was initially scheduled for today but he has rescheduled for 11/29/20. This testing will need to be completed prior to providing recommendations.   Will route to requesting party so they are aware.   Loel Dubonnet, NP 11/24/2020, 9:50 AM

## 2020-11-24 NOTE — Telephone Encounter (Signed)
   [facility] Pre-operative Risk Assessment    Patient Name: Richard Franco  DOB: [birth year]  MRN: [id number]   HEARTCARE STAFF: - Please ensure there is not already an duplicate clearance open for this procedure. - Under Visit Info/Reason for Call, type in Other and utilize the format Clearance MM/DD/YY or Clearance TBD. Do not use dashes or single digits. - If request is for dental extraction, please clarify the # of teeth to be extracted. - If the patient is currently at the dentist's office, call Pre-Op APP to address. If the patient is not currently in the dentist office, please route to the Pre-Op pool  Request for surgical clearance:  1. What type of surgery is being performed? C2 Ganglionectomy with extrension of C1 to C5 Fusion   2. When is this surgery scheduled? 12/06/2020   3. What type of clearance is required (medical clearance vs. Pharmacy clearance to hold med vs. Both)? Both  4. Are there any medications that need to be held prior to surgery and how long? Aspirin    5. Practice name and name of physician performing surgery? [facility] - Dr.Ostergard  6. What is the office phone number? [phone]   7.   What is the office fax number? [phone] - Attention Jessica   8.   Anesthesia type (None, local, MAC, general) ? Unknown   Ena Dawley 11/24/2020, 9:23 AM  _________________________________________________________________   (provider comments below)

## 2020-11-28 ENCOUNTER — Telehealth (HOSPITAL_COMMUNITY): Payer: Self-pay | Admitting: *Deleted

## 2020-11-28 NOTE — Telephone Encounter (Signed)
Close encounter 

## 2020-11-29 ENCOUNTER — Ambulatory Visit (HOSPITAL_COMMUNITY)
Admission: RE | Admit: 2020-11-29 | Discharge: 2020-11-29 | Disposition: A | Discharge status: Home / Self Care | Payer: Medicare Other | Source: Ambulatory Visit | Attending: Cardiovascular Disease | Admitting: Cardiovascular Disease

## 2020-11-29 ENCOUNTER — Other Ambulatory Visit: Payer: Self-pay

## 2020-11-29 DIAGNOSIS — R079 Chest pain, unspecified: Secondary | ICD-10-CM | POA: Diagnosis present

## 2020-11-29 LAB — MYOCARDIAL PERFUSION IMAGING
LV dias vol: 99 mL (ref 62–150)
LV sys vol: 42 mL
Peak HR: 130 {beats}/min
Rest HR: 71 {beats}/min
SRS: 0
SSS: 5
TID: 0.97

## 2020-11-29 MED ORDER — REGADENOSON 0.4 MG/5ML IV SOLN
0.4000 mg | Freq: Once | INTRAVENOUS | Status: AC
  Administered 2020-11-29: 0.4 mg via INTRAVENOUS

## 2020-11-29 MED ORDER — TECHNETIUM TC 99M TETROFOSMIN IV KIT
9.9000 | PACK | Freq: Once | INTRAVENOUS | Status: AC | PRN
  Administered 2020-11-29: 9.9 via INTRAVENOUS
  Filled 2020-11-29: qty 10

## 2020-11-29 MED ORDER — TECHNETIUM TC 99M TETROFOSMIN IV KIT
32.7000 | PACK | Freq: Once | INTRAVENOUS | Status: AC | PRN
  Administered 2020-11-29: 32.7 via INTRAVENOUS
  Filled 2020-11-29: qty 33

## 2020-12-01 NOTE — Pre-Procedure Instructions (Signed)
Surgical Instructions:    Your procedure is scheduled on Wednesday, June 15th.  Report to [facility] Main Entrance "A" at 10:45 A.M., then check in with the Admitting office.  Call this number if you have any questions prior to, or have any problems the morning of surgery:  [phone]    Remember:  Do not eat or drink after midnight the night before your surgery.    Take these medicines the morning of surgery with A SIP OF WATER: esomeprazole (NEXIUM) fluticasone furoate-vilanterol (BREO ELLIPTA) inhaler   IF NEEDED: oxyCODONE-acetaminophen (PERCOCET)  nitroGLYCERIN (NITROSTAT) Propylene Glycol (SYSTANE BALANCE) eye drops albuterol (VENTOLIN HFA) inhaler- Please bring with you the day of surgery.    *Follow your surgeon's instructions on when to stop Aspirin.  If no instructions were given by your surgeon then you will need to call the office to get those instructions.     As of today, STOP taking any Aspirin-Caffeine (BC FAST PAIN RELIEF ARTHRITIS), Aleve, Naproxen, Ibuprofen, Motrin, Advil, Goody's, BC's, all herbal medications, fish oil, and all vitamins.               Special instructions:   [facility]- Preparing For Surgery  Before surgery, you can play an important role. Because skin is not sterile, your skin needs to be as free of germs as possible. You can reduce the number of germs on your skin by washing with CHG (chlorahexidine gluconate) Soap before surgery.  CHG is an antiseptic cleaner which kills germs and bonds with the skin to continue killing germs even after washing.    Oral Hygiene is also important to reduce your risk of infection.  Remember - BRUSH YOUR TEETH THE MORNING OF SURGERY WITH YOUR REGULAR TOOTHPASTE  Please do not use if you have an allergy to CHG or antibacterial soaps. If your skin becomes reddened/irritated stop using the CHG.  Do not shave (including legs and underarms) for at least 48 hours prior to first CHG shower. It is OK to  shave your face.  Please follow these instructions carefully.   Shower the NIGHT BEFORE SURGERY and the MORNING OF SURGERY  If you chose to wash your hair, wash your hair first as usual with your normal shampoo.  After you shampoo, rinse your hair and body thoroughly to remove the shampoo.  Wash Face and genitals (private parts) with your normal soap.   Use CHG Soap as you would any other liquid soap. You can apply CHG directly to the skin and wash gently with a scrungie or a clean washcloth.   Apply the CHG Soap to your body ONLY FROM THE NECK DOWN.  Do not use on open wounds or open sores. Avoid contact with your eyes, ears, mouth and genitals (private parts). Wash Face and genitals (private parts)  with your normal soap.   Wash thoroughly, paying special attention to the area where your surgery will be performed.  Thoroughly rinse your body with warm water from the neck down.  DO NOT shower/wash with your normal soap after using and rinsing off the CHG Soap.  Pat yourself dry with a CLEAN TOWEL.  Wear CLEAN PAJAMAS to bed the night before surgery.  Place CLEAN SHEETS on your bed the night before your surgery.  DO NOT SLEEP WITH PETS.   Day of Surgery: SHOWER with CHG soap. Brush your teeth WITH YOUR REGULAR TOOTHPASTE. Wear Clean/Comfortable clothing the morning of surgery. Do not apply any deodorants/lotions.   Do not wear jewelry. Men  may shave face and neck. Do not bring valuables to the hospital. [facility] is not responsible for any belongings or valuables.  Do NOT Smoke (Tobacco/Vaping) or drink Alcohol 24 hours prior to your procedure.  If you use a CPAP at night, you may bring all equipment for your overnight stay.   Contacts, glasses, or dentures may not be worn into surgery, please bring cases for these belongings.   For patients admitted to the hospital, discharge time will be determined by your treatment team.   Patients discharged the day of surgery  will not be allowed to drive home, and someone needs to stay with them for 24 hours.    Please read over the following fact sheets that you were given.

## 2020-12-04 ENCOUNTER — Encounter (HOSPITAL_COMMUNITY)
Admission: RE | Admit: 2020-12-04 | Discharge: 2020-12-04 | Disposition: A | Discharge status: Home / Self Care | Payer: Medicare Other | Source: Ambulatory Visit | Attending: Neurological Surgery | Admitting: Neurological Surgery

## 2020-12-04 ENCOUNTER — Encounter (HOSPITAL_COMMUNITY): Payer: Self-pay

## 2020-12-04 ENCOUNTER — Other Ambulatory Visit: Payer: Self-pay

## 2020-12-04 DIAGNOSIS — Z20822 Contact with and (suspected) exposure to covid-19: Secondary | ICD-10-CM | POA: Insufficient documentation

## 2020-12-04 DIAGNOSIS — Z01812 Encounter for preprocedural laboratory examination: Secondary | ICD-10-CM | POA: Insufficient documentation

## 2020-12-04 DIAGNOSIS — Z87891 Personal history of nicotine dependence: Secondary | ICD-10-CM | POA: Insufficient documentation

## 2020-12-04 DIAGNOSIS — K219 Gastro-esophageal reflux disease without esophagitis: Secondary | ICD-10-CM | POA: Insufficient documentation

## 2020-12-04 DIAGNOSIS — I10 Essential (primary) hypertension: Secondary | ICD-10-CM | POA: Insufficient documentation

## 2020-12-04 DIAGNOSIS — Z7901 Long term (current) use of anticoagulants: Secondary | ICD-10-CM | POA: Insufficient documentation

## 2020-12-04 DIAGNOSIS — Z7982 Long term (current) use of aspirin: Secondary | ICD-10-CM | POA: Insufficient documentation

## 2020-12-04 DIAGNOSIS — Z79899 Other long term (current) drug therapy: Secondary | ICD-10-CM | POA: Insufficient documentation

## 2020-12-04 DIAGNOSIS — I252 Old myocardial infarction: Secondary | ICD-10-CM | POA: Insufficient documentation

## 2020-12-04 DIAGNOSIS — M47812 Spondylosis without myelopathy or radiculopathy, cervical region: Secondary | ICD-10-CM | POA: Insufficient documentation

## 2020-12-04 DIAGNOSIS — M797 Fibromyalgia: Secondary | ICD-10-CM | POA: Insufficient documentation

## 2020-12-04 LAB — BASIC METABOLIC PANEL
Anion gap: 11 (ref 5–15)
BUN: 10 mg/dL (ref 6–20)
CO2: 25 mmol/L (ref 22–32)
Calcium: 9.5 mg/dL (ref 8.9–10.3)
Chloride: 101 mmol/L (ref 98–111)
Creatinine, Ser: 1.25 mg/dL — ABNORMAL HIGH (ref 0.61–1.24)
GFR, Estimated: >60 mL/min (ref >60)
Glucose, Bld: 117 mg/dL — ABNORMAL HIGH (ref 70–99)
Potassium: 4.2 mmol/L (ref 3.5–5.1)
Sodium: 137 mmol/L (ref 135–145)

## 2020-12-04 LAB — CBC
HCT: 40.3 % (ref 39.0–52.0)
Hemoglobin: 13.3 g/dL (ref 13.0–17.0)
MCH: 30.2 pg (ref 26.0–34.0)
MCHC: 33 g/dL (ref 30.0–36.0)
MCV: 91.6 fL (ref 80.0–100.0)
Platelets: 245 10*3/uL (ref 150–400)
RBC: 4.4 MIL/uL (ref 4.22–5.81)
RDW: 13.2 % (ref 11.5–15.5)
WBC: 5 10*3/uL (ref 4.0–10.5)
nRBC: 0 % (ref 0.0–0.2)

## 2020-12-04 LAB — SURGICAL PCR SCREEN
MRSA, PCR: NEGATIVE
Staphylococcus aureus: POSITIVE — AB

## 2020-12-04 LAB — SARS CORONAVIRUS 2 (TAT 6-24 HRS): SARS Coronavirus 2: NEGATIVE

## 2020-12-04 NOTE — Telephone Encounter (Signed)
    Richard Franco DOB:  1966-01-02  MRN:  [id number]   Primary Cardiologist: Buford Dresser, MD  Chart reviewed as part of pre-operative protocol coverage. Given recent stress test with no evidence of ischemia or infarct, Richard Franco would be at acceptable risk from a CV standpoint for the planned procedure without further cardiovascular testing. I would suggest however, touching base with his pulmonary team to make sure they have no further recommendations prior to procedure.   The patient was advised that if he develops new symptoms prior to surgery to contact our office to arrange for a follow-up visit, and he verbalized understanding.  I will route this recommendation to the requesting party via Epic fax function and remove from pre-op pool.  Please call with questions.  Kathyrn Drown, NP 12/04/2020, 9:09 AM

## 2020-12-04 NOTE — Progress Notes (Signed)
PCP - denies Cardiologist - Dr. McLean/Dr. Audie Box   Chest x-ray - 11/16/20 EKG - 11/16/20 Stress Test - 11/29/20 ECHO - 07/05/16 Cardiac Cath - 03/25/12  Sleep Study - 20+ years ago; reported to be negative. CPAP - denies  Blood Thinner Instructions: n/a Aspirin Instructions:LD 2 weeks ago  COVID TEST- 12/04/20 in PAT  Anesthesia review: Yes, heart history. Pt stated that cardiac clearance was received by [facility]. He received a call today regarding his clearance.   Patient denies shortness of breath, fever, cough and chest pain at PAT appointment   All instructions explained to the patient, with a verbal understanding of the material. Patient agrees to go over the instructions while at home for a better understanding. Patient also instructed to self quarantine after being tested for COVID-19. The opportunity to ask questions was provided.

## 2020-12-05 NOTE — Anesthesia Preprocedure Evaluation (Addendum)
Anesthesia Evaluation  Patient identified by MRN, date of birth, ID band Patient awake    Reviewed: Allergy & Precautions, NPO status , Patient's Chart, lab work & pertinent test results  History of Anesthesia Complications Negative for: history of anesthetic complications  Airway Mallampati: II  TM Distance: >3 FB Neck ROM: Limited    Dental  (+) Dental Advisory Given, Implants   Pulmonary former smoker,    Pulmonary exam normal        Cardiovascular hypertension, Pt. on medications + Past MI (d/t vasospasm)  Normal cardiovascular exam   '22 Myoperfusion - Nuclear stress EF: 57%. The left ventricular ejection fraction is normal (55-65%). There was no ST segment deviation noted during stress. No T wave inversion was noted during stress. There is a medium fixed defect in basal-to-apical inferior LV wall that is likely due to artifact due to significant extracardiac tracer uptake vs less likely infarct. Wall motion is normal. This is a low risk study.  '18 TTE - Inferobasal hypokinesis. EF 55% to 60%. A patent foramen ovale cannot be excluded. Mild TR. Trivial MR and PR.    Neuro/Psych  Headaches, PSYCHIATRIC DISORDERS Anxiety Depression    GI/Hepatic Neg liver ROS, GERD  Medicated and Controlled,  Endo/Other  negative endocrine ROS  Renal/GU Renal InsufficiencyRenal disease     Musculoskeletal  (+) Arthritis , Fibromyalgia -, narcotic dependent  Abdominal   Peds  Hematology negative hematology ROS (+)   Anesthesia Other Findings Covid test negative See PAT note  Reproductive/Obstetrics                           Anesthesia Physical Anesthesia Plan  ASA: 3  Anesthesia Plan: General   Post-op Pain Management:    Induction: Intravenous  PONV Risk Score and Plan: 2 and Treatment may vary due to age or medical condition, Ondansetron, Dexamethasone and Midazolam  Airway Management  Planned: Oral ETT and Video Laryngoscope Planned  Additional Equipment: None  Intra-op Plan:   Post-operative Plan: Extubation in OR  Informed Consent: I have reviewed the patients History and Physical, chart, labs and discussed the procedure including the risks, benefits and alternatives for the proposed anesthesia with the patient or authorized representative who has indicated his/her understanding and acceptance.     Dental advisory given  Plan Discussed with: CRNA and Anesthesiologist  Anesthesia Plan Comments:       Anesthesia Quick Evaluation

## 2020-12-05 NOTE — Progress Notes (Signed)
Anesthesia Chart Review:   Case: [id number] Date/Time: 12/06/20 1232   Procedure: Cervical 2 Ganglionectomy with extension of fusion from Cervical 1 to Cervical 5 - 3C/RM 18   Anesthesia type: General   Pre-op diagnosis: Spondylosis of cervical region without myelopathy or radiculopathy   Location: [facility] OR ROOM 18 / [facility]   Surgeons: Judith Part, MD       DISCUSSION: Patient is a 55 year old male scheduled for the above procedure.  History includes former smoker (quit 12/16/17), NSTEMI 07/2012 (due to vasospasm, coronaries normal), HTN, hypercholesterolemia, fibromyalgia, pulmonary nodules (negative lingula biopsy 05/06/12; stable on 09/08/18 CT and felt to benign), GERD, esophageal stricture, neck surgery (left C5-6 foraminotomy 09/14/02; C5-7 ACDF 2/21/008; C5-7 posterior fusion 04/26/09, s/p I&D 05/10/09; C4-5 ACDF 10/25/09; removal of posterior cervical hardware, C4-5 posterior arthrodesis 05/09/11), "back" fracture (s/p surgery), post-operative urinary retention.   Recent cardiology and pulmonology evaluations as outlined below. Preoperative cardiology input as outlined by Kathyrn Drown, NP on 12/04/20, "Given recent stress test with no evidence of ischemia or infarct, STEWART SASAKI would be at acceptable risk from a CV standpoint for the planned procedure without further cardiovascular testing. I would suggest however, touching base with his pulmonary team to make sure they have no further recommendations prior to procedure.    The patient was advised that if he develops new symptoms prior to surgery to contact our office to arrange for a follow-up visit, and he verbalized understanding." ASA on hold for surgery. Advised to take Nexium and Breo Ellipta on the morning of surgery. I spoke with Janett Billow at Dr. Colleen Can office, and they are also reaching out to pulmonology to notify of surgery plans.   12/04/2020 presurgical COVID-19 test negative. Anesthesia team to evaluate on the day of  surgery.    VS: BP 111/83   Pulse 85   Temp 36.9 C (Oral)   Resp 17   Ht '5\' 8"'  (1.727 m)   Wt 78.1 kg   SpO2 99%   BMI 26.17 kg/m    PROVIDERS: Pcp, No  - Eleonore Chiquito, MD is cardiologist. Evaluation 11/15/20. (Previously evaluated by Buford Dresser, MD and Loralie Champagne, MD). Seen for DOE and atypical chest pain. Euvolemic on exam, and BNP normal. CRP and ESR normal (done to evaluate for pericarditis). Symptoms felt non-cardiac, but advised stress test to further define. Pulmonology follow-up recommended for possible COPD that may be contributing to SOB. Normal PFTs 2019. Short course of ibuprofen started for possible costochondritis. He had a low risk stress test 11/29/20.   Brand Males, MD is pulmonologist. Last evaluation with Rexene Edison, NP on 11/15/20 for SOB and chest tightness. Awaiting stress test then. Albuterol INH as needed, continue Nexium, and given trial of Breo 1 puff daily for possible mild intermittent asthma component contributing to symptoms. CXR showed no active disease. 4-6 week follow-up planned (next visit scheduled 12/13/20).    LABS: Labs reviewed: Acceptable for surgery. Cr 1.25, consistent with previous results since at least 05/2019.  (all labs ordered are listed, but only abnormal results are displayed)  Labs Reviewed  SURGICAL PCR SCREEN - Abnormal; Notable for the following components:      Result Value   Staphylococcus aureus POSITIVE (*)    All other components within normal limits  BASIC METABOLIC PANEL - Abnormal; Notable for the following components:   Glucose, Bld 117 (*)    Creatinine, Ser 1.25 (*)    All other components within normal limits  SARS CORONAVIRUS  2 (TAT 6-24 HRS)  CBC  TYPE AND SCREEN    PFTs 01/12/18: FVC 4.69 (100%), FEV1 3.77 (103%), DLCO cor 26.84 (90%).   IMAGES: CXR 11/15/20: FINDINGS: Lung volumes are normal. No consolidative airspace disease. No pleural effusions. No pneumothorax. No pulmonary  nodule or mass noted. Pulmonary vasculature and the cardiomediastinal silhouette are within normal limits. Orthopedic fixation hardware in the lower cervical spine incompletely imaged. IMPRESSION: No radiographic evidence of acute cardiopulmonary disease.  CTA Neck 10/27/20: IMPRESSION: 1. Normal CTA of the neck. No significant stenosis or occlusion. 2. Vertebral arteries do not enter the neural foramina at any level. 3. Cervical fusion C3-7 anteriorly and posteriorly.  CT Chest 09/08/18: IMPRESSION: Multiple stable small pulmonary nodules present bilaterally, benign sequelae of prior infection or inflammation. No routine CT follow-up is required for these nodules. Annual CT lung cancer screening may be considered if indicated by patient age, smoking history, and/or other risk factors for lung cancer.    EKG: 11/15/20: NSR, possible LAE   CV: Nuclear stress test 11/29/20: Nuclear stress EF: 57%. The left ventricular ejection fraction is normal (55-65%). There was no ST segment deviation noted during stress. No T wave inversion was noted during stress. There is a medium fixed defect in basal-to-apical inferior LV wall that is likely due to artifact due to significant extracardiac tracer uptake vs less likely infarct. Wall motion is normal. This is a low risk study.   Echo 07/05/16: Study Conclusions  - Left ventricle: Inferobasal hypokinesis. The cavity size was    normal. Systolic function was normal. The estimated ejection    fraction was in the range of 55% to 60%. Wall motion was normal;    there were no regional wall motion abnormalities. Left    ventricular diastolic function parameters were normal.  - Atrial septum: A patent foramen ovale cannot be excluded.    Cardiac cath 08/11/12 (in setting of NSTEMI) Final Conclusions:   1. Normal coronary anatomy. 2. Normal LV function.  Recommendations: treat empirically for spasm.   Past Medical History:  Diagnosis Date    Abnormal chest CT 03/2012   a. 2013: Scattered patchy ground glass opacities and pulmonary nodules. Transbronchial biopsy with no definite etiology of lung finding- possible organizing pneumonia. b. F/u CT 05/2014: stable multiple tiny pulm nodules, no further w/u recommended per notes.    Anxiety    Arthritis    CAD (coronary artery disease)    Chronic midline posterior neck pain    Chronic pain disorder    Complication of anesthesia    " difficult to urinate after "   Depression    Esophageal stricture    Fibromyalgia    Fracture    back   GERD (gastroesophageal reflux disease)    Hypercholesteremia    Hypertension    Migraine headache    "none lately; I've had alot in my life" (06/21/2015)   NSTEMI (non-ST elevated myocardial infarction) ([place]) 07/2012   a. Normal cath 03/2012. b. 07/2012: troponin 4, normal cors, ?vasospasm. Did not tolerate Imdur due to headache. On amlodipine.   Testosterone deficiency    Tobacco abuse     Past Surgical History:  Procedure Laterality Date   ANKLE FUSION Left 2006   BACK SURGERY     CERVICAL FUSION  X7 "last one 04/2011"   COLONOSCOPY     CYSTECTOMY     FRACTURE SURGERY     LEFT HEART CATHETERIZATION WITH CORONARY ANGIOGRAM N/A 03/25/2012   Procedure: LEFT HEART CATHETERIZATION  WITH CORONARY ANGIOGRAM;  Surgeon: Burnell Blanks, MD;  Location: [facility] CATH LAB;  Service: Cardiovascular;  Laterality: N/A;   LEFT HEART CATHETERIZATION WITH CORONARY ANGIOGRAM N/A 08/11/2012   Procedure: LEFT HEART CATHETERIZATION WITH CORONARY ANGIOGRAM;  Surgeon: Peter M Martinique, MD;  Location: [facility] CATH LAB;  Service: Cardiovascular;  Laterality: N/A;   POSTERIOR CERVICAL FUSION/FORAMINOTOMY  05/09/2011   Procedure: POSTERIOR CERVICAL FUSION/FORAMINOTOMY LEVEL 1;  Surgeon: Hosie Spangle;  Location: [facility];  Service: Neurosurgery;  Laterality: N/A;  C4/5 posterior arthrodesis with instrumentation    UPPER GASTROINTESTINAL ENDOSCOPY     VIDEO  BRONCHOSCOPY  05/06/2012   Procedure: VIDEO BRONCHOSCOPY WITH FLUORO;  Surgeon: Jyl Heinz, MD;  Location: [facility] ENDOSCOPY;  Service: Cardiopulmonary;  Laterality: Bilateral;    MEDICATIONS:  albuterol (VENTOLIN HFA) 108 (90 Base) MCG/ACT inhaler   amLODipine (NORVASC) 5 MG tablet   aspirin EC 81 MG EC tablet   Aspirin-Caffeine (BC FAST PAIN RELIEF ARTHRITIS) 1000-65 MG PACK   EPINEPHrine 0.3 mg/0.3 mL IJ SOAJ injection   esomeprazole (NEXIUM) 40 MG capsule   FLUoxetine (PROZAC) 40 MG capsule   fluticasone furoate-vilanterol (BREO ELLIPTA) 100-25 MCG/INH AEPB   fluticasone furoate-vilanterol (BREO ELLIPTA) 100-25 MCG/INH AEPB   lamoTRIgine (LAMICTAL) 150 MG tablet   nitroGLYCERIN (NITROSTAT) 0.4 MG SL tablet   oxyCODONE-acetaminophen (PERCOCET) 10-325 MG tablet   Propylene Glycol (SYSTANE BALANCE) 0.6 % SOLN   QUEtiapine (SEROQUEL) 50 MG tablet   No current facility-administered medications for this encounter.    Myra Gianotti, PA-C Surgical Short Stay/Anesthesiology [facility] Phone [phone] [facility] Phone [phone] 12/05/2020 2:05 PM

## 2020-12-06 ENCOUNTER — Inpatient Hospital Stay (HOSPITAL_COMMUNITY)
Admission: RE | Disposition: A | Discharge status: Home / Self Care | Payer: Self-pay | Source: Home / Self Care | Attending: Neurological Surgery

## 2020-12-06 ENCOUNTER — Inpatient Hospital Stay (HOSPITAL_COMMUNITY): Payer: Medicare Other | Admitting: Vascular Surgery

## 2020-12-06 ENCOUNTER — Inpatient Hospital Stay (HOSPITAL_COMMUNITY)
Admission: RE | Admit: 2020-12-06 | Discharge: 2020-12-08 | DRG: 473 | Disposition: A | Discharge status: Home / Self Care | Payer: Medicare Other | Attending: Neurological Surgery | Admitting: Neurological Surgery

## 2020-12-06 ENCOUNTER — Encounter (HOSPITAL_COMMUNITY): Payer: Self-pay | Admitting: Neurological Surgery

## 2020-12-06 ENCOUNTER — Inpatient Hospital Stay (HOSPITAL_COMMUNITY): Payer: Medicare Other

## 2020-12-06 ENCOUNTER — Other Ambulatory Visit: Payer: Self-pay

## 2020-12-06 DIAGNOSIS — K219 Gastro-esophageal reflux disease without esophagitis: Secondary | ICD-10-CM | POA: Diagnosis present

## 2020-12-06 DIAGNOSIS — I252 Old myocardial infarction: Secondary | ICD-10-CM | POA: Diagnosis not present

## 2020-12-06 DIAGNOSIS — Z8249 Family history of ischemic heart disease and other diseases of the circulatory system: Secondary | ICD-10-CM | POA: Diagnosis not present

## 2020-12-06 DIAGNOSIS — I10 Essential (primary) hypertension: Secondary | ICD-10-CM | POA: Diagnosis present

## 2020-12-06 DIAGNOSIS — M1990 Unspecified osteoarthritis, unspecified site: Secondary | ICD-10-CM | POA: Diagnosis present

## 2020-12-06 DIAGNOSIS — Z20822 Contact with and (suspected) exposure to covid-19: Secondary | ICD-10-CM | POA: Diagnosis present

## 2020-12-06 DIAGNOSIS — M47812 Spondylosis without myelopathy or radiculopathy, cervical region: Principal | ICD-10-CM | POA: Diagnosis present

## 2020-12-06 DIAGNOSIS — G43909 Migraine, unspecified, not intractable, without status migrainosus: Secondary | ICD-10-CM | POA: Diagnosis present

## 2020-12-06 DIAGNOSIS — G8929 Other chronic pain: Secondary | ICD-10-CM | POA: Diagnosis present

## 2020-12-06 DIAGNOSIS — Z87891 Personal history of nicotine dependence: Secondary | ICD-10-CM | POA: Diagnosis not present

## 2020-12-06 DIAGNOSIS — Z981 Arthrodesis status: Secondary | ICD-10-CM

## 2020-12-06 DIAGNOSIS — M797 Fibromyalgia: Secondary | ICD-10-CM | POA: Diagnosis present

## 2020-12-06 DIAGNOSIS — I2510 Atherosclerotic heart disease of native coronary artery without angina pectoris: Secondary | ICD-10-CM | POA: Diagnosis present

## 2020-12-06 DIAGNOSIS — Z83438 Family history of other disorder of lipoprotein metabolism and other lipidemia: Secondary | ICD-10-CM

## 2020-12-06 DIAGNOSIS — E291 Testicular hypofunction: Secondary | ICD-10-CM | POA: Diagnosis present

## 2020-12-06 DIAGNOSIS — Z419 Encounter for procedure for purposes other than remedying health state, unspecified: Secondary | ICD-10-CM

## 2020-12-06 DIAGNOSIS — M6748 Ganglion, other site: Secondary | ICD-10-CM | POA: Diagnosis present

## 2020-12-06 DIAGNOSIS — M542 Cervicalgia: Secondary | ICD-10-CM | POA: Diagnosis present

## 2020-12-06 DIAGNOSIS — M5481 Occipital neuralgia: Secondary | ICD-10-CM | POA: Diagnosis present

## 2020-12-06 DIAGNOSIS — E7800 Pure hypercholesterolemia, unspecified: Secondary | ICD-10-CM | POA: Diagnosis present

## 2020-12-06 HISTORY — PX: POSTERIOR CERVICAL FUSION/FORAMINOTOMY: SHX5038

## 2020-12-06 SURGERY — POSTERIOR CERVICAL FUSION/FORAMINOTOMY LEVEL 4
Anesthesia: General | Site: Neck

## 2020-12-06 MED ORDER — PHENOL 1.4 % MT LIQD: 1.0000 | OROMUCOSAL | Status: DC | PRN

## 2020-12-06 MED ORDER — 0.9 % SODIUM CHLORIDE (POUR BTL) OPTIME
TOPICAL | Status: DC | PRN
  Administered 2020-12-06: 1000 mL

## 2020-12-06 MED ORDER — ALBUMIN HUMAN 5 % IV SOLN: INTRAVENOUS | Status: DC | PRN

## 2020-12-06 MED ORDER — SUGAMMADEX SODIUM 200 MG/2ML IV SOLN
INTRAVENOUS | Status: DC | PRN
  Administered 2020-12-06: 200 mg via INTRAVENOUS

## 2020-12-06 MED ORDER — SODIUM CHLORIDE 0.9% FLUSH: 3.0000 mL | INTRAVENOUS | Status: DC | PRN

## 2020-12-06 MED ORDER — ROCURONIUM BROMIDE 10 MG/ML (PF) SYRINGE
PREFILLED_SYRINGE | INTRAVENOUS | Status: DC | PRN
  Administered 2020-12-06: 20 mg via INTRAVENOUS
  Administered 2020-12-06: 60 mg via INTRAVENOUS
  Administered 2020-12-06: 30 mg via INTRAVENOUS
  Administered 2020-12-06: 50 mg via INTRAVENOUS
  Administered 2020-12-06: 40 mg via INTRAVENOUS
  Administered 2020-12-06 (×2): 20 mg via INTRAVENOUS

## 2020-12-06 MED ORDER — EPHEDRINE SULFATE-NACL 50-0.9 MG/10ML-% IV SOSY
PREFILLED_SYRINGE | INTRAVENOUS | Status: DC | PRN
  Administered 2020-12-06: 5 mg via INTRAVENOUS

## 2020-12-06 MED ORDER — ONDANSETRON HCL 4 MG/2ML IJ SOLN
4.0000 mg | Freq: Four times a day (QID) | INTRAMUSCULAR | Status: DC | PRN
  Administered 2020-12-08: 4 mg via INTRAVENOUS
  Filled 2020-12-06: qty 2

## 2020-12-06 MED ORDER — ROCURONIUM BROMIDE 10 MG/ML (PF) SYRINGE
PREFILLED_SYRINGE | INTRAVENOUS | Status: AC
  Filled 2020-12-06: qty 10

## 2020-12-06 MED ORDER — THROMBIN 5000 UNITS EX SOLR
CUTANEOUS | Status: AC
  Filled 2020-12-06: qty 5000

## 2020-12-06 MED ORDER — POLYETHYLENE GLYCOL 3350 17 G PO PACK: 17.0000 g | PACK | Freq: Every day | ORAL | Status: DC | PRN

## 2020-12-06 MED ORDER — ACETAMINOPHEN 10 MG/ML IV SOLN
1000.0000 mg | Freq: Once | INTRAVENOUS | Status: AC
  Administered 2020-12-06: 1000 mg via INTRAVENOUS

## 2020-12-06 MED ORDER — LIDOCAINE HCL (PF) 2 % IJ SOLN
INTRAMUSCULAR | Status: DC | PRN
  Administered 2020-12-06: 80 mg via INTRADERMAL

## 2020-12-06 MED ORDER — LIDOCAINE-EPINEPHRINE 1 %-1:100000 IJ SOLN
INTRAMUSCULAR | Status: AC
  Filled 2020-12-06: qty 1

## 2020-12-06 MED ORDER — CHLORHEXIDINE GLUCONATE 0.12 % MT SOLN
15.0000 mL | Freq: Once | OROMUCOSAL | Status: AC
  Administered 2020-12-06: 15 mL via OROMUCOSAL
  Filled 2020-12-06: qty 15

## 2020-12-06 MED ORDER — BACITRACIN ZINC 500 UNIT/GM EX OINT
TOPICAL_OINTMENT | CUTANEOUS | Status: AC
  Filled 2020-12-06: qty 28.35

## 2020-12-06 MED ORDER — TAMSULOSIN HCL 0.4 MG PO CAPS
0.4000 mg | ORAL_CAPSULE | ORAL | Status: AC
  Administered 2020-12-06: 0.4 mg via ORAL

## 2020-12-06 MED ORDER — ONDANSETRON HCL 4 MG/2ML IJ SOLN
INTRAMUSCULAR | Status: DC | PRN
  Administered 2020-12-06 (×2): 4 mg via INTRAVENOUS

## 2020-12-06 MED ORDER — HYDROMORPHONE HCL 1 MG/ML IJ SOLN
0.2500 mg | INTRAMUSCULAR | Status: DC | PRN
Start: 2020-12-06 — End: 2020-12-06
  Administered 2020-12-06 (×2): 0.5 mg via INTRAVENOUS

## 2020-12-06 MED ORDER — ACETAMINOPHEN 500 MG PO TABS
ORAL_TABLET | ORAL | Status: AC
  Administered 2020-12-06: 500 mg via ORAL
  Filled 2020-12-06: qty 1

## 2020-12-06 MED ORDER — LIDOCAINE HCL (PF) 2 % IJ SOLN
INTRAMUSCULAR | Status: AC
  Filled 2020-12-06: qty 5

## 2020-12-06 MED ORDER — PROPYLENE GLYCOL 0.6 % OP SOLN: 1.0000 [drp] | Freq: Two times a day (BID) | OPHTHALMIC | Status: DC | PRN

## 2020-12-06 MED ORDER — BACITRACIN ZINC 500 UNIT/GM EX OINT
TOPICAL_OINTMENT | CUTANEOUS | Status: DC | PRN
  Administered 2020-12-06: 1 via TOPICAL

## 2020-12-06 MED ORDER — ONDANSETRON HCL 4 MG/2ML IJ SOLN
INTRAMUSCULAR | Status: AC
  Filled 2020-12-06: qty 2

## 2020-12-06 MED ORDER — MIDAZOLAM HCL 2 MG/2ML IJ SOLN
INTRAMUSCULAR | Status: AC
  Filled 2020-12-06: qty 2

## 2020-12-06 MED ORDER — PANTOPRAZOLE SODIUM 40 MG PO TBEC
40.0000 mg | DELAYED_RELEASE_TABLET | Freq: Every day | ORAL | Status: DC
  Administered 2020-12-06 – 2020-12-08 (×3): 40 mg via ORAL
  Filled 2020-12-06 (×3): qty 1

## 2020-12-06 MED ORDER — FENTANYL CITRATE (PF) 250 MCG/5ML IJ SOLN
INTRAMUSCULAR | Status: DC | PRN
  Administered 2020-12-06 (×3): 50 ug via INTRAVENOUS
  Administered 2020-12-06: 100 ug via INTRAVENOUS

## 2020-12-06 MED ORDER — FLUTICASONE FUROATE-VILANTEROL 100-25 MCG/INH IN AEPB
1.0000 | INHALATION_SPRAY | Freq: Every day | RESPIRATORY_TRACT | Status: DC
  Filled 2020-12-06: qty 28

## 2020-12-06 MED ORDER — OXYCODONE HCL 5 MG/5ML PO SOLN: 5.0000 mg | Freq: Once | ORAL | Status: DC | PRN

## 2020-12-06 MED ORDER — QUETIAPINE FUMARATE 50 MG PO TABS
50.0000 mg | ORAL_TABLET | Freq: Every evening | ORAL | Status: DC | PRN
  Filled 2020-12-06: qty 1

## 2020-12-06 MED ORDER — SODIUM CHLORIDE 0.9 % IV SOLN: 250.0000 mL | INTRAVENOUS | Status: DC

## 2020-12-06 MED ORDER — LAMOTRIGINE 150 MG PO TABS
300.0000 mg | ORAL_TABLET | Freq: Every day | ORAL | Status: DC
  Administered 2020-12-06 – 2020-12-07 (×2): 300 mg via ORAL
  Filled 2020-12-06 (×4): qty 2

## 2020-12-06 MED ORDER — ACETAMINOPHEN 325 MG PO TABS
650.0000 mg | ORAL_TABLET | ORAL | Status: DC | PRN
  Administered 2020-12-07 – 2020-12-08 (×5): 650 mg via ORAL
  Filled 2020-12-06 (×5): qty 2

## 2020-12-06 MED ORDER — ACETAMINOPHEN 10 MG/ML IV SOLN
INTRAVENOUS | Status: AC
  Filled 2020-12-06: qty 100

## 2020-12-06 MED ORDER — THROMBIN 5000 UNITS EX SOLR
CUTANEOUS | Status: AC
  Filled 2020-12-06: qty 10000

## 2020-12-06 MED ORDER — LACTATED RINGERS IV SOLN: INTRAVENOUS | Status: DC

## 2020-12-06 MED ORDER — FENTANYL CITRATE (PF) 250 MCG/5ML IJ SOLN
INTRAMUSCULAR | Status: AC
  Filled 2020-12-06: qty 5

## 2020-12-06 MED ORDER — PROPOFOL 10 MG/ML IV BOLUS
INTRAVENOUS | Status: AC
  Filled 2020-12-06: qty 20

## 2020-12-06 MED ORDER — FENTANYL CITRATE (PF) 100 MCG/2ML IJ SOLN
INTRAMUSCULAR | Status: AC
  Filled 2020-12-06: qty 2

## 2020-12-06 MED ORDER — FLUOXETINE HCL 20 MG PO CAPS
40.0000 mg | ORAL_CAPSULE | Freq: Every day | ORAL | Status: DC
  Administered 2020-12-06 – 2020-12-07 (×2): 40 mg via ORAL
  Filled 2020-12-06 (×2): qty 2

## 2020-12-06 MED ORDER — PROPOFOL 10 MG/ML IV BOLUS
INTRAVENOUS | Status: DC | PRN
  Administered 2020-12-06: 50 mg via INTRAVENOUS
  Administered 2020-12-06: 200 mg via INTRAVENOUS

## 2020-12-06 MED ORDER — NITROGLYCERIN 0.4 MG SL SUBL: 0.4000 mg | SUBLINGUAL_TABLET | SUBLINGUAL | Status: DC | PRN

## 2020-12-06 MED ORDER — CHLORHEXIDINE GLUCONATE CLOTH 2 % EX PADS: 6.0000 | MEDICATED_PAD | Freq: Once | CUTANEOUS | Status: DC

## 2020-12-06 MED ORDER — TAMSULOSIN HCL 0.4 MG PO CAPS
0.4000 mg | ORAL_CAPSULE | Freq: Every day | ORAL | Status: DC
  Administered 2020-12-07: 0.4 mg via ORAL
  Filled 2020-12-06 (×2): qty 1

## 2020-12-06 MED ORDER — CYCLOBENZAPRINE HCL 10 MG PO TABS
10.0000 mg | ORAL_TABLET | Freq: Three times a day (TID) | ORAL | Status: DC | PRN
  Administered 2020-12-06 – 2020-12-08 (×4): 10 mg via ORAL
  Filled 2020-12-06 (×4): qty 1

## 2020-12-06 MED ORDER — ACETAMINOPHEN 650 MG RE SUPP: 650.0000 mg | RECTAL | Status: DC | PRN

## 2020-12-06 MED ORDER — OXYCODONE HCL 5 MG PO TABS: 5.0000 mg | ORAL_TABLET | Freq: Once | ORAL | Status: DC | PRN

## 2020-12-06 MED ORDER — MENTHOL 3 MG MT LOZG: 1.0000 | LOZENGE | OROMUCOSAL | Status: DC | PRN

## 2020-12-06 MED ORDER — DEXAMETHASONE SODIUM PHOSPHATE 10 MG/ML IJ SOLN
INTRAMUSCULAR | Status: AC
  Filled 2020-12-06: qty 1

## 2020-12-06 MED ORDER — PROMETHAZINE HCL 25 MG/ML IJ SOLN: 6.2500 mg | INTRAMUSCULAR | Status: DC | PRN

## 2020-12-06 MED ORDER — PHENYLEPHRINE 40 MCG/ML (10ML) SYRINGE FOR IV PUSH (FOR BLOOD PRESSURE SUPPORT)
PREFILLED_SYRINGE | INTRAVENOUS | Status: DC | PRN
  Administered 2020-12-06: 120 ug via INTRAVENOUS

## 2020-12-06 MED ORDER — ALBUTEROL SULFATE HFA 108 (90 BASE) MCG/ACT IN AERS
1.0000 | INHALATION_SPRAY | Freq: Four times a day (QID) | RESPIRATORY_TRACT | Status: DC | PRN
Start: 2020-12-06 — End: 2020-12-06

## 2020-12-06 MED ORDER — ONDANSETRON HCL 4 MG PO TABS: 4.0000 mg | ORAL_TABLET | Freq: Four times a day (QID) | ORAL | Status: DC | PRN

## 2020-12-06 MED ORDER — AMLODIPINE BESYLATE 5 MG PO TABS
5.0000 mg | ORAL_TABLET | Freq: Every day | ORAL | Status: DC
  Administered 2020-12-06: 5 mg via ORAL
  Filled 2020-12-06 (×2): qty 1

## 2020-12-06 MED ORDER — HYDROMORPHONE HCL 1 MG/ML IJ SOLN
INTRAMUSCULAR | Status: AC
  Filled 2020-12-06: qty 1

## 2020-12-06 MED ORDER — ACETAMINOPHEN 500 MG PO TABS: 500.0000 mg | ORAL_TABLET | Freq: Once | ORAL | Status: AC

## 2020-12-06 MED ORDER — THROMBIN 5000 UNITS EX SOLR
OROMUCOSAL | Status: DC | PRN
  Administered 2020-12-06 (×2): 5 mL

## 2020-12-06 MED ORDER — DEXAMETHASONE SODIUM PHOSPHATE 10 MG/ML IJ SOLN
INTRAMUSCULAR | Status: DC | PRN
  Administered 2020-12-06: 10 mg via INTRAVENOUS

## 2020-12-06 MED ORDER — OXYCODONE HCL 5 MG PO TABS
15.0000 mg | ORAL_TABLET | ORAL | Status: DC | PRN
Start: 2020-12-06 — End: 2020-12-08
  Administered 2020-12-06 – 2020-12-08 (×10): 15 mg via ORAL
  Filled 2020-12-06 (×10): qty 3

## 2020-12-06 MED ORDER — CEFAZOLIN SODIUM-DEXTROSE 2-4 GM/100ML-% IV SOLN
2.0000 g | INTRAVENOUS | Status: AC
  Administered 2020-12-06 (×2): 2 g via INTRAVENOUS
  Filled 2020-12-06: qty 100

## 2020-12-06 MED ORDER — ORAL CARE MOUTH RINSE: 15.0000 mL | Freq: Once | OROMUCOSAL | Status: AC

## 2020-12-06 MED ORDER — FENTANYL CITRATE (PF) 100 MCG/2ML IJ SOLN
25.0000 ug | INTRAMUSCULAR | Status: DC | PRN
  Administered 2020-12-06 (×4): 50 ug via INTRAVENOUS

## 2020-12-06 MED ORDER — DOCUSATE SODIUM 100 MG PO CAPS
100.0000 mg | ORAL_CAPSULE | Freq: Two times a day (BID) | ORAL | Status: DC
  Administered 2020-12-06 – 2020-12-08 (×4): 100 mg via ORAL
  Filled 2020-12-06 (×4): qty 1

## 2020-12-06 MED ORDER — LACTATED RINGERS IV SOLN: INTRAVENOUS | Status: DC | PRN

## 2020-12-06 MED ORDER — PHENYLEPHRINE HCL-NACL 10-0.9 MG/250ML-% IV SOLN
INTRAVENOUS | Status: DC | PRN
  Administered 2020-12-06: 30 ug/min via INTRAVENOUS

## 2020-12-06 MED ORDER — OXYCODONE HCL 5 MG PO TABS: 10.0000 mg | ORAL_TABLET | ORAL | Status: DC | PRN

## 2020-12-06 MED ORDER — LIDOCAINE-EPINEPHRINE 1 %-1:100000 IJ SOLN
INTRAMUSCULAR | Status: DC | PRN
  Administered 2020-12-06: 10 mL

## 2020-12-06 MED ORDER — HYDROMORPHONE HCL 1 MG/ML IJ SOLN
1.0000 mg | INTRAMUSCULAR | Status: DC | PRN
  Administered 2020-12-06 – 2020-12-08 (×5): 1 mg via INTRAVENOUS
  Filled 2020-12-06 (×5): qty 1

## 2020-12-06 MED ORDER — SODIUM CHLORIDE 0.9% FLUSH
3.0000 mL | Freq: Two times a day (BID) | INTRAVENOUS | Status: DC
  Administered 2020-12-07 – 2020-12-08 (×2): 3 mL via INTRAVENOUS

## 2020-12-06 MED ORDER — MIDAZOLAM HCL 2 MG/2ML IJ SOLN
INTRAMUSCULAR | Status: DC | PRN
  Administered 2020-12-06: 2 mg via INTRAVENOUS

## 2020-12-06 MED ORDER — CEFAZOLIN SODIUM 1 G IJ SOLR
INTRAMUSCULAR | Status: AC
  Filled 2020-12-06: qty 20

## 2020-12-06 MED ORDER — CEFAZOLIN SODIUM-DEXTROSE 2-4 GM/100ML-% IV SOLN
2.0000 g | Freq: Three times a day (TID) | INTRAVENOUS | Status: AC
  Administered 2020-12-07 (×2): 2 g via INTRAVENOUS
  Filled 2020-12-06 (×2): qty 100

## 2020-12-06 SURGICAL SUPPLY — 66 items
ADH SKN CLS APL DERMABOND .7 (GAUZE/BANDAGES/DRESSINGS) ×1
APL SKNCLS STERI-STRIP NONHPOA (GAUZE/BANDAGES/DRESSINGS)
BENZOIN TINCTURE PRP APPL 2/3 (GAUZE/BANDAGES/DRESSINGS) IMPLANT
BIT DRILL 2.4 (BIT) ×1 IMPLANT
BIT DRILL 2.4MM (BIT) ×1
BLADE CLIPPER SURG (BLADE) ×3 IMPLANT
BONE CANC CHIPS 20CC PCAN1/4 (Bone Implant) ×3 IMPLANT
BUR MATCHSTICK NEURO 3.0 LAGG (BURR) IMPLANT
BUR PRECISION FLUTE 5.0 (BURR) ×3 IMPLANT
CANISTER SUCT 3000ML PPV (MISCELLANEOUS) ×3 IMPLANT
CHIPS CANC BONE 20CC PCAN1/4 (Bone Implant) ×1 IMPLANT
COVER WAND RF STERILE (DRAPES) ×1 IMPLANT
DECANTER SPIKE VIAL GLASS SM (MISCELLANEOUS) ×1 IMPLANT
DERMABOND ADVANCED (GAUZE/BANDAGES/DRESSINGS) ×2
DERMABOND ADVANCED .7 DNX12 (GAUZE/BANDAGES/DRESSINGS) ×1 IMPLANT
DRAPE C-ARM 42X72 X-RAY (DRAPES) ×6 IMPLANT
DRAPE LAPAROTOMY 100X72 PEDS (DRAPES) ×3 IMPLANT
DURAPREP 6ML APPLICATOR 50/CS (WOUND CARE) ×3 IMPLANT
ELECT REM PT RETURN 9FT ADLT (ELECTROSURGICAL) ×3
ELECTRODE REM PT RTRN 9FT ADLT (ELECTROSURGICAL) ×1 IMPLANT
GAUZE 4X4 16PLY RFD (DISPOSABLE) IMPLANT
GAUZE SPONGE 4X4 12PLY STRL (GAUZE/BANDAGES/DRESSINGS) IMPLANT
GLOVE BIOGEL PI IND STRL 7.5 (GLOVE) ×1 IMPLANT
GLOVE BIOGEL PI INDICATOR 7.5 (GLOVE) ×2
GLOVE ECLIPSE 7.5 STRL STRAW (GLOVE) ×3 IMPLANT
GLOVE EXAM NITRILE LRG STRL (GLOVE) IMPLANT
GLOVE EXAM NITRILE XL STR (GLOVE) IMPLANT
GLOVE EXAM NITRILE XS STR PU (GLOVE) IMPLANT
GOWN STRL REUS W/ TWL LRG LVL3 (GOWN DISPOSABLE) IMPLANT
GOWN STRL REUS W/ TWL XL LVL3 (GOWN DISPOSABLE) ×1 IMPLANT
GOWN STRL REUS W/TWL 2XL LVL3 (GOWN DISPOSABLE) IMPLANT
GOWN STRL REUS W/TWL LRG LVL3 (GOWN DISPOSABLE)
GOWN STRL REUS W/TWL XL LVL3 (GOWN DISPOSABLE) ×3
GRAFT BN 5X1XSPNE CVD POST DBM (Bone Implant) IMPLANT
GRAFT BNE CANC CHIPS 1-8 20CC (Bone Implant) IMPLANT
GRAFT BONE MAGNIFUSE 1X5CM (Bone Implant) ×3 IMPLANT
HEMOSTAT POWDER KIT SURGIFOAM (HEMOSTASIS) ×5 IMPLANT
KIT BASIN OR (CUSTOM PROCEDURE TRAY) ×3 IMPLANT
KIT TURNOVER KIT B (KITS) ×3 IMPLANT
NDL SPNL 22GX3.5 QUINCKE BK (NEEDLE) IMPLANT
NEEDLE HYPO 22GX1.5 SAFETY (NEEDLE) ×3 IMPLANT
NEEDLE SPNL 22GX3.5 QUINCKE BK (NEEDLE) ×9 IMPLANT
NS IRRIG 1000ML POUR BTL (IV SOLUTION) ×3 IMPLANT
PACK LAMINECTOMY NEURO (CUSTOM PROCEDURE TRAY) ×3 IMPLANT
PAD ARMBOARD 7.5X6 YLW CONV (MISCELLANEOUS) ×9 IMPLANT
PIN MAYFIELD SKULL DISP (PIN) ×3 IMPLANT
ROD PRECUT 3.5X60 (Rod) ×2 IMPLANT
ROD SPINAL PRE CUT 3.5X70 (Rod) ×2 IMPLANT
SCREW MA INFINITY 3.5X12 (Screw) ×4 IMPLANT
SCREW MULT AX PT 28X3.5XMA NS (Screw) IMPLANT
SCREW MULTI AXIAL 3.5X22 (Screw) ×4 IMPLANT
SCREW MULTI AXIAL 3.5X28 (Screw) ×6 IMPLANT
SET SCREW INFINITY IFIX THOR (Screw) ×12 IMPLANT
SPONGE LAP 4X18 RFD (DISPOSABLE) IMPLANT
STAPLER VISISTAT 35W (STAPLE) ×3 IMPLANT
SUT ETHILON 3 0 FSL (SUTURE) IMPLANT
SUT MNCRL AB 3-0 PS2 18 (SUTURE) ×3 IMPLANT
SUT MON AB 3-0 SH 27 (SUTURE) ×3
SUT MON AB 3-0 SH27 (SUTURE) ×1 IMPLANT
SUT VIC AB 0 CT1 18XCR BRD8 (SUTURE) ×1 IMPLANT
SUT VIC AB 0 CT1 8-18 (SUTURE) ×6
SUT VIC AB 2-0 CP2 18 (SUTURE) ×5 IMPLANT
TOWEL GREEN STERILE (TOWEL DISPOSABLE) ×3 IMPLANT
TOWEL GREEN STERILE FF (TOWEL DISPOSABLE) ×3 IMPLANT
UNDERPAD 30X36 HEAVY ABSORB (UNDERPADS AND DIAPERS) ×3 IMPLANT
WATER STERILE IRR 1000ML POUR (IV SOLUTION) ×3 IMPLANT

## 2020-12-06 NOTE — Op Note (Addendum)
PATIENT: Richard Franco  DAY OF SURGERY: 12/06/20   PRE-OPERATIVE DIAGNOSIS:  Occipital neuralgia, cervical spondylosis   POST-OPERATIVE DIAGNOSIS:  Same   PROCEDURE:  Bilateral C2 ganglionectomies, extension of posterior cervical fusion from C1 to C5 with decortication and placing bone graft, use of intraop CT scan with frameless stereotaxy   SURGEON:  Surgeon(s) and Role:    Judith Part, MD - Primary    Ashok Pall, MD - Assisting   ANESTHESIA: ETGA   BRIEF HISTORY: This is a 55 year old man who presented with multiple prior cervical surgeries with multiple prior pseudoarthroses and worsening neck pain with axial rotation and severe left greater than right refractory occipital neuralgia. I therefore recommended ganglionectomies and extension of his fusion to C1. This was discussed with the patient as well as risks, benefits, and alternatives and wished to proceed with surgery.   OPERATIVE DETAIL: The patient was taken to the operating room, anesthesia was induced by the anesthesia staff, the [place] head holder was applied and the patient was carefully placed prone on the OR table. All pressure points were padded.  A formal time out was performed with two patient identifiers and confirmed the operative site. The operative site was marked, hair was clipped with surgical clippers, the area was then prepped and draped in a sterile fashion.   The patient's prior posterior cervical incision was reopened in the midline of the cervical spine to allow for exposure from C1 to C5 after subperiosteal dissection. The prior hardware was identified and dissected free. The ganglionectomy was performed first. This was performed by dissecting free the C2 nerve roots bilaterally from the plexus, ligating and cutting them proximally and distally, and then resecting the ganglion en bloc.   Attention was then turned to instrumentation. The reference array was attached to the C2 spinous process. The  O-arm was brought into the room and a CT was performed, which was then co-registered to the patient's anatomy on the Stealth workstation. Landmarks were checked with good fit.   Bilateral C1 lateral mass screws were placed using navigation by using a navigated drill and tap, palpating for breaches, and placing screws bilaterally. At C2, I used frameless navigation to place bilateral C2 pedicle screws in the same fashion, followed by C3 lateral mass screws bilaterally in the same fashion.   The caps were removed from the C4 and C5 lateral mass screws, those rods were removed, and new rods were fashioned to connect the C1 screws down to C5 bilaterally. This proved difficult given the proximity of the C1 and C2 screw heads. I asked Dr. Christella Noa to scrub in and assist with retracting them apart so a reducing instrument would fit over the screw head and allow reduction of the rod into the caps. While placing the left C3 cap, it slipped laterally and I brought fluoroscopy into the field to locate and remove it.  The other caps were then placed and final tightened to manufacturer specifications. Final fluoroscopic images were taken with the C-arm which showed hardware in good position.  All instrument and sponge counts were correct, hemostasis was obtained and confirmed, the incision was then closed in layers. The patient was then returned to anesthesia for emergence. No apparent complications at the completion of the procedure.   EBL:  789mL   DRAINS: none   SPECIMENS: none   Judith Part, MD 12/06/20 1:01 PM

## 2020-12-06 NOTE — Transfer of Care (Signed)
Immediate Anesthesia Transfer of Care Note  Patient: Richard Franco  Procedure(s) Performed: Cervical two Ganglionectomy with extension of fusion from Cervical one to Cervical five (Neck)  Patient Location: PACU  Anesthesia Type:General  Level of Consciousness: awake, alert  and oriented  Airway & Oxygen Therapy: Patient Spontanous Breathing  Post-op Assessment: Report given to RN and Patient moving all extremities X 4  Post vital signs: Reviewed and stable  Last Vitals:  Vitals Value Taken Time  BP 114/70 12/06/20 1850  Temp    Pulse 103 12/06/20 1851  Resp 30 12/06/20 1851  SpO2 100 % 12/06/20 1851  Vitals shown include unvalidated device data.  Last Pain:  Vitals:   12/06/20 1121  TempSrc:   PainSc: 7          Complications: No notable events documented.

## 2020-12-06 NOTE — H&P (Signed)
Surgical H&P Update  HPI: 55 y.o. man with history of prior C3-4 ACDF, posterior cervical foraminotomy, C5-6 and C6-7 ACDFs w/ pseudo then C5-7 PSIF w/ post-op wound infection, then C4-5 ACDF w/ pseudo requiring extension of posterior hardware to C4-5, now presented with worsening axial neck pain and left occipital neuralgia. Imaging workup showed the above post-op changes with a large osteophyte contacting the C2 ganglion with severe degenerative changes. No changes in health since he was last seen. Still having both axial and occipital distribution pain and wishes to proceed with surgery.  PMHx:  Past Medical History:  Diagnosis Date   Abnormal chest CT 03/2012   a. 2013: Scattered patchy ground glass opacities and pulmonary nodules. Transbronchial biopsy with no definite etiology of lung finding- possible organizing pneumonia. b. F/u CT 05/2014: stable multiple tiny pulm nodules, no further w/u recommended per notes.    Anxiety    Arthritis    CAD (coronary artery disease)    Chronic midline posterior neck pain    Chronic pain disorder    Complication of anesthesia    " difficult to urinate after "   Depression    Esophageal stricture    Fibromyalgia    Fracture    back   GERD (gastroesophageal reflux disease)    Hypercholesteremia    Hypertension    Migraine headache    "none lately; I've had alot in my life" (06/21/2015)   NSTEMI (non-ST elevated myocardial infarction) ([place]) 07/2012   a. Normal cath 03/2012. b. 07/2012: troponin 4, normal cors, ?vasospasm. Did not tolerate Imdur due to headache. On amlodipine.   Testosterone deficiency    Tobacco abuse    FamHx:  Family History  Problem Relation Age of Onset   Diabetes Mother    Cancer Mother        ?   Hypertension Mother    Hyperlipidemia Father    Diabetes Father    Coronary artery disease Paternal Uncle    Asthma Sister    Diabetes Brother    Hypertension Brother    Colon cancer Neg Hx    Colon polyps Neg Hx     Rectal cancer Neg Hx    Stomach cancer Neg Hx    SocHx:  reports that he quit smoking about 2 years ago. His smoking use included cigarettes. He has a 30.00 pack-year smoking history. He has quit using smokeless tobacco.  His smokeless tobacco use included chew. He reports previous alcohol use of about 21.0 standard drinks of alcohol per week. He reports that he does not use drugs.  Physical Exam: Strength 5/5 x4, SILTx4  Assesment/Plan: 55 y.o. man with refractory occipital neuralgia and neck pain, here for C2 ganglionectomy and extension of hardware up to C1. Risks, benefits, and alternatives discussed and the patient would like to continue with surgery.  -OR today -will start some flomax post op to help with his h/o urinary retention after anesthesia -3C post-op  Judith Part, MD 12/06/20 8:33 AM

## 2020-12-06 NOTE — Anesthesia Postprocedure Evaluation (Signed)
Anesthesia Post Note  Patient: Richard Franco  Procedure(s) Performed: Cervical two Ganglionectomy with extension of fusion from Cervical one to Cervical five (Neck)     Patient location during evaluation: PACU Anesthesia Type: General Level of consciousness: awake and alert Pain management: pain level controlled Vital Signs Assessment: post-procedure vital signs reviewed and stable Respiratory status: spontaneous breathing, nonlabored ventilation, respiratory function stable and patient connected to nasal cannula oxygen Cardiovascular status: blood pressure returned to baseline and stable Postop Assessment: no apparent nausea or vomiting Anesthetic complications: no   No notable events documented.  Last Vitals:  Vitals:   12/06/20 2020 12/06/20 2051  BP: 105/72 122/80  Pulse: 93 86  Resp: (!) 91 14  Temp: 36.8 C (!) 36.4 C  SpO2: 98% 99%    Last Pain:  Vitals:   12/06/20 2106  TempSrc:   PainSc: 10-Worst pain ever    LLE Motor Response: Purposeful movement (12/06/20 2107) LLE Sensation: Full sensation (12/06/20 2107) RLE Motor Response: Purposeful movement (12/06/20 2107) RLE Sensation: Full sensation (12/06/20 2107)      Belenda Cruise P [name]

## 2020-12-06 NOTE — Brief Op Note (Signed)
12/06/2020  6:44 PM  PATIENT:  Richard Franco  55 y.o. male  PRE-OPERATIVE DIAGNOSIS:  Spondylosis of cervical region without myelopathy or radiculopathy  POST-OPERATIVE DIAGNOSIS:  Spondylosis of cervical region without myelopathy or radiculopathy  PROCEDURE:  Procedure(s): Cervical two Ganglionectomy with extension of fusion from Cervical one to Cervical five (N/A)  SURGEON:  Surgeon(s) and Role:    * Judith Part, MD - Primary    * Ashok Pall, MD - Assisting  PHYSICIAN ASSISTANT:   ANESTHESIA:   general  EBL:  1500 mL   BLOOD ADMINISTERED:none  DRAINS: none   LOCAL MEDICATIONS USED:  LIDOCAINE   SPECIMEN:  No Specimen  DISPOSITION OF SPECIMEN:  N/A  COUNTS:  YES  TOURNIQUET:  * No tourniquets in log *  DICTATION: .Note written in EPIC  PLAN OF CARE: Admit to inpatient   PATIENT DISPOSITION:  PACU - hemodynamically stable.   Delay start of Pharmacological VTE agent (>24hrs) due to surgical blood loss or risk of bleeding: yes

## 2020-12-06 NOTE — Anesthesia Procedure Notes (Signed)
Procedure Name: Intubation Date/Time: 12/06/2020 1:24 PM Performed by: Daiva Huge, RN Pre-anesthesia Checklist: Patient identified, Emergency Drugs available, Suction available and Patient being monitored Patient Re-evaluated:Patient Re-evaluated prior to induction Oxygen Delivery Method: Circle system utilized Preoxygenation: Pre-oxygenation with 100% oxygen Induction Type: IV induction Ventilation: Mask ventilation without difficulty Laryngoscope Size: Glidescope and 4 Grade View: Grade I Tube type: Oral Tube size: 7.5 mm Number of attempts: 1 Airway Equipment and Method: Stylet Placement Confirmation: ETT inserted through vocal cords under direct vision, positive ETCO2 and breath sounds checked- equal and bilateral Secured at: 21 cm Tube secured with: Tape Dental Injury: Teeth and Oropharynx as per pre-operative assessment

## 2020-12-07 ENCOUNTER — Encounter (HOSPITAL_COMMUNITY): Payer: Self-pay | Admitting: Neurological Surgery

## 2020-12-07 LAB — CBC
HCT: 26.9 % — ABNORMAL LOW (ref 39.0–52.0)
Hemoglobin: 9 g/dL — ABNORMAL LOW (ref 13.0–17.0)
MCH: 30.6 pg (ref 26.0–34.0)
MCHC: 33.5 g/dL (ref 30.0–36.0)
MCV: 91.5 fL (ref 80.0–100.0)
Platelets: 170 10*3/uL (ref 150–400)
RBC: 2.94 MIL/uL — ABNORMAL LOW (ref 4.22–5.81)
RDW: 13.2 % (ref 11.5–15.5)
WBC: 9.6 10*3/uL (ref 4.0–10.5)
nRBC: 0 % (ref 0.0–0.2)

## 2020-12-07 MED ORDER — KETOROLAC TROMETHAMINE 30 MG/ML IJ SOLN
30.0000 mg | Freq: Four times a day (QID) | INTRAMUSCULAR | Status: AC
  Administered 2020-12-07 – 2020-12-08 (×5): 30 mg via INTRAVENOUS
  Filled 2020-12-07 (×5): qty 1

## 2020-12-07 MED FILL — Thrombin For Soln 5000 Unit: CUTANEOUS | Qty: 5000 | Status: AC

## 2020-12-07 NOTE — Plan of Care (Signed)
Adequately Ready for Discharge 

## 2020-12-07 NOTE — Progress Notes (Signed)
Neurosurgery Service Progress Note  Subjective: No acute events overnight, neck pain as expected, greater occipital distribution / headaches gone, he's very happy  Objective: Vitals:   12/06/20 2051 12/06/20 2314 12/07/20 0337 12/07/20 0651  BP: 122/80 111/61 96/60 110/69  Pulse: 86 95 87 99  Resp: 14 18 18 20   Temp: (!) 97.5 F (36.4 C) 98.5 F (36.9 C) 98.2 F (36.8 C) 97.8 F (36.6 C)  TempSrc:  Oral Oral Oral  SpO2: 99% 94% 93% 97%  Weight:      Height:        Physical Exam: Strength 5/5 x4, SILTx4 except bilateral gr occipital numbness Incision c/d/i  Assessment & Plan: 55 y.o. man s/p b/l C2 ganglionectomies, extension of hardware C1-C5, recovering well.  -d/c foley, if voiding and pain controlled, can go home later today  Judith Part  12/07/20 8:05 AM

## 2020-12-07 NOTE — Evaluation (Signed)
Physical Therapy Evaluation Patient Details Name: Richard Franco MRN: [id number] DOB: [birth year] Today's Date: 12/07/2020   History of Present Illness  55 yo male s/p bil C2 ganglionectomies, extension of posterior cervical fusion C1-C5 on 6/15 for worsening axial neck pain and occipital neuralgia. PMH including prior C3-4 ACDF, posterior cervical foraminotomy, C5-6 and C6-7 ACDFs w/ pseudo then C5-7 PSIF w/ post-op wound infection, C4-5 ACDF w/ pseudo requiring extension of posterior hardware to C4-5, chronic pain, anxiety, OA, CAD, depression, fibromyalgia, GERD, HTN, and tobacco abuse.  Clinical Impression  Pt presents with post-operative neck pain, but otherwise demonstrates [facility] strength, gait, knowledge of spinal precautions, and activity tolerance. Pt ambulated great hallway distance with mod I to supervision level, no physical assist needed at any point during eval. PT administered cervical handout to review pt rules, pt with no questions. Pt to d/c home with assist of girlfriend, no further acute PT needs. Will sign off, thank you.      Follow Up Recommendations No PT follow up    Equipment Recommendations  None recommended by PT    Recommendations for Other Services       Precautions / Restrictions Precautions Precautions: Cervical Precaution Booklet Issued: Yes (comment) Precaution Comments: Reviewed cervical precautions and compensatory techniques Required Braces or Orthoses: Other Brace Other Brace: No brace per order Restrictions Weight Bearing Restrictions: No      Mobility  Bed Mobility Overal bed mobility: Modified Independent             General bed mobility comments: Verbal cuing for log roll technique    Transfers Overall transfer level: Modified independent Equipment used: None                Ambulation/Gait Ambulation/Gait assistance: Supervision Gait Distance (Feet): 400 Feet Assistive device: None Gait Pattern/deviations:  Step-through pattern;WFL(Within Functional Limits) Gait velocity: WFL   General Gait Details: WFL gait, good speed. Cues to keep head still to avoid cervical rotation as well as upright  Stairs Stairs: Yes Stairs assistance: Modified independent (Device/Increase time) Stair Management: One rail Right;Forwards;Alternating pattern Number of Stairs: 10 General stair comments: mod I for increased time, cues to avoid cervical flexion  Wheelchair Mobility    Modified Rankin (Stroke Patients Only)       Balance Overall balance assessment: Modified Independent                                           Pertinent Vitals/Pain Pain Assessment: 0-10 Pain Score: 6  Faces Pain Scale: Hurts a little bit Pain Location: Neck Pain Descriptors / Indicators: Discomfort;Sore Pain Intervention(s): Limited activity within patient's tolerance;Monitored during session;Repositioned    Home Living Family/patient expects to be discharged to:: Private residence Living Arrangements: Spouse/significant other Available Help at Discharge: Family;Available 24 hours/day Type of Home: House Home Access: Stairs to enter Entrance Stairs-Rails: Right Entrance Stairs-Number of Steps: 3 Home Layout: One level Home Equipment: Cane - single point      Prior Function Level of Independence: Independent         Comments: ADLs, IADLs, drives, and works as a Human resources officer        Extremity/Trunk Assessment   Upper Extremity Assessment Upper Extremity Assessment: Defer to OT evaluation    Lower Extremity Assessment Lower Extremity Assessment: Overall WFL for tasks assessed    Cervical / Trunk Assessment Cervical /  Trunk Assessment: Other exceptions Cervical / Trunk Exceptions: s/p cervical sx  Communication   Communication: No difficulties  Cognition Arousal/Alertness: Awake/alert Behavior During Therapy: WFL for tasks assessed/performed Overall Cognitive  Status: Within Functional Limits for tasks assessed                                        General Comments      Exercises     Assessment/Plan    PT Assessment Patent does not need any further PT services  PT Problem List         PT Treatment Interventions      PT Goals (Current goals can be found in the Care Plan section)  Acute Rehab PT Goals Patient Stated Goal: Go home PT Goal Formulation: With patient Time For Goal Achievement: 12/07/20 Potential to Achieve Goals: Good    Frequency     Barriers to discharge        Co-evaluation               AM-PAC PT "6 Clicks" Mobility  Outcome Measure Help needed turning from your back to your side while in a flat bed without using bedrails?: None Help needed moving from lying on your back to sitting on the side of a flat bed without using bedrails?: None Help needed moving to and from a bed to a chair (including a wheelchair)?: None Help needed standing up from a chair using your arms (e.g., wheelchair or bedside chair)?: None Help needed to walk in hospital room?: None Help needed climbing 3-5 steps with a railing? : None 6 Click Score: 24    End of Session   Activity Tolerance: Patient tolerated treatment well Patient left: in bed;with call bell/phone within reach Nurse Communication: Mobility status PT Visit Diagnosis: Other abnormalities of gait and mobility (R26.89)    Time: 4656-8127 PT Time Calculation (min) (ACUTE ONLY): 18 min   Charges:   PT Evaluation $PT Eval Low Complexity: 1 Low        Vinita Prentiss S, PT DPT Acute Rehabilitation Services Pager [phone]  Office [phone]   Roxine Caddy E Ruffin Pyo 12/07/2020, 9:11 AM

## 2020-12-07 NOTE — Evaluation (Signed)
Occupational Therapy Evaluation Patient Details Name: Richard Franco MRN: [id number] DOB: [birth year] Today's Date: 12/07/2020    History of Present Illness  55 yo male s/p bil C2 ganglionectomies, extension of posterior cervical fusion C1-C5 on 6/15 for worsening axial neck pain and occipital neuralgia. PMH including prior C3-4 ACDF, posterior cervical foraminotomy, C5-6 and C6-7 ACDFs w/ pseudo then C5-7 PSIF w/ post-op wound infection, C4-5 ACDF w/ pseudo requiring extension of posterior hardware to C4-5, chronic pain, anxiety, OA, CAD, depression, fibromyalgia, GERD, HTN, and tobacco abuse.    Clinical Impression   PTA, pt was living with his girlfriend and was independent. Currently, pt performing ADLs and functional mobility at Mod I level. Provided education and handout on cervical precaution, UB ADLs, LB ADLs, toileting, and tub transfer; pt demonstrated understanding. Answered all pt questions. Recommend dc home once medically stable per physician. All acute OT needs met and will sign off. Thank you.    Follow Up Recommendations  No OT follow up    Equipment Recommendations  None recommended by OT    Recommendations for Other Services       Precautions / Restrictions Precautions Precautions: Cervical Precaution Booklet Issued: Yes (comment) Precaution Comments: Reviewed cervical precautions and compensatory techniques Required Braces or Orthoses: Other Brace Other Brace: No brace per order Restrictions Weight Bearing Restrictions: No      Mobility Bed Mobility Overal bed mobility: Modified Independent             General bed mobility comments: Increased time. Educating pt log roll techniques    Transfers Overall transfer level: Independent                    Balance Overall balance assessment: No apparent balance deficits (not formally assessed)                                         ADL either performed or assessed with clinical  judgement   ADL Overall ADL's : Modified independent                                       General ADL Comments: Increased time for compensatory techniques. Providing education on cervical precautions, bed mobility, UB ADLs, LB ADLs, grooming, toileting, and tub transfer. Pt demonstrating understanding     Vision         Perception     Praxis      Pertinent Vitals/Pain Pain Assessment: Faces Faces Pain Scale: Hurts a little bit Pain Location: Neck Pain Descriptors / Indicators: Discomfort Pain Intervention(s): Monitored during session;Repositioned     Hand Dominance     Extremity/Trunk Assessment Upper Extremity Assessment Upper Extremity Assessment: Overall WFL for tasks assessed   Lower Extremity Assessment Lower Extremity Assessment: Overall WFL for tasks assessed   Cervical / Trunk Assessment Cervical / Trunk Assessment: Other exceptions Cervical / Trunk Exceptions: s/p cervical sx   Communication Communication Communication: No difficulties   Cognition Arousal/Alertness: Awake/alert Behavior During Therapy: WFL for tasks assessed/performed Overall Cognitive Status: Within Functional Limits for tasks assessed                                     General Comments  Exercises     Shoulder Instructions      Home Living Family/patient expects to be discharged to:: Private residence Living Arrangements: Spouse/significant other Available Help at Discharge: Family;Available 24 hours/day Type of Home: House Home Access: Stairs to enter [employer] of Steps: 3 Entrance Stairs-Rails: Right Home Layout: One level     Bathroom Shower/Tub: Teacher, early years/pre: Standard     Home Equipment: Cane - single point          Prior Functioning/Environment Level of Independence: Independent        Comments: ADLs, IADLs, drives, and works as a Advice worker Problem List: Decreased range  of motion;Decreased knowledge of use of DME or AE;Decreased knowledge of precautions;Decreased activity tolerance      OT Treatment/Interventions:      OT Goals(Current goals can be found in the care plan section) Acute Rehab OT Goals Patient Stated Goal: Go home OT Goal Formulation: All assessment and education complete, DC therapy  OT Frequency:     Barriers to D/C:            Co-evaluation              AM-PAC OT "6 Clicks" Daily Activity     Outcome Measure Help from another person eating meals?: None Help from another person taking care of personal grooming?: None Help from another person toileting, which includes using toliet, bedpan, or urinal?: None Help from another person bathing (including washing, rinsing, drying)?: None Help from another person to put on and taking off regular upper body clothing?: None Help from another person to put on and taking off regular lower body clothing?: None 6 Click Score: 24   End of Session Equipment Utilized During Treatment: Gait belt Nurse Communication: Mobility status  Activity Tolerance: Patient tolerated treatment well Patient left: in bed;with call bell/phone within reach  OT Visit Diagnosis: Unsteadiness on feet (R26.81);Other abnormalities of gait and mobility (R26.89);Muscle weakness (generalized) (M62.81)                Time: 3557-3220 OT Time Calculation (min): 13 min Charges:  OT General Charges $OT Visit: 1 Visit OT Evaluation $OT Eval Low Complexity: [place], OTR/L Acute Rehab Pager: [phone] Office: [place] 12/07/2020, 8:42 AM

## 2020-12-08 MED ORDER — CYCLOBENZAPRINE HCL 10 MG PO TABS: 10.0000 mg | ORAL_TABLET | Freq: Three times a day (TID) | ORAL | 0 refills | Status: DC | PRN

## 2020-12-08 MED ORDER — ESOMEPRAZOLE MAGNESIUM 40 MG PO CPDR: 40.0000 mg | DELAYED_RELEASE_CAPSULE | Freq: Two times a day (BID) | ORAL | Status: DC

## 2020-12-08 MED ORDER — OXYCODONE HCL 5 MG PO TABS: 5.0000 mg | ORAL_TABLET | ORAL | 0 refills | Status: DC | PRN

## 2020-12-08 MED ORDER — ASPIRIN 81 MG PO TBEC: 81.0000 mg | DELAYED_RELEASE_TABLET | Freq: Every day | ORAL | 12 refills | Status: AC

## 2020-12-08 NOTE — Progress Notes (Signed)
Neurosurgery Service Progress Note  Subjective: No acute events overnight, neck pain improved w/ some toradol yesterday, no new complaints  Objective: Vitals:   12/07/20 2014 12/07/20 2326 12/08/20 0452 12/08/20 0740  BP: 101/60 (!) 103/57 95/62 (!) 90/51  Pulse: 99 100 85 92  Resp: 18 18 20 18   Temp: 98.6 F (37 C) 99.1 F (37.3 C) 98.4 F (36.9 C) 98.3 F (36.8 C)  TempSrc: Oral Oral Oral Oral  SpO2: 93% 97% 97% 93%  Weight:      Height:        Physical Exam: Strength 5/5 x4, SILTx4 except bilateral gr occipital numbness Incision c/d/i  Assessment & Plan: 55 y.o. man s/p b/l C2 ganglionectomies, extension of hardware C1-C5, recovering well.  -voiding well, can discharge home today  Judith Part  12/08/20 10:52 AM

## 2020-12-08 NOTE — Plan of Care (Signed)
Adequately ready for discharge 

## 2020-12-08 NOTE — Discharge Summary (Signed)
Discharge Summary  Date of Admission: 12/06/2020  Date of Discharge: 12/08/20  Attending Physician: Emelda Brothers, MD  Hospital Course: Patient was admitted following an uncomplicated bilateral C2 ganglionectomies, extension of posterior cervical fusion up to C1, now spanning C1-C5. He was recovered in PACU and transferred to [facility]. His hospital course was uncomplicated, he has a history of post-op urinary retention and was started on some prophylactic flomax that prevented any retention, foley was d/c'd on POD1 without issue. The patient was discharged home on 12/08/20. He will follow up in clinic with me in 2 weeks.  Neurologic exam at discharge:  Strength 5/5 x4, SILTx4, no drift  Discharge diagnosis: Occipital neuralgia, cervical spondylosis  Judith Part, MD 12/08/20 10:58 AM

## 2020-12-09 ENCOUNTER — Inpatient Hospital Stay (HOSPITAL_COMMUNITY)
Admission: EM | Admit: 2020-12-09 | Discharge: 2020-12-13 | DRG: 948 | Disposition: A | Discharge status: Home / Self Care | Payer: Medicare Other | Attending: Neurological Surgery | Admitting: Neurological Surgery

## 2020-12-09 ENCOUNTER — Other Ambulatory Visit: Payer: Self-pay

## 2020-12-09 ENCOUNTER — Encounter (HOSPITAL_COMMUNITY): Payer: Self-pay

## 2020-12-09 DIAGNOSIS — Z83438 Family history of other disorder of lipoprotein metabolism and other lipidemia: Secondary | ICD-10-CM | POA: Diagnosis not present

## 2020-12-09 DIAGNOSIS — I2510 Atherosclerotic heart disease of native coronary artery without angina pectoris: Secondary | ICD-10-CM | POA: Diagnosis present

## 2020-12-09 DIAGNOSIS — R509 Fever, unspecified: Secondary | ICD-10-CM | POA: Diagnosis present

## 2020-12-09 DIAGNOSIS — K219 Gastro-esophageal reflux disease without esophagitis: Secondary | ICD-10-CM | POA: Diagnosis present

## 2020-12-09 DIAGNOSIS — Z981 Arthrodesis status: Secondary | ICD-10-CM | POA: Diagnosis not present

## 2020-12-09 DIAGNOSIS — G8918 Other acute postprocedural pain: Principal | ICD-10-CM | POA: Diagnosis present

## 2020-12-09 DIAGNOSIS — F419 Anxiety disorder, unspecified: Secondary | ICD-10-CM | POA: Diagnosis present

## 2020-12-09 DIAGNOSIS — M5481 Occipital neuralgia: Secondary | ICD-10-CM | POA: Diagnosis present

## 2020-12-09 DIAGNOSIS — I10 Essential (primary) hypertension: Secondary | ICD-10-CM | POA: Diagnosis present

## 2020-12-09 DIAGNOSIS — Z825 Family history of asthma and other chronic lower respiratory diseases: Secondary | ICD-10-CM

## 2020-12-09 DIAGNOSIS — Z8249 Family history of ischemic heart disease and other diseases of the circulatory system: Secondary | ICD-10-CM | POA: Diagnosis not present

## 2020-12-09 DIAGNOSIS — T819XXA Unspecified complication of procedure, initial encounter: Secondary | ICD-10-CM | POA: Diagnosis present

## 2020-12-09 DIAGNOSIS — Z87891 Personal history of nicotine dependence: Secondary | ICD-10-CM

## 2020-12-09 DIAGNOSIS — I252 Old myocardial infarction: Secondary | ICD-10-CM | POA: Diagnosis not present

## 2020-12-09 DIAGNOSIS — M797 Fibromyalgia: Secondary | ICD-10-CM | POA: Diagnosis present

## 2020-12-09 DIAGNOSIS — M47812 Spondylosis without myelopathy or radiculopathy, cervical region: Secondary | ICD-10-CM | POA: Diagnosis present

## 2020-12-09 DIAGNOSIS — Z833 Family history of diabetes mellitus: Secondary | ICD-10-CM | POA: Diagnosis not present

## 2020-12-09 LAB — CBC WITH DIFFERENTIAL/PLATELET
Abs Immature Granulocytes: 0.03 10*3/uL (ref 0.00–0.07)
Basophils Absolute: 0 10*3/uL (ref 0.0–0.1)
Basophils Relative: 0 %
Eosinophils Absolute: 0.1 10*3/uL (ref 0.0–0.5)
Eosinophils Relative: 2 %
HCT: 27.8 % — ABNORMAL LOW (ref 39.0–52.0)
Hemoglobin: 9.2 g/dL — ABNORMAL LOW (ref 13.0–17.0)
Immature Granulocytes: 1 %
Lymphocytes Relative: 17 %
Lymphs Abs: 1 10*3/uL (ref 0.7–4.0)
MCH: 31 pg (ref 26.0–34.0)
MCHC: 33.1 g/dL (ref 30.0–36.0)
MCV: 93.6 fL (ref 80.0–100.0)
Monocytes Absolute: 0.6 10*3/uL (ref 0.1–1.0)
Monocytes Relative: 10 %
Neutro Abs: 4.3 10*3/uL (ref 1.7–7.7)
Neutrophils Relative %: 70 %
Platelets: 190 10*3/uL (ref 150–400)
RBC: 2.97 MIL/uL — ABNORMAL LOW (ref 4.22–5.81)
RDW: 13.1 % (ref 11.5–15.5)
WBC: 6 10*3/uL (ref 4.0–10.5)
nRBC: 0 % (ref 0.0–0.2)

## 2020-12-09 LAB — BASIC METABOLIC PANEL
Anion gap: 4 — ABNORMAL LOW (ref 5–15)
BUN: 10 mg/dL (ref 6–20)
CO2: 31 mmol/L (ref 22–32)
Calcium: 8.6 mg/dL — ABNORMAL LOW (ref 8.9–10.3)
Chloride: 102 mmol/L (ref 98–111)
Creatinine, Ser: 0.97 mg/dL (ref 0.61–1.24)
GFR, Estimated: >60 mL/min (ref >60)
Glucose, Bld: 98 mg/dL (ref 70–99)
Potassium: 3.6 mmol/L (ref 3.5–5.1)
Sodium: 137 mmol/L (ref 135–145)

## 2020-12-09 LAB — SURGICAL PCR SCREEN
MRSA, PCR: NEGATIVE
Staphylococcus aureus: NEGATIVE

## 2020-12-09 MED ORDER — DIPHENHYDRAMINE HCL 12.5 MG/5ML PO ELIX: 12.5000 mg | ORAL_SOLUTION | Freq: Four times a day (QID) | ORAL | Status: DC | PRN

## 2020-12-09 MED ORDER — CEFAZOLIN SODIUM-DEXTROSE 2-4 GM/100ML-% IV SOLN
2.0000 g | Freq: Three times a day (TID) | INTRAVENOUS | Status: AC
  Administered 2020-12-10: 2 g via INTRAVENOUS
  Filled 2020-12-09 (×3): qty 100

## 2020-12-09 MED ORDER — NALOXONE HCL 0.4 MG/ML IJ SOLN: 0.4000 mg | INTRAMUSCULAR | Status: DC | PRN

## 2020-12-09 MED ORDER — DOCUSATE SODIUM 100 MG PO CAPS
100.0000 mg | ORAL_CAPSULE | Freq: Two times a day (BID) | ORAL | Status: DC
  Administered 2020-12-09 – 2020-12-13 (×8): 100 mg via ORAL
  Filled 2020-12-09 (×8): qty 1

## 2020-12-09 MED ORDER — PHENOL 1.4 % MT LIQD: 1.0000 | OROMUCOSAL | Status: DC | PRN

## 2020-12-09 MED ORDER — SODIUM CHLORIDE 0.9% FLUSH: 9.0000 mL | INTRAVENOUS | Status: DC | PRN

## 2020-12-09 MED ORDER — OXYCODONE HCL 5 MG PO TABS
10.0000 mg | ORAL_TABLET | ORAL | Status: DC | PRN
Start: 2020-12-09 — End: 2020-12-09
  Administered 2020-12-09: 10 mg via ORAL
  Filled 2020-12-09: qty 2

## 2020-12-09 MED ORDER — HYDROMORPHONE HCL 1 MG/ML IJ SOLN
1.0000 mg | INTRAMUSCULAR | Status: DC | PRN
  Administered 2020-12-09 – 2020-12-10 (×4): 1 mg via INTRAVENOUS
  Filled 2020-12-09 (×4): qty 1

## 2020-12-09 MED ORDER — HYDROMORPHONE HCL 1 MG/ML IJ SOLN
0.5000 mg | INTRAMUSCULAR | Status: DC | PRN
  Administered 2020-12-10 (×3): 0.5 mg via INTRAVENOUS
  Filled 2020-12-09 (×3): qty 1

## 2020-12-09 MED ORDER — POLYETHYLENE GLYCOL 3350 17 G PO PACK: 17.0000 g | PACK | Freq: Every day | ORAL | Status: DC | PRN

## 2020-12-09 MED ORDER — ONDANSETRON HCL 4 MG/2ML IJ SOLN: 4.0000 mg | Freq: Four times a day (QID) | INTRAMUSCULAR | Status: DC | PRN

## 2020-12-09 MED ORDER — ACETAMINOPHEN 325 MG PO TABS
650.0000 mg | ORAL_TABLET | ORAL | Status: DC | PRN
  Administered 2020-12-09 – 2020-12-11 (×5): 650 mg via ORAL
  Filled 2020-12-09 (×5): qty 2

## 2020-12-09 MED ORDER — ONDANSETRON HCL 4 MG PO TABS
4.0000 mg | ORAL_TABLET | Freq: Four times a day (QID) | ORAL | Status: DC | PRN
  Administered 2020-12-10: 4 mg via ORAL
  Filled 2020-12-09: qty 1

## 2020-12-09 MED ORDER — SODIUM CHLORIDE 0.9 % IV SOLN
250.0000 mL | INTRAVENOUS | Status: DC
  Administered 2020-12-10: 250 mL via INTRAVENOUS

## 2020-12-09 MED ORDER — OXYCODONE HCL 5 MG PO TABS
5.0000 mg | ORAL_TABLET | ORAL | Status: DC | PRN
Start: 2020-12-09 — End: 2020-12-09

## 2020-12-09 MED ORDER — DIPHENHYDRAMINE HCL 50 MG/ML IJ SOLN: 12.5000 mg | Freq: Four times a day (QID) | INTRAMUSCULAR | Status: DC | PRN

## 2020-12-09 MED ORDER — HYDROMORPHONE HCL 1 MG/ML IJ SOLN: 1.0000 mg | INTRAMUSCULAR | Status: DC | PRN

## 2020-12-09 MED ORDER — CYCLOBENZAPRINE HCL 10 MG PO TABS
10.0000 mg | ORAL_TABLET | Freq: Three times a day (TID) | ORAL | Status: DC | PRN
  Administered 2020-12-09 – 2020-12-12 (×5): 10 mg via ORAL
  Filled 2020-12-09 (×5): qty 1

## 2020-12-09 MED ORDER — ACETAMINOPHEN 650 MG RE SUPP: 650.0000 mg | RECTAL | Status: DC | PRN

## 2020-12-09 MED ORDER — HYDROMORPHONE 1 MG/ML IV SOLN
INTRAVENOUS | Status: DC
  Filled 2020-12-09: qty 30

## 2020-12-09 MED ORDER — SODIUM CHLORIDE 0.9% FLUSH: 3.0000 mL | INTRAVENOUS | Status: DC | PRN

## 2020-12-09 MED ORDER — OXYCODONE HCL 5 MG PO TABS
15.0000 mg | ORAL_TABLET | ORAL | Status: DC | PRN
Start: 2020-12-09 — End: 2020-12-12
  Administered 2020-12-09 – 2020-12-12 (×13): 15 mg via ORAL
  Filled 2020-12-09 (×14): qty 3

## 2020-12-09 MED ORDER — MENTHOL 3 MG MT LOZG
1.0000 | LOZENGE | OROMUCOSAL | Status: DC | PRN
  Filled 2020-12-09: qty 9

## 2020-12-09 MED ORDER — SODIUM CHLORIDE 0.9% FLUSH
3.0000 mL | Freq: Two times a day (BID) | INTRAVENOUS | Status: DC
  Administered 2020-12-09 – 2020-12-13 (×6): 3 mL via INTRAVENOUS

## 2020-12-09 MED ORDER — OXYCODONE HCL 5 MG PO TABS: 15.0000 mg | ORAL_TABLET | ORAL | Status: DC | PRN

## 2020-12-09 NOTE — ED Notes (Signed)
Called x 2 NO answer on floor

## 2020-12-09 NOTE — ED Triage Notes (Signed)
Patient had neck surgery this past week and having severe pain. States that he went home yesterday and neuro surgeon wanted to be notified on patient arrival. Denies any fall, etc.

## 2020-12-09 NOTE — ED Provider Notes (Signed)
Emergency Medicine Provider Triage Evaluation Note  Richard Franco , a 55 y.o. male  was evaluated in triage.  Pt complains of severe worsening neck pain s/p procedure done by Dr. Zada Finders on 06/15. Pt had uncomplicated bilateral C2 ganglionectomies, extension of posterior cervical fusion up to C1, now spanning C1-C5. He was discharged yesterday and had been doing well until last night when he developed a fever as high as 101.0. He took Ibuprofen which broke it. He is also taken 20 mg Oxycodone at a time without relief. Pt states he called Dr. Colleen Can office today and was advised to come to the ED for further evaluation. Pt mentions Dr. Zada Finders wanted to be notified when pt arrived.   Review of Systems  Positive: + neck pain, fever Negative: - weakness, numbness, tingling  Physical Exam  BP 125/81 (BP Location: Left Arm)   Pulse (!) 116   Temp 98.6 F (37 C) (Oral)   Resp 20   SpO2 97%  Gen:   Awake, no distress   Resp:  Normal effort  MSK:   Moves extremities without difficulty  Other:    Medical Decision Making  Medically screening exam initiated at 2:42 PM.  Appropriate orders placed.  Richard Franco was informed that the remainder of the evaluation will be completed by another provider, this initial triage assessment does not replace that evaluation, and the importance of remaining in the ED until their evaluation is complete.    Call Dr. Zada Finders and spoke with call line who stated they would page him. Re-attempted to contact him without success at this time. Screening labs ordered.    Eustaquio Maize, PA-C 12/09/20 1534    Arnaldo Natal, MD 12/10/20 [phone]

## 2020-12-09 NOTE — H&P (Signed)
Neurosurgery H&P  CC: Neck pain  HPI: This is a 55 y.o. man w/ h/o occipital neuralgia and severe cervical spondylosis, had posterior cervical instrumented fusion and bilateral C2 ganglionectomies this past week. He has been home and trying increasing pain medication, unfortunately with continued poor pain control. No new radicular symptoms or weakness, no new numbness, occipital pain is still gone but surgical site pain is severe. He does take oxy-apap 10-325 at home so not unexpected that he's having difficult pain control given the usual pain associated with this surgery.    ROS: A 14 point ROS was performed and is negative except as noted in the HPI.   PMHx:  Past Medical History:  Diagnosis Date   Abnormal chest CT 03/2012   a. 2013: Scattered patchy ground glass opacities and pulmonary nodules. Transbronchial biopsy with no definite etiology of lung finding- possible organizing pneumonia. b. F/u CT 05/2014: stable multiple tiny pulm nodules, no further w/u recommended per notes.    Anxiety    Arthritis    CAD (coronary artery disease)    Chronic midline posterior neck pain    Chronic pain disorder    Complication of anesthesia    " difficult to urinate after "   Depression    Esophageal stricture    Fibromyalgia    Fracture    back   GERD (gastroesophageal reflux disease)    Hypercholesteremia    Hypertension    Migraine headache    "none lately; I've had alot in my life" (06/21/2015)   NSTEMI (non-ST elevated myocardial infarction) ([place]) 07/2012   a. Normal cath 03/2012. b. 07/2012: troponin 4, normal cors, ?vasospasm. Did not tolerate Imdur due to headache. On amlodipine.   Testosterone deficiency    Tobacco abuse    FamHx:  Family History  Problem Relation Age of Onset   Diabetes Mother    Cancer Mother        ?   Hypertension Mother    Hyperlipidemia Father    Diabetes Father    Coronary artery disease Paternal Uncle    Asthma Sister    Diabetes Brother     Hypertension Brother    Colon cancer Neg Hx    Colon polyps Neg Hx    Rectal cancer Neg Hx    Stomach cancer Neg Hx    SocHx:  reports that he quit smoking about 2 years ago. His smoking use included cigarettes. He has a 30.00 pack-year smoking history. He has quit using smokeless tobacco.  His smokeless tobacco use included chew. He reports previous alcohol use of about 21.0 standard drinks of alcohol per week. He reports that he does not use drugs.  Exam: Vital signs in last 24 hours: Temp:  [98.6 F (37 C)] 98.6 F (37 C) (06/18 1738) Pulse Rate:  [90-116] 90 (06/18 1738) Resp:  [17-20] 17 (06/18 1738) BP: (111-125)/(54-81) 111/54 (06/18 1738) SpO2:  [97 %-100 %] 100 % (06/18 1738) Weight:  [77.9 kg] 77.9 kg (06/18 1811) General: \Awake, alert, cooperative, lying in bed in NAD Head: Normocephalic and atruamatic HEENT: Neck with increased paraspinal muscular tone but otherwise WNL, decreased ROM 2/2 multisegment cervical fusion Pulmonary: breathing room air comfortably, no evidence of increased work of breathing Cardiac: RRR Abdomen: S NT ND Extremities: Warm and well perfused x4 Neuro: AOx3, PERRL, EOMI, FS Strength 5/5 x4, SILTx4 Incision c/d/I, mildly tender but not severely tender  Assessment and Plan: 55 y.o. man s/p b/l C2 ganglionectomies, extension of fusion C1-C5, now  readmitted with poor post-op pain control.   -will try hydromorphone IVP, if still poorly controlled then can start a PCA  -regular diet -activity as tolerated  Judith Part, MD 12/09/20 6:19 PM [facility]

## 2020-12-09 NOTE — ED Provider Notes (Signed)
Patient seen in ED Triage by preceding ED provider, Eustaquio Maize, PA-C.  Patient with postoperative pain, 3 days status post uncomplicated bilateral C2 ganglionectomy's with extension of cervical fusion.  Presenting for worsening pain refractory to opiates at home, as well as reported fever of 101 F.  Fever resolved with ibuprofen.  Question postoperative fever versus infectious etiology.  Page was called to Dr. Venetia Constable by prior ED provider, without return call, however at this time there is a Brief note in place by Dr. Venetia Constable stating the patient may be admitted to his service to [place] floor, and that he will see the patient when able.  Admission orders were placed, so I have placed temporary admission orders at this time.  Will not repeat COVID-19 test at this time given he was discharged yesterday with negative test at that admission.  Patient aware of plan for admission at this time.  Will await communication from neurosurgical team for any other recommendations for orders to be placed in the interim.  This chart was dictated using voice recognition software, Dragon. Despite the best efforts of this provider to proofread and correct errors, errors may still occur which can change documentation meaning.    Richard Franco 12/09/20 1619    Richard Natal, MD 12/10/20 1251

## 2020-12-10 MED ORDER — HYDROMORPHONE 1 MG/ML IV SOLN
INTRAVENOUS | Status: DC
  Filled 2020-12-10: qty 30

## 2020-12-10 MED ORDER — HEPARIN SODIUM (PORCINE) 5000 UNIT/ML IJ SOLN
5000.0000 [IU] | Freq: Three times a day (TID) | INTRAMUSCULAR | Status: DC
  Administered 2020-12-10 – 2020-12-13 (×10): 5000 [IU] via SUBCUTANEOUS
  Filled 2020-12-10 (×10): qty 1

## 2020-12-10 NOTE — Plan of Care (Signed)

## 2020-12-10 NOTE — Progress Notes (Signed)
Neurosurgery Service Progress Note  Subjective: No acute events overnight, some improvement in pain but still severe, no new weakness/numbness   Objective: Vitals:   12/09/20 2340 12/10/20 0353 12/10/20 0502 12/10/20 0740  BP: 119/76 104/72 111/70 110/73  Pulse: (!) 101 84 81 84  Resp: 18 16  16   Temp: 99.1 F (37.3 C) 99.2 F (37.3 C)  98.6 F (37 C)  TempSrc: Oral Oral  Oral  SpO2: 95% 95%    Weight:      Height:        Physical Exam: AOx3, PERRL, EOMI, FS, TM, Strength 5/5 x4, SILTx4 Incision c/d/i  Assessment & Plan: 55 y.o. man s/p C1-C5 with C2 ganglionectomies, readmitted for post-op pain.  -will switch over to PCA today for better control -SQH  Judith Part  12/10/20 8:45 AM

## 2020-12-10 NOTE — Evaluation (Signed)
Physical Therapy Evaluation and Discharge Patient Details Name: Richard Franco MRN: [id number] DOB: 1965-09-27 Today's Date: 12/10/2020   History of Present Illness  Pt is a 55 yo male admitted with post op poor pain control. S/p bil C2 ganglionectomies, extension of posterior cervical fusion C1-C5 on 6/15 for worsening axial neck pain and occipital neuralgia. Pt with prior C3-4 ACDF, posterior cervical foraminotomy, C5-6 and C6-7 ACDFs w/ pseudo then C5-7 PSIF w/ post-op wound infection, then C4-5 ACDF w/ pseudo requiring extension of posterior hardware to C4-5. Other PMH includes chronic pain, anxiety, OA, CAD, depression, fibromyalgia, GERD, HTN, tobacco abuse.  Clinical Impression  Patient evaluated by Physical Therapy with no further acute PT needs identified. Pt reports significant pain from mid-upper cervical; PCA recently initiated. However, pt pleasant and agreeable to participate in therapy evaluation. Ambulating x 500 feet with no assistive device and negotiated steps without physical difficulty. Pt denies new numbness/tingling or changes in mobility. Encouraged continued ambulation on unit while inpatient. All education has been completed and the patient has no further questions. No follow-up Physical Therapy or equipment needs. PT is signing off. Thank you for this referral.     Follow Up Recommendations No PT follow up    Equipment Recommendations  None recommended by PT    Recommendations for Other Services       Precautions / Restrictions Precautions Precautions: Cervical;Other (comment) Precaution Booklet Issued:  (issued 6/16) Precaution Comments: PCA Required Braces or Orthoses:  (no brace per order) Restrictions Weight Bearing Restrictions: No      Mobility  Bed Mobility Overal bed mobility: Modified Independent                  Transfers Overall transfer level: Independent Equipment used: None                Ambulation/Gait Ambulation/Gait  assistance: Independent Social research officer, government (Feet): 500 Feet Assistive device: None;IV Pole Gait Pattern/deviations: WFL(Within Functional Limits)        Stairs Stairs: Yes Stairs assistance: Modified independent (Device/Increase time) Stair Management: One rail Left Number of Stairs: 2    Wheelchair Mobility    Modified Rankin (Stroke Patients Only)       Balance Overall balance assessment: No apparent balance deficits (not formally assessed)                                           Pertinent Vitals/Pain Pain Assessment: Faces Faces Pain Scale: Hurts even more Pain Location: Neck Pain Descriptors / Indicators: Discomfort;Sore Pain Intervention(s): Limited activity within patient's tolerance;Monitored during session;PCA encouraged    Home Living Family/patient expects to be discharged to:: Private residence Living Arrangements: Spouse/significant other Available Help at Discharge: Family;Available 24 hours/day Type of Home: House Home Access: Stairs to enter Entrance Stairs-Rails: Right Entrance Stairs-Number of Steps: 3 Home Layout: One level Home Equipment: Cane - single point      Prior Function Level of Independence: Independent         Comments: ADLs, IADLs, drives, and works as a Herbalist Extremity Assessment Upper Extremity Assessment: Overall WFL for tasks assessed    Lower Extremity Assessment Lower Extremity Assessment: Overall WFL for tasks assessed    Cervical / Trunk Assessment Cervical / Trunk Assessment: Other exceptions Cervical / Trunk Exceptions: s/p  cervical sx  Communication   Communication: No difficulties  Cognition Arousal/Alertness: Awake/alert Behavior During Therapy: WFL for tasks assessed/performed Overall Cognitive Status: Within Functional Limits for tasks assessed                                        General  Comments      Exercises     Assessment/Plan    PT Assessment Patent does not need any further PT services  PT Problem List         PT Treatment Interventions      PT Goals (Current goals can be found in the Care Plan section)  Acute Rehab PT Goals Patient Stated Goal: less pain PT Goal Formulation: All assessment and education complete, DC therapy    Frequency     Barriers to discharge        Co-evaluation               AM-PAC PT "6 Clicks" Mobility  Outcome Measure Help needed turning from your back to your side while in a flat bed without using bedrails?: None Help needed moving from lying on your back to sitting on the side of a flat bed without using bedrails?: None Help needed moving to and from a bed to a chair (including a wheelchair)?: None Help needed standing up from a chair using your arms (e.g., wheelchair or bedside chair)?: None Help needed to walk in hospital room?: None Help needed climbing 3-5 steps with a railing? : None 6 Click Score: 24    End of Session   Activity Tolerance: Patient tolerated treatment well Patient left: in bed;with call bell/phone within reach Nurse Communication: Mobility status PT Visit Diagnosis: Other abnormalities of gait and mobility (R26.89)    Time: 1010-1030 PT Time Calculation (min) (ACUTE ONLY): 20 min   Charges:   PT Evaluation $PT Eval Low Complexity: [place], PT, DPT Acute Rehabilitation Services Pager [phone] Office [phone]   Deno Etienne 12/10/2020, 1:08 PM

## 2020-12-11 ENCOUNTER — Other Ambulatory Visit (HOSPITAL_COMMUNITY): Payer: Medicare Other

## 2020-12-11 MED ORDER — FLUTICASONE FUROATE-VILANTEROL 100-25 MCG/INH IN AEPB
1.0000 | INHALATION_SPRAY | Freq: Every day | RESPIRATORY_TRACT | Status: DC
  Administered 2020-12-12 – 2020-12-13 (×2): 1 via RESPIRATORY_TRACT
  Filled 2020-12-11: qty 28

## 2020-12-11 MED ORDER — ALBUTEROL SULFATE (2.5 MG/3ML) 0.083% IN NEBU: 2.5000 mg | INHALATION_SOLUTION | Freq: Four times a day (QID) | RESPIRATORY_TRACT | Status: DC | PRN

## 2020-12-11 MED ORDER — FLUOXETINE HCL 20 MG PO CAPS
40.0000 mg | ORAL_CAPSULE | Freq: Every day | ORAL | Status: DC
  Administered 2020-12-11 – 2020-12-12 (×2): 40 mg via ORAL
  Filled 2020-12-11 (×3): qty 2

## 2020-12-11 MED ORDER — AMLODIPINE BESYLATE 5 MG PO TABS
5.0000 mg | ORAL_TABLET | Freq: Every day | ORAL | Status: DC
  Administered 2020-12-11 – 2020-12-12 (×2): 5 mg via ORAL
  Filled 2020-12-11 (×3): qty 1

## 2020-12-11 MED ORDER — QUETIAPINE FUMARATE 50 MG PO TABS
50.0000 mg | ORAL_TABLET | Freq: Every evening | ORAL | Status: DC | PRN
  Administered 2020-12-12: 50 mg via ORAL
  Filled 2020-12-11: qty 1

## 2020-12-11 MED ORDER — LAMOTRIGINE 100 MG PO TABS
300.0000 mg | ORAL_TABLET | Freq: Every day | ORAL | Status: DC
  Administered 2020-12-11 – 2020-12-12 (×2): 300 mg via ORAL
  Filled 2020-12-11 (×3): qty 3

## 2020-12-11 MED ORDER — ALBUTEROL SULFATE HFA 108 (90 BASE) MCG/ACT IN AERS: 1.0000 | INHALATION_SPRAY | Freq: Four times a day (QID) | RESPIRATORY_TRACT | Status: DC | PRN

## 2020-12-11 MED ORDER — POLYVINYL ALCOHOL 1.4 % OP SOLN
1.0000 [drp] | Freq: Two times a day (BID) | OPHTHALMIC | Status: DC | PRN
  Administered 2020-12-11: 1 [drp] via OPHTHALMIC
  Filled 2020-12-11: qty 15

## 2020-12-11 MED ORDER — PANTOPRAZOLE SODIUM 40 MG PO TBEC
40.0000 mg | DELAYED_RELEASE_TABLET | Freq: Every day | ORAL | Status: DC
  Administered 2020-12-11 – 2020-12-13 (×3): 40 mg via ORAL
  Filled 2020-12-11 (×3): qty 1

## 2020-12-11 MED ORDER — NITROGLYCERIN 0.4 MG SL SUBL: 0.4000 mg | SUBLINGUAL_TABLET | SUBLINGUAL | Status: DC | PRN

## 2020-12-11 NOTE — Plan of Care (Signed)

## 2020-12-11 NOTE — Discharge Summary (Signed)
Discharge Summary  Date of Admission: 12/09/2020  Date of Discharge: 12/13/20  Attending Physician: Emelda Brothers, MD  Hospital Course: Patient was re-admitted for pain control, he had a posterior cervical C2 ganglionectomy and extension of instrumented fusion from C1-C5 and was discharged home, but returned with poor pain control. He was started on a PCA with improvement in pain the converted over to a po regimen with good pain control. PT evaluated the patient and recommended home. His hospital course was uncomplicated and the patient was discharged home on 12/13/20. He will follow up in clinic with me in 2 weeks.  Neurologic exam at discharge:  Strength 5/5 x4, SILTx4  Discharge diagnosis: Post-operative pain, occipital neuralgia  Judith Part, MD 12/11/20 7:25 AM

## 2020-12-11 NOTE — Evaluation (Signed)
Occupational Therapy Evaluation/Discharge Patient Details Name: Richard Franco MRN: [id number] DOB: [birth year] Today's Date: 12/11/2020    History of Present Illness Pt is a 55 yo male admitted with post op poor pain control. S/p bil C2 ganglionectomies, extension of posterior cervical fusion C1-C5 on 6/15 for worsening axial neck pain and occipital neuralgia. Pt with prior C3-4 ACDF, posterior cervical foraminotomy, C5-6 and C6-7 ACDFs w/ pseudo then C5-7 PSIF w/ post-op wound infection, then C4-5 ACDF w/ pseudo requiring extension of posterior hardware to C4-5. Other PMH includes chronic pain, anxiety, OA, CAD, depression, fibromyalgia, GERD, HTN, tobacco abuse.   Clinical Impression   PTA, pt was independent and lived with his girlfriend. Currently pt is completing ADLs and functional mobility with mod I for increased time. Pt educated and demonstrating compensatory techniques for oral care, LB dressing, toileting, and shower transfers with mod I. Recommend discharge home with no follow up OT services at this time. OT to sign off. Re-consult if change in status.     Follow Up Recommendations  No OT follow up    Equipment Recommendations  None recommended by OT    Recommendations for Other Services       Precautions / Restrictions Precautions Precautions: Cervical;Fall;Other (comment) Precaution Comments: PCA Other Brace: No brace per order Restrictions Weight Bearing Restrictions: No      Mobility Bed Mobility Overal bed mobility: Modified Independent             General bed mobility comments: Pt able to recall log roll technique, but poor adherence.    Transfers Overall transfer level: Modified independent Equipment used: None             General transfer comment: Pt completing transfers with mod I. Pt managing IV pole and lines during functional mobility in hall and bathroom    Balance Overall balance assessment: No apparent balance deficits (not formally  assessed)                                         ADL either performed or assessed with clinical judgement   ADL Overall ADL's : Modified independent                                       General ADL Comments: Increased time for compensatory techniques. Pt educated and demonstrated cervical precautions, bed mobility, UB dressing, LB dressing, grooming, toileting, oral care, and tub transfer.     Vision   Vision Assessment?: No apparent visual deficits     Perception Perception Perception Tested?: No Comments: no apparent perceptual deficits during ADL tasks   Praxis Praxis Praxis tested?: Not tested Praxis-Other Comments: no apparent difficulty with motor planning    Pertinent Vitals/Pain Pain Assessment: Faces Faces Pain Scale: Hurts little more Pain Location: Neck Pain Descriptors / Indicators: Discomfort Pain Intervention(s): Limited activity within patient's tolerance;Monitored during session     Hand Dominance     Extremity/Trunk Assessment Upper Extremity Assessment Upper Extremity Assessment: Overall WFL for tasks assessed   Lower Extremity Assessment Lower Extremity Assessment: Defer to PT evaluation   Cervical / Trunk Assessment Cervical / Trunk Assessment: Other exceptions Cervical / Trunk Exceptions: s/p cervical sx   Communication Communication Communication: No difficulties   Cognition Arousal/Alertness: Awake/alert Behavior During Therapy: WFL for tasks assessed/performed Overall Cognitive  Status: Within Functional Limits for tasks assessed                                 General Comments: Pt pleasant and conversational throughout. Recalling 1 precaution, requiring verbal cues to recall remainder of precautions.   General Comments  Pt mother present    Exercises     Shoulder Instructions      Home Living Family/patient expects to be discharged to:: Private residence Living Arrangements:  Spouse/significant other Available Help at Discharge: Family;Available 24 hours/day Type of Home: House Home Access: Stairs to enter [employer] of Steps: 3 Entrance Stairs-Rails: Right Home Layout: One level     Bathroom Shower/Tub: Teacher, early years/pre: Standard     Home Equipment: Cane - single point          Prior Functioning/Environment Level of Independence: Independent        Comments: ADLs, IADLs, drives, and works as a Advice worker Problem List: Decreased range of motion;Decreased knowledge of precautions;Decreased activity tolerance;Pain      OT Treatment/Interventions:      OT Goals(Current goals can be found in the care plan section) Acute Rehab OT Goals Patient Stated Goal: less pain OT Goal Formulation: All assessment and education complete, DC therapy  OT Frequency:     Barriers to D/C:            Co-evaluation              AM-PAC OT "6 Clicks" Daily Activity     Outcome Measure Help from another person eating meals?: None Help from another person taking care of personal grooming?: None Help from another person toileting, which includes using toliet, bedpan, or urinal?: None Help from another person bathing (including washing, rinsing, drying)?: None Help from another person to put on and taking off regular upper body clothing?: None Help from another person to put on and taking off regular lower body clothing?: None 6 Click Score: 24   End of Session Equipment Utilized During Treatment: Gait belt Nurse Communication: Mobility status  Activity Tolerance: Patient tolerated treatment well Patient left: in chair;with call bell/phone within reach  OT Visit Diagnosis: Unsteadiness on feet (R26.81);Other abnormalities of gait and mobility (R26.89);Muscle weakness (generalized) (M62.81)                Time: 7014-1030 OT Time Calculation (min): 22 min Charges:  OT General Charges $OT Visit: 1 Visit OT  Evaluation $OT Eval Low Complexity: 1 Low  Shanda Howells, OTDS   Shanda Howells 12/11/2020, 1:47 PM

## 2020-12-11 NOTE — Progress Notes (Addendum)
Neurosurgery Service Progress Note  Subjective: No acute events overnight, pain better controlled overnight by a good margin  Objective: Vitals:   12/10/20 2318 12/10/20 2347 12/11/20 0319 12/11/20 0323  BP: 115/74  113/73   Pulse: 90  88   Resp: 15 16 15 15   Temp: 99.8 F (37.7 C)  98.8 F (37.1 C)   TempSrc: Oral  Oral   SpO2: 94% 94% 94% 94%  Weight:      Height:        Physical Exam: Strength 5/5 x4, SILTx4 Incision c/d/i  Assessment & Plan: 55 y.o. man s/p C1-C5 with C2 ganglionectomies, readmitted for post-op pain.  -PCA use: hydromorph 7mg  over 24h, will continue x1 more day and then d/c PCA & convert to PO tomorrow -restart home meds -PT rec'd home -SQH  Judith Part  12/11/20 7:23 AM

## 2020-12-12 MED ORDER — OXYCODONE HCL 5 MG PO TABS: 5.0000 mg | ORAL_TABLET | ORAL | Status: DC | PRN

## 2020-12-12 MED ORDER — OXYCODONE HCL ER 10 MG PO T12A
20.0000 mg | EXTENDED_RELEASE_TABLET | Freq: Two times a day (BID) | ORAL | Status: DC
  Administered 2020-12-12 – 2020-12-13 (×3): 20 mg via ORAL
  Filled 2020-12-12 (×3): qty 2

## 2020-12-12 MED ORDER — KETOROLAC TROMETHAMINE 30 MG/ML IJ SOLN
30.0000 mg | Freq: Three times a day (TID) | INTRAMUSCULAR | Status: AC
Start: 2020-12-12 — End: 2020-12-12
  Administered 2020-12-12 (×3): 30 mg via INTRAVENOUS
  Filled 2020-12-12 (×3): qty 1

## 2020-12-12 MED ORDER — OXYCODONE HCL 5 MG PO TABS
10.0000 mg | ORAL_TABLET | ORAL | Status: DC | PRN
  Administered 2020-12-12 – 2020-12-13 (×5): 10 mg via ORAL
  Filled 2020-12-12 (×4): qty 2

## 2020-12-12 MED ORDER — HYDROMORPHONE HCL 1 MG/ML IJ SOLN
1.0000 mg | INTRAMUSCULAR | Status: DC | PRN
  Administered 2020-12-12 – 2020-12-13 (×3): 1 mg via INTRAVENOUS
  Filled 2020-12-12 (×3): qty 1

## 2020-12-12 NOTE — Plan of Care (Signed)

## 2020-12-12 NOTE — Care Management Important Message (Signed)
Important Message  Patient Details  Name: Richard Franco MRN: [id number] Date of Birth: [birth year]   Medicare Important Message Given:  Yes     Orbie Pyo 12/12/2020, 2:16 PM

## 2020-12-12 NOTE — Progress Notes (Signed)
Neurosurgery Service Progress Note  Subjective: No acute events overnight, pain continues to improve  Objective: Vitals:   12/12/20 0432 12/12/20 0455 12/12/20 0700 12/12/20 0754  BP:  117/63  102/88  Pulse:  91 96   Resp: 18   20  Temp:  99.3 F (37.4 C)  99.2 F (37.3 C)  TempSrc:  Oral  Oral  SpO2: 95% 90% (!) 79% 92%  Weight:      Height:        Physical Exam: Strength 5/5 x4, SILTx4 Incision c/d/i  Assessment & Plan: 55 y.o. man s/p C1-C5 with C2 ganglionectomies, readmitted for post-op pain.  -convert to oxycontin 20q12 scheduled, then 5/10 instant release as needed with hydromorphone breakthrough -if he's well controlled on this regimen, can go home this afternoon if he feels up to it -PT rec'd home -Early  12/12/20 8:05 AM

## 2020-12-13 ENCOUNTER — Ambulatory Visit: Payer: Medicare Other | Admitting: Adult Health

## 2020-12-13 MED ORDER — OXYCODONE HCL ER 20 MG PO T12A: 20.0000 mg | EXTENDED_RELEASE_TABLET | Freq: Two times a day (BID) | ORAL | 0 refills | Status: DC

## 2020-12-13 NOTE — Progress Notes (Signed)
PT with discharge orders. Discharge paperwork reviewed with pt and all questions answered. IV removed. Prescriptions eletronically faxed to pharmacy. Pt escorted out via wheelchair with all belongings to private vehicle.

## 2020-12-13 NOTE — Progress Notes (Signed)
Neurosurgery Service Progress Note  Subjective: No acute events overnight, did well overnight w/o PCA  Objective: Vitals:   12/12/20 2316 12/13/20 0313 12/13/20 0738 12/13/20 0846  BP: 120/73 97/64 110/73   Pulse: 92 93 85   Resp: 20 20 14    Temp: 99.6 F (37.6 C) 98.8 F (37.1 C) 97.9 F (36.6 C)   TempSrc: Oral Oral Oral   SpO2: 98% 93% 97% 99%  Weight:      Height:        Physical Exam: Strength 5/5 x4, SILTx4 Incision c/d/i  Assessment & Plan: 55 y.o. man s/p C1-C5 with C2 ganglionectomies, readmitted for post-op pain.  -discharge home today Judith Part  12/13/20 9:14 AM

## 2020-12-15 LAB — TYPE AND SCREEN
ABO/RH(D): O POS
Antibody Screen: POSITIVE
Unit division: 0
Unit division: 0

## 2020-12-15 LAB — BPAM RBC
Blood Product Expiration Date: 202207122359
Blood Product Expiration Date: 202207132359
Unit Type and Rh: 5100
Unit Type and Rh: 5100

## 2021-09-12 ENCOUNTER — Emergency Department (HOSPITAL_BASED_OUTPATIENT_CLINIC_OR_DEPARTMENT_OTHER)
Admission: EM | Admit: 2021-09-12 | Discharge: 2021-09-12 | Disposition: A | Discharge status: Home / Self Care | Payer: Medicare Other | Attending: Emergency Medicine | Admitting: Emergency Medicine

## 2021-09-12 ENCOUNTER — Encounter (HOSPITAL_BASED_OUTPATIENT_CLINIC_OR_DEPARTMENT_OTHER): Payer: Self-pay

## 2021-09-12 ENCOUNTER — Other Ambulatory Visit: Payer: Self-pay

## 2021-09-12 DIAGNOSIS — R000 Tachycardia, unspecified: Secondary | ICD-10-CM | POA: Diagnosis not present

## 2021-09-12 DIAGNOSIS — I2510 Atherosclerotic heart disease of native coronary artery without angina pectoris: Secondary | ICD-10-CM | POA: Insufficient documentation

## 2021-09-12 DIAGNOSIS — I10 Essential (primary) hypertension: Secondary | ICD-10-CM | POA: Diagnosis not present

## 2021-09-12 DIAGNOSIS — Z7982 Long term (current) use of aspirin: Secondary | ICD-10-CM | POA: Insufficient documentation

## 2021-09-12 DIAGNOSIS — R509 Fever, unspecified: Secondary | ICD-10-CM | POA: Insufficient documentation

## 2021-09-12 DIAGNOSIS — Z79899 Other long term (current) drug therapy: Secondary | ICD-10-CM | POA: Insufficient documentation

## 2021-09-12 DIAGNOSIS — R110 Nausea: Secondary | ICD-10-CM | POA: Insufficient documentation

## 2021-09-12 DIAGNOSIS — R6889 Other general symptoms and signs: Secondary | ICD-10-CM

## 2021-09-12 DIAGNOSIS — Z20822 Contact with and (suspected) exposure to covid-19: Secondary | ICD-10-CM | POA: Insufficient documentation

## 2021-09-12 LAB — RESP PANEL BY RT-PCR (FLU A&B, COVID) ARPGX2
Influenza A by PCR: NEGATIVE
Influenza B by PCR: NEGATIVE
SARS Coronavirus 2 by RT PCR: NEGATIVE

## 2021-09-12 MED ORDER — DOXYCYCLINE HYCLATE 100 MG PO CAPS: 100.0000 mg | ORAL_CAPSULE | Freq: Two times a day (BID) | ORAL | 0 refills | Status: DC

## 2021-09-12 MED ORDER — ONDANSETRON 4 MG PO TBDP: 4.0000 mg | ORAL_TABLET | Freq: Three times a day (TID) | ORAL | 1 refills | Status: DC | PRN

## 2021-09-12 MED ORDER — ONDANSETRON 4 MG PO TBDP
4.0000 mg | ORAL_TABLET | Freq: Once | ORAL | Status: AC
  Administered 2021-09-12: 4 mg via ORAL
  Filled 2021-09-12: qty 1

## 2021-09-12 NOTE — ED Provider Notes (Signed)
?[facility] ?Provider Note ? ? ?CSN: [id number] ?Arrival date & time: 09/12/21  1120 ? ?  ? ?History ? ?Chief Complaint  ?Patient presents with  ? Fever  ? ? ?Richard Franco is a 56 y.o. male. ? ?Patient had a tick on his left shoulder he is not sure how long it was there.  He removed it a few days ago.  But then realized that the head was still there.  Just remove that recently.  Patient started feeling body aches fever chills joint pain yesterday.  But prior to that was kind to having some nausea without any vomiting.  No diarrhea.  Patient last had any Tylenol more than 6 hours ago.  No fever here.  Repeat temperature check negative.  And no rash. ? ?Patient has a history of chronic pain.  He is on oxycodone on a chronic basis.  Known to have fibromyalgia gastroesophageal reflux disease chronic midline posterior neck pain known history of coronary artery disease non-STEMI in 2014 with a cath no mention of stents.  High cholesterol hypertension chronic pain disorder. ? ? ?  ? ?Home Medications ?Prior to Admission medications   ?Medication Sig Start Date End Date Taking? Authorizing Provider  ?albuterol (VENTOLIN HFA) 108 (90 Base) MCG/ACT inhaler Inhale 1-2 puffs into the lungs every 6 (six) hours as needed for wheezing or shortness of breath. 11/15/20   Parrett, Fonnie Mu, NP  ?amLODipine (NORVASC) 5 MG tablet Take 5 mg by mouth at bedtime.    [provider]  ?aspirin 81 MG EC tablet Take 1 tablet (81 mg total) by mouth daily. Restart on 12/13/20 12/08/20   Judith Part, MD  ?cyclobenzaprine (FLEXERIL) 10 MG tablet Take 1 tablet (10 mg total) by mouth 3 (three) times daily as needed for muscle spasms. 12/08/20   Judith Part, MD  ?EPINEPHrine 0.3 mg/0.3 mL IJ SOAJ injection Inject 0.3 mg into the muscle once. 07/14/19   [provider]  ?esomeprazole (NEXIUM) 40 MG capsule Take 1 capsule (40 mg total) by mouth 2 (two) times daily before a meal. TAKE ONE  CAPSULE BY MOUTH TWICE A DAY BEFORE A MEAL. 12/08/20   Judith Part, MD  ?FLUoxetine (PROZAC) 40 MG capsule Take 40 mg by mouth at bedtime.    [provider]  ?fluticasone furoate-vilanterol (BREO ELLIPTA) 100-25 MCG/INH AEPB Inhale 1 puff into the lungs daily. 11/15/20   Parrett, Fonnie Mu, NP  ?fluticasone furoate-vilanterol (BREO ELLIPTA) 100-25 MCG/INH AEPB Inhale 1 puff into the lungs daily. 11/15/20   Parrett, Fonnie Mu, NP  ?lamoTRIgine (LAMICTAL) 150 MG tablet Take 300 mg by mouth at bedtime.    [provider]  ?nitroGLYCERIN (NITROSTAT) 0.4 MG SL tablet Place 1 tablet (0.4 mg total) under the tongue every 5 (five) minutes as needed for chest pain. 11/13/20   Buford Dresser, MD  ?oxyCODONE (OXYCONTIN) 20 mg 12 hr tablet Take 1 tablet (20 mg total) by mouth every 12 (twelve) hours. 12/13/20   Judith Part, MD  ?oxyCODONE-acetaminophen (PERCOCET) 10-325 MG tablet Take 1 tablet by mouth 4 (four) times daily as needed for pain. 11/22/20   [provider]  ?Propylene Glycol (SYSTANE BALANCE) 0.6 % SOLN Place 1 drop into both eyes 2 (two) times daily as needed (dry eyes).    [provider]  ?QUEtiapine (SEROQUEL) 50 MG tablet Take 50 mg by mouth at bedtime as needed (sleep). 04/16/20   [provider]  ?   ? ?  Allergies    ?Midodrine hcl and Sulfa antibiotics   ? ?Review of Systems   ?Review of Systems  ?Constitutional:  Positive for chills and fever.  ?HENT:  Negative for ear pain and sore throat.   ?Eyes:  Negative for pain and visual disturbance.  ?Respiratory:  Negative for cough and shortness of breath.   ?Cardiovascular:  Negative for chest pain and palpitations.  ?Gastrointestinal:  Positive for nausea. Negative for abdominal pain and vomiting.  ?Genitourinary:  Negative for dysuria and hematuria.  ?Musculoskeletal:  Positive for myalgias. Negative for arthralgias and back pain.  ?Skin:  Negative for color change and rash.  ?Neurological:  Negative  for seizures and syncope.  ?All other systems reviewed and are negative. ? ?Physical Exam ?Updated Vital Signs ?BP 121/77 (BP Location: Right Arm)   Pulse 87   Temp 99.4 ?F (37.4 ?C) (Oral)   Resp 18   Ht 1.727 m ('5\' 8"'$ )   Wt 75.3 kg   SpO2 99%   BMI 25.24 kg/m?  ?Physical Exam ?Vitals and nursing note reviewed.  ?Constitutional:   ?   General: He is not in acute distress. ?   Appearance: Normal appearance. He is well-developed. He is ill-appearing.  ?HENT:  ?   Head: Normocephalic and atraumatic.  ?Eyes:  ?   Conjunctiva/sclera: Conjunctivae normal.  ?   Pupils: Pupils are equal, round, and reactive to light.  ?Cardiovascular:  ?   Rate and Rhythm: Regular rhythm. Tachycardia present.  ?   Heart sounds: No murmur heard. ?Pulmonary:  ?   Effort: Pulmonary effort is normal. No respiratory distress.  ?   Breath sounds: Normal breath sounds.  ?Abdominal:  ?   Palpations: Abdomen is soft.  ?   Tenderness: There is no abdominal tenderness.  ?Musculoskeletal:     ?   General: No swelling.  ?   Cervical back: Normal range of motion and neck supple.  ?Skin: ?   General: Skin is warm and dry.  ?   Capillary Refill: Capillary refill takes less than 2 seconds.  ?Neurological:  ?   General: No focal deficit present.  ?   Mental Status: He is alert and oriented to person, place, and time.  ?   Cranial Nerves: No cranial nerve deficit.  ?   Sensory: No sensory deficit.  ?Psychiatric:     ?   Mood and Affect: Mood normal.  ? ? ?ED Results / Procedures / Treatments   ?Labs ?(all labs ordered are listed, but only abnormal results are displayed) ?Labs Reviewed  ?RESP PANEL BY RT-PCR (FLU A&B, COVID) ARPGX2  ? ? ?EKG ?None ? ?Radiology ?No results found. ? ?Procedures ?Procedures  ? ? ?Medications Ordered in ED ?Medications  ?ondansetron (ZOFRAN-ODT) disintegrating tablet 4 mg (4 mg Oral Given 09/12/21 1418)  ? ? ?ED Course/ Medical Decision Making/ A&P ?  ?                        ?Medical Decision Making ?Risk ?Prescription  drug management. ? ? ?Patient's vital signs here other than a little tachycardia without any significant abnormalities.  Blood pressure 115/86 respirations 18 temp 98.6.  Repeat temp was 1 not consistent with a fever.  Oxygen saturation is 98%. ? ?No evidence of any rash.  Patient's symptoms are somewhat flulike in nature.  No respiratory symptoms.  Had COVID test at home that was negative we will recheck your for flu and COVID.  Also some  potential risk due to the tick bite.  Which can give you a flulike illness.  So patient will definitely be covered with doxycycline. ? ?Patient's COVID influenza negative here.  Still flulike illness.  Still some concerns about a tickborne illness.  We will also add on Zofran to his prescriptions.  He wants to pick them up at the [facility] at [facility].  ? ?Patient is followed by pain management.  Patient is on oxycodone on a regular basis.  Just received a 30-day supply on March 15. ? ? ?Final Clinical Impression(s) / ED Diagnoses ?Final diagnoses:  ?Flu-like symptoms  ? ? ?Rx / DC Orders ?ED Discharge Orders   ? ? None  ? ?  ? ? ?  ?Fredia Sorrow, MD ?09/12/21 1419 ? ?

## 2021-09-12 NOTE — Discharge Instructions (Addendum)
Take the doxycycline as directed for the next 10 days.  This is to cover you in case of a tickborne illness.  Also take the Zofran as needed for nausea vomiting.  Otherwise over-the-counter cold and flu type medicines.  Return for any new or worse symptoms.  Make an appointment follow-up with your doctors.  Continue take your oxycodone as directed. ?

## 2021-09-12 NOTE — ED Triage Notes (Addendum)
Pt c/o fever, chills, joint pain started yesterday-states sx started 7 days after removing a tick-NAD-steady gait-reports covid neg test x 2 ?

## 2021-09-16 ENCOUNTER — Emergency Department (HOSPITAL_BASED_OUTPATIENT_CLINIC_OR_DEPARTMENT_OTHER): Payer: Medicare Other

## 2021-09-16 ENCOUNTER — Other Ambulatory Visit: Payer: Self-pay

## 2021-09-16 ENCOUNTER — Encounter (HOSPITAL_BASED_OUTPATIENT_CLINIC_OR_DEPARTMENT_OTHER): Payer: Self-pay

## 2021-09-16 ENCOUNTER — Observation Stay (HOSPITAL_BASED_OUTPATIENT_CLINIC_OR_DEPARTMENT_OTHER)
Admission: EM | Admit: 2021-09-16 | Discharge: 2021-09-17 | Disposition: A | Discharge status: Home / Self Care | Payer: Medicare Other | Attending: Internal Medicine | Admitting: Internal Medicine

## 2021-09-16 DIAGNOSIS — S20362S Insect bite (nonvenomous) of left front wall of thorax, sequela: Secondary | ICD-10-CM | POA: Diagnosis not present

## 2021-09-16 DIAGNOSIS — N179 Acute kidney failure, unspecified: Principal | ICD-10-CM | POA: Diagnosis present

## 2021-09-16 DIAGNOSIS — W57XXXS Bitten or stung by nonvenomous insect and other nonvenomous arthropods, sequela: Secondary | ICD-10-CM | POA: Diagnosis not present

## 2021-09-16 DIAGNOSIS — I2510 Atherosclerotic heart disease of native coronary artery without angina pectoris: Secondary | ICD-10-CM | POA: Insufficient documentation

## 2021-09-16 DIAGNOSIS — R112 Nausea with vomiting, unspecified: Secondary | ICD-10-CM

## 2021-09-16 DIAGNOSIS — D61818 Other pancytopenia: Secondary | ICD-10-CM | POA: Diagnosis not present

## 2021-09-16 DIAGNOSIS — D7589 Other specified diseases of blood and blood-forming organs: Secondary | ICD-10-CM

## 2021-09-16 DIAGNOSIS — Z79899 Other long term (current) drug therapy: Secondary | ICD-10-CM | POA: Diagnosis not present

## 2021-09-16 DIAGNOSIS — I10 Essential (primary) hypertension: Secondary | ICD-10-CM | POA: Diagnosis present

## 2021-09-16 DIAGNOSIS — Z7982 Long term (current) use of aspirin: Secondary | ICD-10-CM | POA: Insufficient documentation

## 2021-09-16 DIAGNOSIS — G8929 Other chronic pain: Secondary | ICD-10-CM | POA: Diagnosis not present

## 2021-09-16 DIAGNOSIS — Z87891 Personal history of nicotine dependence: Secondary | ICD-10-CM | POA: Insufficient documentation

## 2021-09-16 DIAGNOSIS — E860 Dehydration: Secondary | ICD-10-CM | POA: Insufficient documentation

## 2021-09-16 DIAGNOSIS — S40262S Insect bite (nonvenomous) of left shoulder, sequela: Secondary | ICD-10-CM | POA: Diagnosis not present

## 2021-09-16 DIAGNOSIS — R7989 Other specified abnormal findings of blood chemistry: Secondary | ICD-10-CM

## 2021-09-16 DIAGNOSIS — K219 Gastro-esophageal reflux disease without esophagitis: Secondary | ICD-10-CM | POA: Diagnosis present

## 2021-09-16 DIAGNOSIS — D72819 Decreased white blood cell count, unspecified: Secondary | ICD-10-CM | POA: Insufficient documentation

## 2021-09-16 DIAGNOSIS — R7401 Elevation of levels of liver transaminase levels: Secondary | ICD-10-CM | POA: Diagnosis not present

## 2021-09-16 DIAGNOSIS — E876 Hypokalemia: Secondary | ICD-10-CM | POA: Diagnosis not present

## 2021-09-16 DIAGNOSIS — I214 Non-ST elevation (NSTEMI) myocardial infarction: Secondary | ICD-10-CM | POA: Insufficient documentation

## 2021-09-16 LAB — LACTIC ACID, PLASMA: Lactic Acid, Venous: 1.2 mmol/L (ref 0.5–1.9)

## 2021-09-16 LAB — URINALYSIS, ROUTINE W REFLEX MICROSCOPIC
Bilirubin Urine: NEGATIVE
Glucose, UA: NEGATIVE mg/dL
Hgb urine dipstick: NEGATIVE
Ketones, ur: NEGATIVE mg/dL
Leukocytes,Ua: NEGATIVE
Nitrite: NEGATIVE
Protein, ur: 100 mg/dL — AB
Specific Gravity, Urine: >1.03 — ABNORMAL HIGH (ref 1.005–1.030)
pH: 6 (ref 5.0–8.0)

## 2021-09-16 LAB — LIPASE, BLOOD: Lipase: 48 U/L (ref 11–51)

## 2021-09-16 LAB — CBC WITH DIFFERENTIAL/PLATELET
Abs Immature Granulocytes: 0 10*3/uL (ref 0.00–0.07)
Basophils Absolute: 0 10*3/uL (ref 0.0–0.1)
Basophils Relative: 0 %
Eosinophils Absolute: 0 10*3/uL (ref 0.0–0.5)
Eosinophils Relative: 0 %
HCT: 44.6 % (ref 39.0–52.0)
Hemoglobin: 15.6 g/dL (ref 13.0–17.0)
Lymphocytes Relative: 37 %
Lymphs Abs: 0.7 10*3/uL (ref 0.7–4.0)
MCH: 29.8 pg (ref 26.0–34.0)
MCHC: 35 g/dL (ref 30.0–36.0)
MCV: 85.1 fL (ref 80.0–100.0)
Monocytes Absolute: 0.2 10*3/uL (ref 0.1–1.0)
Monocytes Relative: 12 %
Neutro Abs: 0.9 10*3/uL — ABNORMAL LOW (ref 1.7–7.7)
Neutrophils Relative %: 51 %
Platelets: 124 10*3/uL — ABNORMAL LOW (ref 150–400)
RBC: 5.24 MIL/uL (ref 4.22–5.81)
RDW: 12.9 % (ref 11.5–15.5)
WBC: 1.8 10*3/uL — ABNORMAL LOW (ref 4.0–10.5)
nRBC: 0 % (ref 0.0–0.2)

## 2021-09-16 LAB — COMPREHENSIVE METABOLIC PANEL
ALT: 36 U/L (ref 0–44)
AST: 50 U/L — ABNORMAL HIGH (ref 15–41)
Albumin: 4.5 g/dL (ref 3.5–5.0)
Alkaline Phosphatase: 90 U/L (ref 38–126)
Anion gap: 9 (ref 5–15)
BUN: 18 mg/dL (ref 6–20)
CO2: 25 mmol/L (ref 22–32)
Calcium: 9.5 mg/dL (ref 8.9–10.3)
Chloride: 102 mmol/L (ref 98–111)
Creatinine, Ser: 1.43 mg/dL — ABNORMAL HIGH (ref 0.61–1.24)
GFR, Estimated: 58 mL/min — ABNORMAL LOW (ref >60)
Glucose, Bld: 100 mg/dL — ABNORMAL HIGH (ref 70–99)
Potassium: 3.3 mmol/L — ABNORMAL LOW (ref 3.5–5.1)
Sodium: 136 mmol/L (ref 135–145)
Total Bilirubin: 0.6 mg/dL (ref 0.3–1.2)
Total Protein: 7.9 g/dL (ref 6.5–8.1)

## 2021-09-16 LAB — RAPID URINE DRUG SCREEN, HOSP PERFORMED
Amphetamines: NOT DETECTED
Barbiturates: NOT DETECTED
Benzodiazepines: NOT DETECTED
Cocaine: NOT DETECTED
Opiates: POSITIVE — AB
Tetrahydrocannabinol: NOT DETECTED

## 2021-09-16 LAB — URINALYSIS, MICROSCOPIC (REFLEX): Bacteria, UA: NONE SEEN

## 2021-09-16 MED ORDER — ONDANSETRON HCL 4 MG/2ML IJ SOLN
4.0000 mg | Freq: Four times a day (QID) | INTRAMUSCULAR | Status: DC
  Administered 2021-09-16 – 2021-09-17 (×3): 4 mg via INTRAVENOUS
  Filled 2021-09-16 (×3): qty 2

## 2021-09-16 MED ORDER — METOCLOPRAMIDE HCL 5 MG/ML IJ SOLN
10.0000 mg | Freq: Four times a day (QID) | INTRAMUSCULAR | Status: DC | PRN
  Administered 2021-09-16 – 2021-09-17 (×2): 10 mg via INTRAVENOUS
  Filled 2021-09-16 (×2): qty 2

## 2021-09-16 MED ORDER — SODIUM CHLORIDE 0.9 % IV SOLN
100.0000 mg | Freq: Once | INTRAVENOUS | Status: AC
  Administered 2021-09-16: 100 mg via INTRAVENOUS
  Filled 2021-09-16: qty 100

## 2021-09-16 MED ORDER — ALBUTEROL SULFATE (2.5 MG/3ML) 0.083% IN NEBU: 3.0000 mL | INHALATION_SOLUTION | Freq: Four times a day (QID) | RESPIRATORY_TRACT | Status: DC | PRN

## 2021-09-16 MED ORDER — NITROGLYCERIN 0.4 MG SL SUBL: 0.4000 mg | SUBLINGUAL_TABLET | SUBLINGUAL | Status: DC | PRN

## 2021-09-16 MED ORDER — SODIUM CHLORIDE 0.9 % IV SOLN
100.0000 mg | Freq: Two times a day (BID) | INTRAVENOUS | Status: DC
  Administered 2021-09-16 – 2021-09-17 (×2): 100 mg via INTRAVENOUS
  Filled 2021-09-16 (×3): qty 100

## 2021-09-16 MED ORDER — ASPIRIN EC 81 MG PO TBEC
81.0000 mg | DELAYED_RELEASE_TABLET | Freq: Every day | ORAL | Status: DC
  Administered 2021-09-17: 81 mg via ORAL
  Filled 2021-09-16: qty 1

## 2021-09-16 MED ORDER — CYCLOBENZAPRINE HCL 10 MG PO TABS: 10.0000 mg | ORAL_TABLET | Freq: Three times a day (TID) | ORAL | Status: DC | PRN

## 2021-09-16 MED ORDER — SENNOSIDES-DOCUSATE SODIUM 8.6-50 MG PO TABS: 1.0000 | ORAL_TABLET | Freq: Every evening | ORAL | Status: DC | PRN

## 2021-09-16 MED ORDER — ACETAMINOPHEN 325 MG PO TABS: 650.0000 mg | ORAL_TABLET | Freq: Four times a day (QID) | ORAL | Status: DC | PRN

## 2021-09-16 MED ORDER — OXYCODONE HCL 5 MG PO TABS
5.0000 mg | ORAL_TABLET | Freq: Four times a day (QID) | ORAL | Status: DC | PRN
  Administered 2021-09-16: 10 mg via ORAL
  Administered 2021-09-17: 5 mg via ORAL
  Filled 2021-09-16: qty 1
  Filled 2021-09-16: qty 2

## 2021-09-16 MED ORDER — SODIUM CHLORIDE 0.9 % IV BOLUS
1000.0000 mL | Freq: Once | INTRAVENOUS | Status: AC
  Administered 2021-09-16: 1000 mL via INTRAVENOUS

## 2021-09-16 MED ORDER — ENOXAPARIN SODIUM 40 MG/0.4ML IJ SOSY: 40.0000 mg | PREFILLED_SYRINGE | INTRAMUSCULAR | Status: DC

## 2021-09-16 MED ORDER — MORPHINE SULFATE (PF) 2 MG/ML IV SOLN
2.0000 mg | INTRAVENOUS | Status: DC | PRN
  Administered 2021-09-16 – 2021-09-17 (×2): 2 mg via INTRAVENOUS
  Filled 2021-09-16 (×2): qty 1

## 2021-09-16 MED ORDER — SODIUM CHLORIDE 0.9 % IV SOLN: INTRAVENOUS | Status: DC

## 2021-09-16 MED ORDER — ACETAMINOPHEN 650 MG RE SUPP
650.0000 mg | Freq: Four times a day (QID) | RECTAL | Status: DC | PRN
Start: 2021-09-16 — End: 2021-09-17

## 2021-09-16 MED ORDER — OXYCODONE-ACETAMINOPHEN 5-325 MG PO TABS
1.0000 | ORAL_TABLET | Freq: Four times a day (QID) | ORAL | Status: DC | PRN
  Administered 2021-09-17: 1 via ORAL
  Filled 2021-09-16: qty 1

## 2021-09-16 MED ORDER — OXYCODONE-ACETAMINOPHEN 5-325 MG PO TABS: 1.0000 | ORAL_TABLET | Freq: Four times a day (QID) | ORAL | Status: DC | PRN

## 2021-09-16 MED ORDER — OXYCODONE-ACETAMINOPHEN 5-325 MG PO TABS
2.0000 | ORAL_TABLET | Freq: Once | ORAL | Status: AC
  Administered 2021-09-16: 2 via ORAL
  Filled 2021-09-16: qty 2

## 2021-09-16 MED ORDER — OXYCODONE-ACETAMINOPHEN 10-325 MG PO TABS: 1.0000 | ORAL_TABLET | Freq: Four times a day (QID) | ORAL | Status: DC | PRN

## 2021-09-16 MED ORDER — FLUTICASONE FUROATE-VILANTEROL 100-25 MCG/ACT IN AEPB
1.0000 | INHALATION_SPRAY | Freq: Every day | RESPIRATORY_TRACT | Status: DC
  Filled 2021-09-16: qty 28

## 2021-09-16 MED ORDER — POLYVINYL ALCOHOL 1.4 % OP SOLN: 1.0000 [drp] | OPHTHALMIC | Status: DC | PRN

## 2021-09-16 MED ORDER — LAMOTRIGINE 100 MG PO TABS
300.0000 mg | ORAL_TABLET | Freq: Every day | ORAL | Status: DC
Start: 2021-09-16 — End: 2021-09-16

## 2021-09-16 MED ORDER — POTASSIUM CHLORIDE IN NACL 40-0.9 MEQ/L-% IV SOLN
INTRAVENOUS | Status: DC
  Filled 2021-09-16 (×3): qty 1000

## 2021-09-16 MED ORDER — PROPYLENE GLYCOL 0.6 % OP SOLN: 1.0000 [drp] | Freq: Two times a day (BID) | OPHTHALMIC | Status: DC | PRN

## 2021-09-16 MED ORDER — LAMOTRIGINE 100 MG PO TABS
400.0000 mg | ORAL_TABLET | Freq: Every day | ORAL | Status: DC
  Administered 2021-09-16: 400 mg via ORAL
  Filled 2021-09-16: qty 4

## 2021-09-16 MED ORDER — AMLODIPINE BESYLATE 5 MG PO TABS: 5.0000 mg | ORAL_TABLET | Freq: Every day | ORAL | Status: DC

## 2021-09-16 MED ORDER — MECLIZINE HCL 25 MG PO TABS: 25.0000 mg | ORAL_TABLET | Freq: Three times a day (TID) | ORAL | Status: DC | PRN

## 2021-09-16 MED ORDER — FLUOXETINE HCL 20 MG PO CAPS
40.0000 mg | ORAL_CAPSULE | Freq: Every day | ORAL | Status: DC
  Administered 2021-09-16: 40 mg via ORAL
  Filled 2021-09-16: qty 2

## 2021-09-16 MED ORDER — ONDANSETRON HCL 4 MG/2ML IJ SOLN
4.0000 mg | Freq: Once | INTRAMUSCULAR | Status: AC
  Administered 2021-09-16: 4 mg via INTRAVENOUS
  Filled 2021-09-16: qty 2

## 2021-09-16 MED ORDER — OXYCODONE HCL 5 MG PO TABS: 5.0000 mg | ORAL_TABLET | Freq: Four times a day (QID) | ORAL | Status: DC | PRN

## 2021-09-16 MED ORDER — PANTOPRAZOLE SODIUM 40 MG IV SOLR
40.0000 mg | Freq: Two times a day (BID) | INTRAVENOUS | Status: DC
  Administered 2021-09-16 – 2021-09-17 (×2): 40 mg via INTRAVENOUS
  Filled 2021-09-16 (×2): qty 10

## 2021-09-16 MED ORDER — TIZANIDINE HCL 4 MG PO TABS: 4.0000 mg | ORAL_TABLET | Freq: Four times a day (QID) | ORAL | Status: DC | PRN

## 2021-09-16 NOTE — ED Notes (Signed)
Pt requests something for a HA; sts he usually takes Percocet for pain ?

## 2021-09-16 NOTE — ED Triage Notes (Signed)
Pt was seen here Wednesday. Had a tick bite 10 days ago. Unable to eat/drink anything since Tuesday.  ?

## 2021-09-16 NOTE — ED Provider Notes (Signed)
?[facility] DEPART[facility] ?Provider Note ? ? ?CSN: [id number] ?Arrival date & time: 09/16/21  6063 ? ?  ? ?History ? ?Chief Complaint  ?Patient presents with  ? Vomiting  ? ? ?Richard Franco is a 56 y.o. male. ? ?HPI ? ?  ?Patient has history of fibromyalgia, depression, GERD, anxiety, migraines, chronic neck pain, coronary disease, hypercholesterolemia, chronic pain disorder.  Patient states he started having flulike symptoms last week.  This was preceded by a tick bite.  He began having body aches fevers joint pain.  He was having some mild nausea without vomiting.  No diarrhea.  He went to the emergency room on March 22 and was evaluated.  Those records were reviewed.  Patient had negative COVID flu test.  No rash was noted at that time.  Patient was empirically treated with doxycycline and Zofran.  Patient presents back to the ED because he has had persistent issues primarily now with nausea vomiting.  Whenever he tries to take the doxycycline he throws up.  He has had continues nausea with episodes of vomiting and has not been able to eat or drink properly in several days.  He has not noticed any rash.  He has not measured any fevers.  He does feel some soreness in his mouth now. ?Home Medications ?Prior to Admission medications   ?Medication Sig Start Date End Date Taking? Authorizing Provider  ?albuterol (VENTOLIN HFA) 108 (90 Base) MCG/ACT inhaler Inhale 1-2 puffs into the lungs every 6 (six) hours as needed for wheezing or shortness of breath. 11/15/20   Parrett, Fonnie Mu, NP  ?amLODipine (NORVASC) 5 MG tablet Take 5 mg by mouth at bedtime.    [provider]  ?aspirin 81 MG EC tablet Take 1 tablet (81 mg total) by mouth daily. Restart on 12/13/20 12/08/20   Judith Part, MD  ?cyclobenzaprine (FLEXERIL) 10 MG tablet Take 1 tablet (10 mg total) by mouth 3 (three) times daily as needed for muscle spasms. 12/08/20   Judith Part, MD  ?doxycycline (VIBRAMYCIN) 100 MG capsule  Take 1 capsule (100 mg total) by mouth 2 (two) times daily. 09/12/21   Fredia Sorrow, MD  ?EPINEPHrine 0.3 mg/0.3 mL IJ SOAJ injection Inject 0.3 mg into the muscle once. 07/14/19   [provider]  ?esomeprazole (NEXIUM) 40 MG capsule Take 1 capsule (40 mg total) by mouth 2 (two) times daily before a meal. TAKE ONE CAPSULE BY MOUTH TWICE A DAY BEFORE A MEAL. 12/08/20   Judith Part, MD  ?FLUoxetine (PROZAC) 40 MG capsule Take 40 mg by mouth at bedtime.    [provider]  ?fluticasone furoate-vilanterol (BREO ELLIPTA) 100-25 MCG/INH AEPB Inhale 1 puff into the lungs daily. 11/15/20   Parrett, Fonnie Mu, NP  ?fluticasone furoate-vilanterol (BREO ELLIPTA) 100-25 MCG/INH AEPB Inhale 1 puff into the lungs daily. 11/15/20   Parrett, Fonnie Mu, NP  ?lamoTRIgine (LAMICTAL) 150 MG tablet Take 300 mg by mouth at bedtime.    [provider]  ?nitroGLYCERIN (NITROSTAT) 0.4 MG SL tablet Place 1 tablet (0.4 mg total) under the tongue every 5 (five) minutes as needed for chest pain. 11/13/20   Buford Dresser, MD  ?ondansetron (ZOFRAN-ODT) 4 MG disintegrating tablet Take 1 tablet (4 mg total) by mouth every 8 (eight) hours as needed for nausea or vomiting. 09/12/21   Fredia Sorrow, MD  ?oxyCODONE (OXYCONTIN) 20 mg 12 hr tablet Take 1 tablet (20 mg total) by mouth every 12 (twelve) hours. 12/13/20  Judith Part, MD  ?oxyCODONE-acetaminophen (PERCOCET) 10-325 MG tablet Take 1 tablet by mouth 4 (four) times daily as needed for pain. 11/22/20   [provider]  ?Propylene Glycol (SYSTANE BALANCE) 0.6 % SOLN Place 1 drop into both eyes 2 (two) times daily as needed (dry eyes).    [provider]  ?QUEtiapine (SEROQUEL) 50 MG tablet Take 50 mg by mouth at bedtime as needed (sleep). 04/16/20   [provider]  ?   ? ?Allergies    ?Midodrine hcl and Sulfa antibiotics   ? ?Review of Systems   ?Review of Systems  ?Respiratory:  Negative for shortness of breath.    ?Cardiovascular:  Negative for chest pain.  ? ?Physical Exam ?Updated Vital Signs ?BP 118/77   Pulse 68   Temp 97.7 ?F (36.5 ?C) (Oral)   Resp 14   Ht 1.727 m ('5\' 8"'$ )   Wt 75 kg   SpO2 98%   BMI 25.13 kg/m?  ?Physical Exam ?Vitals and nursing note reviewed.  ?Constitutional:   ?   General: He is not in acute distress. ?   Appearance: He is well-developed.  ?HENT:  ?   Head: Normocephalic and atraumatic.  ?   Right Ear: External ear normal.  ?   Left Ear: External ear normal.  ?   Mouth/Throat:  ?   Mouth: Mucous membranes are moist.  ?   Comments: No lesions noted in the oropharynx ?Eyes:  ?   General: No scleral icterus.    ?   Right eye: No discharge.     ?   Left eye: No discharge.  ?   Conjunctiva/sclera: Conjunctivae normal.  ?Neck:  ?   Trachea: No tracheal deviation.  ?Cardiovascular:  ?   Rate and Rhythm: Normal rate and regular rhythm.  ?Pulmonary:  ?   Effort: Pulmonary effort is normal. No respiratory distress.  ?   Breath sounds: Normal breath sounds. No stridor. No wheezing or rales.  ?Abdominal:  ?   General: Bowel sounds are normal. There is no distension.  ?   Palpations: Abdomen is soft.  ?   Tenderness: There is no abdominal tenderness. There is no guarding or rebound.  ?Musculoskeletal:     ?   General: No tenderness or deformity.  ?   Cervical back: Neck supple.  ?Skin: ?   General: Skin is warm and dry.  ?   Findings: No rash.  ?   Comments: No evidence of petechiae or purpura  ?Neurological:  ?   General: No focal deficit present.  ?   Mental Status: He is alert.  ?   Cranial Nerves: No cranial nerve deficit (no facial droop, extraocular movements intact, no slurred speech).  ?   Sensory: No sensory deficit.  ?   Motor: No abnormal muscle tone or seizure activity.  ?   Coordination: Coordination normal.  ?Psychiatric:     ?   Mood and Affect: Mood normal.  ? ? ?ED Results / Procedures / Treatments   ?Labs ?(all labs ordered are listed, but only abnormal results are displayed) ?Labs  Reviewed  ?COMPREHENSIVE METABOLIC PANEL - Abnormal; Notable for the following components:  ?    Result Value  ? Potassium 3.3 (*)   ? Glucose, Bld 100 (*)   ? Creatinine, Ser 1.43 (*)   ? AST 50 (*)   ? GFR, Estimated 58 (*)   ? All other components within normal limits  ?CBC WITH DIFFERENTIAL/PLATELET - Abnormal;  Notable for the following components:  ? WBC 1.8 (*)   ? Platelets 124 (*)   ? Neutro Abs 0.9 (*)   ? All other components within normal limits  ?CULTURE, BLOOD (ROUTINE X 2)  ?CULTURE, BLOOD (ROUTINE X 2)  ?LIPASE, BLOOD  ?LACTIC ACID, PLASMA  ?URINALYSIS, ROUTINE W REFLEX MICROSCOPIC  ?ROCKY MTN SPOTTED FVR ABS PNL(IGG+IGM)  ?PATHOLOGIST SMEAR REVIEW  ? ? ?EKG ?None ? ?Radiology ?DG Abd Acute W/Chest ? ?Result Date: 09/16/2021 ?CLINICAL DATA:  Nausea, vomiting, tick bite 10 days ago EXAM: DG ABDOMEN ACUTE WITH 1 VIEW CHEST COMPARISON:  KUB 01/04/2013 chest radiograph 10/26/2020 FINDINGS: Chest: The cardiomediastinal silhouette is normal. There is no focal consolidation or pulmonary edema. There is no pleural effusion or pneumothorax. Abdomen: There is a nonobstructive bowel gas pattern. There is no free intraperitoneal air. There is no gross organomegaly or abnormal soft tissue calcification. Cervical spine fusion hardware is partially imaged. The bones are otherwise unremarkable. IMPRESSION: Negative abdominal radiographs.  No acute cardiopulmonary disease. Electronically Signed   By: Valetta Mole M.D.   On: 09/16/2021 11:16   ? ?Procedures ?Procedures  ? ? ?Medications Ordered in ED ?Medications  ?sodium chloride 0.9 % bolus 1,000 mL (0 mLs Intravenous Stopped 09/16/21 1225)  ?  And  ?0.9 %  sodium chloride infusion ( Intravenous New Bag/Given 09/16/21 1326)  ?doxycycline (VIBRAMYCIN) 100 mg in sodium chloride 0.9 % 250 mL IVPB (100 mg Intravenous New Bag/Given 09/16/21 1326)  ?ondansetron (ZOFRAN) injection 4 mg (4 mg Intravenous Given 09/16/21 1043)  ? ? ?ED Course/ Medical Decision Making/ A&P ?Clinical  Course as of 09/16/21 1432  ?Sun Sep 16, 2021  ?1133 CBC with Diff(!) ?White blood cell count decreased to 1.8, ANC is 900 [JK]  ?1308 Acute abdominal series negative [JK]  ?1316 Lactic acid, plasma ?Lactic acid level n

## 2021-09-16 NOTE — Progress Notes (Signed)
Pt complains of hunger pains, girlfriend brought pt onion soup to eat. Instructed pt of clear liquid diet and given chicken broth and sprite to drink. ?

## 2021-09-16 NOTE — ED Notes (Signed)
ED Provider at bedside. 

## 2021-09-16 NOTE — H&P (Addendum)
History and Physical    Richard Franco UEA:[id number] DOB: [birth year] DOA: 09/16/2021  PCP: Pcp, No   Patient coming from: Home  I have personally briefly reviewed patient's old medical records in [facility]  Chief Complaint: Vomiting  HPI: Richard Franco is a 56 y.o. male with medical history significant of fibromyalgia, depression, GERD, anxiety, migraines, chronic pain, hypercholesterolemia, chronic chest pain, hypertension presented with worsening vomiting.  Patient states that he has been sick for more than a week with intermittent nausea and progressively worsening vomiting.  He apparently had a tick bite more than 2 weeks ago; had removed a tick from his left shoulder/chest wall area.  He subsequently started having body aches, fevers, joint pain with some nausea without vomiting or diarrhea.  He went to the ED on 09/12/2021: Had a negative flu test similar no rash was noted at the time.  He was empirically started on doxycycline and Zofran.  He still has not noted any rash.  His vomiting has progressively gotten worse.  He denies any worsening abdominal pain.  He has not eaten solid food for some many days now.  He denies any fever, chills, chest pain, worsening shortness of breath, loss of consciousness, seizures, diarrhea, dysuria.  He complains of some soreness in his mouth.  ED Course: He was found to have creatinine of 1.43, AST 50, potassium 3.3, WBC of 1.8, platelets of 124.  He was given IV fluids, IV doxycycline and antiemetics.  RMSF titers were sent.  Hospitalist service was called to evaluate the patient.  Review of Systems: As per HPI otherwise all other systems were reviewed and are negative.   Past Medical History:  Diagnosis Date   Abnormal chest CT 03/2012   a. 2013: Scattered patchy ground glass opacities and pulmonary nodules. Transbronchial biopsy with no definite etiology of lung finding- possible organizing pneumonia. b. F/u CT 05/2014: stable multiple  tiny pulm nodules, no further w/u recommended per notes.    Anxiety    Arthritis    CAD (coronary artery disease)    Chronic midline posterior neck pain    Chronic pain disorder    Complication of anesthesia    " difficult to urinate after "   Depression    Esophageal stricture    Fibromyalgia    Fracture    back   GERD (gastroesophageal reflux disease)    Hypercholesteremia    Hypertension    Migraine headache    "none lately; I've had alot in my life" (06/21/2015)   NSTEMI (non-ST elevated myocardial infarction) (HCC) 07/2012   a. Normal cath 03/2012. b. 07/2012: troponin 4, normal cors, ?vasospasm. Did not tolerate Imdur due to headache. On amlodipine.   Testosterone deficiency    Tobacco abuse     Past Surgical History:  Procedure Laterality Date   ANKLE FUSION Left 2006   BACK SURGERY     CERVICAL FUSION  X7 "last one 04/2011"   COLONOSCOPY     CYSTECTOMY     FRACTURE SURGERY     LEFT HEART CATHETERIZATION WITH CORONARY ANGIOGRAM N/A 03/25/2012   Procedure: LEFT HEART CATHETERIZATION WITH CORONARY ANGIOGRAM;  Surgeon: Kathleene Hazel, MD;  Location: [facility] CATH LAB;  Service: Cardiovascular;  Laterality: N/A;   LEFT HEART CATHETERIZATION WITH CORONARY ANGIOGRAM N/A 08/11/2012   Procedure: LEFT HEART CATHETERIZATION WITH CORONARY ANGIOGRAM;  Surgeon: Peter M Swaziland, MD;  Location: [facility] CATH LAB;  Service: Cardiovascular;  Laterality: N/A;   POSTERIOR CERVICAL FUSION/FORAMINOTOMY  05/09/2011  Procedure: POSTERIOR CERVICAL FUSION/FORAMINOTOMY LEVEL 1;  Surgeon: Hewitt Shorts;  Location: [facility] NEURO ORS;  Service: Neurosurgery;  Laterality: N/A;  C4/5 posterior arthrodesis with instrumentation    POSTERIOR CERVICAL FUSION/FORAMINOTOMY N/A 12/06/2020   Procedure: Cervical two Ganglionectomy with extension of fusion from Cervical one to Cervical five;  Surgeon: Jadene Pierini, MD;  Location: [facility] OR;  Service: Neurosurgery;  Laterality: N/A;   UPPER GASTROINTESTINAL  ENDOSCOPY     VIDEO BRONCHOSCOPY  05/06/2012   Procedure: VIDEO BRONCHOSCOPY WITH FLUORO;  Surgeon: Roxine Caddy, MD;  Location: [facility] ENDOSCOPY;  Service: Cardiopulmonary;  Laterality: Bilateral;     reports that he quit smoking about 3 years ago. His smoking use included cigarettes. He has a 30.00 pack-year smoking history. He has quit using smokeless tobacco.  His smokeless tobacco use included chew. He reports that he does not currently use alcohol after a past usage of about 21.0 standard drinks per week. He reports that he does not use drugs.  Allergies  Allergen Reactions   Midodrine Hcl Swelling    Tongue swelling   Sulfa Antibiotics Rash    Break outs Break outs    Family History  Problem Relation Age of Onset   Diabetes Mother    Cancer Mother        ?   Hypertension Mother    Hyperlipidemia Father    Diabetes Father    Coronary artery disease Paternal Uncle    Asthma Sister    Diabetes Brother    Hypertension Brother    Colon cancer Neg Hx    Colon polyps Neg Hx    Rectal cancer Neg Hx    Stomach cancer Neg Hx     Prior to Admission medications   Medication Sig Start Date End Date Taking? Authorizing Provider  albuterol (VENTOLIN HFA) 108 (90 Base) MCG/ACT inhaler Inhale 1-2 puffs into the lungs every 6 (six) hours as needed for wheezing or shortness of breath. 11/15/20   Parrett, Tammy S, NP  amLODipine (NORVASC) 5 MG tablet Take 5 mg by mouth at bedtime.    [provider]  aspirin 81 MG EC tablet Take 1 tablet (81 mg total) by mouth daily. Restart on 12/13/20 12/08/20   Jadene Pierini, MD  cyclobenzaprine (FLEXERIL) 10 MG tablet Take 1 tablet (10 mg total) by mouth 3 (three) times daily as needed for muscle spasms. 12/08/20   Jadene Pierini, MD  doxycycline (VIBRAMYCIN) 100 MG capsule Take 1 capsule (100 mg total) by mouth 2 (two) times daily. 09/12/21   Vanetta Mulders, MD  EPINEPHrine 0.3 mg/0.3 mL IJ SOAJ injection Inject 0.3 mg into the muscle  once. 07/14/19   [provider]  esomeprazole (NEXIUM) 40 MG capsule Take 1 capsule (40 mg total) by mouth 2 (two) times daily before a meal. TAKE ONE CAPSULE BY MOUTH TWICE A DAY BEFORE A MEAL. 12/08/20   Jadene Pierini, MD  FLUoxetine (PROZAC) 40 MG capsule Take 40 mg by mouth at bedtime.    [provider]  fluticasone furoate-vilanterol (BREO ELLIPTA) 100-25 MCG/INH AEPB Inhale 1 puff into the lungs daily. 11/15/20   Parrett, Tammy S, NP  fluticasone furoate-vilanterol (BREO ELLIPTA) 100-25 MCG/INH AEPB Inhale 1 puff into the lungs daily. 11/15/20   Parrett, Virgel Bouquet, NP  lamoTRIgine (LAMICTAL) 150 MG tablet Take 300 mg by mouth at bedtime.    [provider]  nitroGLYCERIN (NITROSTAT) 0.4 MG SL tablet Place 1 tablet (0.4 mg total) under the  tongue every 5 (five) minutes as needed for chest pain. 11/13/20   Jodelle Red, MD  ondansetron (ZOFRAN-ODT) 4 MG disintegrating tablet Take 1 tablet (4 mg total) by mouth every 8 (eight) hours as needed for nausea or vomiting. 09/12/21   Vanetta Mulders, MD  oxyCODONE (OXYCONTIN) 20 mg 12 hr tablet Take 1 tablet (20 mg total) by mouth every 12 (twelve) hours. 12/13/20   Jadene Pierini, MD  oxyCODONE-acetaminophen (PERCOCET) 10-325 MG tablet Take 1 tablet by mouth 4 (four) times daily as needed for pain. 11/22/20   [provider]  Propylene Glycol (SYSTANE BALANCE) 0.6 % SOLN Place 1 drop into both eyes 2 (two) times daily as needed (dry eyes).    [provider]  QUEtiapine (SEROQUEL) 50 MG tablet Take 50 mg by mouth at bedtime as needed (sleep). 04/16/20   [provider]    Physical Exam: Vitals:   09/16/21 1004 09/16/21 1100 09/16/21 1348 09/16/21 1637  BP: (!) 125/91 124/88 118/77 (!) 145/81  Pulse: 96 74 68 86  Resp: 17 15 14 15   Temp: 97.7 F (36.5 C)   98.8 F (37.1 C)  TempSrc: Oral   Oral  SpO2: 100% 99% 98% 97%  Weight:      Height:        Constitutional: NAD, calm,  comfortable Vitals:   09/16/21 1004 09/16/21 1100 09/16/21 1348 09/16/21 1637  BP: (!) 125/91 124/88 118/77 (!) 145/81  Pulse: 96 74 68 86  Resp: 17 15 14 15   Temp: 97.7 F (36.5 C)   98.8 F (37.1 C)  TempSrc: Oral   Oral  SpO2: 100% 99% 98% 97%  Weight:      Height:       Eyes: PERRL, lids and conjunctivae normal ENMT: Mucous membranes are dry.  Posterior pharynx clear of any exudate or lesions. Neck: normal, supple, no masses, no thyromegaly Respiratory: bilateral decreased breath sounds at bases, no wheezing, no crackles. Normal respiratory effort. No accessory muscle use.  Cardiovascular: S1 S2 positive, rate controlled. No extremity edema. 2+ pedal pulses.  Abdomen: no tenderness, no masses palpated. No hepatosplenomegaly. Bowel sounds positive.  Musculoskeletal: no clubbing / cyanosis. No joint deformity upper and lower extremities.  Skin: No obvious rashes or petechiae.  No induration.  Tattoos over multiple parts of the body. Neurologic: CN 2-12 grossly intact. Moving extremities. No focal neurologic deficits.  Psychiatric: Affect is mostly flat.  No signs of agitation currently.   Labs on Admission: I have personally reviewed following labs and imaging studies  CBC: Recent Labs  Lab 09/16/21 1041  WBC 1.8*  NEUTROABS 0.9*  HGB 15.6  HCT 44.6  MCV 85.1  PLT 124*   Basic Metabolic Panel: Recent Labs  Lab 09/16/21 1041  NA 136  K 3.3*  CL 102  CO2 25  GLUCOSE 100*  BUN 18  CREATININE 1.43*  CALCIUM 9.5   GFR: Estimated Creatinine Clearance: 56.5 mL/min (A) (by C-G formula based on SCr of 1.43 mg/dL (H)). Liver Function Tests: Recent Labs  Lab 09/16/21 1041  AST 50*  ALT 36  ALKPHOS 90  BILITOT 0.6  PROT 7.9  ALBUMIN 4.5   Recent Labs  Lab 09/16/21 1041  LIPASE 48   No results for input(s): AMMONIA in the last 168 hours. Coagulation Profile: No results for input(s): INR, PROTIME in the last 168 hours. Cardiac Enzymes: No results for  input(s): CKTOTAL, CKMB, CKMBINDEX, TROPONINI in the last 168 hours. BNP (last 3 results) No  results for input(s): PROBNP in the last 8760 hours. HbA1C: No results for input(s): HGBA1C in the last 72 hours. CBG: No results for input(s): GLUCAP in the last 168 hours. Lipid Profile: No results for input(s): CHOL, HDL, LDLCALC, TRIG, CHOLHDL, LDLDIRECT in the last 72 hours. Thyroid Function Tests: No results for input(s): TSH, T4TOTAL, FREET4, T3FREE, THYROIDAB in the last 72 hours. Anemia Panel: No results for input(s): VITAMINB12, FOLATE, FERRITIN, TIBC, IRON, RETICCTPCT in the last 72 hours. Urine analysis:    Component Value Date/Time   COLORURINE YELLOW 09/26/2020 1616   APPEARANCEUR HAZY (A) 09/26/2020 1616   LABSPEC >1.030 (H) 09/26/2020 1616   PHURINE 5.5 09/26/2020 1616   GLUCOSEU NEGATIVE 09/26/2020 1616   HGBUR LARGE (A) 09/26/2020 1616   BILIRUBINUR NEGATIVE 09/26/2020 1616   BILIRUBINUR neg 06/14/2014 1056   KETONESUR NEGATIVE 09/26/2020 1616   PROTEINUR NEGATIVE 09/26/2020 1616   UROBILINOGEN negative 06/14/2014 1056   UROBILINOGEN 0.2 08/10/2012 1537   NITRITE NEGATIVE 09/26/2020 1616   LEUKOCYTESUR NEGATIVE 09/26/2020 1616    Radiological Exams on Admission: DG Abd Acute W/Chest  Result Date: 09/16/2021 CLINICAL DATA:  Nausea, vomiting, tick bite 10 days ago EXAM: DG ABDOMEN ACUTE WITH 1 VIEW CHEST COMPARISON:  KUB 01/04/2013 chest radiograph 10/26/2020 FINDINGS: Chest: The cardiomediastinal silhouette is normal. There is no focal consolidation or pulmonary edema. There is no pleural effusion or pneumothorax. Abdomen: There is a nonobstructive bowel gas pattern. There is no free intraperitoneal air. There is no gross organomegaly or abnormal soft tissue calcification. Cervical spine fusion hardware is partially imaged. The bones are otherwise unremarkable. IMPRESSION: Negative abdominal radiographs.  No acute cardiopulmonary disease. Electronically Signed   By: Lesia Hausen M.D.   On: 09/16/2021 11:16      Assessment/Plan  Intractable nausea and vomiting Dehydration Possible gastritis/esophagitis History of GERD -Presenting with progressively worsening nausea and vomiting.  Cannot rule out doxycycline induced esophagitis. -IV fluids.  Round-the-clock Zofran for 24 hours.  Reglan as needed. -PPI twice daily IV for now -X-ray of abdomen was unremarkable  Acute kidney injury -Possibly from above.  Monitor  Bicytopenia -Presented with WBC of 1.8 and platelets of 124.  No signs of bleeding.  Monitor.  History of tick bite -Had tick bite 2 weeks ago and was prescribed oral doxycycline few days ago.  Currently is bicytopenia.  Will monitor CBC.  Follow RMSF titers.  Mildly elevated LFT -AST mildly elevated.  Monitor.  Check hepatitis profile in AM.  Hypertension -Monitor blood pressure.  Continue home regimen.  Hypokalemia -Replace.  Repeat a.m. labs  Chronic pain syndrome -Continue home regimen.  Outpatient follow-up with PCP and  History of fibromyalgia/migraine headaches -Outpatient follow-up with PCP   DVT prophylaxis: Start SCDs Code Status: Full Family Communication: None at bedside Disposition Plan: Home in 1 to 2 days once clinically improved Consults called: None Admission status: Observation/MedSurg  Severity of Illness: The appropriate patient status for this patient is OBSERVATION. Observation status is judged to be reasonable and necessary in order to provide the required intensity of service to ensure the patient's safety. The patient's presenting symptoms, physical exam findings, and initial radiographic and laboratory data in the context of their medical condition is felt to place them at decreased risk for further clinical deterioration. Furthermore, it is anticipated that the patient will be medically stable for discharge from the hospital within 2 midnights of admission.     Glade Lloyd MD [facility]  09/16/2021, 5:06 PM

## 2021-09-17 DIAGNOSIS — D61818 Other pancytopenia: Secondary | ICD-10-CM

## 2021-09-17 DIAGNOSIS — G8929 Other chronic pain: Secondary | ICD-10-CM | POA: Diagnosis not present

## 2021-09-17 DIAGNOSIS — R7989 Other specified abnormal findings of blood chemistry: Secondary | ICD-10-CM | POA: Diagnosis not present

## 2021-09-17 DIAGNOSIS — N179 Acute kidney failure, unspecified: Secondary | ICD-10-CM

## 2021-09-17 DIAGNOSIS — R112 Nausea with vomiting, unspecified: Secondary | ICD-10-CM | POA: Diagnosis not present

## 2021-09-17 LAB — COMPREHENSIVE METABOLIC PANEL
ALT: 28 U/L (ref 0–44)
AST: 35 U/L (ref 15–41)
Albumin: 3.4 g/dL — ABNORMAL LOW (ref 3.5–5.0)
Alkaline Phosphatase: 64 U/L (ref 38–126)
Anion gap: 5 (ref 5–15)
BUN: 11 mg/dL (ref 6–20)
CO2: 24 mmol/L (ref 22–32)
Calcium: 8.5 mg/dL — ABNORMAL LOW (ref 8.9–10.3)
Chloride: 110 mmol/L (ref 98–111)
Creatinine, Ser: 1.14 mg/dL (ref 0.61–1.24)
GFR, Estimated: >60 mL/min (ref >60)
Glucose, Bld: 103 mg/dL — ABNORMAL HIGH (ref 70–99)
Potassium: 4.1 mmol/L (ref 3.5–5.1)
Sodium: 139 mmol/L (ref 135–145)
Total Bilirubin: 0.4 mg/dL (ref 0.3–1.2)
Total Protein: 5.9 g/dL — ABNORMAL LOW (ref 6.5–8.1)

## 2021-09-17 LAB — MAGNESIUM: Magnesium: 1.6 mg/dL — ABNORMAL LOW (ref 1.7–2.4)

## 2021-09-17 LAB — CBC
HCT: 37.7 % — ABNORMAL LOW (ref 39.0–52.0)
Hemoglobin: 12.8 g/dL — ABNORMAL LOW (ref 13.0–17.0)
MCH: 29.8 pg (ref 26.0–34.0)
MCHC: 34 g/dL (ref 30.0–36.0)
MCV: 87.9 fL (ref 80.0–100.0)
Platelets: 102 10*3/uL — ABNORMAL LOW (ref 150–400)
RBC: 4.29 MIL/uL (ref 4.22–5.81)
RDW: 13.2 % (ref 11.5–15.5)
WBC: 1.6 10*3/uL — ABNORMAL LOW (ref 4.0–10.5)
nRBC: 0 % (ref 0.0–0.2)

## 2021-09-17 LAB — ROCKY MTN SPOTTED FVR ABS PNL(IGG+IGM)
RMSF IgG: NEGATIVE
RMSF IgM: 0.68 index (ref 0.00–0.89)

## 2021-09-17 LAB — HEPATITIS PANEL, ACUTE
HCV Ab: NONREACTIVE
Hep A IgM: NONREACTIVE
Hep B C IgM: NONREACTIVE
Hepatitis B Surface Ag: NONREACTIVE

## 2021-09-17 LAB — PATHOLOGIST SMEAR REVIEW: Path Review: REACTIVE

## 2021-09-17 LAB — HIV ANTIBODY (ROUTINE TESTING W REFLEX): HIV Screen 4th Generation wRfx: NONREACTIVE

## 2021-09-17 MED ORDER — MAGNESIUM SULFATE 2 GM/50ML IV SOLN
2.0000 g | Freq: Once | INTRAVENOUS | Status: AC
  Administered 2021-09-17: 2 g via INTRAVENOUS
  Filled 2021-09-17: qty 50

## 2021-09-17 MED ORDER — PANTOPRAZOLE SODIUM 40 MG PO TBEC: 40.0000 mg | DELAYED_RELEASE_TABLET | Freq: Two times a day (BID) | ORAL | 0 refills | Status: DC

## 2021-09-17 NOTE — Progress Notes (Signed)
Reviewed written d/c instructions w pt and all questions answered. He verbalized understanding. D/C ambulatory w all belongings in stable condition 

## 2021-09-17 NOTE — Progress Notes (Signed)
Pt ate his breakfast and tol well w/o n/v.  ?

## 2021-09-17 NOTE — Discharge Summary (Signed)
Physician Discharge Summary  ?Richard Franco UMP:[id number] DOB: [birth year] DOA: 09/16/2021 ? ?PCP: Pcp, No ? ?Admit date: 09/16/2021 ?Discharge date: 09/17/2021 ? ?Admitted From: Home ?Disposition: Home ? ?Recommendations for Outpatient Follow-up:  ?Follow up with PCP in 1 week with repeat CBC/BMP ?Recommend outpatient evaluation and follow-up by GI and ID. ?Follow up in ED if symptoms worsen or new appear ? ? ?Home Health: No ?Equipment/Devices: None ? ?Discharge Condition: Stable ?CODE STATUS: Full ?Diet recommendation: Heart healthy ? ?Brief/Interim Summary: ? 56 y.o. male with medical history significant of fibromyalgia, depression, GERD, anxiety, migraines, chronic pain, hypercholesterolemia, chronic chest pain, hypertension presented with worsening vomiting.  Patient states that he has been sick for more than a week with intermittent nausea and progressively worsening vomiting.  He apparently had a tick bite more than 2 weeks ago; had removed a tick from his left shoulder/chest wall area. He went to the ED on 09/12/2021: Had a negative flu test similar no rash was noted at the time.  He was empirically started on doxycycline and Zofran.  He had not noted any rash.  On presentation, creatinine was 1.43, AST of 50, WBC of 1.8, platelets of 124.  He was started on IV fluids and IV doxycycline and antiemetics.  RMSF, Ehrlichia and Lyme testing were sent.  During the hospitalization, his condition has improved.  He tolerated clear liquid diet.  He feels much better.  If he tolerates soft diet today, he will be discharged home today.  Doxycycline will be discontinued.  He might need outpatient follow-up at ID clinic once tickborne labs resolved.  He might also need GI evaluation and follow-up if continues to have outpatient nausea and vomiting. ? ?Discharge Diagnoses:  ? ?Intractable nausea and vomiting ?Dehydration ?Possible gastritis/esophagitis ?History of GERD ?-Presented with progressively worsening nausea and  vomiting.  Cannot rule out doxycycline induced esophagitis. ?-Treated with IV fluids and antiemetics along with IV PPI ?-X-ray of abdomen was unremarkable ?-his condition has improved.  He tolerated clear liquid diet.  He feels much better.  If he tolerates soft diet today, he will be discharged home today.  Doxycycline will be discontinued. ?-He might also need GI evaluation and follow-up if continues to have outpatient nausea and vomiting. ?-Continue PPI twice daily ?  ?Acute kidney injury ?-Possibly from above.  Treated with IV fluids.  Resolved. ? ?Hypomagnesemia ?-Replace prior to discharge ?  ?Pancytopenia ?-Presented with WBC of 1.8 and platelets of 124.  No signs of bleeding.  WBC is 1.6 and platelets 102 today.  Outpatient follow-up of CBC.   ? ?History of tick bite ?-Had tick bite 2 weeks ago and was prescribed oral doxycycline few days ago.  Currently has mild pancytopenia. RMSF, Ehrlichia and Lyme testing were sent.  This can be followed up by PCP.  Recommend outpatient evaluation and follow-up by ID as well for the same ? ?Mildly elevated LFT ?-AST mildly elevated.  Resolved.  Outpatient follow-up. ?  ?Hypertension ?-Monitor blood pressure.  Continue home regimen. ?  ?Hypokalemia ?-Improved. ? ?Chronic pain syndrome ?-Continue home regimen.  Outpatient follow-up with PCP and ? ?History of fibromyalgia/migraine headaches ?-Outpatient follow-up with PCP  ? ?Discharge Instructions ? ? ?Allergies as of 09/17/2021   ? ?   Reactions  ? Midodrine Hcl Swelling, Other (See Comments)  ? Tongue swelling  ? Other Anaphylaxis, Other (See Comments)  ? Patient has assorted food allergies that are severe enough to require use of an Epi-Pen. They occur sometimes, but not always.  ?  Sulfa Antibiotics Rash, Other (See Comments)  ? The skin breaks out  ? Doxycycline Nausea And Vomiting, Other (See Comments)  ? Oral intake of Doxy aggravated already-present nausea and vomiting, but patient can tolerate via IV   ? ?  ? ?   ?Medication List  ?  ? ?STOP taking these medications   ? ?Advil 200 MG tablet ?Generic drug: ibuprofen ?  ?amLODipine 5 MG tablet ?Commonly known as: NORVASC ?  ?cyclobenzaprine 10 MG tablet ?Commonly known as: FLEXERIL ?  ?doxycycline 100 MG capsule ?Commonly known as: VIBRAMYCIN ?  ?esomeprazole 40 MG capsule ?Commonly known as: [place] ?  ?NexIUM 24HR 20 MG capsule ?Generic drug: esomeprazole ?  ?oxyCODONE 20 mg 12 hr tablet ?Commonly known as: OXYCONTIN ?  ? ?  ? ?TAKE these medications   ? ?albuterol 108 (90 Base) MCG/ACT inhaler ?Commonly known as: VENTOLIN HFA ?Inhale 1-2 puffs into the lungs every 6 (six) hours as needed for wheezing or shortness of breath. ?  ?aspirin 81 MG EC tablet ?Take 1 tablet (81 mg total) by mouth daily. Restart on 12/13/20 ?What changed: additional instructions ?  ?Breo Ellipta 100-25 MCG/ACT Aepb ?Generic drug: fluticasone furoate-vilanterol ?Inhale 1 puff into the lungs daily. ?What changed: Another medication with the same name was removed. Continue taking this medication, and follow the directions you see here. ?  ?EPINEPHrine 0.3 mg/0.3 mL Soaj injection ?Commonly known as: EPI-PEN ?Inject 0.3 mg into the muscle once as needed for anaphylaxis. ?  ?FLUoxetine 40 MG capsule ?Commonly known as: PROZAC ?Take 40 mg by mouth at bedtime. ?  ?lamoTRIgine 200 MG tablet ?Commonly known as: LAMICTAL ?Take 400 mg by mouth at bedtime. ?What changed: Another medication with the same name was removed. Continue taking this medication, and follow the directions you see here. ?  ?meclizine 25 MG tablet ?Commonly known as: ANTIVERT ?Take 25 mg by mouth 3 (three) times daily as needed for dizziness. ?  ?nitroGLYCERIN 0.4 MG SL tablet ?Commonly known as: NITROSTAT ?Place 1 tablet (0.4 mg total) under the tongue every 5 (five) minutes as needed for chest pain. ?  ?ondansetron 4 MG disintegrating tablet ?Commonly known as: ZOFRAN-ODT ?Take 1 tablet (4 mg total) by mouth every 8 (eight) hours as  needed for nausea or vomiting. ?What changed: reasons to take this ?  ?oxyCODONE-acetaminophen 10-325 MG tablet ?Commonly known as: PERCOCET ?Take 1 tablet by mouth every 6 (six) hours. ?  ?pantoprazole 40 MG tablet ?Commonly known as: Protonix ?Take 1 tablet (40 mg total) by mouth 2 (two) times daily before a meal for 15 days. ?  ?QUEtiapine 50 MG tablet ?Commonly known as: SEROQUEL ?Take 50 mg by mouth at bedtime as needed (for sleep). ?  ?Systane Balance 0.6 % Soln ?Generic drug: Propylene Glycol ?Place 1 drop into both eyes 4 (four) times daily as needed (for dryness). ?  ?tiZANidine 4 MG tablet ?Commonly known as: ZANAFLEX ?Take 4 mg by mouth every 6 (six) hours as needed for muscle spasms. ?  ? ?  ? ? ? ?Allergies  ?Allergen Reactions  ? Midodrine Hcl Swelling and Other (See Comments)  ?  Tongue swelling  ? Other Anaphylaxis and Other (See Comments)  ?  Patient has assorted food allergies that are severe enough to require use of an Epi-Pen. They occur sometimes, but not always.  ? Sulfa Antibiotics Rash and Other (See Comments)  ?  The skin breaks out  ? Doxycycline Nausea And Vomiting and Other (See Comments)  ?  Oral intake of Doxy aggravated already-present nausea and vomiting, but patient can tolerate via IV   ? ? ?Consultations: ?None ? ? ?Procedures/Studies: ?DG Abd Acute W/Chest ? ?Result Date: 09/16/2021 ?CLINICAL DATA:  Nausea, vomiting, tick bite 10 days ago EXAM: DG ABDOMEN ACUTE WITH 1 VIEW CHEST COMPARISON:  KUB 01/04/2013 chest radiograph 10/26/2020 FINDINGS: Chest: The cardiomediastinal silhouette is normal. There is no focal consolidation or pulmonary edema. There is no pleural effusion or pneumothorax. Abdomen: There is a nonobstructive bowel gas pattern. There is no free intraperitoneal air. There is no gross organomegaly or abnormal soft tissue calcification. Cervical spine fusion hardware is partially imaged. The bones are otherwise unremarkable. IMPRESSION: Negative abdominal radiographs.   No acute cardiopulmonary disease. Electronically Signed   By: Valetta Mole M.D.   On: 09/16/2021 11:16   ? ? ? ?Subjective: ?Patient seen and examined at bedside.  Tolerated clear liquid diet.  Wants to eat solid

## 2021-09-18 LAB — EHRLICHIA ANTIBODY PANEL
E chaffeensis (HGE) Ab, IgG: NEGATIVE
E chaffeensis (HGE) Ab, IgM: NEGATIVE
E. Chaffeensis (HME) IgM Titer: NEGATIVE
E.Chaffeensis (HME) IgG: NEGATIVE

## 2021-09-18 LAB — LYME DISEASE SEROLOGY W/REFLEX: Lyme Total Antibody EIA: NEGATIVE

## 2021-09-19 ENCOUNTER — Other Ambulatory Visit: Payer: Self-pay

## 2021-09-19 ENCOUNTER — Telehealth: Payer: Self-pay

## 2021-09-19 ENCOUNTER — Encounter: Payer: Self-pay | Admitting: Internal Medicine

## 2021-09-19 ENCOUNTER — Ambulatory Visit (INDEPENDENT_AMBULATORY_CARE_PROVIDER_SITE_OTHER): Payer: Medicare Other | Admitting: Internal Medicine

## 2021-09-19 VITALS — BP 112/76 | HR 97 | Temp 97.3°F | Resp 16 | Ht 68.0 in | Wt 165.2 lb

## 2021-09-19 DIAGNOSIS — D7589 Other specified diseases of blood and blood-forming organs: Secondary | ICD-10-CM | POA: Diagnosis not present

## 2021-09-19 DIAGNOSIS — R7989 Other specified abnormal findings of blood chemistry: Secondary | ICD-10-CM

## 2021-09-19 DIAGNOSIS — R112 Nausea with vomiting, unspecified: Secondary | ICD-10-CM | POA: Diagnosis present

## 2021-09-19 DIAGNOSIS — N179 Acute kidney failure, unspecified: Secondary | ICD-10-CM

## 2021-09-19 LAB — CBC WITH DIFFERENTIAL/PLATELET
Absolute Monocytes: 212 cells/uL (ref 200–950)
Basophils Absolute: 10 cells/uL (ref 0–200)
Basophils Relative: 0.5 %
Eosinophils Absolute: 10 cells/uL — ABNORMAL LOW (ref 15–500)
Eosinophils Relative: 0.5 %
HCT: 41.1 % (ref 38.5–50.0)
Hemoglobin: 14.1 g/dL (ref 13.2–17.1)
Lymphs Abs: 794 cells/uL — ABNORMAL LOW (ref 850–3900)
MCH: 30 pg (ref 27.0–33.0)
MCHC: 34.3 g/dL (ref 32.0–36.0)
MCV: 87.4 fL (ref 80.0–100.0)
MPV: 12.6 fL — ABNORMAL HIGH (ref 7.5–12.5)
Monocytes Relative: 10.6 %
Neutro Abs: 974 cells/uL — ABNORMAL LOW (ref 1500–7800)
Neutrophils Relative %: 48.7 %
Platelets: 143 10*3/uL (ref 140–400)
RBC: 4.7 10*6/uL (ref 4.20–5.80)
RDW: 13.5 % (ref 11.0–15.0)
Total Lymphocyte: 39.7 %
WBC: 2 10*3/uL — ABNORMAL LOW (ref 3.8–10.8)

## 2021-09-19 LAB — COMPLETE METABOLIC PANEL WITH GFR
AG Ratio: 1.7 (calc) (ref 1.0–2.5)
ALT: 29 U/L (ref 9–46)
AST: 32 U/L (ref 10–35)
Albumin: 4.2 g/dL (ref 3.6–5.1)
Alkaline phosphatase (APISO): 71 U/L (ref 35–144)
BUN: 8 mg/dL (ref 7–25)
CO2: 33 mmol/L — ABNORMAL HIGH (ref 20–32)
Calcium: 9.4 mg/dL (ref 8.6–10.3)
Chloride: 104 mmol/L (ref 98–110)
Creat: 1.24 mg/dL (ref 0.70–1.30)
Globulin: 2.5 g/dL (calc) (ref 1.9–3.7)
Glucose, Bld: 103 mg/dL — ABNORMAL HIGH (ref 65–99)
Potassium: 4.6 mmol/L (ref 3.5–5.3)
Sodium: 142 mmol/L (ref 135–146)
Total Bilirubin: 0.6 mg/dL (ref 0.2–1.2)
Total Protein: 6.7 g/dL (ref 6.1–8.1)
eGFR: 69 mL/min/{1.73_m2} (ref >=60)

## 2021-09-19 NOTE — Progress Notes (Signed)
?  [facility]  ? ? ? ? ?Reason for Consult: bicytopenia    ?Referring Physician: Dr. Starla Link ? ? ? Patient ID: Richard Franco, male    DOB: 26-Oct-1965, 56 y.o.   MRN: [id number] ? ?HPI:   ?Here for evaluation of lab abnormalities.   ?He has had recent issues with intractable nausea with vomiting that started last week associated with a fever up to 101 and chills.  This started as myalgias in his legs followed by the n/v.  He went to the ED the following day and noted low platelets and leukopenia.  He had reported a tick bite on his left shoulder and so he was given doxycycline to take, which made his symptoms worse.  He also had an elevated AST and creat.  He went back to the ED feeling worse and admitted overnight and tick-related tests sent, all returned negative.  No rash.  His leukopenia and thrombocytopenia persisted and now here for evaluation.  ?He continue to have some nausea and poor intake, though not vomiting now and no fever or chills.  Headache also resolved.  ?He reports for several week prior to this episode he had issues with early satiety as well that was a change for him and had some weight loss from that as well.  He describes himself as a poor eater at baseline but this was a notable change.   ?He is a former smoker. ?He takes acid blocker medication and is miserable without it.   ? ?Past Medical History:  ?Diagnosis Date  ? Abnormal chest CT 03/2012  ? a. 2013: Scattered patchy ground glass opacities and pulmonary nodules. Transbronchial biopsy with no definite etiology of lung finding- possible organizing pneumonia. b. F/u CT 05/2014: stable multiple tiny pulm nodules, no further w/u recommended per notes.   ? Anxiety   ? Arthritis   ? CAD (coronary artery disease)   ? Chronic midline posterior neck pain   ? Chronic pain disorder   ? Complication of anesthesia   ? " difficult to urinate after "  ? Depression   ? Esophageal stricture   ? Fibromyalgia   ? Fracture   ?  back  ? GERD (gastroesophageal reflux disease)   ? Hypercholesteremia   ? Hypertension   ? Migraine headache   ? "none lately; I've had alot in my life" (06/21/2015)  ? NSTEMI (non-ST elevated myocardial infarction) ([place]) 07/2012  ? a. Normal cath 03/2012. b. 07/2012: troponin 4, normal cors, ?vasospasm. Did not tolerate Imdur due to headache. On amlodipine.  ? Testosterone deficiency   ? Tobacco abuse   ? ? ?Prior to Admission medications   ?Medication Sig Start Date End Date Taking? Authorizing Provider  ?aspirin 81 MG EC tablet Take 1 tablet (81 mg total) by mouth daily. Restart on 12/13/20 ?Patient taking differently: Take 81 mg by mouth daily. 12/08/20  Yes Judith Part, MD  ?EPINEPHrine 0.3 mg/0.3 mL IJ SOAJ injection Inject 0.3 mg into the muscle once as needed for anaphylaxis. 07/14/19  Yes [provider]  ?FLUoxetine (PROZAC) 40 MG capsule Take 40 mg by mouth at bedtime.   Yes [provider]  ?lamoTRIgine (LAMICTAL) 200 MG tablet Take 400 mg by mouth at bedtime.   Yes [provider]  ?meclizine (ANTIVERT) 25 MG tablet Take 25 mg by mouth 3 (three) times daily as needed for dizziness.   Yes [provider]  ?nitroGLYCERIN (NITROSTAT) 0.4 MG SL tablet Place 1  tablet (0.4 mg total) under the tongue every 5 (five) minutes as needed for chest pain. 11/13/20  Yes Buford Dresser, MD  ?ondansetron (ZOFRAN-ODT) 4 MG disintegrating tablet Take 1 tablet (4 mg total) by mouth every 8 (eight) hours as needed for nausea or vomiting. ?Patient taking differently: Take 4 mg by mouth every 8 (eight) hours as needed for nausea or vomiting (dissolve orally). 09/12/21  Yes Fredia Sorrow, MD  ?oxyCODONE-acetaminophen (PERCOCET) 10-325 MG tablet Take 1 tablet by mouth every 6 (six) hours. 11/22/20  Yes [provider]  ?pantoprazole (PROTONIX) 40 MG tablet Take 1 tablet (40 mg total) by mouth 2 (two) times daily before a meal for 15 days. 09/17/21 10/02/21 Yes Aline August,  MD  ?Propylene Glycol (SYSTANE BALANCE) 0.6 % SOLN Place 1 drop into both eyes 4 (four) times daily as needed (for dryness).   Yes [provider]  ?QUEtiapine (SEROQUEL) 50 MG tablet Take 50 mg by mouth at bedtime as needed (for sleep). 04/16/20  Yes [provider]  ?tiZANidine (ZANAFLEX) 4 MG tablet Take 4 mg by mouth every 6 (six) hours as needed for muscle spasms.   Yes [provider]  ?albuterol (VENTOLIN HFA) 108 (90 Base) MCG/ACT inhaler Inhale 1-2 puffs into the lungs every 6 (six) hours as needed for wheezing or shortness of breath. ?Patient not taking: Reported on 09/16/2021 11/15/20   Parrett, Fonnie Mu, NP  ?fluticasone furoate-vilanterol (BREO ELLIPTA) 100-25 MCG/INH AEPB Inhale 1 puff into the lungs daily. ?Patient not taking: Reported on 09/16/2021 11/15/20   Parrett, Fonnie Mu, NP  ? ? ?Allergies  ?Allergen Reactions  ? Midodrine Hcl Swelling and Other (See Comments)  ?  Tongue swelling  ? Other Anaphylaxis and Other (See Comments)  ?  Patient has assorted food allergies that are severe enough to require use of an Epi-Pen. They occur sometimes, but not always.  ? Sulfa Antibiotics Rash and Other (See Comments)  ?  The skin breaks out  ? Doxycycline Nausea And Vomiting and Other (See Comments)  ?  Oral intake of Doxy aggravated already-present nausea and vomiting, but patient can tolerate via IV   ? ? ?Social History  ? ?Tobacco Use  ? Smoking status: Former  ?  Packs/day: 1.00  ?  Years: 30.00  ?  Pack years: 30.00  ?  Types: Cigarettes  ?  Quit date: 12/16/2017  ?  Years since quitting: 3.7  ? Smokeless tobacco: Former  ?  Types: Chew  ?Vaping Use  ? Vaping Use: Never used  ?Substance Use Topics  ? Alcohol use: Not Currently  ?  Alcohol/week: 21.0 standard drinks  ?  Types: 21 Cans of beer per week  ? Drug use: No  ? ? ?Family History  ?Problem Relation Age of Onset  ? Diabetes Mother   ? Cancer Mother   ?     ?  ? Hypertension Mother   ? Hyperlipidemia Father   ? Diabetes Father    ? Coronary artery disease Paternal Uncle   ? Asthma Sister   ? Diabetes Brother   ? Hypertension Brother   ? Colon cancer Neg Hx   ? Colon polyps Neg Hx   ? Rectal cancer Neg Hx   ? Stomach cancer Neg Hx   ? ? ?Review of Systems ? Constitutional: negative for fevers and chills ?Gastrointestinal: positive for nausea, negative for vomiting and diarrhea ?Integument/breast: negative for rash ?All other systems reviewed and are negative   ? ?Constitutional: in  no apparent distress  ?Vitals:  ? 09/19/21 0850  ?BP: 112/76  ?Pulse: 97  ?Resp: 16  ?Temp: (!) 97.3 ?F (36.3 ?C)  ?SpO2: 99%  ? ?EYES: anicteric ?ENMT: no thrush ?Cardiovascular: Cor RRR ?Respiratory: clear ?GI: soft ?Musculoskeletal: no edema ?Skin: left shoulder with a small scab at site of tick bite, no erythema, no drainage. ? ?Labs: ?Lab Results  ?Component Value Date  ? WBC 1.6 (L) 09/17/2021  ? HGB 12.8 (L) 09/17/2021  ? HCT 37.7 (L) 09/17/2021  ? MCV 87.9 09/17/2021  ? PLT 102 (L) 09/17/2021  ?  ?Lab Results  ?Component Value Date  ? CREATININE 1.14 09/17/2021  ? BUN 11 09/17/2021  ? NA 139 09/17/2021  ? K 4.1 09/17/2021  ? CL 110 09/17/2021  ? CO2 24 09/17/2021  ?  ?Lab Results  ?Component Value Date  ? ALT 28 09/17/2021  ? AST 35 09/17/2021  ? ALKPHOS 64 09/17/2021  ? BILITOT 0.4 09/17/2021  ? INR 0.99 08/10/2012  ?  ? ?Assessment: viral gastroenteritis, resolving with underlying prolonged early satiety.  ?I discussed the lab abnormalities and with the cytopenias, elevated LFTs and dehydration, I most suspect this is viral illness-related.  I will recheck his labs today to confirm this today.  If worsening or no better, will have him return for a recheck and consider a bone marrow biopsy.   ?For his early satiety, this seems to be a progressive symptom and may need consideration of endoscopy, particularly upper endoscopy.  He was already referred to GI and will call today to get scheduled.    ? ?Plan: ?1)  recheck CBC with diff and CMP ?2) follow with  GI ?Will return if there are concerns on labs today ?

## 2021-09-19 NOTE — Telephone Encounter (Signed)
Patient aware. Patient scheduled for lab appointment on 4/10. CBC w/ diff ordered. ? ? ? ?Richard Franco, CMA ? ?

## 2021-09-19 NOTE — Telephone Encounter (Signed)
-----   Message from Thayer Headings, MD sent at 09/19/2021  3:35 PM EDT ----- ?His labs are reassuring with his platelet count now back to normal and his WBC slowly increasing.  His liver tests and creat also normal now.  He can recheck the CBC with diff in 2-3 weeks to be sure the WBC continues to improve.  He can come here for it or with his PCP.   ?thanks ? ?

## 2021-09-21 LAB — CULTURE, BLOOD (ROUTINE X 2)
Culture: NO GROWTH
Culture: NO GROWTH
Special Requests: ADEQUATE

## 2021-10-01 ENCOUNTER — Other Ambulatory Visit: Payer: Self-pay

## 2021-10-01 ENCOUNTER — Other Ambulatory Visit: Payer: Medicare Other

## 2021-10-01 DIAGNOSIS — R112 Nausea with vomiting, unspecified: Secondary | ICD-10-CM

## 2021-10-01 DIAGNOSIS — N179 Acute kidney failure, unspecified: Secondary | ICD-10-CM

## 2021-10-01 DIAGNOSIS — R7989 Other specified abnormal findings of blood chemistry: Secondary | ICD-10-CM

## 2021-10-01 DIAGNOSIS — D7589 Other specified diseases of blood and blood-forming organs: Secondary | ICD-10-CM

## 2021-10-01 LAB — CBC WITH DIFFERENTIAL/PLATELET
Absolute Monocytes: 407 cells/uL (ref 200–950)
Basophils Absolute: 11 cells/uL (ref 0–200)
Basophils Relative: 0.3 %
Eosinophils Absolute: 30 cells/uL (ref 15–500)
Eosinophils Relative: 0.8 %
HCT: 37.1 % — ABNORMAL LOW (ref 38.5–50.0)
Hemoglobin: 12.4 g/dL — ABNORMAL LOW (ref 13.2–17.1)
Lymphs Abs: 931 cells/uL (ref 850–3900)
MCH: 29.7 pg (ref 27.0–33.0)
MCHC: 33.4 g/dL (ref 32.0–36.0)
MCV: 89 fL (ref 80.0–100.0)
MPV: 12.1 fL (ref 7.5–12.5)
Monocytes Relative: 10.7 %
Neutro Abs: 2421 cells/uL (ref 1500–7800)
Neutrophils Relative %: 63.7 %
Platelets: 231 10*3/uL (ref 140–400)
RBC: 4.17 10*6/uL — ABNORMAL LOW (ref 4.20–5.80)
RDW: 13.6 % (ref 11.0–15.0)
Total Lymphocyte: 24.5 %
WBC: 3.8 10*3/uL (ref 3.8–10.8)

## 2021-10-01 LAB — COMPREHENSIVE METABOLIC PANEL
AG Ratio: 1.6 (calc) (ref 1.0–2.5)
ALT: 14 U/L (ref 9–46)
AST: 15 U/L (ref 10–35)
Albumin: 3.9 g/dL (ref 3.6–5.1)
Alkaline phosphatase (APISO): 57 U/L (ref 35–144)
BUN: 12 mg/dL (ref 7–25)
CO2: 32 mmol/L (ref 20–32)
Calcium: 9.1 mg/dL (ref 8.6–10.3)
Chloride: 107 mmol/L (ref 98–110)
Creat: 1.16 mg/dL (ref 0.70–1.30)
Globulin: 2.4 g/dL (calc) (ref 1.9–3.7)
Glucose, Bld: 85 mg/dL (ref 65–99)
Potassium: 4 mmol/L (ref 3.5–5.3)
Sodium: 142 mmol/L (ref 135–146)
Total Bilirubin: 0.5 mg/dL (ref 0.2–1.2)
Total Protein: 6.3 g/dL (ref 6.1–8.1)

## 2021-10-02 NOTE — Progress Notes (Deleted)
? ? ?10/02/2021 ?Richard Franco ?[id number] ?Apr 13, 1966 ? ? ?ASSESSMENT AND PLAN:  ? ?There are no diagnoses linked to this encounter. ? ? ?History of Present Illness:  ?56 y.o. male  with a past medical history of hypertension, hyperlipidemia, chronic pain on oxycodone non-STEMI with normal catheterization 2013, GERD with history of esophageal stricture and others listed below, known to Dr. Silverio Decamp returns to clinic today for evaluation of ***. ? ?03/11/2016 upper endoscopy normal esophagus, normal stomach: Small hiatal hernia, normal duodenum no specimens collected. ?02/18/2012 colonoscopy by Dr. Deatra Ina excellent prep, abnormal mucosa rectosigmoid colon biopsy showed benign mucosa recall 10 years, due 01/2022. ?01/28/2012 upper endoscopy with Dr. Deatra Ina showed prominent gastric folds otherwise normal examination status post Maloney dilatation for dysphagia. ? ?Patient's had been following with infectious disease for fever, nausea vomiting, elevated liver function after tick bite.  Patient was given doxycycline.   ?He also had leukopenia and thrombocytopenia which persisted.  ?09/17/2021 white blood cell count 1.6, hemoglobin 12.8, MCV 87.9, platelets 102, AST 50, ALT 36, total bilirubin 0.6 ?09/19/2021 white blood cell count 2.0, hemoglobin 14.1, platelets 143.  AST 32, ALT 29, alk phos 71, total bili 0.6 ?10/01/2021 white blood cell count 3.8, hemoglobin 12.4, platelets 231 ? ?Pancytopenia has resolved, liver function back to normal. ?At the visit with infectious disease patient was started about decreased appetite, weight loss, some upper GI symptoms. ? ?Previous GI history: ? ?DG Abd Acute W/Chest ? ?Result Date: 09/16/2021 ?CLINICAL DATA:  Nausea, vomiting, tick bite 10 days ago EXAM: DG ABDOMEN ACUTE WITH 1 VIEW CHEST COMPARISON:  KUB 01/04/2013 chest radiograph 10/26/2020 FINDINGS: Chest: The cardiomediastinal silhouette is normal. There is no focal consolidation or pulmonary edema. There is no pleural  effusion or pneumothorax. Abdomen: There is a nonobstructive bowel gas pattern. There is no free intraperitoneal air. There is no gross organomegaly or abnormal soft tissue calcification. Cervical spine fusion hardware is partially imaged. The bones are otherwise unremarkable. IMPRESSION: Negative abdominal radiographs.  No acute cardiopulmonary disease. Electronically Signed   By: Valetta Mole M.D.   On: 09/16/2021 11:16   ? ?Current Medications:  ? ? ?Current Outpatient Medications (Cardiovascular):  ?  EPINEPHrine 0.3 mg/0.3 mL IJ SOAJ injection, Inject 0.3 mg into the muscle once as needed for anaphylaxis. ?  nitroGLYCERIN (NITROSTAT) 0.4 MG SL tablet, Place 1 tablet (0.4 mg total) under the tongue every 5 (five) minutes as needed for chest pain. ? ?Current Outpatient Medications (Respiratory):  ?  albuterol (VENTOLIN HFA) 108 (90 Base) MCG/ACT inhaler, Inhale 1-2 puffs into the lungs every 6 (six) hours as needed for wheezing or shortness of breath. (Patient not taking: Reported on 09/16/2021) ?  fluticasone furoate-vilanterol (BREO ELLIPTA) 100-25 MCG/INH AEPB, Inhale 1 puff into the lungs daily. (Patient not taking: Reported on 09/16/2021) ? ?Current Outpatient Medications (Analgesics):  ?  aspirin 81 MG EC tablet, Take 1 tablet (81 mg total) by mouth daily. Restart on 12/13/20 (Patient taking differently: Take 81 mg by mouth daily.) ?  oxyCODONE-acetaminophen (PERCOCET) 10-325 MG tablet, Take 1 tablet by mouth every 6 (six) hours. ? ? ?Current Outpatient Medications (Other):  ?  FLUoxetine (PROZAC) 40 MG capsule, Take 40 mg by mouth at bedtime. ?  lamoTRIgine (LAMICTAL) 200 MG tablet, Take 400 mg by mouth at bedtime. ?  meclizine (ANTIVERT) 25 MG tablet, Take 25 mg by mouth 3 (three) times daily as needed for dizziness. ?  ondansetron (ZOFRAN-ODT) 4 MG disintegrating tablet, Take 1 tablet (4 mg total) by  mouth every 8 (eight) hours as needed for nausea or vomiting. (Patient taking differently: Take 4 mg by  mouth every 8 (eight) hours as needed for nausea or vomiting (dissolve orally).) ?  pantoprazole (PROTONIX) 40 MG tablet, Take 1 tablet (40 mg total) by mouth 2 (two) times daily before a meal for 15 days. ?  Propylene Glycol (SYSTANE BALANCE) 0.6 % SOLN, Place 1 drop into both eyes 4 (four) times daily as needed (for dryness). ?  QUEtiapine (SEROQUEL) 50 MG tablet, Take 50 mg by mouth at bedtime as needed (for sleep). ?  tiZANidine (ZANAFLEX) 4 MG tablet, Take 4 mg by mouth every 6 (six) hours as needed for muscle spasms. ? ?Surgical History:  ?He  has a past surgical history that includes Posterior cervical fusion/foraminotomy (05/09/2011); Cystectomy; Ankle Fusion (Left, 2006); Video bronchoscopy (05/06/2012); left heart catheterization with coronary angiogram (N/A, 03/25/2012); left heart catheterization with coronary angiogram (N/A, 08/11/2012); Cervical fusion (X7 "last one 04/2011"); Back surgery; Fracture surgery; Upper gastrointestinal endoscopy; Colonoscopy; and Posterior cervical fusion/foraminotomy (N/A, 12/06/2020). ?Family History:  ?His family history includes Asthma in his sister; Cancer in his mother; Coronary artery disease in his paternal uncle; Diabetes in his brother, father, and mother; Hyperlipidemia in his father; Hypertension in his brother and mother. ?Social History:  ? reports that he quit smoking about 3 years ago. His smoking use included cigarettes. He has a 30.00 pack-year smoking history. He has quit using smokeless tobacco.  His smokeless tobacco use included chew. He reports that he does not currently use alcohol after a past usage of about 21.0 standard drinks per week. He reports that he does not use drugs. ? ?Current Medications, Allergies, Past Medical History, Past Surgical History, Family History and Social History were reviewed in [employer] record. ? ?Physical Exam: ?There were no vitals taken for this visit. ?General:   Pleasant, well developed male  in no acute distress ?Heart:  Regular rate and rhythm; no murmurs ?Pulm: Clear anteriorly; no wheezing ?Abdomen:  {BlankSingle:19197::"Distended","Ridged","Soft"}, {BlankSingle:19197::"Flat","Obese","Non-distended"} AB, {BlankSingle:19197::"Absent","Hyperactive, tinkling","Hypoactive","Sluggish","Active"} bowel sounds. {actendernessAB:27319} tenderness {anatomy; site abdomen:5010}. {BlankMultiple:19196::"Without guarding","With guarding","Without rebound","With rebound"}, No organomegaly appreciated. ?Extremities:  {With/without:5700}  edema. ?Neurologic:  Alert and  oriented x4;  No focal deficits.  ?Psych:  Cooperative. Normal mood and affect. ? ? ?Vladimir Crofts, PA-C ?10/02/21 ?

## 2021-10-03 ENCOUNTER — Ambulatory Visit: Payer: Medicare Other | Admitting: Primary Care

## 2021-10-03 ENCOUNTER — Ambulatory Visit: Payer: Medicare Other | Admitting: Physician Assistant

## 2021-10-03 NOTE — Progress Notes (Deleted)
? ?'@Patient'$  ID: Richard Franco, male    DOB: 1965-10-31, 56 y.o.   MRN: [id number] ? ?No chief complaint on file. ? ? ?Referring provider: ?No ref. provider found ? ?HPI: ?56 year old male, former smoker quit in 2019 (30-pack-year history).  Past medical history significant for hypertension, pulmonary infiltrates, pulmonary nodule. Patient of Dr. Chase Caller, last seen by pulmonary NP on 11/15/20.  ? ?Previous [facility] pulmonary encounter: ?11/15/2020 Follow up :Dyspnea  ?Patient presents for a follow-up visit.  Last seen 1 year ago.  Patient complains that he has ongoing chest tightness, wheezing, shortness of breath.   patient has a history of seem to be worse when he tries to lay down at night.  Schronic chest pain.  Says this is been worse for the last couple months.  Describes it as a chronic squeezing sensation in his chest wall sometimes sharp in nature.  Symptoms patient denies any reflux.  He is taking Nexium twice daily.  No dysphagia.  No orthopnea PND syncope or palpitations. ?Does have a  minimal dry cough.  ?Previous pulmonary function testing July 2019 showed normal lung function with no airflow obstruction or restriction.  FEV1 was 108%, ratio 84, FVC 100% no significant bronchodilator response, DLCO was 89%. ?  ?Seen by cardiology this morning felt to have atypical chest pain.  Was started on nonsteroidals.  Set up for a stress test.  And lab work including CRP, sed rate, BNP.  ?  ? ?10/03/2021 ?Patient presents today for acute OV/pleuritic chest pain.  ? ?He had normal PFTs in 2019. He also had a normal CXR in March 2023.  ? ?FENO ?D-dimer ? ? ? ?  ?  ?TEST/EVENTS :  ?07/02/11 present visit ?-CT chest with resolution of GGO  ?- persistent nodules  ?-steroids decreased to 5 mg  ?05/06/12 ?Bronchoscopy with BAL, cell count and TBBx of the lingula  ?BAL cell count with elevated macrophages (possible COP, Hypersensitivity pneumonitis vs chronic microaspiration, possible Aluminum exposure)  ?RVP neg Viral culture  neg  ?AFB smear neg, culture negative   ?Cytology/pathology benign with no evidence of aspiration or ILD  ?/22/13  Remains on 10 mg steroids;  ?-methacholine challenge neg ?-barium swallow WNL ?Alpha 1 MS 125  ?09/16/12  ?-CT with no GGO, lung nodules stable  ?Previous cardiopulmonary stress test in 2014 showed deconditioning. 2D echo in 2013 showed grade 1 diastolic dysfunction with EF of 55-60%. Coronary angiography in 2014 showed normal coronary anatomy ?04/2016 normal with no restriction or airflow obstruction  ?2017 Exhaled nitric oxide today in the office i 11 ppb and normal ?  ? ?Allergies  ?Allergen Reactions  ? Midodrine Hcl Swelling and Other (See Comments)  ?  Tongue swelling  ? Other Anaphylaxis and Other (See Comments)  ?  Patient has assorted food allergies that are severe enough to require use of an Epi-Pen. They occur sometimes, but not always.  ? Sulfa Antibiotics Rash and Other (See Comments)  ?  The skin breaks out  ? Doxycycline Nausea And Vomiting and Other (See Comments)  ?  Oral intake of Doxy aggravated already-present nausea and vomiting, but patient can tolerate via IV   ? ? ?Immunization History  ?Administered Date(s) Administered  ? Influenza Inj Mdck Quad Pf 05/30/2017  ? Influenza Split 04/16/2012  ? Influenza,inj,Quad PF,6+ Mos 03/01/2013, 05/08/2015, 05/30/2017  ? Influenza,inj,quad, With Preservative 05/02/2016, 05/30/2017, 06/02/2018  ? Influenza-Unspecified 05/02/2016, 06/02/2018  ? Moderna Sars-Covid-2 Vaccination 02/07/2020, 03/15/2020  ? Pneumococcal Polysaccharide-23 04/16/2012  ? Pneumococcal-Unspecified  04/16/2012  ? Tdap 06/02/2018  ? ? ?Past Medical History:  ?Diagnosis Date  ? Abnormal chest CT 03/2012  ? a. 2013: Scattered patchy ground glass opacities and pulmonary nodules. Transbronchial biopsy with no definite etiology of lung finding- possible organizing pneumonia. b. F/u CT 05/2014: stable multiple tiny pulm nodules, no further w/u recommended per notes.   ? Anxiety    ? Arthritis   ? CAD (coronary artery disease)   ? Chronic midline posterior neck pain   ? Chronic pain disorder   ? Complication of anesthesia   ? " difficult to urinate after "  ? Depression   ? Esophageal stricture   ? Fibromyalgia   ? Fracture   ? back  ? GERD (gastroesophageal reflux disease)   ? Hypercholesteremia   ? Hypertension   ? Migraine headache   ? "none lately; I've had alot in my life" (06/21/2015)  ? NSTEMI (non-ST elevated myocardial infarction) ([place]) 07/2012  ? a. Normal cath 03/2012. b. 07/2012: troponin 4, normal cors, ?vasospasm. Did not tolerate Imdur due to headache. On amlodipine.  ? Testosterone deficiency   ? Tobacco abuse   ? ? ?Tobacco History: ?Social History  ? ?Tobacco Use  ?Smoking Status Former  ? Packs/day: 1.00  ? Years: 30.00  ? Pack years: 30.00  ? Types: Cigarettes  ? Quit date: 12/16/2017  ? Years since quitting: 3.8  ?Smokeless Tobacco Former  ? Types: Chew  ? ?Counseling given: Not Answered ? ? ?Outpatient Medications Prior to Visit  ?Medication Sig Dispense Refill  ? albuterol (VENTOLIN HFA) 108 (90 Base) MCG/ACT inhaler Inhale 1-2 puffs into the lungs every 6 (six) hours as needed for wheezing or shortness of breath. (Patient not taking: Reported on 09/16/2021) 8 g 2  ? aspirin 81 MG EC tablet Take 1 tablet (81 mg total) by mouth daily. Restart on 12/13/20 (Patient taking differently: Take 81 mg by mouth daily.) 30 tablet 12  ? EPINEPHrine 0.3 mg/0.3 mL IJ SOAJ injection Inject 0.3 mg into the muscle once as needed for anaphylaxis.    ? FLUoxetine (PROZAC) 40 MG capsule Take 40 mg by mouth at bedtime.    ? fluticasone furoate-vilanterol (BREO ELLIPTA) 100-25 MCG/INH AEPB Inhale 1 puff into the lungs daily. (Patient not taking: Reported on 09/16/2021) 30 each 5  ? lamoTRIgine (LAMICTAL) 200 MG tablet Take 400 mg by mouth at bedtime.    ? meclizine (ANTIVERT) 25 MG tablet Take 25 mg by mouth 3 (three) times daily as needed for dizziness.    ? nitroGLYCERIN (NITROSTAT) 0.4 MG SL  tablet Place 1 tablet (0.4 mg total) under the tongue every 5 (five) minutes as needed for chest pain. 25 tablet 7  ? ondansetron (ZOFRAN-ODT) 4 MG disintegrating tablet Take 1 tablet (4 mg total) by mouth every 8 (eight) hours as needed for nausea or vomiting. (Patient taking differently: Take 4 mg by mouth every 8 (eight) hours as needed for nausea or vomiting (dissolve orally).) 12 tablet 1  ? oxyCODONE-acetaminophen (PERCOCET) 10-325 MG tablet Take 1 tablet by mouth every 6 (six) hours.    ? pantoprazole (PROTONIX) 40 MG tablet Take 1 tablet (40 mg total) by mouth 2 (two) times daily before a meal for 15 days. 30 tablet 0  ? Propylene Glycol (SYSTANE BALANCE) 0.6 % SOLN Place 1 drop into both eyes 4 (four) times daily as needed (for dryness).    ? QUEtiapine (SEROQUEL) 50 MG tablet Take 50 mg by mouth at bedtime as needed (  for sleep).    ? tiZANidine (ZANAFLEX) 4 MG tablet Take 4 mg by mouth every 6 (six) hours as needed for muscle spasms.    ? ?No facility-administered medications prior to visit.  ? ? ? ? ?Review of Systems ? ?Review of Systems ? ? ?Physical Exam ? ?There were no vitals taken for this visit. ?Physical Exam  ? ?Lab Results: ? ?CBC ?   ?Component Value Date/Time  ? WBC 3.8 10/01/2021 0939  ? RBC 4.17 (L) 10/01/2021 0939  ? HGB 12.4 (L) 10/01/2021 0939  ? HGB 15.0 09/18/2015 0845  ? HCT 37.1 (L) 10/01/2021 0939  ? HCT 44.3 09/18/2015 0845  ? PLT 231 10/01/2021 0939  ? PLT 235 09/18/2015 0845  ? MCV 89.0 10/01/2021 0939  ? MCV 93 09/18/2015 0845  ? MCH 29.7 10/01/2021 0939  ? MCHC 33.4 10/01/2021 0939  ? RDW 13.6 10/01/2021 0939  ? RDW 14.1 09/18/2015 0845  ? LYMPHSABS 931 10/01/2021 0939  ? LYMPHSABS 1.3 09/18/2015 0845  ? MONOABS 0.2 09/16/2021 1041  ? EOSABS 30 10/01/2021 0939  ? EOSABS 0.2 09/18/2015 0845  ? BASOSABS 11 10/01/2021 0939  ? BASOSABS 0.0 09/18/2015 0845  ? ? ?BMET ?   ?Component Value Date/Time  ? NA 142 10/01/2021 0939  ? NA 142 09/18/2015 0845  ? K 4.0 10/01/2021 0939  ? CL 107  10/01/2021 0939  ? CO2 32 10/01/2021 0939  ? GLUCOSE 85 10/01/2021 0939  ? BUN 12 10/01/2021 0939  ? BUN 11 09/18/2015 0845  ? CREATININE 1.16 10/01/2021 0939  ? CALCIUM 9.1 10/01/2021 0939  ? GFRNONAA >

## 2021-10-19 ENCOUNTER — Encounter: Payer: Self-pay | Admitting: Gastroenterology

## 2021-11-05 ENCOUNTER — Ambulatory Visit: Payer: Medicare Other | Admitting: Gastroenterology

## 2021-11-29 ENCOUNTER — Encounter: Payer: Self-pay | Admitting: *Deleted

## 2021-11-29 ENCOUNTER — Ambulatory Visit (INDEPENDENT_AMBULATORY_CARE_PROVIDER_SITE_OTHER): Payer: Medicare Other | Admitting: Physician Assistant

## 2021-11-29 VITALS — BP 124/76 | HR 91 | Ht 68.0 in | Wt 174.0 lb

## 2021-11-29 DIAGNOSIS — R112 Nausea with vomiting, unspecified: Secondary | ICD-10-CM

## 2021-11-29 DIAGNOSIS — K625 Hemorrhage of anus and rectum: Secondary | ICD-10-CM

## 2021-11-29 DIAGNOSIS — K219 Gastro-esophageal reflux disease without esophagitis: Secondary | ICD-10-CM

## 2021-11-29 DIAGNOSIS — R1084 Generalized abdominal pain: Secondary | ICD-10-CM

## 2021-11-29 DIAGNOSIS — R1013 Epigastric pain: Secondary | ICD-10-CM

## 2021-11-29 DIAGNOSIS — R634 Abnormal weight loss: Secondary | ICD-10-CM

## 2021-11-29 MED ORDER — ESOMEPRAZOLE MAGNESIUM 40 MG PO CPDR: 40.0000 mg | DELAYED_RELEASE_CAPSULE | Freq: Two times a day (BID) | ORAL | 0 refills | Status: DC

## 2021-11-29 NOTE — Patient Instructions (Addendum)
You have been scheduled for a CT scan of the abdomen and pelvis at [facility] 1st floor Radiology. You are scheduled on 12/03/21  at 7:30 am. You should arrive 15 minutes prior to your appointment time for registration.    We have given you oral contrast solution. The solution may taste better if refrigerated, but do NOT add ice or any other liquid to this solution. Shake well before drinking.   Please follow the written instructions below on the day of your exam:   1) Do not eat anything after 3:30 am (4 hours prior to your test)   2) Drink 1 bottle of contrast @ 5:30 am (2 hours prior to your exam)  Remember to shake well before drinking and do NOT pour over ice.     Drink 1 bottle of contrast @ 6:30 am (1 hour prior to your exam)   You may take any medications as prescribed with a small amount of water, if necessary. If you take any of the following medications: METFORMIN, GLUCOPHAGE, GLUCOVANCE, AVANDAMET, RIOMET, FORTAMET, [place] MET, JANUMET, GLUMETZA or METAGLIP, you MAY be asked to HOLD this medication 48 hours AFTER the exam.   The purpose of you drinking the oral contrast is to aid in the visualization of your intestinal tract. The contrast solution may cause some diarrhea. Depending on your individual set of symptoms, you may also receive an intravenous injection of x-ray contrast/dye. Plan on being at [facility] for 45 minutes or longer, depending on the type of exam you are having performed.   If you have any questions regarding your exam or if you need to reschedule, you may call Radiology scheduling at [phone] between the hours of 8:00 am and 5:00 pm, Monday-Friday.   ______________________________________________   Richard Franco have been scheduled for an endoscopy and colonoscopy. Please follow the written instructions given to you at your visit today. Please pick up your prep supplies at the pharmacy within the next 1-3 days. If you use inhalers (even only as  needed), please bring them with you on the day of your procedure.  We have sent the following medications to your pharmacy for you to pick up at your convenience:  Nexium 40 mg twice daily  Please avoid all NSAID's. Some examples of NSAID's are as follows: Aspirin (Bufferin, Bayer, and Excedrin) Ibuprofen (Advil, Motrin, Nuprin) Ketoprofen (Actron, Orudis) Naproxen (Aleve) Daypro  Indocin  Lodine  Naprosyn  Relafen  Vimovo Voltaren  If you are age 86 or older, your body mass index should be between 23-30. Your Body mass index is 26.46 kg/m. If this is out of the aforementioned range listed, please consider follow up with your Primary Care Provider.  If you are age 5 or younger, your body mass index should be between 19-25. Your Body mass index is 26.46 kg/m. If this is out of the aformentioned range listed, please consider follow up with your Primary Care Provider.   _____________________________________________________  The [facility] providers would like to encourage you to use [facility] to communicate with providers for non-urgent requests or questions.  Due to long hold times on the telephone, sending your provider a message by [facility] may be a faster and more efficient way to get a response.  Please allow 48 business hours for a response.  Please remember that this is for non-urgent requests.  _____________________________________________________  Due to recent changes in healthcare laws, you may see the results of your imaging and laboratory studies on MyChart before  your provider has had a chance to review them.  We understand that in some cases there may be results that are confusing or concerning to you. Not all laboratory results come back in the same time frame and the provider may be waiting for multiple results in order to interpret others.  Please give [place] 48 hours in order for your provider to thoroughly review all the results before contacting the office for clarification  of your results.

## 2021-11-29 NOTE — Progress Notes (Signed)
Chief Complaint: Nausea and vomiting, abdominal pain and rectal bleeding  HPI:    Richard Franco is a 56 year old Caucasian male with a past medical history as listed below including CAD, reflux and multiple others, known to Dr. Silverio Decamp, who was referred to me by Aline August, MD for a complaint of nausea and vomiting, abdominal pain and rectal bleeding.    02/18/2012 colonoscopy normal.  Repeat recommended in 10 years.    03/11/2016 EGD for nausea and vomiting and persistent upper abdominal pain was normal other than small hiatal hernia.  Patient given Levsin and Zofran.  Also started on probiotic.    09/16/2021 abdominal x-ray for nausea and vomiting and history of a tick bite 10 days prior was normal.    10/01/21 CBC with a hemoglobin minimally decreased at 12.4 and otherwise normal.  CMP normal.    11/12/2021 patient treated for a sinus infection by his PCP.  He was given Augmentin.    Today, the patient presents to clinic and tells me for the past year he has had severe abdominal pain, tells me his abdomen is always "tender to touch", and occasionally he will get a stinging/cramping sensation that just hits and is out of nowhere to the point where he cannot "straighten up" due to the 10/10 pain for about 20 minutes or so.  It then tends to pass on its own.  Also describes he gets an epigastric pain anytime that he eats and 3 weeks ago this was so bad that he could not eat for a week.  Associated symptoms include occasional nausea and a weight loss of around 20 pounds over the past year or so.  Tells me he feels like he eats and food seems to stay at the top of his abdomen.  He only eats 1 meal a day which he has done for years.  Currently taking over-the-counter Nexium 20 mg once a day.  Does admit to using sometimes 5 Goody powders a day as well as Ibuprofen sometimes 8 at a time chronically for chronic neck pain with multiple neck surgeries in the past.  Also tells me he drinks at least 8 [facility] a day.    Also describes some bright red blood in his bowel movements over the past couple of months maybe 6-7 times.    Does tell me that previously he was an alcoholic but has not had any alcohol over the past 2 to 3 years.    Denies fever or chills.  Past Medical History:  Diagnosis Date   Abnormal chest CT 03/2012   a. 2013: Scattered patchy ground glass opacities and pulmonary nodules. Transbronchial biopsy with no definite etiology of lung finding- possible organizing pneumonia. b. F/u CT 05/2014: stable multiple tiny pulm nodules, no further w/u recommended per notes.    Anxiety    Arthritis    CAD (coronary artery disease)    Chronic midline posterior neck pain    Chronic pain disorder    Complication of anesthesia    " difficult to urinate after "   Depression    Elevated LFTs    Esophageal stricture    Fibromyalgia    Fracture    back   GERD (gastroesophageal reflux disease)    Hypercholesteremia    Hypertension    Iron deficiency    Migraine headache    "none lately; I've had alot in my life" (06/21/2015)   NSTEMI (non-ST elevated myocardial infarction) ([place]) 07/2012   a. Normal cath  03/2012. b. 07/2012: troponin 4, normal cors, ?vasospasm. Did not tolerate Imdur due to headache. On amlodipine.   Testosterone deficiency    Tobacco abuse     Past Surgical History:  Procedure Laterality Date   ANKLE FUSION Left 2006   BACK SURGERY     CERVICAL FUSION  X7 "last one 04/2011"   COLONOSCOPY     CYSTECTOMY     FRACTURE SURGERY     LEFT HEART CATHETERIZATION WITH CORONARY ANGIOGRAM N/A 03/25/2012   Procedure: LEFT HEART CATHETERIZATION WITH CORONARY ANGIOGRAM;  Surgeon: Burnell Blanks, MD;  Location: [facility] CATH LAB;  Service: Cardiovascular;  Laterality: N/A;   LEFT HEART CATHETERIZATION WITH CORONARY ANGIOGRAM N/A 08/11/2012   Procedure: LEFT HEART CATHETERIZATION WITH CORONARY ANGIOGRAM;  Surgeon: Peter M Martinique, MD;  Location: [facility] CATH LAB;  Service:  Cardiovascular;  Laterality: N/A;   POSTERIOR CERVICAL FUSION/FORAMINOTOMY  05/09/2011   Procedure: POSTERIOR CERVICAL FUSION/FORAMINOTOMY LEVEL 1;  Surgeon: Hosie Spangle;  Location: [place] [facility]UR[facility] OR[facility];  Service: Neurosurgery;  Laterality: N/A;  C4/5 posterior arthrodesis with instrumentation    POSTERIOR CERVICAL FUSION/FORAMINOTOMY N/A 12/06/2020   Procedure: Cervical two Ganglionectomy with extension of fusion from Cervical one to Cervical five;  Surgeon: Judith Part, MD;  Location: [facility];  Service: Neurosurgery;  Laterality: N/A;   UPPER GASTROINTESTINAL ENDOSCOPY     VIDEO BRONCHOSCOPY  05/06/2012   Procedure: VIDEO BRONCHOSCOPY WITH FLUORO;  Surgeon: Jyl Heinz, MD;  Location: [facility] ENDOSCOPY;  Service: Cardiopulmonary;  Laterality: Bilateral;    Current Outpatient Medications  Medication Sig Dispense Refill   albuterol (VENTOLIN HFA) 108 (90 Base) MCG/ACT inhaler Inhale 1-2 puffs into the lungs every 6 (six) hours as needed for wheezing or shortness of breath. (Patient not taking: Reported on 09/16/2021) 8 g 2   aspirin 81 MG EC tablet Take 1 tablet (81 mg total) by mouth daily. Restart on 12/13/20 (Patient taking differently: Take 81 mg by mouth daily.) 30 tablet 12   EPINEPHrine 0.3 mg/0.3 mL IJ SOAJ injection Inject 0.3 mg into the muscle once as needed for anaphylaxis.     FLUoxetine (PROZAC) 40 MG capsule Take 40 mg by mouth at bedtime.     fluticasone furoate-vilanterol (BREO ELLIPTA) 100-25 MCG/INH AEPB Inhale 1 puff into the lungs daily. (Patient not taking: Reported on 09/16/2021) 30 each 5   lamoTRIgine (LAMICTAL) 200 MG tablet Take 400 mg by mouth at bedtime.     meclizine (ANTIVERT) 25 MG tablet Take 25 mg by mouth 3 (three) times daily as needed for dizziness.     nitroGLYCERIN (NITROSTAT) 0.4 MG SL tablet Place 1 tablet (0.4 mg total) under the tongue every 5 (five) minutes as needed for chest pain. 25 tablet 7   ondansetron (ZOFRAN-ODT) 4 MG disintegrating tablet Take 1  tablet (4 mg total) by mouth every 8 (eight) hours as needed for nausea or vomiting. (Patient taking differently: Take 4 mg by mouth every 8 (eight) hours as needed for nausea or vomiting (dissolve orally).) 12 tablet 1   oxyCODONE-acetaminophen (PERCOCET) 10-325 MG tablet Take 1 tablet by mouth every 6 (six) hours.     pantoprazole (PROTONIX) 40 MG tablet Take 1 tablet (40 mg total) by mouth 2 (two) times daily before a meal for 15 days. 30 tablet 0   Propylene Glycol (SYSTANE BALANCE) 0.6 % SOLN Place 1 drop into both eyes 4 (four) times daily as needed (for dryness).     QUEtiapine (SEROQUEL) 50 MG tablet Take 50 mg  by mouth at bedtime as needed (for sleep).     tiZANidine (ZANAFLEX) 4 MG tablet Take 4 mg by mouth every 6 (six) hours as needed for muscle spasms.     No current facility-administered medications for this visit.    Allergies as of 11/29/2021 - Review Complete 09/19/2021  Allergen Reaction Noted   Midodrine hcl Swelling and Other (See Comments)    Other Anaphylaxis and Other (See Comments) 09/16/2021   Sulfa antibiotics Rash and Other (See Comments) 02/26/2012   Doxycycline Nausea And Vomiting and Other (See Comments) 09/16/2021    Family History  Problem Relation Age of Onset   Diabetes Mother    Cancer Mother        ?   Hypertension Mother    Hyperlipidemia Father    Diabetes Father    Coronary artery disease Paternal Uncle    Asthma Sister    Diabetes Brother    Hypertension Brother    Colon cancer Neg Hx    Colon polyps Neg Hx    Rectal cancer Neg Hx    Stomach cancer Neg Hx     Social History   Socioeconomic History   Marital status: Divorced    Spouse name: Not on file   Number of children: 1   Years of education: Not on file   Highest education level: Not on file  Occupational History   Occupation: Chief Strategy Officer  Tobacco Use   Smoking status: Former    Packs/day: 1.00    Years: 30.00    Total pack years: 30.00    Types: Cigarettes    Quit date:  12/16/2017    Years since quitting: 3.9   Smokeless tobacco: Former    Types: Nurse, children's Use: Never used  Substance and Sexual Activity   Alcohol use: Not Currently    Alcohol/week: 21.0 standard drinks of alcohol    Types: 21 Cans of beer per week   Drug use: No   Sexual activity: Yes  Other Topics Concern   Not on file  Social History Narrative   Not on file   Social Determinants of Health   Financial Resource Strain: Not on file  Food Insecurity: Not on file  Transportation Needs: Not on file  Physical Activity: Not on file  Stress: Not on file  Social Connections: Not on file  Intimate Partner Violence: Not on file    Review of Systems:    Constitutional: No fever or chills Skin: No rash Cardiovascular: No chest pain Respiratory: No SOB Gastrointestinal: See HPI and otherwise negative Genitourinary: No dysuria  Neurological: No headache, dizziness or syncope Musculoskeletal: No new muscle or joint pain Hematologic: No bleeding  Psychiatric: No history of depression or anxiety   Physical Exam:  Vital signs: BP 124/76   Pulse 91   Ht '5\' 8"'$  (1.727 m)   Wt 174 lb (78.9 kg)   SpO2 98%   BMI 26.46 kg/m    Constitutional:   Pleasant Caucasian male appears to be in NAD, Well developed, Well nourished, alert and cooperative Head:  Normocephalic and atraumatic. Eyes:   PEERL, EOMI. No icterus. Conjunctiva pink. Ears:  Normal auditory acuity. Neck:  Supple Throat: Oral cavity and pharynx without inflammation, swelling or lesion.  Respiratory: Respirations even and unlabored. Lungs clear to auscultation bilaterally.   No wheezes, crackles, or rhonchi.  Cardiovascular: Normal S1, S2. No MRG. Regular rate and rhythm. No peripheral edema, cyanosis or pallor.  Gastrointestinal:  Soft, nondistended,  marked TTP in all quadrants with involuntary guarding, normal bowel sounds. No appreciable masses or hepatomegaly. Rectal:  Not performed.  Msk:  Symmetrical  without gross deformities. Without edema, no deformity or joint abnormality.  Neurologic:  Alert and  oriented x4;  grossly normal neurologically.  Skin:   Dry and intact without significant lesions or rashes. +tattoos Psychiatric: Oriented to person, place and time. Demonstrates good judgement and reason without abnormal affect or behaviors.  RELEVANT LABS AND IMAGING: CBC    Component Value Date/Time   WBC 3.8 10/01/2021 0939   RBC 4.17 (L) 10/01/2021 0939   HGB 12.4 (L) 10/01/2021 0939   HGB 15.0 09/18/2015 0845   HCT 37.1 (L) 10/01/2021 0939   HCT 44.3 09/18/2015 0845   PLT 231 10/01/2021 0939   PLT 235 09/18/2015 0845   MCV 89.0 10/01/2021 0939   MCV 93 09/18/2015 0845   MCH 29.7 10/01/2021 0939   MCHC 33.4 10/01/2021 0939   RDW 13.6 10/01/2021 0939   RDW 14.1 09/18/2015 0845   LYMPHSABS 931 10/01/2021 0939   LYMPHSABS 1.3 09/18/2015 0845   MONOABS 0.2 09/16/2021 1041   EOSABS 30 10/01/2021 0939   EOSABS 0.2 09/18/2015 0845   BASOSABS 11 10/01/2021 0939   BASOSABS 0.0 09/18/2015 0845    CMP     Component Value Date/Time   NA 142 10/01/2021 0939   NA 142 09/18/2015 0845   K 4.0 10/01/2021 0939   CL 107 10/01/2021 0939   CO2 32 10/01/2021 0939   GLUCOSE 85 10/01/2021 0939   BUN 12 10/01/2021 0939   BUN 11 09/18/2015 0845   CREATININE 1.16 10/01/2021 0939   CALCIUM 9.1 10/01/2021 0939   PROT 6.3 10/01/2021 0939   PROT 6.3 09/18/2015 0845   ALBUMIN 3.4 (L) 09/17/2021 0403   ALBUMIN 4.2 09/18/2015 0845   AST 15 10/01/2021 0939   ALT 14 10/01/2021 0939   ALKPHOS 64 09/17/2021 0403   BILITOT 0.5 10/01/2021 0939   BILITOT 0.4 09/18/2015 0845   GFRNONAA >60 09/17/2021 0403   GFRAA >60 06/23/2019 1742    Assessment: 1.  Generalized abdominal pain: In the epigastrium anytime that he eats as well as generalized sharp pains that he feels at different times, associated with weight loss over the past year, extreme NSAID use history, also rectal bleeding; consider most  likely damage from NSAID use including ulcers 2.  Rectal bleeding: Increasing in frequency over the past few months, typically bright red blood in the toilet paper; consider relation to above versus hemorrhoids versus other 3.  Nausea and vomiting 4.  GERD 5. Weight Loss  Plan: 1.  Ordered a CT of the abdomen pelvis to be done within the next few days for further evaluation of marked abdominal pain.  This will be completed prior to time of endoscopic work-up. 2.  Scheduled patient for EGD and colonoscopy with Dr. Hilarie Fredrickson on 12/04/2021 as he had sooner availability than Dr. Silverio Decamp.  Did provide the patient a detailed list of risks for the procedures and he agrees to proceed. Patient is appropriate for endoscopic procedure(s) in the ambulatory ([place]) setting.  3.  Advised the patient to abstain from NSAIDs.  Also discussed an antireflux diet and lifestyle. 4.  Started the patient on Nexium 40 mg twice a day, 30-60  minutes before breakfast and dinner.  #60 with 5 refills. 5.  Patient to follow in clinic per recommendations after procedures above.  Dr. Silverio Decamp will continue to be his primary physician here  after time of procedures.  Ellouise Newer, PA-C [facility] 11/29/2021, 1:23 PM  Cc: Aline August, MD

## 2021-11-29 NOTE — Addendum Note (Signed)
Addended by: Larina Bras on: 11/29/2021 02:19 PM   Modules accepted: Orders

## 2021-11-30 NOTE — Progress Notes (Signed)
Addendum: Reviewed and agree with assessment and management plan. Mistey Hoffert M, MD  

## 2021-12-03 ENCOUNTER — Ambulatory Visit (HOSPITAL_BASED_OUTPATIENT_CLINIC_OR_DEPARTMENT_OTHER)
Admission: RE | Admit: 2021-12-03 | Discharge: 2021-12-03 | Disposition: A | Discharge status: Home / Self Care | Payer: Medicare Other | Source: Ambulatory Visit | Attending: Physician Assistant | Admitting: Physician Assistant

## 2021-12-03 ENCOUNTER — Other Ambulatory Visit: Payer: Self-pay

## 2021-12-03 ENCOUNTER — Telehealth: Payer: Self-pay | Admitting: Physician Assistant

## 2021-12-03 DIAGNOSIS — K219 Gastro-esophageal reflux disease without esophagitis: Secondary | ICD-10-CM | POA: Insufficient documentation

## 2021-12-03 DIAGNOSIS — R634 Abnormal weight loss: Secondary | ICD-10-CM | POA: Insufficient documentation

## 2021-12-03 DIAGNOSIS — K625 Hemorrhage of anus and rectum: Secondary | ICD-10-CM | POA: Insufficient documentation

## 2021-12-03 DIAGNOSIS — R1084 Generalized abdominal pain: Secondary | ICD-10-CM | POA: Diagnosis present

## 2021-12-03 DIAGNOSIS — R1013 Epigastric pain: Secondary | ICD-10-CM | POA: Diagnosis present

## 2021-12-03 DIAGNOSIS — R112 Nausea with vomiting, unspecified: Secondary | ICD-10-CM | POA: Insufficient documentation

## 2021-12-03 MED ORDER — IOHEXOL 300 MG/ML  SOLN
100.0000 mL | Freq: Once | INTRAMUSCULAR | Status: AC | PRN
  Administered 2021-12-03: 100 mL via INTRAVENOUS

## 2021-12-03 MED ORDER — PLENVU 140 G PO SOLR: 1.0000 | ORAL | 0 refills | Status: DC

## 2021-12-03 NOTE — Telephone Encounter (Signed)
Pt called in about not receiving prep meds (plenvu) please call pt when he can pick it up.

## 2021-12-03 NOTE — Telephone Encounter (Signed)
Returned call to patient. He states that Plenuv was not available at [facility], no where in a 50 miles radius. I advised pt that we have samples of the prep and he will need to come pick it up. Sample of Plenuv placed at front desk for pt to pick up this afternoon. Pt voiced understanding.

## 2021-12-03 NOTE — Telephone Encounter (Signed)
Prep resent to pharmacy.

## 2021-12-04 ENCOUNTER — Encounter: Payer: Self-pay | Admitting: Internal Medicine

## 2021-12-04 ENCOUNTER — Ambulatory Visit (AMBULATORY_SURGERY_CENTER): Payer: Medicare Other | Admitting: Internal Medicine

## 2021-12-04 VITALS — BP 111/71 | HR 61 | Temp 98.2°F | Resp 17 | Ht 68.0 in | Wt 174.0 lb

## 2021-12-04 DIAGNOSIS — R112 Nausea with vomiting, unspecified: Secondary | ICD-10-CM | POA: Diagnosis not present

## 2021-12-04 DIAGNOSIS — R1013 Epigastric pain: Secondary | ICD-10-CM

## 2021-12-04 DIAGNOSIS — K648 Other hemorrhoids: Secondary | ICD-10-CM

## 2021-12-04 DIAGNOSIS — K449 Diaphragmatic hernia without obstruction or gangrene: Secondary | ICD-10-CM

## 2021-12-04 DIAGNOSIS — K3189 Other diseases of stomach and duodenum: Secondary | ICD-10-CM | POA: Diagnosis not present

## 2021-12-04 DIAGNOSIS — R634 Abnormal weight loss: Secondary | ICD-10-CM

## 2021-12-04 DIAGNOSIS — K219 Gastro-esophageal reflux disease without esophagitis: Secondary | ICD-10-CM | POA: Diagnosis not present

## 2021-12-04 DIAGNOSIS — K625 Hemorrhage of anus and rectum: Secondary | ICD-10-CM

## 2021-12-04 MED ORDER — SODIUM CHLORIDE 0.9 % IV SOLN: 500.0000 mL | Freq: Once | INTRAVENOUS | Status: DC

## 2021-12-04 NOTE — Progress Notes (Signed)
Pt non-responsive, VVS, Report to RN  °

## 2021-12-04 NOTE — Progress Notes (Signed)
See office note dated 11/29/2021 for details and recent H&P Patient of Dr. Silverio Decamp added to my schedule to allow for sooner endoscopic evaluation.  He remains appropriate for [facility] upper and lower endoscopy today

## 2021-12-04 NOTE — Patient Instructions (Signed)
Read all of the handouts given to you by your recovery room nurse.  YOU HAD AN ENDOSCOPIC PROCEDURE TODAY AT [facility]:   Refer to the procedure report that was given to you for any specific questions about what was found during the examination.  If the procedure report does not answer your questions, please call your gastroenterologist to clarify.  If you requested that your care partner not be given the details of your procedure findings, then the procedure report has been included in a sealed envelope for you to review at your convenience later.  YOU SHOULD EXPECT: Some feelings of bloating in the abdomen. Passage of more gas than usual.  Walking can help get rid of the air that was put into your GI tract during the procedure and reduce the bloating. If you had a lower endoscopy (such as a colonoscopy or flexible sigmoidoscopy) you may notice spotting of blood in your stool or on the toilet paper. If you underwent a bowel prep for your procedure, you may not have a normal bowel movement for a few days.  Please Note:  You might notice some irritation and congestion in your nose or some drainage.  This is from the oxygen used during your procedure.  There is no need for concern and it should clear up in a day or so.  SYMPTOMS TO REPORT IMMEDIATELY:  Following lower endoscopy (colonoscopy or flexible sigmoidoscopy):  Excessive amounts of blood in the stool  Significant tenderness or worsening of abdominal pains  Swelling of the abdomen that is new, acute  Fever of 100F or higher  Following upper endoscopy (EGD)  Vomiting of blood or coffee ground material  New chest pain or pain under the shoulder blades  Painful or persistently difficult swallowing  New shortness of breath  Fever of 100F or higher  Black, tarry-looking stools  For urgent or emergent issues, a gastroenterologist can be reached at any hour by calling [phone]. Do not use MyChart messaging for urgent  concerns.    DIET:  We do recommend a small meal at first, but then you may proceed to your regular diet.  Drink plenty of fluids but you should avoid alcoholic beverages for 24 hours. Stay away or limit acidic foods or drinks ie:  citric acid.  Read the handouts regarding GERD.  ACTIVITY:  You should plan to take it easy for the rest of today and you should NOT DRIVE or use heavy machinery until tomorrow (because of the sedation medicines used during the test).    FOLLOW UP: Our staff will call the number listed on your records 24-72 hours following your procedure to check on you and address any questions or concerns that you may have regarding the information given to you following your procedure. If we do not reach you, we will leave a message.  We will attempt to reach you two times.  During this call, we will ask if you have developed any symptoms of COVID 19. If you develop any symptoms (ie: fever, flu-like symptoms, shortness of breath, cough etc.) before then, please call [phone].  If you test positive for Covid 19 in the 2 weeks post procedure, please call and report this information to [place].    If any biopsies were taken you will be contacted by phone or by letter within the next 1-3 weeks.  Please call us at [phone] if you have not heard about the biopsies in 3 weeks.    SIGNATURES/CONFIDENTIALITY:  You and/or your care partner have signed paperwork which will be entered into your electronic medical record.  These signatures attest to the fact that that the information above on your After Visit Summary has been reviewed and is understood.  Full responsibility of the confidentiality of this discharge information lies with you and/or your care-partner.

## 2021-12-04 NOTE — Progress Notes (Signed)
Notified Alla German, CRNA that patient drank about 4oz of water at 30. Ok to do procedure at 2:00pm.

## 2021-12-04 NOTE — Op Note (Signed)
[facility] Patient Name: Richard Franco Procedure Date: 12/04/2021 1:54 PM MRN: [id number] Endoscopist: Jerene Bears , MD Age: 56 Referring MD:  Date of Birth: [birth year] Gender: Male Account #: [id number] Procedure:                Upper GI endoscopy Indications:              Epigastric abdominal pain, Gastro-esophageal reflux                            disease, Nausea with vomiting, Weight loss Medicines:                Monitored Anesthesia Care Procedure:                Pre-Anesthesia Assessment:                           - Prior to the procedure, a History and Physical                            was performed, and patient medications and                            allergies were reviewed. The patient's tolerance of                            previous anesthesia was also reviewed. The risks                            and benefits of the procedure and the sedation                            options and risks were discussed with the patient.                            All questions were answered, and informed consent                            was obtained. Prior Anticoagulants: The patient has                            taken no previous anticoagulant or antiplatelet                            agents. ASA Grade Assessment: II - A patient with                            mild systemic disease. After reviewing the risks                            and benefits, the patient was deemed in                            satisfactory condition to undergo the procedure.  After obtaining informed consent, the endoscope was                            passed under direct vision. Throughout the                            procedure, the patient's blood pressure, pulse, and                            oxygen saturations were monitored continuously. The                            Endoscope was introduced through the mouth, and                            advanced to the  second part of duodenum. The upper                            GI endoscopy was accomplished without difficulty.                            The patient tolerated the procedure well. Scope In: Scope Out: Findings:                 The examined esophagus was normal.                           A 2 cm hiatal hernia was present.                           The entire examined stomach was normal. Biopsies                            were taken with a cold forceps for histology and                            Helicobacter pylori testing.                           The examined duodenum was normal. Biopsies were                            taken with a cold forceps for histology. Complications:            No immediate complications. Estimated Blood Loss:     Estimated blood loss was minimal. Impression:               - Normal esophagus.                           - 2 cm hiatal hernia.                           - Normal stomach. Biopsied.                           -  Normal examined duodenum. Biopsied. Recommendation:           - Patient has a contact number available for                            emergencies. The signs and symptoms of potential                            delayed complications were discussed with the                            patient. Return to normal activities tomorrow.                            Written discharge instructions were provided to the                            patient.                           - Resume previous diet.                           - Continue present medications.                           - Await pathology results.                           - See the other procedure note for documentation of                            additional recommendations. Jerene Bears, MD 12/04/2021 2:26:57 PM This report has been signed electronically.

## 2021-12-04 NOTE — Progress Notes (Signed)
Called to room to assist during endoscopic procedure.  Patient ID and intended procedure confirmed with present staff. Received instructions for my participation in the procedure from the performing physician.  

## 2021-12-04 NOTE — Progress Notes (Signed)
Patient was fine until he sat in the wheelchair.  States stinging in lower abdomen. Patient did not give me a pain number. Pt went to the bathroom to see if he could pass gas.  Patient refuses levsin.    Patient states that he has had the stinging before, and that it will go away.  Patient does take oxycodone for back pain. Patient states that "it will go away." And that he wanted "to go home." Patient was told to call us if it worsened.

## 2021-12-04 NOTE — Op Note (Signed)
[facility] Patient Name: Richard Franco Procedure Date: 12/04/2021 1:53 PM MRN: [id number] Endoscopist: Jerene Bears , MD Age: 56 Referring MD:  Date of Birth: 1965-08-12 Gender: Male Account #: [id number] Procedure:                Colonoscopy Indications:              Rectal bleeding Medicines:                Monitored Anesthesia Care Procedure:                Pre-Anesthesia Assessment:                           - Prior to the procedure, a History and Physical                            was performed, and patient medications and                            allergies were reviewed. The patient's tolerance of                            previous anesthesia was also reviewed. The risks                            and benefits of the procedure and the sedation                            options and risks were discussed with the patient.                            All questions were answered, and informed consent                            was obtained. Prior Anticoagulants: The patient has                            taken no previous anticoagulant or antiplatelet                            agents. ASA Grade Assessment: II - A patient with                            mild systemic disease. After reviewing the risks                            and benefits, the patient was deemed in                            satisfactory condition to undergo the procedure.                           After obtaining informed consent, the colonoscope  was passed under direct vision. Throughout the                            procedure, the patient's blood pressure, pulse, and                            oxygen saturations were monitored continuously. The                            CF [id number] #[id number] was introduced through the anus                            and advanced to the terminal ileum. The colonoscopy                            was performed without difficulty. The  patient                            tolerated the procedure well. The quality of the                            bowel preparation was good. The terminal ileum was                            photographed. Scope In: 2:10:54 PM Scope Out: 2:23:38 PM Scope Withdrawal Time: 0 hours 11 minutes 13 seconds  Total Procedure Duration: 0 hours 12 minutes 44 seconds  Findings:                 The digital rectal exam was normal.                           The terminal ileum appeared normal.                           The colon (entire examined portion) appeared normal.                           Internal hemorrhoids were found during                            retroflexion. The hemorrhoids were small. Complications:            No immediate complications. Estimated Blood Loss:     Estimated blood loss: none. Impression:               - The examined portion of the ileum was normal.                           - The entire examined colon is normal.                           - Small internal hemorrhoids. Likely source of                            rectal bleeding.                           -  No specimens collected. Recommendation:           - Patient has a contact number available for                            emergencies. The signs and symptoms of potential                            delayed complications were discussed with the                            patient. Return to normal activities tomorrow.                            Written discharge instructions were provided to the                            patient.                           - Resume previous diet.                           - Continue present medications.                           - Await pathology taken today from EGD. Follow-up                            with Dr. Silverio Decamp if persistent troublesome GI                            symptoms.                           - Repeat colonoscopy in 10 years for screening                             purposes. Jerene Bears, MD 12/04/2021 2:35:24 PM This report has been signed electronically.

## 2021-12-05 ENCOUNTER — Telehealth: Payer: Self-pay

## 2021-12-05 NOTE — Telephone Encounter (Signed)
  Follow up Call-     12/04/2021   12:57 PM  Call back number  Post procedure Call Back phone  # [phone]  Permission to leave phone message Yes     Patient questions:  Do you have a fever, pain , or abdominal swelling? No. Pain Score  0 *  Have you tolerated food without any problems? Yes.    Have you been able to return to your normal activities? Yes.    Do you have any questions about your discharge instructions: Diet   No. Medications  No. Follow up visit  No.  Do you have questions or concerns about your Care? No.  Actions: * If pain score is 4 or above: No action needed, pain <4.

## 2021-12-14 ENCOUNTER — Encounter: Payer: Self-pay | Admitting: Internal Medicine

## 2021-12-18 ENCOUNTER — Telehealth: Payer: Self-pay | Admitting: Internal Medicine

## 2021-12-19 NOTE — Progress Notes (Deleted)
New Patient Note  RE: Richard Franco MRN: [id number] DOB: Apr 18, 1966 Date of Office Visit: 12/20/2021  Consult requested by: Teressa Senter, FNP Primary care provider: Pcp, No  Chief Complaint: No chief complaint on file.  History of Present Illness: I had the pleasure of seeing Richard Franco for initial evaluation at the [facility] on 12/19/2021. He is a 56 y.o. male, who is referred here by Pcp, No for the evaluation of angioedema.  Swelling started about *** ago. Mainly occurs on his ***. Describes them as ***. Individual swelling episodes last about ***. No ecchymosis upon resolution. Associated symptoms include: ***.  Frequency of episodes: ***. Suspected triggers are ***. Denies any *** fevers, chills, changes in medications, foods, personal care products or recent infections. He has tried the following therapies: *** with *** benefit. Systemic steroids ***. Currently on ***.  Previous work up includes: ***. Previous history of swelling: {Blank single:19197::"yes","no"}. Family history of angioedema: {Blank single:19197::"yes","no"}. Patient is up to date with the following cancer screening tests: ***. Ace-inhibitor use: {Blank single:19197::"yes","no"}  Assessment and Plan: Richard Franco is a 56 y.o. male with: No problem-specific Assessment & Plan notes found for this encounter.  No follow-ups on file.  No orders of the defined types were placed in this encounter.  Lab Orders  No laboratory test(s) ordered today    Other allergy screening: Asthma: {Blank single:19197::"yes","no"} Rhino conjunctivitis: {Blank single:19197::"yes","no"} Food allergy: {Blank single:19197::"yes","no"} Medication allergy: {Blank single:19197::"yes","no"} Hymenoptera allergy: {Blank single:19197::"yes","no"} Urticaria: {Blank single:19197::"yes","no"} Eczema:{Blank single:19197::"yes","no"} History of recurrent infections suggestive of immunodeficency: {Blank  single:19197::"yes","no"}  Diagnostics: Spirometry:  Tracings reviewed. His effort: {Blank single:19197::"Good reproducible efforts.","It was hard to get consistent efforts and there is a question as to whether this reflects a maximal maneuver.","Poor effort, data can not be interpreted."} FVC: ***L FEV1: ***L, ***% predicted FEV1/FVC ratio: ***% Interpretation: {Blank single:19197::"Spirometry consistent with mild obstructive disease","Spirometry consistent with moderate obstructive disease","Spirometry consistent with severe obstructive disease","Spirometry consistent with possible restrictive disease","Spirometry consistent with mixed obstructive and restrictive disease","Spirometry uninterpretable due to technique","Spirometry consistent with normal pattern","No overt abnormalities noted given today's efforts"}.  Please see scanned spirometry results for details.  Skin Testing: {Blank single:19197::"Select foods","Environmental allergy panel","Environmental allergy panel and select foods","Food allergy panel","None","Deferred due to recent antihistamines use"}. *** Results discussed with patient/family.   Past Medical History: Patient Active Problem List   Diagnosis Date Noted   Intractable nausea and vomiting 09/16/2021   Hypokalemia 09/16/2021   Elevated LFTs 09/16/2021   Bicytopenia 09/16/2021   Post-operative complication 76/54/6503   Occipital neuralgia 12/06/2020   Throat swelling 07/06/2016   Cocaine use 07/06/2016   ETOH abuse 07/06/2016   Dyspnea on exertion 07/04/2016   Chest pain 07/04/2016   Essential hypertension 07/04/2016   AKI (acute kidney injury) ([facility]) 07/04/2016   Chest pain, atypical 11/16/2015   Pleuritic chest pain 06/22/2015   Tobacco abuse 06/22/2015   History of coronary vasospasm 06/22/2015   Smoking history 07/19/2014   Chronic fatigue 07/19/2014   Chronic arthralgias of knees and hips 11/26/2012   Hyperlipidemia 09/25/2012   GERD  (gastroesophageal reflux disease) 08/11/2012   BPH (benign prostatic hyperplasia) 08/11/2012   Abnormal immunological finding in serum 05/03/2012   Pulmonary infiltrates 04/16/2012   Pulmonary nodule 03/25/2012   Abnormal CT lung screening 03/25/2012   History of tobacco abuse 02/18/2012   Chronic pain 02/18/2012   Hx of migraine headaches 02/18/2012   DYSPNEA 08/24/2009   CHEST PAIN-UNSPECIFIED 08/24/2009   ABFND, FALSE POSITIVE SEROLOGIC TEST,  [facility] 01/12/2007   OSTEOARTHRITIS 01/05/2007   Past Medical History:  Diagnosis Date   Abnormal chest CT 03/2012   a. 2013: Scattered patchy ground glass opacities and pulmonary nodules. Transbronchial biopsy with no definite etiology of lung finding- possible organizing pneumonia. b. F/u CT 05/2014: stable multiple tiny pulm nodules, no further w/u recommended per notes.    Anxiety    Arthritis    CAD (coronary artery disease)    CHF (congestive heart failure) (HCC)    Chronic midline posterior neck pain    Chronic pain disorder    Complication of anesthesia    " difficult to urinate after "   Depression    Elevated LFTs    Esophageal stricture    Fibromyalgia    Fracture    back   GERD (gastroesophageal reflux disease)    Hypercholesteremia    Hypertension    Iron deficiency    Migraine headache    "none lately; I've had alot in my life" (06/21/2015)   NSTEMI (non-ST elevated myocardial infarction) ([place]) 07/2012   a. Normal cath 03/2012. b. 07/2012: troponin 4, normal cors, ?vasospasm. Did not tolerate Imdur due to headache. On amlodipine.   Testosterone deficiency    Tobacco abuse    Past Surgical History: Past Surgical History:  Procedure Laterality Date   ANKLE FUSION Left 2006   BACK SURGERY     CERVICAL FUSION  X7 "last one 04/2011"   COLONOSCOPY     CYSTECTOMY     FRACTURE SURGERY     LEFT HEART CATHETERIZATION WITH CORONARY ANGIOGRAM N/A 03/25/2012   Procedure: LEFT HEART CATHETERIZATION WITH CORONARY ANGIOGRAM;   Surgeon: Burnell Blanks, MD;  Location: [facility] CATH LAB;  Service: Cardiovascular;  Laterality: N/A;   LEFT HEART CATHETERIZATION WITH CORONARY ANGIOGRAM N/A 08/11/2012   Procedure: LEFT HEART CATHETERIZATION WITH CORONARY ANGIOGRAM;  Surgeon: Peter M Martinique, MD;  Location: [facility] CATH LAB;  Service: Cardiovascular;  Laterality: N/A;   POSTERIOR CERVICAL FUSION/FORAMINOTOMY  05/09/2011   Procedure: POSTERIOR CERVICAL FUSION/FORAMINOTOMY LEVEL 1;  Surgeon: Hosie Spangle;  Location: [facility] NEURO ORS;  Service: Neurosurgery;  Laterality: N/A;  C4/5 posterior arthrodesis with instrumentation    POSTERIOR CERVICAL FUSION/FORAMINOTOMY N/A 12/06/2020   Procedure: Cervical two Ganglionectomy with extension of fusion from Cervical one to Cervical five;  Surgeon: Judith Part, MD;  Location: [facility];  Service: Neurosurgery;  Laterality: N/A;   UPPER GASTROINTESTINAL ENDOSCOPY     VIDEO BRONCHOSCOPY  05/06/2012   Procedure: VIDEO BRONCHOSCOPY WITH FLUORO;  Surgeon: Jyl Heinz, MD;  Location: [facility] ENDOSCOPY;  Service: Cardiopulmonary;  Laterality: Bilateral;   Medication List:  Current Outpatient Medications  Medication Sig Dispense Refill   aspirin 81 MG EC tablet Take 1 tablet (81 mg total) by mouth daily. Restart on 12/13/20 (Patient taking differently: Take 81 mg by mouth daily.) 30 tablet 12   EPINEPHrine 0.3 mg/0.3 mL IJ SOAJ injection Inject 0.3 mg into the muscle once as needed for anaphylaxis.     esomeprazole (NEXIUM) 40 MG capsule Take 1 capsule (40 mg total) by mouth 2 (two) times daily before a meal. 180 capsule 0   FLUoxetine (PROZAC) 40 MG capsule Take 40 mg by mouth at bedtime.     lamoTRIgine (LAMICTAL) 200 MG tablet Take 400 mg by mouth at bedtime.     nitroGLYCERIN (NITROSTAT) 0.4 MG SL tablet Place 1 tablet (0.4 mg total) under the tongue every 5 (five) minutes as needed for chest pain. 25 tablet 7  oxyCODONE-acetaminophen (PERCOCET) 10-325 MG tablet Take 1 tablet by mouth every 6  (six) hours.     QUEtiapine (SEROQUEL) 50 MG tablet Take 50 mg by mouth at bedtime as needed (for sleep).     tiZANidine (ZANAFLEX) 4 MG tablet Take 4 mg by mouth every 6 (six) hours as needed for muscle spasms.     No current facility-administered medications for this visit.   Allergies: Allergies  Allergen Reactions   Midodrine Hcl Swelling and Other (See Comments)    Tongue swelling   Other Anaphylaxis and Other (See Comments)    Patient has assorted food allergies that are severe enough to require use of an Epi-Pen. They occur sometimes, but not always.   Sulfa Antibiotics Rash and Other (See Comments)    The skin breaks out   Doxycycline Nausea And Vomiting and Other (See Comments)    Oral intake of Doxy aggravated already-present nausea and vomiting, but patient can tolerate via IV    Social History: Social History   Socioeconomic History   Marital status: Divorced    Spouse name: Not on file   Number of children: 1   Years of education: Not on file   Highest education level: Not on file  Occupational History   Occupation: Chief Strategy Officer  Tobacco Use   Smoking status: Former    Packs/day: 1.00    Years: 30.00    Total pack years: 30.00    Types: Cigarettes    Quit date: 12/16/2017    Years since quitting: 4.0   Smokeless tobacco: Former    Types: Nurse, children's Use: Never used  Substance and Sexual Activity   Alcohol use: Not Currently    Alcohol/week: 21.0 standard drinks of alcohol    Types: 21 Cans of beer per week   Drug use: No   Sexual activity: Yes  Other Topics Concern   Not on file  Social History Narrative   Not on file   Social Determinants of Health   Financial Resource Strain: Not on file  Food Insecurity: Not on file  Transportation Needs: Not on file  Physical Activity: Not on file  Stress: Not on file  Social Connections: Not on file   Lives in a ***. Smoking: *** Occupation: ***  Environmental HistoryFreight forwarder  in the house: Estate agent in the family room: {Blank single:19197::"yes","no"} Carpet in the bedroom: {Blank single:19197::"yes","no"} Heating: {Blank single:19197::"electric","gas","heat pump"} Cooling: {Blank single:19197::"central","window","heat pump"} Pet: {Blank single:19197::"yes ***","no"}  Family History: Family History  Problem Relation Age of Onset   Diabetes Mother    Cancer Mother        ?   Hypertension Mother    Hyperlipidemia Father    Diabetes Father    Coronary artery disease Paternal Uncle    Asthma Sister    Diabetes Brother    Hypertension Brother    Colon cancer Neg Hx    Colon polyps Neg Hx    Rectal cancer Neg Hx    Stomach cancer Neg Hx    Problem                               Relation Asthma                                   *** Eczema                                ***  Food allergy                          *** Allergic rhino conjunctivitis     ***  Review of Systems  Constitutional:  Negative for appetite change, chills, fever and unexpected weight change.  HENT:  Negative for congestion and rhinorrhea.   Eyes:  Negative for itching.  Respiratory:  Negative for cough, chest tightness, shortness of breath and wheezing.   Cardiovascular:  Negative for chest pain.  Gastrointestinal:  Negative for abdominal pain.  Genitourinary:  Negative for difficulty urinating.  Skin:  Negative for rash.  Neurological:  Negative for headaches.    Objective: There were no vitals taken for this visit. There is no height or weight on file to calculate BMI. Physical Exam Vitals and nursing note reviewed.  Constitutional:      Appearance: Normal appearance. He is well-developed.  HENT:     Head: Normocephalic and atraumatic.     Right Ear: Tympanic membrane and external ear normal.     Left Ear: Tympanic membrane and external ear normal.     Nose: Nose normal.     Mouth/Throat:     Mouth: Mucous membranes are moist.     Pharynx:  Oropharynx is clear.  Eyes:     Conjunctiva/sclera: Conjunctivae normal.  Cardiovascular:     Rate and Rhythm: Normal rate and regular rhythm.     Heart sounds: Normal heart sounds. No murmur heard.    No friction rub. No gallop.  Pulmonary:     Effort: Pulmonary effort is normal.     Breath sounds: Normal breath sounds. No wheezing, rhonchi or rales.  Musculoskeletal:     Cervical back: Neck supple.  Skin:    General: Skin is warm.     Findings: No rash.  Neurological:     Mental Status: He is alert and oriented to person, place, and time.  Psychiatric:        Behavior: Behavior normal.    The plan was reviewed with the patient/family, and all questions/concerned were addressed.  It was my pleasure to see Richard Franco today and participate in his care. Please feel free to contact me with any questions or concerns.  Sincerely,  Rexene Alberts, DO Allergy & Immunology  [facility] office: [place] office: [phone]

## 2021-12-19 NOTE — Telephone Encounter (Signed)
Left message for pt to call back  °

## 2021-12-19 NOTE — Telephone Encounter (Signed)
Patient is returning your call.  

## 2021-12-20 ENCOUNTER — Encounter: Payer: Self-pay | Admitting: Allergy

## 2021-12-20 ENCOUNTER — Ambulatory Visit: Payer: Medicare Other | Admitting: Allergy

## 2021-12-27 NOTE — Telephone Encounter (Signed)
Pt did not return call from second message. Will await further communication from pt.

## 2022-01-16 ENCOUNTER — Emergency Department (HOSPITAL_COMMUNITY): Payer: Medicare Other

## 2022-01-16 ENCOUNTER — Encounter (HOSPITAL_COMMUNITY): Payer: Self-pay | Admitting: Emergency Medicine

## 2022-01-16 ENCOUNTER — Other Ambulatory Visit: Payer: Self-pay

## 2022-01-16 ENCOUNTER — Emergency Department (HOSPITAL_COMMUNITY)
Admission: EM | Admit: 2022-01-16 | Discharge: 2022-01-16 | Disposition: A | Discharge status: Home / Self Care | Payer: Medicare Other | Attending: Emergency Medicine | Admitting: Emergency Medicine

## 2022-01-16 DIAGNOSIS — I2510 Atherosclerotic heart disease of native coronary artery without angina pectoris: Secondary | ICD-10-CM | POA: Diagnosis not present

## 2022-01-16 DIAGNOSIS — R7989 Other specified abnormal findings of blood chemistry: Secondary | ICD-10-CM | POA: Insufficient documentation

## 2022-01-16 DIAGNOSIS — R200 Anesthesia of skin: Secondary | ICD-10-CM | POA: Diagnosis not present

## 2022-01-16 DIAGNOSIS — Z7982 Long term (current) use of aspirin: Secondary | ICD-10-CM | POA: Insufficient documentation

## 2022-01-16 DIAGNOSIS — R079 Chest pain, unspecified: Secondary | ICD-10-CM

## 2022-01-16 DIAGNOSIS — R61 Generalized hyperhidrosis: Secondary | ICD-10-CM | POA: Insufficient documentation

## 2022-01-16 DIAGNOSIS — R202 Paresthesia of skin: Secondary | ICD-10-CM | POA: Diagnosis not present

## 2022-01-16 DIAGNOSIS — R42 Dizziness and giddiness: Secondary | ICD-10-CM | POA: Insufficient documentation

## 2022-01-16 DIAGNOSIS — R0789 Other chest pain: Secondary | ICD-10-CM | POA: Insufficient documentation

## 2022-01-16 LAB — TROPONIN I (HIGH SENSITIVITY)
Troponin I (High Sensitivity): 3 ng/L (ref <18)
Troponin I (High Sensitivity): 3 ng/L (ref <18)

## 2022-01-16 LAB — BASIC METABOLIC PANEL
Anion gap: 7 (ref 5–15)
BUN: 15 mg/dL (ref 6–20)
CO2: 30 mmol/L (ref 22–32)
Calcium: 9.6 mg/dL (ref 8.9–10.3)
Chloride: 101 mmol/L (ref 98–111)
Creatinine, Ser: 1.35 mg/dL — ABNORMAL HIGH (ref 0.61–1.24)
GFR, Estimated: >60 mL/min (ref >60)
Glucose, Bld: 101 mg/dL — ABNORMAL HIGH (ref 70–99)
Potassium: 4.9 mmol/L (ref 3.5–5.1)
Sodium: 138 mmol/L (ref 135–145)

## 2022-01-16 LAB — CBC
HCT: 40.5 % (ref 39.0–52.0)
Hemoglobin: 13.6 g/dL (ref 13.0–17.0)
MCH: 30 pg (ref 26.0–34.0)
MCHC: 33.6 g/dL (ref 30.0–36.0)
MCV: 89.2 fL (ref 80.0–100.0)
Platelets: 210 10*3/uL (ref 150–400)
RBC: 4.54 MIL/uL (ref 4.22–5.81)
RDW: 12.5 % (ref 11.5–15.5)
WBC: 5.7 10*3/uL (ref 4.0–10.5)
nRBC: 0 % (ref 0.0–0.2)

## 2022-01-16 MED ORDER — NITROGLYCERIN 0.4 MG SL SUBL
0.4000 mg | SUBLINGUAL_TABLET | Freq: Once | SUBLINGUAL | Status: AC
  Administered 2022-01-16: 0.4 mg via SUBLINGUAL
  Filled 2022-01-16: qty 1

## 2022-01-16 MED ORDER — NITROGLYCERIN 0.4 MG SL SUBL: 0.4000 mg | SUBLINGUAL_TABLET | SUBLINGUAL | 0 refills | Status: AC | PRN

## 2022-01-16 NOTE — ED Triage Notes (Signed)
Patient here with compliant of left arm numbness and chest pain, exacerbated by exertion. Patient is alert and in no apparent distress at this time.

## 2022-01-16 NOTE — ED Provider Notes (Signed)
Care assumed from previous provider.  See note for full HPI.  56 year old here for evaluation of chest pain.  Has chronic daily chest pain at baseline.  Today's episode of loss a little longer than normal.  Had some tingling to his left arm.  Tingling resolved PTA.  Per previous provider patient is nonfocal neuro exam without deficits.  Chest pain has improved.  He has been out of his nitroglycerin at home.  He follows with cardiology.  He had negative stress test 1 year ago.  Plan to follow-up on delta troponin.  If negative DC home with nature refill and close follow-up outpatient Physical Exam  BP 119/80   Pulse 74   Temp (!) 97.5 F (36.4 C) (Oral)   Resp 13   Ht '5\' 8"'$  (1.727 m)   Wt 72.1 kg   SpO2 98%   BMI 24.18 kg/m   Physical Exam  Procedures  Procedures Labs Reviewed  BASIC METABOLIC PANEL - Abnormal; Notable for the following components:      Result Value   Glucose, Bld 101 (*)    Creatinine, Ser 1.35 (*)    All other components within normal limits  CBC  TROPONIN I (HIGH SENSITIVITY)  TROPONIN I (HIGH SENSITIVITY)   CT Head Wo Contrast  Result Date: 01/16/2022 CLINICAL DATA:  Left arm numbness, chest pain EXAM: CT HEAD WITHOUT CONTRAST TECHNIQUE: Contiguous axial images were obtained from the base of the skull through the vertex without intravenous contrast. RADIATION DOSE REDUCTION: This exam was performed according to the departmental dose-optimization program which includes automated exposure control, adjustment of the mA and/or kV according to patient size and/or use of iterative reconstruction technique. COMPARISON:  10/17/2006 FINDINGS: Brain: No evidence of acute infarction, hemorrhage, mass, mass effect, or midline shift. No hydrocephalus or extra-axial fluid collection. Vascular: No hyperdense vessel. Skull: Negative for fracture or focal lesion. Partially imaged cervical hardware at C2 with some lucency about the right C2 screw. Sinuses/Orbits: No acute finding.  Other: The mastoid air cells are well aerated. IMPRESSION: 1.  No acute intracranial process. 2. Lucency about the right C2 screw, which can indicate loosening. Electronically Signed   By: Merilyn Baba M.D.   On: 01/16/2022 12:05   DG Chest 1 View  Result Date: 01/16/2022 CLINICAL DATA:  Dizziness and chest pain EXAM: CHEST  1 VIEW COMPARISON:  Radiographs 09/16/2021 FINDINGS: The heart size and mediastinal contours are within normal limits. Both lungs are clear. The visualized skeletal structures are unremarkable. Cervical spine fusion hardware. IMPRESSION: No active disease. Electronically Signed   By: Placido Sou M.D.   On: 01/16/2022 11:56    ED Course / MDM   Clinical Course as of 01/16/22 1545  Wed Jan 16, 2022  1513 CP this morning. Pending Trop. Heart score 2 [BH]    Clinical Course User Index [BH] Rayne Cowdrey A, PA-C   Medical Decision Making Amount and/or Complexity of Data Reviewed External Data Reviewed: labs, radiology, ECG and notes. Labs: ordered. Decision-making details documented in ED Course. Radiology: ordered and independent interpretation performed. Decision-making details documented in ED Course. ECG/medicine tests: ordered and independent interpretation performed. Decision-making details documented in ED Course.  Risk OTC drugs. Prescription drug management. Decision regarding hospitalization. Diagnosis or treatment significantly limited by social determinants of health.    Care assumed from previous provider.  In summation 56 year old who has chronic chest pain typically takes nitro at home however has been out here for evaluation of chest pain this morning which  lasted longer than his typical pain.  EKG without ischemic changes.  No symptoms to suggest unstable angina, dissection, PE, infectious process, myocarditis or pericarditis  Plan to follow-up on delta troponin, if negative DC home with refill of his nitro  Labs and imaging personally viewed  and interpreted Delta troponin flat  DC home with his home nitro prescription, close follow with cardiology, return for new or worsening symptoms.      Perlita Forbush A, PA-C 01/16/22 1545    Isla Pence, MD 01/16/22 2155

## 2022-01-16 NOTE — ED Provider Notes (Signed)
[facility] EMERGENCY DEPARTMENT Provider Note   CSN: [id number] Arrival date & time: 01/16/22  1114     History  Chief Complaint  Patient presents with   Chest Pain    Richard Franco is a 56 y.o. male.  Richard Franco is a 56 year old male with past medical history of GERD, hypercholesteremia, depression anxiety and coronary artery disease presenting today with chest pain reports that his chest pain and sweating began this morning at 630 just after he awoke from sleep.  Pain is sharp in nature and localized over the left chest.  Patient did want to take his prescribed nitroglycerin but was unable to find.  Did take a Percocet which helped a little.  Chest pain has been constant since this morning.  4 hours later endorses left arm numbness loss of sensation and range of motion.  Reports that his 10 years ago he had "heart attack".  Reported that he went to the Cath Lab but his coronary arteries were "both clean".  Reports that he has had 9 cervical spine fusions.  Last fusion was 1.5 years ago.   Chest Pain     Home Medications Prior to Admission medications   Medication Sig Start Date End Date Taking? Authorizing Provider  nitroGLYCERIN (NITROSTAT) 0.4 MG SL tablet Place 1 tablet (0.4 mg total) under the tongue every 5 (five) minutes as needed for chest pain. 01/16/22  Yes Harriet Pho, PA-C  aspirin 81 MG EC tablet Take 1 tablet (81 mg total) by mouth daily. Restart on 12/13/20 Patient taking differently: Take 81 mg by mouth daily. 12/08/20   Judith Part, MD  EPINEPHrine 0.3 mg/0.3 mL IJ SOAJ injection Inject 0.3 mg into the muscle once as needed for anaphylaxis. 07/14/19   [provider]  esomeprazole (NEXIUM) 40 MG capsule Take 1 capsule (40 mg total) by mouth 2 (two) times daily before a meal. 11/29/21   Lemmon, Lavone Nian, PA  FLUoxetine (PROZAC) 40 MG capsule Take 40 mg by mouth at bedtime.    [provider]  lamoTRIgine (LAMICTAL) 200  MG tablet Take 400 mg by mouth at bedtime.    [provider]  oxyCODONE-acetaminophen (PERCOCET) 10-325 MG tablet Take 1 tablet by mouth every 6 (six) hours. 11/22/20   [provider]  QUEtiapine (SEROQUEL) 50 MG tablet Take 50 mg by mouth at bedtime as needed (for sleep). 04/16/20   [provider]  tiZANidine (ZANAFLEX) 4 MG tablet Take 4 mg by mouth every 6 (six) hours as needed for muscle spasms.    [provider]      Allergies    Midodrine hcl, Other, Sulfa antibiotics, and Doxycycline    Review of Systems   Review of Systems  Cardiovascular:  Positive for chest pain.  Musculoskeletal:        Endorses left arm tingling, loss of sensation and loss of ROM    Physical Exam Updated Vital Signs BP 119/80   Pulse 74   Temp (!) 97.5 F (36.4 C) (Oral)   Resp 13   Ht '5\' 8"'$  (1.727 m)   Wt 72.1 kg   SpO2 98%   BMI 24.18 kg/m  Physical Exam Vitals and nursing note reviewed.  HENT:     Head: Normocephalic and atraumatic.     Mouth/Throat:     Mouth: Mucous membranes are moist.  Eyes:     General:        Right eye: No discharge.  Left eye: No discharge.     Conjunctiva/sclera: Conjunctivae normal.  Cardiovascular:     Rate and Rhythm: Normal rate and regular rhythm.     Pulses: Normal pulses.          Radial pulses are 2+ on the right side and 2+ on the left side.     Heart sounds: Normal heart sounds. No murmur heard.    No gallop.  Pulmonary:     Effort: Pulmonary effort is normal.     Breath sounds: Normal breath sounds.  Chest:     Chest wall: No mass or deformity.  Abdominal:     General: Abdomen is flat.     Palpations: Abdomen is soft.  Musculoskeletal:        General: Normal range of motion.  Skin:    General: Skin is warm and dry.  Neurological:     General: No focal deficit present.  Psychiatric:        Mood and Affect: Mood normal.     ED Results / Procedures / Treatments   Labs (all labs ordered are  listed, but only abnormal results are displayed) Labs Reviewed  BASIC METABOLIC PANEL - Abnormal; Notable for the following components:      Result Value   Glucose, Bld 101 (*)    Creatinine, Ser 1.35 (*)    All other components within normal limits  CBC  TROPONIN I (HIGH SENSITIVITY)  TROPONIN I (HIGH SENSITIVITY)    EKG EKG Interpretation  Date/Time:  Wednesday January 16 2022 11:26:47 EDT Ventricular Rate:  71 PR Interval:  124 QRS Duration: 84 QT Interval:  416 QTC Calculation: 452 R Axis:   77 Text Interpretation: Normal sinus rhythm Normal ECG When compared with ECG of 03-Jun-2020 13:05, PREVIOUS ECG IS PRESENT Confirmed by Noemi Chapel [phone]) on 01/16/2022 1:51:53 PM  Radiology CT Head Wo Contrast  Result Date: 01/16/2022 CLINICAL DATA:  Left arm numbness, chest pain EXAM: CT HEAD WITHOUT CONTRAST TECHNIQUE: Contiguous axial images were obtained from the base of the skull through the vertex without intravenous contrast. RADIATION DOSE REDUCTION: This exam was performed according to the departmental dose-optimization program which includes automated exposure control, adjustment of the mA and/or kV according to patient size and/or use of iterative reconstruction technique. COMPARISON:  10/17/2006 FINDINGS: Brain: No evidence of acute infarction, hemorrhage, mass, mass effect, or midline shift. No hydrocephalus or extra-axial fluid collection. Vascular: No hyperdense vessel. Skull: Negative for fracture or focal lesion. Partially imaged cervical hardware at C2 with some lucency about the right C2 screw. Sinuses/Orbits: No acute finding. Other: The mastoid air cells are well aerated. IMPRESSION: 1.  No acute intracranial process. 2. Lucency about the right C2 screw, which can indicate loosening. Electronically Signed   By: Merilyn Baba M.D.   On: 01/16/2022 12:05   DG Chest 1 View  Result Date: 01/16/2022 CLINICAL DATA:  Dizziness and chest pain EXAM: CHEST  1 VIEW COMPARISON:   Radiographs 09/16/2021 FINDINGS: The heart size and mediastinal contours are within normal limits. Both lungs are clear. The visualized skeletal structures are unremarkable. Cervical spine fusion hardware. IMPRESSION: No active disease. Electronically Signed   By: Placido Sou M.D.   On: 01/16/2022 11:56       Medications Ordered in ED Medications - No data to display  ED Course/ Medical Decision Making/ A&P Clinical Course as of 01/16/22 1534  Wed Jan 16, 2022  1513 CP this morning. Pending Trop. Heart score 2 [BH]  Clinical Course User Index [BH] Henderly, Britni A, PA-C                           Medical Decision Making This patient presents to the ED for concern of chest pain, this involves a number of treatment options, and is a complaint that carries with it a moderate risk of complications and morbidity. The differential diagnosis includes ACS, pulmonary embolism/pneumothorax, and MSK. Considered pneumothorax/PE but breathing is non-labored, normal vitals, and CXR is normal so less likely. Leading concern is ACS given history. Left arm pain/loss of sensation and ROM could be related to ACS but my suspicion is transient radiculopathy related to multiple cervical spinal fusions.  ACS: Ordered EKG and serial troponins. Arm numbness/loss of sensation: physical exam revealed normal strength, sensation, and ROM. Ordered CT scan.   Co morbidities: Discussed in HPI   EMR reviewed including pt PMHx, past surgical history and past visits to ER.   See HPI for more details   Lab Tests:   I ordered and independently interpreted labs. Labs notable for elevated serum creatinine.   Imaging Studies:  CXR reviewed and revealed no acute findings.      Cardiac Monitoring:       The patient was maintained on a cardiac monitor.  I personally viewed and interpreted the cardiac monitored which showed an underlying rhythm of: sinus rhythm      EKG non-ischemic   Medicines  ordered:  I ordered medication including nitroglycerin  for anginal symptoms at home. Reevaluation of the patient after these medicines showed that the patient stayed the same I have reviewed the patients home medicines and have made adjustments as needed    Reevaluation:  After the interventions noted above I re-evaluated patient and found that they have :stayed the same    Problem List / ED Course:  Patient presented with chest pain that is concerning for ACS etiology given his history and presentation. EKG was normal. Initial troponin was WNL.Heart score was 2. Plan will be to recheck one more troponin. If within normal range, he is appropriate to discharge home with close f/u with cardiologist. With regards to arm numbness/loss of sensation, considered stroke but CT was unremarkable and no other focal deficits appreciated on exam. Could be radiculopathy r/t history of mutiple cervical spinal fusions. Physical exam of the left arm was also reassuring with normal strength, ROM, and sensation.    Dispostion:  After consideration of the diagnostic results and the patients response to treatment, I feel that the patient would benefit from close f/u with cardiologist and nitroglycerin for symptomatic relief of his likely stable angina.    Amount and/or Complexity of Data Reviewed External Data Reviewed: labs, ECG and notes. Labs:  Decision-making details documented in ED Course. Radiology:  Decision-making details documented in ED Course. ECG/medicine tests:  Decision-making details documented in ED Course.         Final Clinical Impression(s) / ED Diagnoses Final diagnoses:  Chest pain, unspecified type    Rx / DC Orders ED Discharge Orders          Ordered    nitroGLYCERIN (NITROSTAT) 0.4 MG SL tablet  Every 5 min PRN        01/16/22 1527              Harriet Pho, PA-C 01/16/22 1537    Noemi Chapel, MD 01/17/22 [phone]

## 2022-01-16 NOTE — ED Provider Triage Note (Signed)
Emergency Medicine Provider Triage Evaluation Note  Richard Franco , a 56 y.o. male  was evaluated in triage.  Pt complains of dizziness, chest pain and left arm numbness.  Patient reports that yesterday around 9 or 10 AM he began feeling dizzy.  The patient states that this resolved on its own over the course the day.  Patient states that this morning he woke up with chest pain but he states that this is normal for him, he has a diagnosis of angina.  The patient states that throughout the course of today the chest pain is worsened and he began having left arm pain and the numbness around 1030.  Patient states that the chest pain is associated with shortness of breath, worse with exertion.  There was questionable left-sided facial droop and decreased grip strength initially.  On my examination, the patient has no facial droop, he has 4-5 grip strength of the left arm 5 out of 5 to the right.  No pronator drift.  Patient states LKN was 9:30AM yesterday.  Chest pain work-up initiated as well as CT head.  Review of Systems  Positive:  Negative:   Physical Exam  BP (!) 148/84   Pulse 73   Temp 98.1 F (36.7 C) (Oral)   Resp 18   SpO2 97%  Gen:   Awake, no distress   Resp:  Normal effort  MSK:   Moves extremities without difficulty  Other:  No facial droop on examination.  Decreased grip strength 4/5 to the left upper extremity.  Bilateral lower extremities 5 out of 5 strength.  The patient does have decreased sensation to his left upper extremity.  Medical Decision Making  Medically screening exam initiated at 11:32 AM.  Appropriate orders placed.  SABER DICKERMAN was informed that the remainder of the evaluation will be completed by another provider, this initial triage assessment does not replace that evaluation, and the importance of remaining in the ED until their evaluation is complete.     Azucena Cecil, PA-C 01/16/22 1135

## 2022-01-16 NOTE — Discharge Instructions (Signed)
Chest pain is likely associated with stable angina.  EKG today was normal troponins were within normal limits which is reassuring.  Ordered refill for nitroglycerin tablets.  Recommend close follow-up with cardiologist.

## 2022-01-16 NOTE — ED Provider Notes (Signed)
This patient is a very pleasant 56 year old male with no prior history of obstructive coronary disease.  He does have a history of having some type of angina which was thought to be vasospasm based on a non-ST elevation MI and a clean coronary cath that happened about 10 years ago.  He follows with cardiology and in fact he had a negative stress test about 1 year ago.  The patient reports to me that today he has been having some chest discomfort and actually states he has chest pain about once or twice a week very consistently over years.  Today the pain is a little bit worse and while he was driving in his car he felt like his left hand and arm went numb.  That is completely gone back to normal though he still has some residual chest pain.  Despite this his EKG is unremarkable his initial troponin was undetectable, second troponin is pending and I anticipate the patient will be able to be discharged if the second troponin comes back normal.  The patient was informed of this plan and is in total agreement with the plan.  We will refill his nitroglycerin and have him follow-up with cardiology.  The patient expressed his understanding.  He is very stable appearing at this time with a heart rate of 70 a blood pressure of 136/90, he is afebrile with an oxygen of 100%, labs have been unremarkable and chest x-ray unremarkable.  At change of shift care signed out to [facility] PA-C to follow-up second troponin and disposition accordingly.  Final diagnoses:  Chest pain, unspecified type      Noemi Chapel, MD 01/17/22 [phone]

## 2022-02-27 ENCOUNTER — Other Ambulatory Visit: Payer: Self-pay | Admitting: Physician Assistant

## 2022-03-17 ENCOUNTER — Other Ambulatory Visit: Payer: Self-pay

## 2022-03-17 ENCOUNTER — Emergency Department (HOSPITAL_BASED_OUTPATIENT_CLINIC_OR_DEPARTMENT_OTHER)
Admission: EM | Admit: 2022-03-17 | Discharge: 2022-03-17 | Disposition: A | Discharge status: Home / Self Care | Payer: Medicare Other | Attending: Emergency Medicine | Admitting: Emergency Medicine

## 2022-03-17 ENCOUNTER — Encounter (HOSPITAL_BASED_OUTPATIENT_CLINIC_OR_DEPARTMENT_OTHER): Payer: Self-pay

## 2022-03-17 ENCOUNTER — Emergency Department (HOSPITAL_BASED_OUTPATIENT_CLINIC_OR_DEPARTMENT_OTHER): Payer: Medicare Other

## 2022-03-17 DIAGNOSIS — I10 Essential (primary) hypertension: Secondary | ICD-10-CM | POA: Diagnosis not present

## 2022-03-17 DIAGNOSIS — Z7982 Long term (current) use of aspirin: Secondary | ICD-10-CM | POA: Insufficient documentation

## 2022-03-17 DIAGNOSIS — N200 Calculus of kidney: Secondary | ICD-10-CM | POA: Diagnosis not present

## 2022-03-17 DIAGNOSIS — R109 Unspecified abdominal pain: Secondary | ICD-10-CM | POA: Diagnosis present

## 2022-03-17 LAB — CBC WITH DIFFERENTIAL/PLATELET
Abs Immature Granulocytes: 0.01 10*3/uL (ref 0.00–0.07)
Basophils Absolute: 0 10*3/uL (ref 0.0–0.1)
Basophils Relative: 0 %
Eosinophils Absolute: 0.1 10*3/uL (ref 0.0–0.5)
Eosinophils Relative: 2 %
HCT: 38.7 % — ABNORMAL LOW (ref 39.0–52.0)
Hemoglobin: 13.3 g/dL (ref 13.0–17.0)
Immature Granulocytes: 0 %
Lymphocytes Relative: 25 %
Lymphs Abs: 1.2 10*3/uL (ref 0.7–4.0)
MCH: 30.1 pg (ref 26.0–34.0)
MCHC: 34.4 g/dL (ref 30.0–36.0)
MCV: 87.6 fL (ref 80.0–100.0)
Monocytes Absolute: 0.4 10*3/uL (ref 0.1–1.0)
Monocytes Relative: 9 %
Neutro Abs: 3 10*3/uL (ref 1.7–7.7)
Neutrophils Relative %: 64 %
Platelets: 230 10*3/uL (ref 150–400)
RBC: 4.42 MIL/uL (ref 4.22–5.81)
RDW: 13.3 % (ref 11.5–15.5)
WBC: 4.7 10*3/uL (ref 4.0–10.5)
nRBC: 0 % (ref 0.0–0.2)

## 2022-03-17 LAB — URINALYSIS, ROUTINE W REFLEX MICROSCOPIC
Bilirubin Urine: NEGATIVE
Glucose, UA: NEGATIVE mg/dL
Ketones, ur: NEGATIVE mg/dL
Leukocytes,Ua: NEGATIVE
Nitrite: NEGATIVE
Protein, ur: NEGATIVE mg/dL
Specific Gravity, Urine: 1.02 (ref 1.005–1.030)
pH: 5.5 (ref 5.0–8.0)

## 2022-03-17 LAB — URINALYSIS, MICROSCOPIC (REFLEX)

## 2022-03-17 LAB — COMPREHENSIVE METABOLIC PANEL
ALT: 23 U/L (ref 0–44)
AST: 27 U/L (ref 15–41)
Albumin: 4.1 g/dL (ref 3.5–5.0)
Alkaline Phosphatase: 66 U/L (ref 38–126)
Anion gap: 6 (ref 5–15)
BUN: 10 mg/dL (ref 6–20)
CO2: 25 mmol/L (ref 22–32)
Calcium: 9.2 mg/dL (ref 8.9–10.3)
Chloride: 108 mmol/L (ref 98–111)
Creatinine, Ser: 1.13 mg/dL (ref 0.61–1.24)
GFR, Estimated: >60 mL/min (ref >60)
Glucose, Bld: 117 mg/dL — ABNORMAL HIGH (ref 70–99)
Potassium: 3.8 mmol/L (ref 3.5–5.1)
Sodium: 139 mmol/L (ref 135–145)
Total Bilirubin: 0.7 mg/dL (ref 0.3–1.2)
Total Protein: 6.9 g/dL (ref 6.5–8.1)

## 2022-03-17 LAB — LIPASE, BLOOD: Lipase: 32 U/L (ref 11–51)

## 2022-03-17 MED ORDER — ONDANSETRON HCL 4 MG/2ML IJ SOLN
4.0000 mg | Freq: Once | INTRAMUSCULAR | Status: AC
  Administered 2022-03-17: 4 mg via INTRAVENOUS
  Filled 2022-03-17: qty 2

## 2022-03-17 MED ORDER — SODIUM CHLORIDE 0.9 % IV BOLUS: 1000.0000 mL | Freq: Once | INTRAVENOUS | Status: DC

## 2022-03-17 MED ORDER — SODIUM CHLORIDE 0.9 % IV BOLUS
1000.0000 mL | Freq: Once | INTRAVENOUS | Status: AC
  Administered 2022-03-17: 1000 mL via INTRAVENOUS

## 2022-03-17 MED ORDER — MORPHINE SULFATE (PF) 4 MG/ML IV SOLN
6.0000 mg | Freq: Once | INTRAVENOUS | Status: AC
  Administered 2022-03-17: 6 mg via INTRAVENOUS
  Filled 2022-03-17: qty 2

## 2022-03-17 MED ORDER — ONDANSETRON HCL 4 MG PO TABS: 4.0000 mg | ORAL_TABLET | Freq: Four times a day (QID) | ORAL | 0 refills | Status: DC

## 2022-03-17 MED ORDER — MORPHINE SULFATE (PF) 4 MG/ML IV SOLN
4.0000 mg | Freq: Once | INTRAVENOUS | Status: AC
  Administered 2022-03-17: 4 mg via INTRAVENOUS
  Filled 2022-03-17: qty 1

## 2022-03-17 MED ORDER — KETOROLAC TROMETHAMINE 15 MG/ML IJ SOLN
15.0000 mg | Freq: Once | INTRAMUSCULAR | Status: AC
  Administered 2022-03-17: 15 mg via INTRAVENOUS
  Filled 2022-03-17: qty 1

## 2022-03-17 MED ORDER — HYDROMORPHONE HCL 1 MG/ML IJ SOLN
1.0000 mg | Freq: Once | INTRAMUSCULAR | Status: AC
  Administered 2022-03-17: 1 mg via INTRAVENOUS
  Filled 2022-03-17: qty 1

## 2022-03-17 MED ORDER — KETOROLAC TROMETHAMINE 15 MG/ML IJ SOLN
15.0000 mg | Freq: Once | INTRAMUSCULAR | Status: AC
Start: 2022-03-17 — End: 2022-03-17
  Administered 2022-03-17: 15 mg via INTRAVENOUS
  Filled 2022-03-17: qty 1

## 2022-03-17 MED ORDER — OXYCODONE HCL 5 MG PO TABS: 5.0000 mg | ORAL_TABLET | Freq: Three times a day (TID) | ORAL | 0 refills | Status: DC | PRN

## 2022-03-17 MED ORDER — TAMSULOSIN HCL 0.4 MG PO CAPS: 0.4000 mg | ORAL_CAPSULE | Freq: Every day | ORAL | 0 refills | Status: AC

## 2022-03-17 MED ORDER — IBUPROFEN 800 MG PO TABS: 800.0000 mg | ORAL_TABLET | Freq: Three times a day (TID) | ORAL | 0 refills | Status: DC

## 2022-03-17 MED ORDER — HYDROMORPHONE HCL 1 MG/ML IJ SOLN
0.5000 mg | Freq: Once | INTRAMUSCULAR | Status: AC
  Administered 2022-03-17: 0.5 mg via INTRAVENOUS
  Filled 2022-03-17: qty 1

## 2022-03-17 NOTE — ED Triage Notes (Signed)
Pt c/o abdominal pain that started yesterday at 1700 and woke up this morning with left flank pain.

## 2022-03-17 NOTE — ED Provider Notes (Addendum)
[facility] EMERGENCY DEPARTMENT Provider Note   CSN: [id number] Arrival date & time: 03/17/22  0857     History  Chief Complaint  Patient presents with   Flank Pain   Abdominal Pain    Richard Franco is a 56 y.o. male.  Patient here with left flank pain that started about 2 hours ago.  History of kidney stones.  Nothing makes it worse or better.  Denies any diarrhea, fever, chills.  He is feeling nauseous.  Patient with history of fibromyalgia, polysubstance abuse, hypertension, high cholesterol.  Denies any pain with urination.  Denies any obvious blood in the urine.  The history is provided by the patient.       Home Medications Prior to Admission medications   Medication Sig Start Date End Date Taking? Authorizing Provider  ibuprofen (ADVIL) 800 MG tablet Take 1 tablet (800 mg total) by mouth 3 (three) times daily. 03/17/22  Yes Tisha Cline, DO  ondansetron (ZOFRAN) 4 MG tablet Take 1 tablet (4 mg total) by mouth every 6 (six) hours. 03/17/22  Yes Dontavia Brand, DO  oxyCODONE (ROXICODONE) 5 MG immediate release tablet Take 1 tablet (5 mg total) by mouth every 8 (eight) hours as needed for up to 10 doses for breakthrough pain. 03/17/22  Yes Lupe Bonner, DO  tamsulosin (FLOMAX) 0.4 MG CAPS capsule Take 1 capsule (0.4 mg total) by mouth daily for 7 days. 03/17/22 03/24/22 Yes Jaxten Brosh, DO  aspirin 81 MG EC tablet Take 1 tablet (81 mg total) by mouth daily. Restart on 12/13/20 Patient taking differently: Take 81 mg by mouth daily. 12/08/20   Judith Part, MD  EPINEPHrine 0.3 mg/0.3 mL IJ SOAJ injection Inject 0.3 mg into the muscle once as needed for anaphylaxis. 07/14/19   [provider]  esomeprazole (NEXIUM) 40 MG capsule TAKE 1 CAPSULE (40 MG TOTAL) BY MOUTH 2 (TWO) TIMES DAILY BEFORE A MEAL. 02/27/22   Levin Erp, PA  FLUoxetine (PROZAC) 40 MG capsule Take 40 mg by mouth at bedtime.    [provider]  lamoTRIgine  (LAMICTAL) 200 MG tablet Take 400 mg by mouth at bedtime.    [provider]  nitroGLYCERIN (NITROSTAT) 0.4 MG SL tablet Place 1 tablet (0.4 mg total) under the tongue every 5 (five) minutes as needed for chest pain. 01/16/22   Harriet Pho, PA-C  oxyCODONE-acetaminophen (PERCOCET) 10-325 MG tablet Take 1 tablet by mouth every 6 (six) hours. 11/22/20   [provider]  QUEtiapine (SEROQUEL) 50 MG tablet Take 50 mg by mouth at bedtime as needed (for sleep). 04/16/20   [provider]  tiZANidine (ZANAFLEX) 4 MG tablet Take 4 mg by mouth every 6 (six) hours as needed for muscle spasms.    [provider]      Allergies    Midodrine hcl, Other, Sulfa antibiotics, and Doxycycline    Review of Systems   Review of Systems  Physical Exam Updated Vital Signs BP 126/77   Pulse 85   Temp 98.3 F (36.8 C) (Oral)   Resp 18   Ht '5\' 8"'$  (1.727 m)   Wt 72.6 kg   SpO2 98%   BMI 24.33 kg/m  Physical Exam Vitals and nursing note reviewed.  Constitutional:      General: He is not in acute distress.    Appearance: He is well-developed.  HENT:     Head: Normocephalic and atraumatic.     Nose: Nose normal.  Mouth/Throat:     Mouth: Mucous membranes are moist.  Eyes:     Extraocular Movements: Extraocular movements intact.     Conjunctiva/sclera: Conjunctivae normal.     Pupils: Pupils are equal, round, and reactive to light.  Cardiovascular:     Rate and Rhythm: Normal rate and regular rhythm.     Pulses: Normal pulses.     Heart sounds: Normal heart sounds. No murmur heard. Pulmonary:     Effort: Pulmonary effort is normal. No respiratory distress.     Breath sounds: Normal breath sounds.  Abdominal:     Palpations: Abdomen is soft.     Tenderness: There is no abdominal tenderness.  Musculoskeletal:        General: No swelling.     Cervical back: Neck supple.  Skin:    General: Skin is warm and dry.     Capillary Refill: Capillary refill takes  less than 2 seconds.  Neurological:     Mental Status: He is alert.  Psychiatric:        Mood and Affect: Mood normal.     ED Results / Procedures / Treatments   Labs (all labs ordered are listed, but only abnormal results are displayed) Labs Reviewed  CBC WITH DIFFERENTIAL/PLATELET - Abnormal; Notable for the following components:      Result Value   HCT 38.7 (*)    All other components within normal limits  COMPREHENSIVE METABOLIC PANEL - Abnormal; Notable for the following components:   Glucose, Bld 117 (*)    All other components within normal limits  LIPASE, BLOOD  URINALYSIS, ROUTINE W REFLEX MICROSCOPIC    EKG None  Radiology CT Renal Stone Study  Result Date: 03/17/2022 CLINICAL DATA:  Left flank pain EXAM: CT ABDOMEN AND PELVIS WITHOUT CONTRAST TECHNIQUE: Multidetector CT imaging of the abdomen and pelvis was performed following the standard protocol without IV contrast. RADIATION DOSE REDUCTION: This exam was performed according to the departmental dose-optimization program which includes automated exposure control, adjustment of the mA and/or kV according to patient size and/or use of iterative reconstruction technique. COMPARISON:  12/03/2021 FINDINGS: Lower chest: No acute abnormality. Hepatobiliary: Unremarkable unenhanced appearance of the liver. No focal liver lesion identified. Gallbladder within normal limits. No hyperdense gallstone. No biliary dilatation. Pancreas: Unremarkable. No pancreatic ductal dilatation or surrounding inflammatory changes. Spleen: Normal in size without focal abnormality. Adrenals/Urinary Tract: Unremarkable adrenal glands. Obstructing 3 mm stone within the distal left ureter at the level of the pelvic sidewall resulting in mild hydronephrosis. Additional tiny punctate stone within the upper pole of the left kidney. Stable 2.1 cm exophytic left renal cyst which does not require follow-up imaging. Punctate nonobstructing stone in the upper pole  of the right kidney. No right-sided hydronephrosis. Urinary bladder within normal limits for the degree of distension. Stomach/Bowel: Stomach is within normal limits. Appendix is not visualized. Sigmoid diverticulosis. There is some submucosal fat deposition within the cecum and ascending colon, which can be seen in the setting of chronic inflammation. No evidence of bowel wall thickening, distention, or active inflammatory changes. Vascular/Lymphatic: Scattered aortoiliac atherosclerotic calcifications without aneurysm. No abdominopelvic lymphadenopathy. Reproductive: Prostate is unremarkable. Other: No free fluid. No abdominopelvic fluid collection. No pneumoperitoneum. No abdominal wall hernia. Musculoskeletal: No acute or significant osseous findings. IMPRESSION: 1. Obstructing 3 mm stone within the distal left ureter resulting in mild left hydronephrosis. 2. Additional punctate nonobstructing bilateral renal calculi. 3. Sigmoid diverticulosis without evidence of acute diverticulitis. 4. Aortic Atherosclerosis (ICD10-I70.0). Electronically Signed  By: Davina Poke D.O.   On: 03/17/2022 09:57    Procedures Procedures    Medications Ordered in ED Medications  sodium chloride 0.9 % bolus 1,000 mL (0 mLs Intravenous Stopped 03/17/22 1018)  HYDROmorphone (DILAUDID) injection 1 mg (1 mg Intravenous Given 03/17/22 0910)  ondansetron (ZOFRAN) injection 4 mg (4 mg Intravenous Given 03/17/22 0910)  ketorolac (TORADOL) 15 MG/ML injection 15 mg (15 mg Intravenous Given 03/17/22 0958)  HYDROmorphone (DILAUDID) injection 1 mg (1 mg Intravenous Given 03/17/22 0958)  morphine (PF) 4 MG/ML injection 6 mg (6 mg Intravenous Given 03/17/22 1014)  sodium chloride 0.9 % bolus 1,000 mL (1,000 mLs Intravenous New Bag/Given 03/17/22 1050)  morphine (PF) 4 MG/ML injection 4 mg (4 mg Intravenous Given 03/17/22 1117)    ED Course/ Medical Decision Making/ A&P                           Medical Decision Making Amount  and/or Complexity of Data Reviewed Labs: ordered. Radiology: ordered.  Risk Prescription drug management.   JEP DYAS is here with left flank pain.  History of kidney stone, polysubstance abuse, hypertension, high cholesterol, CAD.  Acute pain started 2 hours ago.  Differential diagnosis likely kidney stone versus pancreatitis versus less likely bowel obstruction, pyelonephritis, UTI.  We will get CBC, CMP, lipase, urinalysis, CT scan abdomen pelvis.  We will give a dose IV Dilaudid, IV Zofran and IV fluids and reevaluate.  Vital signs are unremarkable.  Per my review and interpretation of labs there is no significant leukocytosis or anemia or AKI.  Patient does have obstructing 3 mm kidney stone on the left.  Suspect that this is the cause of his pain.  He is having improvement with IV pain medication here.  Urinalysis does not appear to be consistent with infection, patient has no fever, no leukocytosis and no concern for infected kidney stone.  Patient feeling better after pain medications.  Discharged in good condition.  He understands return precautions.  This chart was dictated using voice recognition software.  Despite best efforts to proofread,  errors can occur which can change the documentation meaning.     Final Clinical Impression(s) / ED Diagnoses Final diagnoses:  Kidney stone    Rx / DC Orders ED Discharge Orders          Ordered    oxyCODONE (ROXICODONE) 5 MG immediate release tablet  Every 8 hours PRN        03/17/22 1007    ibuprofen (ADVIL) 800 MG tablet  3 times daily        03/17/22 1007    tamsulosin (FLOMAX) 0.4 MG CAPS capsule  Daily        03/17/22 1007    ondansetron (ZOFRAN) 4 MG tablet  Every 6 hours        03/17/22 1007              Cicely Ortner, DO 03/17/22 1243    Helen Cuff, [place], DO 03/17/22 1246

## 2022-03-17 NOTE — Discharge Instructions (Addendum)
I have prescribed you 800 mg tablets of ibuprofen to take every 8 hours as needed for pain.  Recommend 1000 mg of Tylenol every 6 hours as needed for pain.  Take your chronic narcotic medication as prescribed.  I have written you for breakthrough narcotic pain medicine called Roxicodone to take every 8 hours as needed for pain.  Please be careful when taking these medications to over sedate yourself.  I have also prescribed you Zofran for nausea and Flomax to help get the stone out.    Please return if you develop a fever greater than 101.  Would recommend going to [place] long if you feel like you need more pain control or if you develop fever.  Was along hospitals where urologist due to interventions for kidney stones as discussed.  Call Dr. Elmyra Ricks office in the morning to get close follow-up appointment for your kidney stone as well.  My hope is that kidney stone will pass sometime today or overnight.

## 2022-03-17 NOTE — ED Triage Notes (Signed)
Pain that at started about 1700 yesterday in his stomach woke up with pain in his flank area now.

## 2022-05-26 ENCOUNTER — Other Ambulatory Visit: Payer: Self-pay | Admitting: Physician Assistant

## 2022-09-13 ENCOUNTER — Other Ambulatory Visit: Payer: Self-pay | Admitting: Neurological Surgery

## 2022-09-13 DIAGNOSIS — M960 Pseudarthrosis after fusion or arthrodesis: Secondary | ICD-10-CM

## 2022-09-15 ENCOUNTER — Ambulatory Visit (INDEPENDENT_AMBULATORY_CARE_PROVIDER_SITE_OTHER): Payer: Medicare Other

## 2022-09-15 DIAGNOSIS — G8929 Other chronic pain: Secondary | ICD-10-CM | POA: Diagnosis not present

## 2022-09-15 DIAGNOSIS — M542 Cervicalgia: Secondary | ICD-10-CM | POA: Diagnosis not present

## 2022-09-15 DIAGNOSIS — M960 Pseudarthrosis after fusion or arthrodesis: Secondary | ICD-10-CM | POA: Diagnosis not present

## 2022-09-21 ENCOUNTER — Ambulatory Visit
Admission: EM | Admit: 2022-09-21 | Discharge: 2022-09-21 | Disposition: A | Discharge status: Home / Self Care | Payer: Medicare Other | Attending: Family Medicine | Admitting: Family Medicine

## 2022-09-21 ENCOUNTER — Encounter: Payer: Self-pay | Admitting: Emergency Medicine

## 2022-09-21 DIAGNOSIS — S51812A Laceration without foreign body of left forearm, initial encounter: Secondary | ICD-10-CM | POA: Diagnosis not present

## 2022-09-21 NOTE — ED Provider Notes (Signed)
Richard Franco CARE    CSN: [id number] Arrival date & time: 09/21/22  1401      History   Chief Complaint Chief Complaint  Patient presents with   Extremity Laceration    Left arm    HPI Richard Franco is a 57 y.o. male.   HPI  Patient was tearing out a wall and accidentally caught his lefr forearm on a nail.  The nail tore a jagged laceration on the dorsum of the left forearm  Tetanus up to date  Past Medical History:  Diagnosis Date   Abnormal chest CT 03/2012   a. 2013: Scattered patchy ground glass opacities and pulmonary nodules. Transbronchial biopsy with no definite etiology of lung finding- possible organizing pneumonia. b. F/u CT 05/2014: stable multiple tiny pulm nodules, no further w/u recommended per notes.    Anxiety    Arthritis    CAD (coronary artery disease)    CHF (congestive heart failure) (HCC)    Chronic midline posterior neck pain    Chronic pain disorder    Complication of anesthesia    " difficult to urinate after "   Depression    Elevated LFTs    Esophageal stricture    Fibromyalgia    Fracture    back   GERD (gastroesophageal reflux disease)    Hypercholesteremia    Hypertension    Iron deficiency    Migraine headache    "none lately; I've had alot in my life" (06/21/2015)   NSTEMI (non-ST elevated myocardial infarction) ([place]) 07/2012   a. Normal cath 03/2012. b. 07/2012: troponin 4, normal cors, ?vasospasm. Did not tolerate Imdur due to headache. On amlodipine.   Testosterone deficiency    Tobacco abuse     Patient Active Problem List   Diagnosis Date Noted   Intractable nausea and vomiting 09/16/2021   Hypokalemia 09/16/2021   Elevated LFTs 09/16/2021   Bicytopenia 09/16/2021   Post-operative complication [id number]   Occipital neuralgia 12/06/2020   Throat swelling 07/06/2016   Cocaine use 07/06/2016   ETOH abuse 07/06/2016   Dyspnea on exertion 07/04/2016   Chest pain 07/04/2016   Essential hypertension 07/04/2016    AKI (acute kidney injury) ([place]) 07/04/2016   Chest pain, atypical 11/16/2015   Pleuritic chest pain 06/22/2015   Tobacco abuse 06/22/2015   History of coronary vasospasm 06/22/2015   Smoking history 07/19/2014   Chronic fatigue 07/19/2014   Chronic arthralgias of knees and hips 11/26/2012   Hyperlipidemia 09/25/2012   GERD (gastroesophageal reflux disease) 08/11/2012   BPH (benign prostatic hyperplasia) 08/11/2012   Abnormal immunological finding in serum 05/03/2012   Pulmonary infiltrates 04/16/2012   Pulmonary nodule 03/25/2012   Abnormal CT lung screening 03/25/2012   History of tobacco abuse 02/18/2012   Chronic pain 02/18/2012   Hx of migraine headaches 02/18/2012   DYSPNEA 08/24/2009   CHEST PAIN-UNSPECIFIED 08/24/2009   ABFND, FALSE POSITIVE SEROLOGIC TEST, [facility] 01/12/2007   OSTEOARTHRITIS 01/05/2007    Past Surgical History:  Procedure Laterality Date   ANKLE FUSION Left 2006   BACK SURGERY     CERVICAL FUSION  X7 "last one 04/2011"   COLONOSCOPY     CYSTECTOMY     FRACTURE SURGERY     LEFT HEART CATHETERIZATION WITH CORONARY ANGIOGRAM N/A 03/25/2012   Procedure: LEFT HEART CATHETERIZATION WITH CORONARY ANGIOGRAM;  Surgeon: Burnell Blanks, MD;  Location: [facility] CATH LAB;  Service: Cardiovascular;  Laterality: N/A;   LEFT HEART CATHETERIZATION WITH CORONARY ANGIOGRAM N/A 08/11/2012  Procedure: LEFT HEART CATHETERIZATION WITH CORONARY ANGIOGRAM;  Surgeon: Peter M Martinique, MD;  Location: [facility] CATH LAB;  Service: Cardiovascular;  Laterality: N/A;   POSTERIOR CERVICAL FUSION/FORAMINOTOMY  05/09/2011   Procedure: POSTERIOR CERVICAL FUSION/FORAMINOTOMY LEVEL 1;  Surgeon: Hosie Spangle;  Location: [facility];  Service: Neurosurgery;  Laterality: N/A;  C4/5 posterior arthrodesis with instrumentation    POSTERIOR CERVICAL FUSION/FORAMINOTOMY N/A 12/06/2020   Procedure: Cervical two Ganglionectomy with extension of fusion from Cervical one to Cervical five;  Surgeon:  Judith Part, MD;  Location: [facility];  Service: Neurosurgery;  Laterality: N/A;   UPPER GASTROINTESTINAL ENDOSCOPY     VIDEO BRONCHOSCOPY  05/06/2012   Procedure: VIDEO BRONCHOSCOPY WITH FLUORO;  Surgeon: Jyl Heinz, MD;  Location: [facility] ENDOSCOPY;  Service: Cardiopulmonary;  Laterality: Bilateral;       Home Medications    Prior to Admission medications   Medication Sig Start Date End Date Taking? Authorizing Provider  aspirin 81 MG EC tablet Take 1 tablet (81 mg total) by mouth daily. Restart on 12/13/20 Patient taking differently: Take 81 mg by mouth daily. 12/08/20   Judith Part, MD  EPINEPHrine 0.3 mg/0.3 mL IJ SOAJ injection Inject 0.3 mg into the muscle once as needed for anaphylaxis. 07/14/19   [provider]  esomeprazole (NEXIUM) 40 MG capsule TAKE 1 CAPSULE (40 MG TOTAL) BY MOUTH 2 (TWO) TIMES DAILY BEFORE A MEAL. 05/27/22   Levin Erp, PA  FLUoxetine (PROZAC) 40 MG capsule Take 40 mg by mouth at bedtime.    [provider]  lamoTRIgine (LAMICTAL) 200 MG tablet Take 400 mg by mouth at bedtime.    [provider]  nitroGLYCERIN (NITROSTAT) 0.4 MG SL tablet Place 1 tablet (0.4 mg total) under the tongue every 5 (five) minutes as needed for chest pain. 01/16/22   Harriet Pho, PA-C  QUEtiapine (SEROQUEL) 50 MG tablet Take 50 mg by mouth at bedtime as needed (for sleep). 04/16/20   [provider]    Family History Family History  Problem Relation Age of Onset   Diabetes Mother    Cancer Mother        ?   Hypertension Mother    Hyperlipidemia Father    Diabetes Father    Coronary artery disease Paternal Uncle    Asthma Sister    Diabetes Brother    Hypertension Brother    Colon cancer Neg Hx    Colon polyps Neg Hx    Rectal cancer Neg Hx    Stomach cancer Neg Hx     Social History Social History   Tobacco Use   Smoking status: Former    Packs/day: 1.00    Years: 30.00    Additional pack years: 0.00     Total pack years: 30.00    Types: Cigarettes    Quit date: 12/16/2017    Years since quitting: 4.7   Smokeless tobacco: Former    Types: Nurse, children's Use: Never used  Substance Use Topics   Alcohol use: Not Currently    Alcohol/week: 21.0 standard drinks of alcohol    Types: 21 Cans of beer per week   Drug use: No     Allergies   Midodrine hcl, Other, Sulfa antibiotics, and Doxycycline   Review of Systems Review of Systems  See HPI Physical Exam Triage Vital Signs ED Triage Vitals  Enc Vitals Group     BP 09/21/22 1426 132/79  Pulse Rate 09/21/22 1426 97     Resp 09/21/22 1426 16     Temp 09/21/22 1426 98.3 F (36.8 C)     Temp Source 09/21/22 1426 Oral     SpO2 09/21/22 1426 98 %     Weight 09/21/22 1429 160 lb (72.6 kg)     Height 09/21/22 1429 5\' 8"  (1.727 m)     Head Circumference --      Peak Flow --      Pain Score 09/21/22 1428 3     Pain Loc --      Pain Edu? --      Excl. in [place]? --    No data found.  Updated Vital Signs BP 132/79 (BP Location: Right Arm)   Pulse 97   Temp 98.3 F (36.8 C) (Oral)   Resp 16   Ht 5\' 8"  (1.727 m)   Wt 72.6 kg   SpO2 98%   BMI 24.33 kg/m      Physical Exam Constitutional:      General: He is not in acute distress.    Appearance: He is well-developed.  HENT:     Head: Normocephalic and atraumatic.  Eyes:     Conjunctiva/sclera: Conjunctivae normal.     Pupils: Pupils are equal, round, and reactive to light.  Cardiovascular:     Rate and Rhythm: Normal rate.  Pulmonary:     Effort: Pulmonary effort is normal. No respiratory distress.  Abdominal:     General: There is no distension.     Palpations: Abdomen is soft.  Musculoskeletal:        General: Normal range of motion.     Cervical back: Normal range of motion.  Skin:    General: Skin is warm and dry.  Neurological:     Mental Status: He is alert.      UC Treatments / Results  Labs (all labs ordered are listed, but only  abnormal results are displayed) Labs Reviewed - No data to display  EKG   Radiology No results found.  Procedures Laceration Repair  Date/Time: 09/21/2022 3:15 PM  Performed by: Raylene Everts, MD Authorized by: Raylene Everts, MD   Consent:    Consent obtained:  Verbal   Consent given by:  Patient   Risks discussed:  Infection, pain and poor cosmetic result   Alternatives discussed:  No treatment Universal protocol:    Patient identity confirmed:  Verbally with patient and arm band Anesthesia:    Anesthesia method:  Local infiltration   Local anesthetic:  Lidocaine 2% WITH epi Laceration details:    Location:  Shoulder/arm   Shoulder/arm location:  L lower arm   Length (cm):  5   Depth (mm):  7 Pre-procedure details:    Preparation:  Patient was prepped and draped in usual sterile fashion Exploration:    Hemostasis achieved with:  Direct pressure   Wound exploration: wound explored through full range of motion     Wound extent: foreign bodies/material     Wound extent: no muscle damage noted, no nerve damage noted, no tendon damage noted and no underlying fracture noted     Contaminated: yes   Treatment:    Area cleansed with:  Chlorhexidine   Amount of cleaning:  Standard   Irrigation solution:  Sterile saline   Irrigation volume:  20   Irrigation method:  Syringe   Visualized foreign bodies/material removed: yes     Debridement:  Moderate   Undermining:  None   Scar revision: no   Skin repair:    Repair method:  Sutures   Suture size:  7-0 and 4-0   Suture material:  Nylon   Suture technique:  Simple interrupted   Number of sutures:  7 Approximation:    Approximation:  Close Post-procedure details:    Dressing:  Antibiotic ointment and bulky dressing   Procedure completion:  Tolerated well, no immediate complications  (including critical care time)  Medications Ordered in UC Medications - No data to display  Initial Impression / Assessment  and Plan / UC Course  I have reviewed the triage vital signs and the nursing notes.  Pertinent labs & imaging results that were available during my care of the patient were reviewed by me and considered in my medical decision making (see chart for details).     Patient reports satisfaction with repair.  Signs of infection reviewed.  Return If needed Final Clinical Impressions(s) / UC Diagnoses   Final diagnoses:  Laceration of left forearm, initial encounter     Discharge Instructions      Keep clean and dry May shower then pat dry Call or return for problems or infection stitches can come out in 7 days   ED Prescriptions   None    PDMP not reviewed this encounter.   Raylene Everts, MD 09/21/22 [phone]

## 2022-09-21 NOTE — Discharge Instructions (Addendum)
Keep clean and dry May shower then pat dry Call or return for problems or infection stitches can come out in 7 days

## 2022-09-21 NOTE — ED Triage Notes (Signed)
Pt tore his left forearm open on a nail 2.5 hours pta  Not actively bleeding  Has not been cleaned  Py thinks his last tdap was 4-6 years ago

## 2022-09-22 ENCOUNTER — Telehealth: Payer: Self-pay | Admitting: Emergency Medicine

## 2022-09-22 NOTE — Telephone Encounter (Signed)
Call to see how Richard Franco was today  and to see if he had any questions or concerns- no answer

## 2022-10-14 ENCOUNTER — Other Ambulatory Visit: Payer: Self-pay | Admitting: Neurological Surgery

## 2022-11-01 ENCOUNTER — Ambulatory Visit: Payer: Medicare Other | Admitting: Gastroenterology

## 2022-11-20 NOTE — Pre-Procedure Instructions (Signed)
Surgical Instructions    Your procedure is scheduled on Thursday 11/28/22.   Report to [facility] Main Entrance "A" at 05:30 A.M., then check in with the Admitting office.  Call this number if you have problems the morning of surgery:  [phone]   If you have any questions prior to your surgery date call [phone]: Open Monday-Friday 8am-4pm If you experience any cold or flu symptoms such as cough, fever, chills, shortness of breath, etc. between now and your scheduled surgery, please notify us at the above number     Remember:  Do not eat after midnight the night before your surgery  You may drink clear liquids until 04:30 A.M. the morning of your surgery.   Clear liquids allowed are: Water, Non-Citrus Juices (without pulp), Carbonated Beverages, Clear Tea, Black Coffee ONLY (NO MILK, CREAM OR POWDERED CREAMER of any kind), and Gatorade    Take these medicines the morning of surgery with A SIP OF WATER:   esomeprazole (NEXIUM)    nitroGLYCERIN (NITROSTAT)- If needed   As of today, STOP taking any Aspirin (unless otherwise instructed by your surgeon) Aleve, Naproxen, Ibuprofen, Motrin, Advil, Goody's, BC's, all herbal medications, fish oil, and all vitamins.           Do not wear jewelry or makeup. Do not wear lotions, powders, perfumes/cologne or deodorant. Do not shave 48 hours prior to surgery.  Men may shave face and neck. Do not bring valuables to the hospital. Do not wear nail polish, gel polish, artificial nails, or any other type of covering on natural nails (fingers and toes) If you have artificial nails or gel coating that need to be removed by a nail salon, please have this removed prior to surgery. Artificial nails or gel coating may interfere with anesthesia's ability to adequately monitor your vital signs.  [facility] is not responsible for any belongings or valuables.    Do NOT Smoke (Tobacco/Vaping)  24 hours prior to your procedure  If you use a CPAP at  night, you may bring your mask for your overnight stay.   Contacts, glasses, hearing aids, dentures or partials may not be worn into surgery, please bring cases for these belongings   For patients admitted to the hospital, discharge time will be determined by your treatment team.   Patients discharged the day of surgery will not be allowed to drive home, and someone needs to stay with them for 24 hours.   SURGICAL WAITING ROOM VISITATION Patients having surgery or a procedure may have no more than 2 support people in the waiting area - these visitors may rotate.   Children under the age of 69 must have an adult with them who is not the patient. If the patient needs to stay at the hospital during part of their recovery, the visitor guidelines for inpatient rooms apply. Pre-op nurse will coordinate an appropriate time for 1 support person to accompany patient in pre-op.  This support person may not rotate.   Please refer to [email or web address] for the visitor guidelines for Inpatients (after your surgery is over and you are in a regular room).    Special instructions:    Oral Hygiene is also important to reduce your risk of infection.  Remember - BRUSH YOUR TEETH THE MORNING OF SURGERY WITH YOUR REGULAR TOOTHPASTE     Pre-operative 5 CHG Bath Instructions   You can play a key role in reducing the risk of infection after surgery. Your skin needs to be as  free of germs as possible. You can reduce the number of germs on your skin by washing with CHG (chlorhexidine gluconate) soap before surgery. CHG is an antiseptic soap that kills germs and continues to kill germs even after washing.   DO NOT use if you have an allergy to chlorhexidine/CHG or antibacterial soaps. If your skin becomes reddened or irritated, stop using the CHG and notify one of our RNs at [phone].   Please shower with the CHG soap starting 4 days before surgery using the  following schedule:     Please keep in mind the following:  DO NOT shave, including legs and underarms, starting the day of your first shower.   You may shave your face at any point before/day of surgery.  Place clean sheets on your bed the day you start using CHG soap. Use a clean washcloth (not used since being washed) for each shower. DO NOT sleep with pets once you start using the CHG.   CHG Shower Instructions:  If you choose to wash your hair and private area, wash first with your normal shampoo/soap.  After you use shampoo/soap, rinse your hair and body thoroughly to remove shampoo/soap residue.  Turn the water OFF and apply about 3 tablespoons (45 ml) of CHG soap to a CLEAN washcloth.  Apply CHG soap ONLY FROM YOUR NECK DOWN TO YOUR TOES (washing for 3-5 minutes)  DO NOT use CHG soap on face, private areas, open wounds, or sores.  Pay special attention to the area where your surgery is being performed.  If you are having back surgery, having someone wash your back for you may be helpful. Wait 2 minutes after CHG soap is applied, then you may rinse off the CHG soap.  Pat dry with a clean towel  Put on clean clothes/pajamas   If you choose to wear lotion, please use ONLY the CHG-compatible lotions on the back of this paper.     Additional instructions for the day of surgery: DO NOT APPLY any lotions, deodorants, cologne, or perfumes.   Put on clean/comfortable clothes.  Brush your teeth.  Ask your nurse before applying any prescription medications to the skin.      CHG Compatible Lotions   Aveeno Moisturizing lotion  Cetaphil Moisturizing Cream  Cetaphil Moisturizing Lotion  Clairol Herbal Essence Moisturizing Lotion, Dry Skin  Clairol Herbal Essence Moisturizing Lotion, Extra Dry Skin  Clairol Herbal Essence Moisturizing Lotion, Normal Skin  Curel Age Defying Therapeutic Moisturizing Lotion with Alpha Hydroxy  Curel Extreme Care Body Lotion  Curel Soothing Hands  Moisturizing Hand Lotion  Curel Therapeutic Moisturizing Cream, Fragrance-Free  Curel Therapeutic Moisturizing Lotion, Fragrance-Free  Curel Therapeutic Moisturizing Lotion, Original Formula  Eucerin Daily Replenishing Lotion  Eucerin Dry Skin Therapy Plus Alpha Hydroxy Crme  Eucerin Dry Skin Therapy Plus Alpha Hydroxy Lotion  Eucerin Original Crme  Eucerin Original Lotion  Eucerin Plus Crme Eucerin Plus Lotion  Eucerin TriLipid Replenishing Lotion  Keri Anti-Bacterial Hand Lotion  Keri Deep Conditioning Original Lotion Dry Skin Formula Softly Scented  Keri Deep Conditioning Original Lotion, Fragrance Free Sensitive Skin Formula  Keri Lotion Fast Absorbing Fragrance Free Sensitive Skin Formula  Keri Lotion Fast Absorbing Softly Scented Dry Skin Formula  Keri Original Lotion  Keri Skin Renewal Lotion Keri Silky Smooth Lotion  Keri Silky Smooth Sensitive Skin Lotion  Nivea Body Creamy Conditioning Oil  Nivea Body Extra Enriched Teacher, adult education Moisturizing Lotion Nivea Crme  Nivea Skin  Firming Lotion  NutraDerm 30 Skin Lotion  NutraDerm Skin Lotion  NutraDerm Therapeutic Skin Cream  NutraDerm Therapeutic Skin Lotion  ProShield Protective Hand Cream  Provon moisturizing lotion   Day of Surgery:  Take a shower with CHG soap. Wear Clean/Comfortable clothing the morning of surgery Do not apply any deodorants/lotions.   Remember to brush your teeth WITH YOUR REGULAR TOOTHPASTE.    If you received a COVID test during your pre-op visit, it is requested that you wear a mask when out in public, stay away from anyone that may not be feeling well, and notify your surgeon if you develop symptoms. If you have been in contact with anyone that has tested positive in the last 10 days, please notify your surgeon.    Please read over the following fact sheets that you were given.

## 2022-11-21 ENCOUNTER — Inpatient Hospital Stay (HOSPITAL_COMMUNITY)
Admission: RE | Admit: 2022-11-21 | Discharge: 2022-11-21 | Disposition: A | Discharge status: Home / Self Care | Payer: Medicare Other | Source: Ambulatory Visit

## 2022-11-21 NOTE — Progress Notes (Signed)
Call patient at [phone] for 0800  PAT appointment. States he thought it was scheduled for Friday and that he cannot attend today.  Dondra Spry, scheduler made aware.

## 2022-11-22 ENCOUNTER — Other Ambulatory Visit (HOSPITAL_COMMUNITY): Payer: Medicare Other

## 2022-11-26 ENCOUNTER — Inpatient Hospital Stay (HOSPITAL_COMMUNITY)
Admission: RE | Admit: 2022-11-26 | Discharge: 2022-11-26 | Disposition: A | Discharge status: Home / Self Care | Payer: Medicare Other | Source: Ambulatory Visit

## 2022-11-26 NOTE — Progress Notes (Signed)
Called pt to see if he was going to make it to appt at 0800 today. He said he thought it was ay 1500. Pt was offered a 1000 appt but said he was unable to make that. Dondra Spry, scheduler, made aware.

## 2022-11-26 NOTE — Progress Notes (Signed)
     Your surgery and Pre-Admission testing visit will be at [facility] located at [address], [place], [place] [address].  Please let all your doctors (i.e., Primary Care Physician, Cardiologist, Endocrinologist, Pulmonologist) know you are having surgery. You may need clearance for surgery. If you are on blood thinners, notify your surgeon and ask the doctor who prescribed them how long to hold them before surgery.  If you have had a heart test, such as an EKG, stress test, heart ultrasound, etc., or lab work performed outside of [facility], please bring copies of these tests to your Pre-Admission testing, if possible.  These departments may contact you before the day of surgery:  Pre-Service Center - insurance/ billing: [phone] Pharmacy- to review your medications: [phone] Pre-Admission Testing- to set an appointment for your visit: [phone]  (Often, these numbers show up as "SPAM" on your phone)  The Pre-Admission Testing (PAT) visit focuses on Anesthesia for your upcoming surgery.  You do NOT need to fast; take your medications as usual. Please arrive 30 minutes early to allow for parking and admitting.  The visit may last up to an hour. Bring a photo ID and medical insurance card. Reschedule if you are sick. ([phone]) and please, NO children under age 16 at the visit.  During the PAT visit:  We will review your medical and surgical history.   You will receive pre-operative instructions, including the time of arrival at the hospital and surgical start time.  We will review what medication(s) you can take on the day of surgery.  After speaking with the nurse, you will have blood drawn and, if needed, a chest x-ray and EKG.  Most lab results from your doctor are good for 30 days, Hemoglobin A1C is good for 60 days. If you cannot talk to the Pharmacy, bring your medications or a list of them to the PST visit.   Infection control for the [facility] requires: All fingernail and toenail products should be removed before the day of surgery.  (SNS, Acrylic, Gel, Polish, Stickers, Press on, and Poly gel nails.)   Parking information:  Address: [facility] - [address], [place],  [address]  Please look for signs for entrance A off of [address]. Free valet parking is available Monday-Friday 05:30am-06:00pm     

## 2022-11-29 NOTE — Pre-Procedure Instructions (Signed)
Surgical Instructions    Your procedure is scheduled on Friday 12/06/22.   Report to [facility] Main Entrance "A" at 05:30 A.M., then check in with the Admitting office.  Call this number if you have problems the morning of surgery:  [phone]   If you have any questions prior to your surgery date call [phone]: Open Monday-Friday 8am-4pm If you experience any cold or flu symptoms such as cough, fever, chills, shortness of breath, etc. between now and your scheduled surgery, please notify us at the above number     Remember:  Do not eat after midnight the night before your surgery  You may drink clear liquids until 04:30 A.M. the morning of your surgery.   Clear liquids allowed are: Water, Non-Citrus Juices (without pulp), Carbonated Beverages, Clear Tea, Black Coffee ONLY (NO MILK, CREAM OR POWDERED CREAMER of any kind), and Gatorade    Take these medicines the morning of surgery with A SIP OF WATER:   esomeprazole (NEXIUM)    nitroGLYCERIN (NITROSTAT)- If needed   As of today, STOP taking any Aspirin (unless otherwise instructed by your surgeon) Aleve, Naproxen, Ibuprofen, Motrin, Advil, Goody's, BC's, all herbal medications, fish oil, and all vitamins.           Do not wear jewelry or makeup. Do not wear lotions, powders, perfumes/cologne or deodorant. Do not shave 48 hours prior to surgery.  Men may shave face and neck. Do not bring valuables to the hospital. Do not wear nail polish, gel polish, artificial nails, or any other type of covering on natural nails (fingers and toes) If you have artificial nails or gel coating that need to be removed by a nail salon, please have this removed prior to surgery. Artificial nails or gel coating may interfere with anesthesia's ability to adequately monitor your vital signs.  [facility] is not responsible for any belongings or valuables.    Do NOT Smoke (Tobacco/Vaping)  24 hours prior to your procedure  If you use a CPAP at  night, you may bring your mask for your overnight stay.   Contacts, glasses, hearing aids, dentures or partials may not be worn into surgery, please bring cases for these belongings   For patients admitted to the hospital, discharge time will be determined by your treatment team.   Patients discharged the day of surgery will not be allowed to drive home, and someone needs to stay with them for 24 hours.   SURGICAL WAITING ROOM VISITATION Patients having surgery or a procedure may have no more than 2 support people in the waiting area - these visitors may rotate.   Children under the age of 74 must have an adult with them who is not the patient. If the patient needs to stay at the hospital during part of their recovery, the visitor guidelines for inpatient rooms apply. Pre-op nurse will coordinate an appropriate time for 1 support person to accompany patient in pre-op.  This support person may not rotate.   Please refer to [email or web address] for the visitor guidelines for Inpatients (after your surgery is over and you are in a regular room).    Special instructions:    Oral Hygiene is also important to reduce your risk of infection.  Remember - BRUSH YOUR TEETH THE MORNING OF SURGERY WITH YOUR REGULAR TOOTHPASTE     Pre-operative 5 CHG Bath Instructions   You can play a key role in reducing the risk of infection after surgery. Your skin needs to be as  free of germs as possible. You can reduce the number of germs on your skin by washing with CHG (chlorhexidine gluconate) soap before surgery. CHG is an antiseptic soap that kills germs and continues to kill germs even after washing.   DO NOT use if you have an allergy to chlorhexidine/CHG or antibacterial soaps. If your skin becomes reddened or irritated, stop using the CHG and notify one of our RNs at [phone].   Please shower with the CHG soap starting 4 days before surgery using the  following schedule:     Please keep in mind the following:  DO NOT shave, including legs and underarms, starting the day of your first shower.   You may shave your face at any point before/day of surgery.  Place clean sheets on your bed the day you start using CHG soap. Use a clean washcloth (not used since being washed) for each shower. DO NOT sleep with pets once you start using the CHG.   CHG Shower Instructions:  If you choose to wash your hair and private area, wash first with your normal shampoo/soap.  After you use shampoo/soap, rinse your hair and body thoroughly to remove shampoo/soap residue.  Turn the water OFF and apply about 3 tablespoons (45 ml) of CHG soap to a CLEAN washcloth.  Apply CHG soap ONLY FROM YOUR NECK DOWN TO YOUR TOES (washing for 3-5 minutes)  DO NOT use CHG soap on face, private areas, open wounds, or sores.  Pay special attention to the area where your surgery is being performed.  If you are having back surgery, having someone wash your back for you may be helpful. Wait 2 minutes after CHG soap is applied, then you may rinse off the CHG soap.  Pat dry with a clean towel  Put on clean clothes/pajamas   If you choose to wear lotion, please use ONLY the CHG-compatible lotions on the back of this paper.     Additional instructions for the day of surgery: DO NOT APPLY any lotions, deodorants, cologne, or perfumes.   Put on clean/comfortable clothes.  Brush your teeth.  Ask your nurse before applying any prescription medications to the skin.      CHG Compatible Lotions   Aveeno Moisturizing lotion  Cetaphil Moisturizing Cream  Cetaphil Moisturizing Lotion  Clairol Herbal Essence Moisturizing Lotion, Dry Skin  Clairol Herbal Essence Moisturizing Lotion, Extra Dry Skin  Clairol Herbal Essence Moisturizing Lotion, Normal Skin  Curel Age Defying Therapeutic Moisturizing Lotion with Alpha Hydroxy  Curel Extreme Care Body Lotion  Curel Soothing Hands  Moisturizing Hand Lotion  Curel Therapeutic Moisturizing Cream, Fragrance-Free  Curel Therapeutic Moisturizing Lotion, Fragrance-Free  Curel Therapeutic Moisturizing Lotion, Original Formula  Eucerin Daily Replenishing Lotion  Eucerin Dry Skin Therapy Plus Alpha Hydroxy Crme  Eucerin Dry Skin Therapy Plus Alpha Hydroxy Lotion  Eucerin Original Crme  Eucerin Original Lotion  Eucerin Plus Crme Eucerin Plus Lotion  Eucerin TriLipid Replenishing Lotion  Keri Anti-Bacterial Hand Lotion  Keri Deep Conditioning Original Lotion Dry Skin Formula Softly Scented  Keri Deep Conditioning Original Lotion, Fragrance Free Sensitive Skin Formula  Keri Lotion Fast Absorbing Fragrance Free Sensitive Skin Formula  Keri Lotion Fast Absorbing Softly Scented Dry Skin Formula  Keri Original Lotion  Keri Skin Renewal Lotion Keri Silky Smooth Lotion  Keri Silky Smooth Sensitive Skin Lotion  Nivea Body Creamy Conditioning Oil  Nivea Body Extra Enriched Teacher, adult education Moisturizing Lotion Nivea Crme  Nivea Skin  Firming Lotion  NutraDerm 30 Skin Lotion  NutraDerm Skin Lotion  NutraDerm Therapeutic Skin Cream  NutraDerm Therapeutic Skin Lotion  ProShield Protective Hand Cream  Provon moisturizing lotion   Day of Surgery:  Take a shower with CHG soap. Wear Clean/Comfortable clothing the morning of surgery Do not apply any deodorants/lotions.   Remember to brush your teeth WITH YOUR REGULAR TOOTHPASTE.    If you received a COVID test during your pre-op visit, it is requested that you wear a mask when out in public, stay away from anyone that may not be feeling well, and notify your surgeon if you develop symptoms. If you have been in contact with anyone that has tested positive in the last 10 days, please notify your surgeon.    Please read over the following fact sheets that you were given.

## 2022-12-02 ENCOUNTER — Other Ambulatory Visit: Payer: Self-pay

## 2022-12-02 ENCOUNTER — Encounter (HOSPITAL_COMMUNITY): Payer: Self-pay

## 2022-12-02 ENCOUNTER — Encounter (HOSPITAL_COMMUNITY)
Admission: RE | Admit: 2022-12-02 | Discharge: 2022-12-02 | Disposition: A | Discharge status: Home / Self Care | Payer: Medicare Other | Source: Ambulatory Visit | Attending: Neurological Surgery | Admitting: Neurological Surgery

## 2022-12-02 VITALS — BP 113/81 | HR 82 | Temp 98.2°F | Resp 18 | Ht 68.0 in | Wt 168.8 lb

## 2022-12-02 DIAGNOSIS — R7989 Other specified abnormal findings of blood chemistry: Secondary | ICD-10-CM

## 2022-12-02 DIAGNOSIS — E7800 Pure hypercholesterolemia, unspecified: Secondary | ICD-10-CM | POA: Diagnosis not present

## 2022-12-02 DIAGNOSIS — I10 Essential (primary) hypertension: Secondary | ICD-10-CM | POA: Diagnosis not present

## 2022-12-02 DIAGNOSIS — Z87891 Personal history of nicotine dependence: Secondary | ICD-10-CM | POA: Insufficient documentation

## 2022-12-02 DIAGNOSIS — I252 Old myocardial infarction: Secondary | ICD-10-CM | POA: Insufficient documentation

## 2022-12-02 DIAGNOSIS — Z01812 Encounter for preprocedural laboratory examination: Secondary | ICD-10-CM | POA: Diagnosis present

## 2022-12-02 DIAGNOSIS — M797 Fibromyalgia: Secondary | ICD-10-CM | POA: Diagnosis not present

## 2022-12-02 DIAGNOSIS — Z01818 Encounter for other preprocedural examination: Secondary | ICD-10-CM

## 2022-12-02 HISTORY — DX: Other nonspecific abnormal finding of lung field: R91.8

## 2022-12-02 HISTORY — DX: Personal history of urinary calculi: Z87.442

## 2022-12-02 LAB — CBC
HCT: 41.5 % (ref 39.0–52.0)
Hemoglobin: 13.8 g/dL (ref 13.0–17.0)
MCH: 29.9 pg (ref 26.0–34.0)
MCHC: 33.3 g/dL (ref 30.0–36.0)
MCV: 89.8 fL (ref 80.0–100.0)
Platelets: 216 10*3/uL (ref 150–400)
RBC: 4.62 MIL/uL (ref 4.22–5.81)
RDW: 12.8 % (ref 11.5–15.5)
WBC: 4.5 10*3/uL (ref 4.0–10.5)
nRBC: 0 % (ref 0.0–0.2)

## 2022-12-02 LAB — COMPREHENSIVE METABOLIC PANEL
ALT: 33 U/L (ref 0–44)
AST: 28 U/L (ref 15–41)
Albumin: 4.3 g/dL (ref 3.5–5.0)
Alkaline Phosphatase: 73 U/L (ref 38–126)
Anion gap: 9 (ref 5–15)
BUN: 9 mg/dL (ref 6–20)
CO2: 28 mmol/L (ref 22–32)
Calcium: 9.5 mg/dL (ref 8.9–10.3)
Chloride: 100 mmol/L (ref 98–111)
Creatinine, Ser: 1.25 mg/dL — ABNORMAL HIGH (ref 0.61–1.24)
GFR, Estimated: >60 mL/min (ref >60)
Glucose, Bld: 98 mg/dL (ref 70–99)
Potassium: 4.5 mmol/L (ref 3.5–5.1)
Sodium: 137 mmol/L (ref 135–145)
Total Bilirubin: 0.8 mg/dL (ref 0.3–1.2)
Total Protein: 7.3 g/dL (ref 6.5–8.1)

## 2022-12-02 LAB — SURGICAL PCR SCREEN
MRSA, PCR: NEGATIVE
Staphylococcus aureus: POSITIVE — AB

## 2022-12-02 LAB — BPAM RBC

## 2022-12-02 NOTE — Progress Notes (Signed)
PCP - Kathlen Brunswick, NP Cardiologist - Dr. Jodelle Red Pulmonologist- Dr. Kalman Shan    PPM/ICD - denies   Chest x-ray - 01/16/22 EKG - 01/16/22 Stress Test - 11/29/20 ECHO - 07/05/16 Cardiac Cath - 08/11/12  Sleep Study - denies   DM- denies  Blood Thinner Instructions: Aspirin Instructions: Hold 3 days per pt. Last dose 6/8  ERAS Protcol - yes, no drink   COVID TEST- n/a   Anesthesia review: yes, cardiac hx and pt sees pulmonologist  Patient denies shortness of breath, fever, cough and chest pain at PAT appointment   All instructions explained to the patient, with a verbal understanding of the material. Patient agrees to go over the instructions while at home for a better understanding.  The opportunity to ask questions was provided.

## 2022-12-03 NOTE — Progress Notes (Signed)
Anesthesia Chart Review:  History includes former smoker (quit 12/16/17), chronic chest pain, NSTEMI 07/2012 (due to vasospasm, coronaries normal), HTN, hypercholesterolemia, fibromyalgia, pulmonary nodules (negative lingula biopsy 05/06/12; stable on 09/08/18 CT and felt to benign), GERD, esophageal stricture, s/p C3-7 anterior as well as C1/C5 posterior fusion,  post-operative urinary retention.    Patient was last seen by cardiologist Dr. Flora Lipps 11/15/2020 for follow-up of chronic noncardiac chest pain.  He was noted to have no coronary calcium on prior chest CT as well as prior LHC in 2014 showing normal coronaries.  Per note, "Overall I suspect he is having noncardiac chest pain. We will obtain an exercise nuclear stress test just to be sure. He would be a great candidate for cardiac CTA however there is a contrast shortage. We will get a good idea of functional capacity on the treadmill as well."  Stress test 11/29/2020 was low risk and he was advised to follow-up with cardiology on an as-needed basis.  Pulmonary function test in July 2019 showed normal lung function with no airflow obstruction or restriction.  He was previously prescribed as needed albuterol for suspected mild intermittent asthma.  Preop labs reviewed, creatinine mildly elevated 1.25, otherwise unremarkable.  EKG 01/16/2022: NSR.  Rate 71.  CHEST  1 VIEW 01/16/2022: COMPARISON:  Radiographs 09/16/2021   FINDINGS: The heart size and mediastinal contours are within normal limits. Both lungs are clear. The visualized skeletal structures are unremarkable. Cervical spine fusion hardware.   IMPRESSION: No active disease.  Nuclear stress test 11/29/20: Nuclear stress EF: 57%. The left ventricular ejection fraction is normal (55-65%). There was no ST segment deviation noted during stress. No T wave inversion was noted during stress. There is a medium fixed defect in basal-to-apical inferior LV wall that is likely due to artifact due  to significant extracardiac tracer uptake vs less likely infarct. Wall motion is normal. This is a low risk study.   Echo 07/05/16: Study Conclusions  - Left ventricle: Inferobasal hypokinesis. The cavity size was    normal. Systolic function was normal. The estimated ejection    fraction was in the range of 55% to 60%. Wall motion was normal;    there were no regional wall motion abnormalities. Left    ventricular diastolic function parameters were normal.  - Atrial septum: A patent foramen ovale cannot be excluded.    Cardiac cath 08/11/12 (in setting of NSTEMI) Final Conclusions:   1. Normal coronary anatomy. 2. Normal LV function.  Recommendations: treat empirically for spasm.    Zannie Cove [facility] Short Stay Center/Anesthesiology Phone [phone] 12/03/2022 12:37 PM

## 2022-12-03 NOTE — Anesthesia Preprocedure Evaluation (Addendum)
Anesthesia Evaluation  Patient identified by MRN, date of birth, ID band Patient awake    Reviewed: Allergy & Precautions, NPO status , Patient's Chart, lab work & pertinent test results  Airway Mallampati: II  TM Distance: >3 FB Neck ROM: Limited    Dental   Pulmonary former smoker   breath sounds clear to auscultation       Cardiovascular hypertension, Pt. on medications + CAD and + Past MI   Rhythm:Regular Rate:Normal     Neuro/Psych  Headaches  Neuromuscular disease    GI/Hepatic Neg liver ROS,GERD  ,,  Endo/Other  negative endocrine ROS    Renal/GU Renal InsufficiencyRenal disease     Musculoskeletal  (+) Arthritis ,  Fibromyalgia -  Abdominal   Peds  Hematology negative hematology ROS (+)   Anesthesia Other Findings   Reproductive/Obstetrics                             Anesthesia Physical Anesthesia Plan  ASA: 2  Anesthesia Plan: General   Post-op Pain Management: Tylenol PO (pre-op)* and Ketamine IV*   Induction: Intravenous  PONV Risk Score and Plan: 2 and Dexamethasone, Ondansetron and Treatment may vary due to age or medical condition  Airway Management Planned: Oral ETT and Video Laryngoscope Planned  Additional Equipment:   Intra-op Plan:   Post-operative Plan: Extubation in OR  Informed Consent: I have reviewed the patients History and Physical, chart, labs and discussed the procedure including the risks, benefits and alternatives for the proposed anesthesia with the patient or authorized representative who has indicated his/her understanding and acceptance.     Dental advisory given  Plan Discussed with: CRNA  Anesthesia Plan Comments: (PAT note by Antionette Poles, PA-C:  History includes former smoker (quit 12/16/17), chronic chest pain, NSTEMI 07/2012 (due to vasospasm, coronaries normal), HTN, hypercholesterolemia, fibromyalgia, pulmonary nodules (negative  lingula biopsy 05/06/12; stable on 09/08/18 CT and felt to benign), GERD, esophageal stricture, s/p C3-7 anterior as well as C1/C5 posterior fusion,  post-operative urinary retention.   Patient was last seen by cardiologist Dr. Flora Lipps 11/15/2020 for follow-up of chronic noncardiac chest pain.  He was noted to have no coronary calcium on prior chest CT as well as prior LHC in 2014 showing normal coronaries.  Per note, "Overall I suspect he is having noncardiac chest pain. We will obtain an exercise nuclear stress test just to be sure. He would be a great candidate for cardiac CTA however there is a contrast shortage. We will get a good idea of functional capacity on the treadmill as well."  Stress test 11/29/2020 was low risk and he was advised to follow-up with cardiology on an as-needed basis.  Pulmonary function test in July 2019 showed normal lung function with no airflow obstruction or restriction.  He was previously prescribed as needed albuterol for suspected mild intermittent asthma.  Preop labs reviewed, creatinine mildly elevated 1.25, otherwise unremarkable.  EKG 01/16/2022: NSR.  Rate 71.  CHEST  1 VIEW 01/16/2022: COMPARISON:  Radiographs 09/16/2021  FINDINGS: The heart size and mediastinal contours are within normal limits. Both lungs are clear. The visualized skeletal structures are unremarkable. Cervical spine fusion hardware.  IMPRESSION: No active disease.  Nuclear stress test 11/29/20: ? Nuclear stress EF: 57%. ? The left ventricular ejection fraction is normal (55-65%). ? There was no ST segment deviation noted during stress. ? No T wave inversion was noted during stress. ? There is a medium fixed  defect in basal-to-apical inferior LV wall that is likely due to artifact due to significant extracardiac tracer uptake vs less likely infarct. Wall motion is normal. ? This is a low risk study.  Echo 07/05/16: Study Conclusions  - Left ventricle: Inferobasal hypokinesis. The  cavity size was  normal. Systolic function was normal. The estimated ejection  fraction was in the range of 55% to 60%. Wall motion was normal;  there were no regional wall motion abnormalities. Left  ventricular diastolic function parameters were normal.  - Atrial septum: A patent foramen ovale cannot be excluded.   Cardiac cath 08/11/12 (in setting of NSTEMI) Final Conclusions:  1. Normal coronary anatomy. 2. Normal LV function. Recommendations:treat empirically for spasm.  )        Anesthesia Quick Evaluation

## 2022-12-06 ENCOUNTER — Inpatient Hospital Stay (HOSPITAL_COMMUNITY): Payer: Medicare Other

## 2022-12-06 ENCOUNTER — Other Ambulatory Visit: Payer: Self-pay

## 2022-12-06 ENCOUNTER — Inpatient Hospital Stay (HOSPITAL_COMMUNITY): Payer: Medicare Other | Admitting: Physician Assistant

## 2022-12-06 ENCOUNTER — Encounter (HOSPITAL_COMMUNITY)
Admission: RE | Disposition: A | Discharge status: Home / Self Care | Payer: Self-pay | Source: Home / Self Care | Attending: Neurological Surgery

## 2022-12-06 ENCOUNTER — Inpatient Hospital Stay (HOSPITAL_COMMUNITY)
Admission: RE | Admit: 2022-12-06 | Discharge: 2022-12-07 | DRG: 472 | Disposition: A | Discharge status: Home / Self Care | Payer: Medicare Other | Attending: Neurosurgery | Admitting: Neurosurgery

## 2022-12-06 ENCOUNTER — Encounter (HOSPITAL_COMMUNITY): Payer: Self-pay | Admitting: Neurological Surgery

## 2022-12-06 ENCOUNTER — Inpatient Hospital Stay (HOSPITAL_COMMUNITY): Payer: Medicare Other | Admitting: Registered Nurse

## 2022-12-06 DIAGNOSIS — M960 Pseudarthrosis after fusion or arthrodesis: Secondary | ICD-10-CM | POA: Diagnosis present

## 2022-12-06 DIAGNOSIS — K219 Gastro-esophageal reflux disease without esophagitis: Secondary | ICD-10-CM | POA: Diagnosis present

## 2022-12-06 DIAGNOSIS — Y792 Prosthetic and other implants, materials and accessory orthopedic devices associated with adverse incidents: Secondary | ICD-10-CM | POA: Diagnosis present

## 2022-12-06 DIAGNOSIS — E7800 Pure hypercholesterolemia, unspecified: Secondary | ICD-10-CM | POA: Diagnosis present

## 2022-12-06 DIAGNOSIS — Z825 Family history of asthma and other chronic lower respiratory diseases: Secondary | ICD-10-CM | POA: Diagnosis not present

## 2022-12-06 DIAGNOSIS — Z8249 Family history of ischemic heart disease and other diseases of the circulatory system: Secondary | ICD-10-CM | POA: Diagnosis not present

## 2022-12-06 DIAGNOSIS — F419 Anxiety disorder, unspecified: Secondary | ICD-10-CM | POA: Diagnosis present

## 2022-12-06 DIAGNOSIS — S129XXA Fracture of neck, unspecified, initial encounter: Principal | ICD-10-CM | POA: Diagnosis present

## 2022-12-06 DIAGNOSIS — Z87891 Personal history of nicotine dependence: Secondary | ICD-10-CM

## 2022-12-06 DIAGNOSIS — F32A Depression, unspecified: Secondary | ICD-10-CM | POA: Diagnosis present

## 2022-12-06 DIAGNOSIS — T84226A Displacement of internal fixation device of vertebrae, initial encounter: Secondary | ICD-10-CM | POA: Diagnosis present

## 2022-12-06 DIAGNOSIS — Z83438 Family history of other disorder of lipoprotein metabolism and other lipidemia: Secondary | ICD-10-CM

## 2022-12-06 DIAGNOSIS — I252 Old myocardial infarction: Secondary | ICD-10-CM

## 2022-12-06 DIAGNOSIS — Y838 Other surgical procedures as the cause of abnormal reaction of the patient, or of later complication, without mention of misadventure at the time of the procedure: Secondary | ICD-10-CM | POA: Diagnosis present

## 2022-12-06 DIAGNOSIS — Z881 Allergy status to other antibiotic agents status: Secondary | ICD-10-CM

## 2022-12-06 DIAGNOSIS — Z882 Allergy status to sulfonamides status: Secondary | ICD-10-CM

## 2022-12-06 DIAGNOSIS — Z888 Allergy status to other drugs, medicaments and biological substances status: Secondary | ICD-10-CM

## 2022-12-06 DIAGNOSIS — I2510 Atherosclerotic heart disease of native coronary artery without angina pectoris: Secondary | ICD-10-CM

## 2022-12-06 DIAGNOSIS — G8929 Other chronic pain: Secondary | ICD-10-CM | POA: Diagnosis present

## 2022-12-06 DIAGNOSIS — Z87442 Personal history of urinary calculi: Secondary | ICD-10-CM | POA: Diagnosis not present

## 2022-12-06 DIAGNOSIS — Z833 Family history of diabetes mellitus: Secondary | ICD-10-CM

## 2022-12-06 DIAGNOSIS — I10 Essential (primary) hypertension: Secondary | ICD-10-CM

## 2022-12-06 DIAGNOSIS — M797 Fibromyalgia: Secondary | ICD-10-CM | POA: Diagnosis present

## 2022-12-06 HISTORY — PX: POSTERIOR CERVICAL FUSION/FORAMINOTOMY: SHX5038

## 2022-12-06 LAB — TYPE AND SCREEN
ABO/RH(D): O POS
Antibody Screen: POSITIVE

## 2022-12-06 SURGERY — POSTERIOR CERVICAL FUSION/FORAMINOTOMY LEVEL 1
Anesthesia: General | Site: Spine Cervical

## 2022-12-06 MED ORDER — HYDROMORPHONE HCL 1 MG/ML IJ SOLN
1.0000 mg | INTRAMUSCULAR | Status: DC | PRN
  Administered 2022-12-06 – 2022-12-07 (×6): 1 mg via INTRAVENOUS
  Filled 2022-12-06 (×6): qty 1

## 2022-12-06 MED ORDER — ARTIFICIAL TEARS OPHTHALMIC OINT
TOPICAL_OINTMENT | OPHTHALMIC | Status: AC
  Filled 2022-12-06: qty 3.5

## 2022-12-06 MED ORDER — MIDAZOLAM HCL 2 MG/2ML IJ SOLN
INTRAMUSCULAR | Status: AC
  Filled 2022-12-06: qty 2

## 2022-12-06 MED ORDER — OXYCODONE HCL 5 MG PO TABS
15.0000 mg | ORAL_TABLET | ORAL | Status: DC | PRN
  Administered 2022-12-06 (×2): 15 mg via ORAL
  Filled 2022-12-06 (×2): qty 3

## 2022-12-06 MED ORDER — POLYETHYLENE GLYCOL 3350 17 G PO PACK: 17.0000 g | PACK | Freq: Every day | ORAL | Status: DC | PRN

## 2022-12-06 MED ORDER — OXYCODONE HCL 5 MG PO TABS
5.0000 mg | ORAL_TABLET | ORAL | Status: DC | PRN
  Administered 2022-12-07: 5 mg via ORAL
  Filled 2022-12-06: qty 1

## 2022-12-06 MED ORDER — FENTANYL CITRATE (PF) 100 MCG/2ML IJ SOLN
INTRAMUSCULAR | Status: AC
  Filled 2022-12-06: qty 2

## 2022-12-06 MED ORDER — PROPOFOL 10 MG/ML IV BOLUS
INTRAVENOUS | Status: AC
  Filled 2022-12-06: qty 20

## 2022-12-06 MED ORDER — MENTHOL 3 MG MT LOZG: 1.0000 | LOZENGE | OROMUCOSAL | Status: DC | PRN

## 2022-12-06 MED ORDER — ACETAMINOPHEN 500 MG PO TABS
1000.0000 mg | ORAL_TABLET | Freq: Once | ORAL | Status: AC
  Administered 2022-12-06: 1000 mg via ORAL

## 2022-12-06 MED ORDER — QUETIAPINE FUMARATE 50 MG PO TABS
50.0000 mg | ORAL_TABLET | Freq: Every evening | ORAL | Status: DC | PRN
  Filled 2022-12-06: qty 1

## 2022-12-06 MED ORDER — BACITRACIN ZINC 500 UNIT/GM EX OINT
TOPICAL_OINTMENT | CUTANEOUS | Status: DC | PRN
  Administered 2022-12-06: 1 via TOPICAL

## 2022-12-06 MED ORDER — SODIUM CHLORIDE 0.9% FLUSH
3.0000 mL | Freq: Two times a day (BID) | INTRAVENOUS | Status: DC
  Administered 2022-12-06 – 2022-12-07 (×3): 3 mL via INTRAVENOUS

## 2022-12-06 MED ORDER — OXYCODONE HCL 5 MG/5ML PO SOLN: 5.0000 mg | Freq: Once | ORAL | Status: AC | PRN

## 2022-12-06 MED ORDER — LIDOCAINE 2% (20 MG/ML) 5 ML SYRINGE
INTRAMUSCULAR | Status: AC
  Filled 2022-12-06: qty 5

## 2022-12-06 MED ORDER — GLYCOPYRROLATE PF 0.2 MG/ML IJ SOSY
PREFILLED_SYRINGE | INTRAMUSCULAR | Status: AC
  Filled 2022-12-06: qty 1

## 2022-12-06 MED ORDER — KETAMINE HCL 10 MG/ML IJ SOLN
INTRAMUSCULAR | Status: DC | PRN
  Administered 2022-12-06: 10 mg via INTRAVENOUS
  Administered 2022-12-06: 20 mg via INTRAVENOUS
  Administered 2022-12-06 (×2): 10 mg via INTRAVENOUS

## 2022-12-06 MED ORDER — CHLORHEXIDINE GLUCONATE CLOTH 2 % EX PADS: 6.0000 | MEDICATED_PAD | Freq: Once | CUTANEOUS | Status: DC

## 2022-12-06 MED ORDER — THROMBIN 5000 UNITS EX SOLR: OROMUCOSAL | Status: DC | PRN

## 2022-12-06 MED ORDER — THROMBIN 5000 UNITS EX SOLR
CUTANEOUS | Status: AC
  Filled 2022-12-06: qty 5000

## 2022-12-06 MED ORDER — ORAL CARE MOUTH RINSE: 15.0000 mL | Freq: Once | OROMUCOSAL | Status: AC

## 2022-12-06 MED ORDER — ONDANSETRON HCL 4 MG/2ML IJ SOLN: 4.0000 mg | Freq: Four times a day (QID) | INTRAMUSCULAR | Status: DC | PRN

## 2022-12-06 MED ORDER — LACTATED RINGERS IV SOLN: INTRAVENOUS | Status: DC

## 2022-12-06 MED ORDER — ALBUMIN HUMAN 5 % IV SOLN: INTRAVENOUS | Status: DC | PRN

## 2022-12-06 MED ORDER — PHENYLEPHRINE 80 MCG/ML (10ML) SYRINGE FOR IV PUSH (FOR BLOOD PRESSURE SUPPORT)
PREFILLED_SYRINGE | INTRAVENOUS | Status: AC
  Filled 2022-12-06: qty 10

## 2022-12-06 MED ORDER — CYCLOBENZAPRINE HCL 10 MG PO TABS
10.0000 mg | ORAL_TABLET | Freq: Three times a day (TID) | ORAL | Status: DC | PRN
  Administered 2022-12-06 – 2022-12-07 (×2): 10 mg via ORAL
  Filled 2022-12-06 (×2): qty 1

## 2022-12-06 MED ORDER — ONDANSETRON HCL 4 MG PO TABS: 4.0000 mg | ORAL_TABLET | Freq: Four times a day (QID) | ORAL | Status: DC | PRN

## 2022-12-06 MED ORDER — MIDAZOLAM HCL 2 MG/2ML IJ SOLN
0.5000 mg | Freq: Once | INTRAMUSCULAR | Status: AC
  Administered 2022-12-06: 0.5 mg via INTRAVENOUS

## 2022-12-06 MED ORDER — ACETAMINOPHEN 650 MG RE SUPP: 650.0000 mg | RECTAL | Status: DC | PRN

## 2022-12-06 MED ORDER — HYDROMORPHONE HCL 1 MG/ML IJ SOLN
0.2500 mg | INTRAMUSCULAR | Status: DC | PRN
  Administered 2022-12-06 (×2): 0.5 mg via INTRAVENOUS
  Administered 2022-12-06: 0.25 mg via INTRAVENOUS
  Administered 2022-12-06: 0.5 mg via INTRAVENOUS
  Administered 2022-12-06: 0.25 mg via INTRAVENOUS

## 2022-12-06 MED ORDER — FENTANYL CITRATE (PF) 250 MCG/5ML IJ SOLN
INTRAMUSCULAR | Status: AC
  Filled 2022-12-06: qty 5

## 2022-12-06 MED ORDER — SODIUM CHLORIDE 0.9 % IV SOLN: 250.0000 mL | INTRAVENOUS | Status: DC

## 2022-12-06 MED ORDER — MIDAZOLAM HCL 2 MG/2ML IJ SOLN
INTRAMUSCULAR | Status: DC | PRN
  Administered 2022-12-06: 2 mg via INTRAVENOUS

## 2022-12-06 MED ORDER — TAMSULOSIN HCL 0.4 MG PO CAPS
0.4000 mg | ORAL_CAPSULE | Freq: Every day | ORAL | Status: DC
  Administered 2022-12-06 – 2022-12-07 (×2): 0.4 mg via ORAL
  Filled 2022-12-06 (×2): qty 1

## 2022-12-06 MED ORDER — GLYCOPYRROLATE PF 0.2 MG/ML IJ SOSY
PREFILLED_SYRINGE | INTRAMUSCULAR | Status: DC | PRN
  Administered 2022-12-06 (×2): .1 mg via INTRAVENOUS

## 2022-12-06 MED ORDER — OXYCODONE-ACETAMINOPHEN 5-325 MG PO TABS
1.0000 | ORAL_TABLET | ORAL | Status: DC | PRN
  Administered 2022-12-07: 1 via ORAL
  Filled 2022-12-06: qty 1

## 2022-12-06 MED ORDER — LIDOCAINE 2% (20 MG/ML) 5 ML SYRINGE
INTRAMUSCULAR | Status: DC | PRN
  Administered 2022-12-06: 80 mg via INTRAVENOUS

## 2022-12-06 MED ORDER — ONDANSETRON HCL 4 MG/2ML IJ SOLN
INTRAMUSCULAR | Status: DC | PRN
  Administered 2022-12-06: 4 mg via INTRAVENOUS

## 2022-12-06 MED ORDER — LAMOTRIGINE 100 MG PO TABS
400.0000 mg | ORAL_TABLET | Freq: Every day | ORAL | Status: DC
  Administered 2022-12-06: 400 mg via ORAL
  Filled 2022-12-06: qty 4

## 2022-12-06 MED ORDER — AMISULPRIDE (ANTIEMETIC) 5 MG/2ML IV SOLN: 10.0000 mg | Freq: Once | INTRAVENOUS | Status: DC | PRN

## 2022-12-06 MED ORDER — LIDOCAINE-EPINEPHRINE 1 %-1:100000 IJ SOLN
INTRAMUSCULAR | Status: AC
  Filled 2022-12-06: qty 1

## 2022-12-06 MED ORDER — ONDANSETRON HCL 4 MG/2ML IJ SOLN
INTRAMUSCULAR | Status: AC
  Filled 2022-12-06: qty 2

## 2022-12-06 MED ORDER — HYDROMORPHONE HCL 1 MG/ML IJ SOLN
INTRAMUSCULAR | Status: AC
  Filled 2022-12-06: qty 1

## 2022-12-06 MED ORDER — FLUOXETINE HCL 20 MG PO CAPS
40.0000 mg | ORAL_CAPSULE | Freq: Every day | ORAL | Status: DC
  Administered 2022-12-06: 40 mg via ORAL
  Filled 2022-12-06: qty 2

## 2022-12-06 MED ORDER — PHENOL 1.4 % MT LIQD: 1.0000 | OROMUCOSAL | Status: DC | PRN

## 2022-12-06 MED ORDER — OXYCODONE HCL 5 MG PO TABS
ORAL_TABLET | ORAL | Status: AC
  Filled 2022-12-06: qty 1

## 2022-12-06 MED ORDER — LACTATED RINGERS IV SOLN: INTRAVENOUS | Status: DC | PRN

## 2022-12-06 MED ORDER — DOCUSATE SODIUM 100 MG PO CAPS
100.0000 mg | ORAL_CAPSULE | Freq: Two times a day (BID) | ORAL | Status: DC
  Administered 2022-12-06 – 2022-12-07 (×3): 100 mg via ORAL
  Filled 2022-12-06 (×3): qty 1

## 2022-12-06 MED ORDER — OXYCODONE HCL 5 MG PO TABS
5.0000 mg | ORAL_TABLET | Freq: Once | ORAL | Status: AC | PRN
  Administered 2022-12-06: 5 mg via ORAL

## 2022-12-06 MED ORDER — EPHEDRINE 5 MG/ML INJ
INTRAVENOUS | Status: AC
  Filled 2022-12-06: qty 5

## 2022-12-06 MED ORDER — NITROGLYCERIN 0.4 MG SL SUBL: 0.4000 mg | SUBLINGUAL_TABLET | SUBLINGUAL | Status: DC | PRN

## 2022-12-06 MED ORDER — PHENYLEPHRINE HCL-NACL 20-0.9 MG/250ML-% IV SOLN
INTRAVENOUS | Status: DC | PRN
  Administered 2022-12-06: 40 ug/min via INTRAVENOUS

## 2022-12-06 MED ORDER — PHENYLEPHRINE 80 MCG/ML (10ML) SYRINGE FOR IV PUSH (FOR BLOOD PRESSURE SUPPORT)
PREFILLED_SYRINGE | INTRAVENOUS | Status: DC | PRN
  Administered 2022-12-06: 160 ug via INTRAVENOUS
  Administered 2022-12-06: 80 ug via INTRAVENOUS
  Administered 2022-12-06 (×2): 160 ug via INTRAVENOUS

## 2022-12-06 MED ORDER — EPHEDRINE SULFATE-NACL 50-0.9 MG/10ML-% IV SOSY
PREFILLED_SYRINGE | INTRAVENOUS | Status: DC | PRN
  Administered 2022-12-06: 5 mg via INTRAVENOUS

## 2022-12-06 MED ORDER — BACITRACIN ZINC 500 UNIT/GM EX OINT
TOPICAL_OINTMENT | CUTANEOUS | Status: AC
  Filled 2022-12-06: qty 28.35

## 2022-12-06 MED ORDER — OXYCODONE-ACETAMINOPHEN 5-325 MG PO TABS: 1.0000 | ORAL_TABLET | Freq: Four times a day (QID) | ORAL | Status: DC | PRN

## 2022-12-06 MED ORDER — PROPOFOL 10 MG/ML IV BOLUS
INTRAVENOUS | Status: DC | PRN
  Administered 2022-12-06: 200 mg via INTRAVENOUS
  Administered 2022-12-06 (×3): 30 mg via INTRAVENOUS

## 2022-12-06 MED ORDER — DEXAMETHASONE SODIUM PHOSPHATE 10 MG/ML IJ SOLN
INTRAMUSCULAR | Status: AC
  Filled 2022-12-06: qty 1

## 2022-12-06 MED ORDER — DEXAMETHASONE SODIUM PHOSPHATE 10 MG/ML IJ SOLN
INTRAMUSCULAR | Status: DC | PRN
  Administered 2022-12-06: 10 mg via INTRAVENOUS

## 2022-12-06 MED ORDER — SUGAMMADEX SODIUM 200 MG/2ML IV SOLN
INTRAVENOUS | Status: DC | PRN
  Administered 2022-12-06: 400 mg via INTRAVENOUS

## 2022-12-06 MED ORDER — OXYCODONE HCL 5 MG PO TABS: 5.0000 mg | ORAL_TABLET | Freq: Four times a day (QID) | ORAL | Status: DC | PRN

## 2022-12-06 MED ORDER — ACETAMINOPHEN 325 MG PO TABS
650.0000 mg | ORAL_TABLET | ORAL | Status: DC | PRN
  Administered 2022-12-06 – 2022-12-07 (×3): 650 mg via ORAL
  Filled 2022-12-06 (×3): qty 2

## 2022-12-06 MED ORDER — OXYCODONE HCL 5 MG PO TABS
5.0000 mg | ORAL_TABLET | ORAL | Status: DC | PRN
  Administered 2022-12-07 (×2): 15 mg via ORAL
  Filled 2022-12-06 (×2): qty 3

## 2022-12-06 MED ORDER — SODIUM CHLORIDE 0.9% FLUSH: 3.0000 mL | INTRAVENOUS | Status: DC | PRN

## 2022-12-06 MED ORDER — CEFAZOLIN SODIUM-DEXTROSE 2-4 GM/100ML-% IV SOLN
2.0000 g | Freq: Three times a day (TID) | INTRAVENOUS | Status: AC
  Administered 2022-12-06 (×2): 2 g via INTRAVENOUS
  Filled 2022-12-06 (×2): qty 100

## 2022-12-06 MED ORDER — OXYCODONE-ACETAMINOPHEN 10-325 MG PO TABS: 1.0000 | ORAL_TABLET | Freq: Four times a day (QID) | ORAL | Status: DC | PRN

## 2022-12-06 MED ORDER — LIDOCAINE-EPINEPHRINE 1 %-1:100000 IJ SOLN
INTRAMUSCULAR | Status: DC | PRN
  Administered 2022-12-06: 10 mL

## 2022-12-06 MED ORDER — 0.9 % SODIUM CHLORIDE (POUR BTL) OPTIME
TOPICAL | Status: DC | PRN
  Administered 2022-12-06: 1000 mL

## 2022-12-06 MED ORDER — FENTANYL CITRATE (PF) 250 MCG/5ML IJ SOLN
INTRAMUSCULAR | Status: DC | PRN
  Administered 2022-12-06: 100 ug via INTRAVENOUS
  Administered 2022-12-06 (×4): 50 ug via INTRAVENOUS

## 2022-12-06 MED ORDER — CEFAZOLIN SODIUM-DEXTROSE 2-4 GM/100ML-% IV SOLN
2.0000 g | INTRAVENOUS | Status: AC
  Administered 2022-12-06: 2 g via INTRAVENOUS
  Filled 2022-12-06: qty 100

## 2022-12-06 MED ORDER — KETAMINE HCL 50 MG/5ML IJ SOSY
PREFILLED_SYRINGE | INTRAMUSCULAR | Status: AC
  Filled 2022-12-06: qty 5

## 2022-12-06 MED ORDER — PANTOPRAZOLE SODIUM 40 MG PO TBEC
40.0000 mg | DELAYED_RELEASE_TABLET | Freq: Every day | ORAL | Status: DC
  Administered 2022-12-06 – 2022-12-07 (×2): 40 mg via ORAL
  Filled 2022-12-06 (×2): qty 1

## 2022-12-06 MED ORDER — ROCURONIUM BROMIDE 10 MG/ML (PF) SYRINGE
PREFILLED_SYRINGE | INTRAVENOUS | Status: AC
  Filled 2022-12-06: qty 10

## 2022-12-06 MED ORDER — FENTANYL CITRATE (PF) 100 MCG/2ML IJ SOLN
25.0000 ug | INTRAMUSCULAR | Status: DC | PRN
  Administered 2022-12-06 (×3): 50 ug via INTRAVENOUS

## 2022-12-06 MED ORDER — ROCURONIUM BROMIDE 10 MG/ML (PF) SYRINGE
PREFILLED_SYRINGE | INTRAVENOUS | Status: DC | PRN
  Administered 2022-12-06: 20 mg via INTRAVENOUS
  Administered 2022-12-06: 10 mg via INTRAVENOUS
  Administered 2022-12-06: 100 mg via INTRAVENOUS

## 2022-12-06 MED ORDER — CHLORHEXIDINE GLUCONATE 0.12 % MT SOLN
15.0000 mL | Freq: Once | OROMUCOSAL | Status: AC
  Administered 2022-12-06: 15 mL via OROMUCOSAL
  Filled 2022-12-06: qty 15

## 2022-12-06 SURGICAL SUPPLY — 66 items
ADH SKN CLS APL DERMABOND .7 (GAUZE/BANDAGES/DRESSINGS) ×2
APL SKNCLS STERI-STRIP NONHPOA (GAUZE/BANDAGES/DRESSINGS)
BAG COUNTER SPONGE SURGICOUNT (BAG) ×3 IMPLANT
BAG SPNG CNTER NS LX DISP (BAG) ×2
BENZOIN TINCTURE PRP APPL 2/3 (GAUZE/BANDAGES/DRESSINGS) IMPLANT
BLADE CLIPPER SURG (BLADE) ×3 IMPLANT
BUR 14 MATCH 3 (BUR) IMPLANT
BUR 15 MATCH 2.2 (BUR) IMPLANT
BUR MATCHSTICK NEURO 3.0 LAGG (BURR) IMPLANT
BUR MR8 14CM BALL SYMTRI 5 (BUR) IMPLANT
BUR PRECISION FLUTE 5.0 (BURR) ×3 IMPLANT
BURR 14 MATCH 3 (BUR)
BURR 15 MATCH 2.2 (BUR) ×2
BURR MR8 14CM BALL SYMTRI 5 (BUR)
CANISTER SUCT 3000ML PPV (MISCELLANEOUS) ×3 IMPLANT
COVERAGE SUPPORT O-ARM STEALTH (MISCELLANEOUS) ×2 IMPLANT
DERMABOND ADVANCED .7 DNX12 (GAUZE/BANDAGES/DRESSINGS) ×3 IMPLANT
DRAPE C-ARM 42X72 X-RAY (DRAPES) ×6 IMPLANT
DRAPE LAPAROTOMY 100X72 PEDS (DRAPES) ×3 IMPLANT
DRAPE SHEET LG 3/4 BI-LAMINATE (DRAPES) ×12 IMPLANT
DURAPREP 6ML APPLICATOR 50/CS (WOUND CARE) ×3 IMPLANT
ELECT REM PT RETURN 9FT ADLT (ELECTROSURGICAL) ×2
ELECTRODE REM PT RTRN 9FT ADLT (ELECTROSURGICAL) ×3 IMPLANT
FEE COVERAGE SUPPORT O-ARM (MISCELLANEOUS) ×3 IMPLANT
GAUZE 4X4 16PLY ~~LOC~~+RFID DBL (SPONGE) IMPLANT
GAUZE SPONGE 4X4 12PLY STRL (GAUZE/BANDAGES/DRESSINGS) IMPLANT
GLOVE BIO SURGEON STRL SZ7.5 (GLOVE) ×6 IMPLANT
GLOVE BIOGEL PI IND STRL 7.0 (GLOVE) ×6 IMPLANT
GLOVE BIOGEL PI IND STRL 7.5 (GLOVE) ×3 IMPLANT
GLOVE ECLIPSE 7.5 STRL STRAW (GLOVE) ×3 IMPLANT
GLOVE EXAM NITRILE LRG STRL (GLOVE) IMPLANT
GLOVE EXAM NITRILE XL STR (GLOVE) IMPLANT
GLOVE EXAM NITRILE XS STR PU (GLOVE) IMPLANT
GOWN STRL REUS W/ TWL LRG LVL3 (GOWN DISPOSABLE) IMPLANT
GOWN STRL REUS W/ TWL XL LVL3 (GOWN DISPOSABLE) ×3 IMPLANT
GOWN STRL REUS W/TWL 2XL LVL3 (GOWN DISPOSABLE) IMPLANT
GOWN STRL REUS W/TWL LRG LVL3 (GOWN DISPOSABLE)
GOWN STRL REUS W/TWL XL LVL3 (GOWN DISPOSABLE) ×2
HEMOSTAT POWDER KIT SURGIFOAM (HEMOSTASIS) ×3 IMPLANT
KIT BASIN OR (CUSTOM PROCEDURE TRAY) ×3 IMPLANT
KIT TURNOVER KIT B (KITS) ×3 IMPLANT
MARKER SPHERE PSV REFLC NDI (MISCELLANEOUS) ×15 IMPLANT
NEEDLE HYPO 22GX1.5 SAFETY (NEEDLE) ×3 IMPLANT
NS IRRIG 1000ML POUR BTL (IV SOLUTION) ×3 IMPLANT
PACK LAMINECTOMY NEURO (CUSTOM PROCEDURE TRAY) ×3 IMPLANT
PAD ARMBOARD 7.5X6 YLW CONV (MISCELLANEOUS) ×9 IMPLANT
PIN MAYFIELD SKULL DISP (PIN) ×3 IMPLANT
PUTTY GRAFTON DBF 6CC W/DELIVE (Putty) IMPLANT
ROD PRECUT 3.5X60 (Rod) IMPLANT
ROD SPINAL PRE CUT 3.5X70 (Rod) IMPLANT
SCREW MULTI AXIAL 4.5X28 (Screw) IMPLANT
SET SCREW INFINITY IFIX THOR (Screw) IMPLANT
SPIKE FLUID TRANSFER (MISCELLANEOUS) ×3 IMPLANT
SPONGE T-LAP 4X18 ~~LOC~~+RFID (SPONGE) IMPLANT
STAPLER VISISTAT 35W (STAPLE) ×3 IMPLANT
SUT ETHILON 3 0 FSL (SUTURE) IMPLANT
SUT MNCRL AB 3-0 PS2 18 (SUTURE) ×3 IMPLANT
SUT MON AB 3-0 SH 27 (SUTURE) ×2
SUT MON AB 3-0 SH27 (SUTURE) ×3 IMPLANT
SUT VIC AB 0 CT1 18XCR BRD8 (SUTURE) ×3 IMPLANT
SUT VIC AB 0 CT1 8-18 (SUTURE) ×2
SUT VIC AB 2-0 CP2 18 (SUTURE) ×3 IMPLANT
TOWEL GREEN STERILE (TOWEL DISPOSABLE) ×3 IMPLANT
TOWEL GREEN STERILE FF (TOWEL DISPOSABLE) ×3 IMPLANT
UNDERPAD 30X36 HEAVY ABSORB (UNDERPADS AND DIAPERS) ×3 IMPLANT
WATER STERILE IRR 1000ML POUR (IV SOLUTION) ×3 IMPLANT

## 2022-12-06 NOTE — Op Note (Signed)
PATIENT: Richard Franco  DAY OF SURGERY: 12/06/22   PRE-OPERATIVE DIAGNOSIS:  C1-2 pseudoarthrosis   POST-OPERATIVE DIAGNOSIS:  Same   PROCEDURE:  Revision of C1-2 posterior spinal instrumented fusion   SURGEON:  Surgeon(s) and Role:    Jadene Pierini, MD   ANESTHESIA: ETGA   BRIEF HISTORY: This is a 57 year old man who previously had a C1-5 PSIF who presented with recurrent neck pain. Workup showed pseudoarthrosis with hardware loosening at C1-2. We discussed options and he wished to proceed with revision.    OPERATIVE DETAIL: The patient was taken to the operating room, anesthesia was induced by the anesthesia team, the Mayfield head holder was placed and the patient was placed on the OR table in the prone position. The Mayfield was attached to the bed and secured. A formal time out was performed with two patient identifiers and confirmed the operative site. The operative site was marked, hair was clipped with surgical clippers, the area was then prepped and draped in a sterile fashion. The patient's prior midline posterior cervical incision was opened and subperiosteal dissection was performed bilaterally.  The prior hardware was exposed, the caps were removed and the rods were removed bilaterally. These were saved to use as templates for new rods, as the patient felt he was in too much of a flexed position. The C1 were grossly loose and removed, the remainder of the screws were solid. The C1-2 joint was dissected and decorticated while the screw heads were out of the way. The C2-3 joints were also decorticated given that there was no obvious fusion mass here. The tracts were palpated and the screws were upsized bilaterally with good purchase. These were then replaced with larger diameter screws with good purchase. Cortical allograft was placed along the C1-2 and C2-3 joints bilaterally and the rods were used to compress this in place along the joints. Prior to placement, the existing  rod was used as a template and a few more degrees of lordosis were introduced, compared to the prior, to allow for more extension. The caps were placed and torqued. The torx driver sheared along its shaft while final tightening the old caps, all parts were recovered and removed and an xray was performed to confirm placement as well as confirm an absence of residual bits of the driver. Hemostasis was obtained and confirmed.   The wound was copiously irrigated, all instrument and sponge counts were correct, and the incision was then closed in layers. The patient was then returned to anesthesia for emergence. No apparent complications at the completion of the procedure.   EBL:  50mL   DRAINS: none   SPECIMENS: none   Jadene Pierini, MD

## 2022-12-06 NOTE — Anesthesia Procedure Notes (Signed)
Procedure Name: Intubation Date/Time: 12/06/2022 7:56 AM  Performed by: Sharyn Dross, CRNAPre-anesthesia Checklist: Patient identified, Emergency Drugs available, Suction available and Patient being monitored Patient Re-evaluated:Patient Re-evaluated prior to induction Oxygen Delivery Method: Circle system utilized Preoxygenation: Pre-oxygenation with 100% oxygen Induction Type: IV induction Ventilation: Mask ventilation without difficulty Laryngoscope Size: Glidescope and 4 Grade View: Grade I Tube type: Oral Tube size: 7.5 mm Number of attempts: 1 Airway Equipment and Method: Oral airway and Rigid stylet Placement Confirmation: ETT inserted through vocal cords under direct vision, positive ETCO2 and breath sounds checked- equal and bilateral Secured at: 22 cm Tube secured with: Tape Dental Injury: Teeth and Oropharynx as per pre-operative assessment

## 2022-12-06 NOTE — Transfer of Care (Signed)
Immediate Anesthesia Transfer of Care Note  Patient: Richard Franco  Procedure(s) Performed: Revision of Cervical One-Two Posterior Cervical Fusion (Spine Cervical) Canceled Application of O-Arm  Patient Location: PACU  Anesthesia Type:General  Level of Consciousness: awake, drowsy, and patient cooperative  Airway & Oxygen Therapy: Patient connected to face mask oxygen  Post-op Assessment: Report given to RN and Post -op Vital signs reviewed and stable  Post vital signs: Reviewed and stable  Last Vitals:  Vitals Value Taken Time  BP 140/56 12/06/22 1054  Temp    Pulse 116 12/06/22 1055  Resp 15 12/06/22 1055  SpO2 96 % 12/06/22 1055  Vitals shown include unvalidated device data.  Last Pain:  Vitals:   12/06/22 0551  TempSrc:   PainSc: 7       Patients Stated Pain Goal: 1 (12/06/22 0551)  Complications: No notable events documented.

## 2022-12-06 NOTE — Anesthesia Postprocedure Evaluation (Signed)
Anesthesia Post Note  Patient: BRYANN DEGENHARDT  Procedure(s) Performed: Revision of Cervical One-Two Posterior Cervical Fusion (Spine Cervical) Canceled Application of O-Arm     Patient location during evaluation: PACU Anesthesia Type: General Level of consciousness: awake and alert Pain management: pain level controlled Vital Signs Assessment: post-procedure vital signs reviewed and stable Respiratory status: spontaneous breathing, nonlabored ventilation, respiratory function stable and patient connected to nasal cannula oxygen Cardiovascular status: blood pressure returned to baseline and stable Postop Assessment: no apparent nausea or vomiting Anesthetic complications: no  No notable events documented.  Last Vitals:  Vitals:   12/06/22 1245 12/06/22 1314  BP: 113/80 123/87  Pulse: (!) 102 (!) 106  Resp: 12 18  Temp: 36.5 C 37 C  SpO2: 96% 97%    Last Pain:  Vitals:   12/06/22 1314  TempSrc: Oral  PainSc:                  Kennieth Rad

## 2022-12-06 NOTE — H&P (Signed)
Surgical H&P Update  HPI: 57 y.o. with a history of prior posterior cervical fusion including C1-2 PSIF with recurrent neck pain, workup showed pseudoarthrosis at C1-2 with hardware loosening. No changes in health since they were last seen. Still having the above and wishes to proceed with surgery.  PMHx:  Past Medical History:  Diagnosis Date   Abnormal chest CT 03/2012   a. 2013: Scattered patchy ground glass opacities and pulmonary nodules. Transbronchial biopsy with no definite etiology of lung finding- possible organizing pneumonia. b. F/u CT 05/2014: stable multiple tiny pulm nodules, no further w/u recommended per notes.    Anxiety    Arthritis    CAD (coronary artery disease)    CHF (congestive heart failure) (HCC)    Chronic midline posterior neck pain    Chronic pain disorder    Complication of anesthesia    " difficult to urinate after "   Depression    Elevated LFTs    Esophageal stricture    Family history of adverse reaction to anesthesia    mom had PONV   Fibromyalgia    Fracture    back   GERD (gastroesophageal reflux disease)    History of kidney stones    Hypercholesteremia    Hypertension    Iron deficiency    Migraine headache    "none lately; I've had alot in my life" (06/21/2015)   NSTEMI (non-ST elevated myocardial infarction) (HCC) 07/2012   a. Normal cath 03/2012. b. 07/2012: troponin 4, normal cors, ?vasospasm. Did not tolerate Imdur due to headache. On amlodipine.   Opacities of both lungs present on chest x-ray    Testosterone deficiency    Tobacco abuse    FamHx:  Family History  Problem Relation Age of Onset   Diabetes Mother    Cancer Mother        ?   Hypertension Mother    Hyperlipidemia Father    Diabetes Father    Coronary artery disease Paternal Uncle    Asthma Sister    Diabetes Brother    Hypertension Brother    Colon cancer Neg Hx    Colon polyps Neg Hx    Rectal cancer Neg Hx    Stomach cancer Neg Hx    SocHx:  reports  that he quit smoking about 4 years ago. His smoking use included cigarettes. He has a 30.00 pack-year smoking history. He quit smokeless tobacco use about 10 years ago.  His smokeless tobacco use included chew. He reports that he does not currently use alcohol. He reports that he does not use drugs.  Physical Exam: Strength 5/5 x4 and SILTx4   Assesment/Plan: 57 y.o. man with C1-2 pseudoarthrosis, here for C1-2 posterior cervical fusion revision. Risks, benefits, and alternatives discussed and the patient would like to continue with surgery.  -OR today -3C post-op  Jadene Pierini, MD 12/06/22 7:29 AM

## 2022-12-07 ENCOUNTER — Encounter (HOSPITAL_COMMUNITY): Payer: Self-pay | Admitting: Neurological Surgery

## 2022-12-07 LAB — TYPE AND SCREEN
ABO/RH(D): O POS
Antibody Screen: POSITIVE
Unit division: 0
Unit division: 0

## 2022-12-07 LAB — BPAM RBC
Blood Product Expiration Date: 202407092359
Blood Product Expiration Date: 202407112359
Unit Type and Rh: 5100
Unit Type and Rh: 5100

## 2022-12-07 MED ORDER — CYCLOBENZAPRINE HCL 10 MG PO TABS: 10.0000 mg | ORAL_TABLET | Freq: Three times a day (TID) | ORAL | 0 refills | Status: DC | PRN

## 2022-12-07 MED ORDER — OXYCODONE-ACETAMINOPHEN 10-325 MG PO TABS: 1.0000 | ORAL_TABLET | ORAL | 0 refills | Status: DC | PRN

## 2022-12-07 NOTE — Plan of Care (Signed)

## 2022-12-07 NOTE — Care Management (Signed)
Patient with order to DC to home today. Unit staff to provide DME needed for home.   No HH needs identified Patient will have family/ friends provide transportation home. No other TOC needs identified for DC 

## 2022-12-07 NOTE — Discharge Instructions (Signed)

## 2022-12-07 NOTE — Discharge Summary (Signed)
Physician Discharge Summary  Patient ID: Richard Franco: [id number] DOB/AGE: Oct 17, 1965 57 y.o.  Admit date: 12/06/2022 Discharge date: 12/07/2022  Admission Diagnoses:  Discharge Diagnoses:  Principal Problem:   Pseudoarthrosis of cervical spine [facility])   Discharged Condition: good  Hospital Course: Patient admitted to the hospital where he underwent uncomplicated revision C1-C2 posterior fusion.  Postoperative doing reasonably well.  Having the expected amount of neck pain.  No new radicular pain or headache.  Standing ambulating and voiding without difficulty.  Ready for discharge home.  Consults:   Significant Diagnostic Studies:   Treatments:   Discharge Exam: Blood pressure 110/66, pulse 84, temperature 98.3 F (36.8 C), temperature source Oral, resp. rate 20, height 5\' 8"  (1.727 m), weight 74.8 kg, SpO2 95 %. Awake and alert.  Oriented and appropriate.  Motor and sensory function intact.  Wound clean and dry.  Chest and abdomen benign.  Disposition: Discharge disposition: 01-Home or Self Care        Allergies as of 12/07/2022       Reactions   Midodrine Hcl Swelling, Other (See Comments)   Tongue swelling   Other Anaphylaxis, Other (See Comments)   Patient has assorted food allergies that are severe enough to require use of an Epi-Pen. They occur sometimes, but not always.   Sulfa Antibiotics Rash, Other (See Comments)   The skin breaks out   Doxycycline Nausea And Vomiting, Other (See Comments)   Oral intake of Doxy aggravated already-present nausea and vomiting, but patient can tolerate via IV         Medication List     TAKE these medications    aspirin EC 81 MG tablet Take 1 tablet (81 mg total) by mouth daily. Restart on 12/13/20   cyclobenzaprine 10 MG tablet Commonly known as: FLEXERIL Take 1 tablet (10 mg total) by mouth 3 (three) times daily as needed for muscle spasms.   EPINEPHrine 0.3 mg/0.3 mL Soaj injection Commonly known as:  EPI-PEN Inject 0.3 mg into the muscle once as needed for anaphylaxis.   esomeprazole 40 MG capsule Commonly known as: NEXIUM TAKE 1 CAPSULE (40 MG TOTAL) BY MOUTH 2 (TWO) TIMES DAILY BEFORE A MEAL.   FLUoxetine 40 MG capsule Commonly known as: PROZAC Take 40 mg by mouth at bedtime.   lamoTRIgine 200 MG tablet Commonly known as: LAMICTAL Take 400 mg by mouth at bedtime.   nitroGLYCERIN 0.4 MG SL tablet Commonly known as: NITROSTAT Place 1 tablet (0.4 mg total) under the tongue every 5 (five) minutes as needed for chest pain.   oxyCODONE-acetaminophen 10-325 MG tablet Commonly known as: PERCOCET Take 1 tablet by mouth every 4 (four) hours as needed for pain. What changed: when to take this   QUEtiapine 50 MG tablet Commonly known as: SEROQUEL Take 50 mg by mouth at bedtime as needed (for sleep).         Signed: Kathaleen Maser Thana Ramp 12/07/2022, 10:04 AM

## 2022-12-07 NOTE — Evaluation (Signed)
Occupational Therapy Evaluation Patient Details Name: Richard Franco MRN: [id number] DOB: 1965-07-24 Today's Date: 12/07/2022   History of Present Illness 57 y.o. male admitted on 12/06/22 s/p Revision of C1-2 Cervical fusion. PMH significant for CAD, CHF, Multiple cervical surgeries, HTN, GERD.   Clinical Impression   Pt admitted for procedure listed above. PTA pt reported that he was independent with all ADL's and IADL's, including working as a Surveyor, [employer]. At this time pt reports increased pain and discomfort from his cervical surgery, however remains independent with all mobility and basic ADL's, utilizing compensatory strategies as needed. At this time, pt has no further skilled OT needs and will be discharged from acute OT.      Recommendations for follow up therapy are one component of a multi-disciplinary discharge planning process, led by the attending physician.  Recommendations may be updated based on patient status, additional functional criteria and insurance authorization.   Assistance Recommended at Discharge PRN  Patient can return home with the following Assistance with cooking/housework    Functional Status Assessment  Patient has had a recent decline in their functional status and demonstrates the ability to make significant improvements in function in a reasonable and predictable amount of time.  Equipment Recommendations  None recommended by OT    Recommendations for Other Services       Precautions / Restrictions Precautions Precautions: Cervical Precaution Booklet Issued: Yes (comment) Precaution Comments: Reviewed all precautions and compensatory strategies. Restrictions Weight Bearing Restrictions: No      Mobility Bed Mobility Overal bed mobility: Independent                  Transfers Overall transfer level: Modified independent Equipment used: None               General transfer comment: Increased time and slowed gait       Balance Overall balance assessment: Mild deficits observed, not formally tested                                         ADL either performed or assessed with clinical judgement   ADL Overall ADL's : Modified independent                                       General ADL Comments: Able to complete ADL's with increased time and compensatory strategeis     Vision Baseline Vision/History: 0 No visual deficits Ability to See in Adequate Light: 0 Adequate Patient Visual Report: No change from baseline Vision Assessment?: No apparent visual deficits     Perception Perception Perception Tested?: No   Praxis Praxis Praxis tested?: Not tested    Pertinent Vitals/Pain Pain Assessment Pain Assessment: 0-10 Pain Score: 7  Pain Location: Surgical site and shoulders Pain Descriptors / Indicators: Aching, Discomfort, Grimacing Pain Intervention(s): Limited activity within patient's tolerance, Monitored during session, Repositioned     Hand Dominance Right   Extremity/Trunk Assessment Upper Extremity Assessment Upper Extremity Assessment: Overall WFL for tasks assessed   Lower Extremity Assessment Lower Extremity Assessment: Overall WFL for tasks assessed   Cervical / Trunk Assessment Cervical / Trunk Assessment: Neck Surgery   Communication Communication Communication: No difficulties   Cognition Arousal/Alertness: Awake/alert Behavior During Therapy: WFL for tasks assessed/performed Overall Cognitive Status: Within Functional Limits for tasks assessed  General Comments  VSS on RA, Dressing intact and appears dry    Exercises     Shoulder Instructions      Home Living Family/patient expects to be discharged to:: Private residence Living Arrangements: Spouse/significant other Available Help at Discharge: Family;Available 24 hours/day Type of Home: House Home Access: Stairs to  enter [employer] of Steps: 4-5 steps Entrance Stairs-Rails: Can reach both Home Layout: One level     Bathroom Shower/Tub: Chief Strategy Officer: Standard     Home Equipment: None          Prior Functioning/Environment Prior Level of Function : Independent/Modified Independent             Mobility Comments: Indep ADLs Comments: Indep        OT Problem List: Decreased strength;Decreased activity tolerance      OT Treatment/Interventions:      OT Goals(Current goals can be found in the care plan section) Acute Rehab OT Goals Patient Stated Goal: To reduce pain OT Goal Formulation: With patient Time For Goal Achievement: 12/21/22 Potential to Achieve Goals: Good  OT Frequency:      Co-evaluation              AM-PAC OT "6 Clicks" Daily Activity     Outcome Measure Help from another person eating meals?: None Help from another person taking care of personal grooming?: None Help from another person toileting, which includes using toliet, bedpan, or urinal?: None Help from another person bathing (including washing, rinsing, drying)?: None Help from another person to put on and taking off regular upper body clothing?: None Help from another person to put on and taking off regular lower body clothing?: None 6 Click Score: 24   End of Session Nurse Communication: Mobility status  Activity Tolerance: Patient tolerated treatment well Patient left: in bed;with call bell/phone within reach  OT Visit Diagnosis: Unsteadiness on feet (R26.81);Muscle weakness (generalized) (M62.81)                Time: 1610-9604 OT Time Calculation (min): 18 min Charges:  OT General Charges $OT Visit: 1 Visit OT Evaluation $OT Eval Moderate Complexity: 1 Mod  Nimesh Riolo Bing Plume, OTR/L [facility]  Alizey Noren Elane Bing Plume 12/07/2022, 8:40 AM

## 2022-12-07 NOTE — Progress Notes (Signed)
PT Cancellation Note and Discharge  Patient Details Name: Richard Franco MRN: [id number] DOB: [birth year]   Cancelled Treatment:    Reason Eval/Treat Not Completed: PT screened, no needs identified, will sign off. Discussed pt case with OT who reports pt is currently mobilizing at a modified independent level and does not require a formal PT evaluation at this time. PT signing off. If needs change, please reconsult.     Marylynn Pearson 12/07/2022, 8:49 AM  Conni Slipper, PT, DPT Acute Rehabilitation Services Secure Chat Preferred Office: [phone]

## 2022-12-22 ENCOUNTER — Emergency Department (HOSPITAL_BASED_OUTPATIENT_CLINIC_OR_DEPARTMENT_OTHER)
Admission: EM | Admit: 2022-12-22 | Discharge: 2022-12-22 | Disposition: A | Discharge status: Home / Self Care | Payer: Medicare Other | Attending: Emergency Medicine | Admitting: Emergency Medicine

## 2022-12-22 ENCOUNTER — Other Ambulatory Visit: Payer: Self-pay

## 2022-12-22 ENCOUNTER — Encounter (HOSPITAL_BASED_OUTPATIENT_CLINIC_OR_DEPARTMENT_OTHER): Payer: Self-pay

## 2022-12-22 DIAGNOSIS — R21 Rash and other nonspecific skin eruption: Secondary | ICD-10-CM | POA: Diagnosis present

## 2022-12-22 DIAGNOSIS — Z7982 Long term (current) use of aspirin: Secondary | ICD-10-CM | POA: Insufficient documentation

## 2022-12-22 MED ORDER — ONDANSETRON 4 MG PO TBDP: 4.0000 mg | ORAL_TABLET | Freq: Three times a day (TID) | ORAL | 0 refills | Status: AC | PRN

## 2022-12-22 MED ORDER — DOXYCYCLINE HYCLATE 100 MG PO CAPS: 100.0000 mg | ORAL_CAPSULE | Freq: Two times a day (BID) | ORAL | 0 refills | Status: AC

## 2022-12-22 NOTE — ED Provider Notes (Signed)
[place] [facility] AT [facility] Provider Note   CSN: [id number] Arrival date & time: 12/22/22  0746     History  Chief Complaint  Patient presents with   Rash    Richard Franco is a 57 y.o. male.  Patient here with rash and tick bite.  Pulled the tick off this morning.  Has been pulling off ticks from him here recently while doing some work outside.  Denies any headache fever or chills.  Nothing makes it worse or better.  Denies any chest pain shortness of breath.  The history is provided by the patient.       Home Medications Prior to Admission medications   Medication Sig Start Date End Date Taking? Authorizing Provider  doxycycline (VIBRAMYCIN) 100 MG capsule Take 1 capsule (100 mg total) by mouth 2 (two) times daily for 10 days. 12/22/22 01/01/23 Yes Haywood Meinders, DO  ondansetron (ZOFRAN-ODT) 4 MG disintegrating tablet Take 1 tablet (4 mg total) by mouth every 8 (eight) hours as needed. 12/22/22  Yes Ellarie Picking, DO  aspirin 81 MG EC tablet Take 1 tablet (81 mg total) by mouth daily. Restart on 12/13/20 12/08/20   Jadene Pierini, MD  cyclobenzaprine (FLEXERIL) 10 MG tablet Take 1 tablet (10 mg total) by mouth 3 (three) times daily as needed for muscle spasms. 12/07/22   Julio Sicks, MD  EPINEPHrine 0.3 mg/0.3 mL IJ SOAJ injection Inject 0.3 mg into the muscle once as needed for anaphylaxis. 07/14/19   [provider]  esomeprazole (NEXIUM) 40 MG capsule TAKE 1 CAPSULE (40 MG TOTAL) BY MOUTH 2 (TWO) TIMES DAILY BEFORE A MEAL. 05/27/22   Unk Lightning, PA  FLUoxetine (PROZAC) 40 MG capsule Take 40 mg by mouth at bedtime.    [provider]  lamoTRIgine (LAMICTAL) 200 MG tablet Take 400 mg by mouth at bedtime.    [provider]  nitroGLYCERIN (NITROSTAT) 0.4 MG SL tablet Place 1 tablet (0.4 mg total) under the tongue every 5 (five) minutes as needed for chest pain. 01/16/22   Gareth Eagle, PA-C   oxyCODONE-acetaminophen (PERCOCET) 10-325 MG tablet Take 1 tablet by mouth every 4 (four) hours as needed for pain. 12/07/22   Julio Sicks, MD  QUEtiapine (SEROQUEL) 50 MG tablet Take 50 mg by mouth at bedtime as needed (for sleep). 04/16/20   [provider]      Allergies    Midodrine hcl, Other, Sulfa antibiotics, and Doxycycline    Review of Systems   Review of Systems  Physical Exam Updated Vital Signs BP 134/84 (BP Location: Right Arm)   Pulse 99   Temp 98.3 F (36.8 C) (Oral)   Resp 18   SpO2 97%  Physical Exam Vitals and nursing note reviewed.  Constitutional:      General: He is not in acute distress.    Appearance: He is well-developed.  HENT:     Head: Normocephalic and atraumatic.  Eyes:     Extraocular Movements: Extraocular movements intact.     Conjunctiva/sclera: Conjunctivae normal.     Pupils: Pupils are equal, round, and reactive to light.  Cardiovascular:     Rate and Rhythm: Normal rate and regular rhythm.     Heart sounds: No murmur heard. Pulmonary:     Effort: Pulmonary effort is normal. No respiratory distress.     Breath sounds: Normal breath sounds.  Abdominal:     Palpations: Abdomen is soft.     Tenderness: There  is no abdominal tenderness.  Musculoskeletal:        General: No swelling.     Cervical back: Neck supple.  Skin:    General: Skin is warm and dry.     Capillary Refill: Capillary refill takes less than 2 seconds.     Comments: Some areas on his legs and chest of little maculopapular type rash  Neurological:     Mental Status: He is alert.  Psychiatric:        Mood and Affect: Mood normal.     ED Results / Procedures / Treatments   Labs (all labs ordered are listed, but only abnormal results are displayed) Labs Reviewed - No data to display  EKG None  Radiology No results found.  Procedures Procedures    Medications Ordered in ED Medications - No data to display  ED Course/ Medical Decision Making/  A&P                             Medical Decision Making  TAYSEAN STANCZAK is here with rash may be tick bite.  Normal vitals.  No fever.  Seems like he took a tick off of him this morning.  Will empirically treat with doxycycline but I do not think he has any evidence of disseminated [facility] spotted fever or Lyme disease.  He is got sort of a nonspecific maculopapular type rash.  Possible that this could be heat related type rash or viral related type rash.  He is very well-appearing.  He understands return precautions.  Discharged in good condition.  This chart was dictated using voice recognition software.  Despite best efforts to proofread,  errors can occur which can change the documentation meaning.         Final Clinical Impression(s) / ED Diagnoses Final diagnoses:  Rash    Rx / DC Orders ED Discharge Orders          Ordered    doxycycline (VIBRAMYCIN) 100 MG capsule  2 times daily        12/22/22 0801    ondansetron (ZOFRAN-ODT) 4 MG disintegrating tablet  Every 8 hours PRN        12/22/22 0801              Virgina Norfolk, DO 12/22/22 1610

## 2022-12-22 NOTE — ED Triage Notes (Signed)
Pt noticed a rash on his ankle on Thursday and has gotten worse, states he has been dizzy and hot and sweaty. Pt found tick in his groin area this morning.

## 2022-12-24 ENCOUNTER — Encounter (HOSPITAL_BASED_OUTPATIENT_CLINIC_OR_DEPARTMENT_OTHER): Payer: Self-pay | Admitting: Urology

## 2022-12-24 ENCOUNTER — Other Ambulatory Visit (HOSPITAL_BASED_OUTPATIENT_CLINIC_OR_DEPARTMENT_OTHER): Payer: Self-pay

## 2022-12-24 ENCOUNTER — Emergency Department (HOSPITAL_BASED_OUTPATIENT_CLINIC_OR_DEPARTMENT_OTHER)
Admission: EM | Admit: 2022-12-24 | Discharge: 2022-12-24 | Disposition: A | Discharge status: Home / Self Care | Payer: Medicare Other | Attending: Emergency Medicine | Admitting: Emergency Medicine

## 2022-12-24 ENCOUNTER — Other Ambulatory Visit: Payer: Self-pay

## 2022-12-24 DIAGNOSIS — R21 Rash and other nonspecific skin eruption: Secondary | ICD-10-CM | POA: Insufficient documentation

## 2022-12-24 DIAGNOSIS — Z87891 Personal history of nicotine dependence: Secondary | ICD-10-CM | POA: Diagnosis not present

## 2022-12-24 DIAGNOSIS — Z7982 Long term (current) use of aspirin: Secondary | ICD-10-CM | POA: Insufficient documentation

## 2022-12-24 MED ORDER — TRIAMCINOLONE ACETONIDE 0.1 % EX CREA
1.0000 | TOPICAL_CREAM | Freq: Two times a day (BID) | CUTANEOUS | 0 refills | Status: DC
  Filled 2022-12-24: qty 75, 30d supply, fill #0

## 2022-12-24 MED ORDER — PREDNISONE 20 MG PO TABS
40.0000 mg | ORAL_TABLET | Freq: Every day | ORAL | 0 refills | Status: AC
  Filled 2022-12-24: qty 12, 6d supply, fill #0

## 2022-12-24 NOTE — ED Notes (Signed)
D/c paperwork reviewed with pt, including prescriptions and follow up care.  All questions and/or concerns addressed at time of d/c.  No further needs expressed. . Pt verbalized understanding, Ambulatory without assistance to ED exit, NAD.   

## 2022-12-24 NOTE — ED Triage Notes (Signed)
Pt here on 6/30 for rash to left ankle, was started on doxycycline and it is not getting better  Rash now generalized all over body  States itching worsening

## 2022-12-24 NOTE — ED Provider Notes (Signed)
[place] [facility] Provider Note   CSN: [id number] Arrival date & time: 12/24/22  1457     History  Chief Complaint  Patient presents with   Rash    Richard Franco is a 57 y.o. male.   Rash   57 year old male presents emergency department with complaints of rash.  Patient reports rash beginning on left ankle on 6/27 and noted progressing of rash of left upper extremity prompting visit to the emergency department on the 30th of last month.  Patient states that he found a tick in his right inguinal region and was placed on antibiotics in the form of doxycycline at that emergency department visit.  Patient states that since then, rash spread to bilateral lower extremities, lower abdomen, lower back with no involvement of the palms or soles.  Describes rash as itchy and painful.  Denies any fever, chills, cough, congestion, sore throat, abdominal pain, nausea, vomiting.  Patient does state that 2 days prior to symptom onset, noticed loose bowel movements.  Past medical history significant for  Home Medications Prior to Admission medications   Medication Sig Start Date End Date Taking? Authorizing Provider  predniSONE (DELTASONE) 20 MG tablet Take 2 tablets (40 mg total) by mouth daily with breakfast for 6 days. 12/24/22 12/30/22 Yes Sherian Maroon A, PA  triamcinolone cream (KENALOG) 0.1 % Apply 1 Application topically 2 (two) times daily. Do not apply to face, body folds or genital area. Do not apply for > 14 days 12/24/22  Yes Sherian Maroon A, PA  aspirin 81 MG EC tablet Take 1 tablet (81 mg total) by mouth daily. Restart on 12/13/20 12/08/20   Jadene Pierini, MD  cyclobenzaprine (FLEXERIL) 10 MG tablet Take 1 tablet (10 mg total) by mouth 3 (three) times daily as needed for muscle spasms. 12/07/22   Julio Sicks, MD  doxycycline (VIBRAMYCIN) 100 MG capsule Take 1 capsule (100 mg total) by mouth 2 (two) times daily for 10 days. 12/22/22 01/01/23   Curatolo, Adam, DO  EPINEPHrine 0.3 mg/0.3 mL IJ SOAJ injection Inject 0.3 mg into the muscle once as needed for anaphylaxis. 07/14/19   [provider]  esomeprazole (NEXIUM) 40 MG capsule TAKE 1 CAPSULE (40 MG TOTAL) BY MOUTH 2 (TWO) TIMES DAILY BEFORE A MEAL. 05/27/22   Unk Lightning, PA  FLUoxetine (PROZAC) 40 MG capsule Take 40 mg by mouth at bedtime.    [provider]  lamoTRIgine (LAMICTAL) 200 MG tablet Take 400 mg by mouth at bedtime.    [provider]  nitroGLYCERIN (NITROSTAT) 0.4 MG SL tablet Place 1 tablet (0.4 mg total) under the tongue every 5 (five) minutes as needed for chest pain. 01/16/22   Gareth Eagle, PA-C  ondansetron (ZOFRAN-ODT) 4 MG disintegrating tablet Take 1 tablet (4 mg total) by mouth every 8 (eight) hours as needed. 12/22/22   Curatolo, Adam, DO  oxyCODONE-acetaminophen (PERCOCET) 10-325 MG tablet Take 1 tablet by mouth every 4 (four) hours as needed for pain. 12/07/22   Julio Sicks, MD  QUEtiapine (SEROQUEL) 50 MG tablet Take 50 mg by mouth at bedtime as needed (for sleep). 04/16/20   [provider]      Allergies    Midodrine hcl, Other, Sulfa antibiotics, and Doxycycline    Review of Systems   Review of Systems  Skin:  Positive for rash.    Physical Exam Updated Vital Signs BP 115/83   Pulse 98   Temp 97.7 F (  36.5 C)   Resp 15   Ht 5\' 8"  (1.727 m)   Wt 75 kg   SpO2 96%   BMI 25.14 kg/m  Physical Exam Vitals and nursing note reviewed.  Constitutional:      General: He is not in acute distress.    Appearance: He is well-developed.  HENT:     Head: Normocephalic and atraumatic.  Eyes:     Conjunctiva/sclera: Conjunctivae normal.  Cardiovascular:     Rate and Rhythm: Normal rate and regular rhythm.     Heart sounds: No murmur heard. Pulmonary:     Effort: Pulmonary effort is normal. No respiratory distress.     Breath sounds: Normal breath sounds.  Abdominal:     Palpations: Abdomen is soft.      Tenderness: There is no abdominal tenderness.  Musculoskeletal:        General: No swelling.     Cervical back: Neck supple.  Skin:    General: Skin is warm and dry.     Capillary Refill: Capillary refill takes less than 2 seconds.     Findings: Rash present.  Neurological:     Mental Status: He is alert.  Psychiatric:        Mood and Affect: Mood normal.            ED Results / Procedures / Treatments   Labs (all labs ordered are listed, but only abnormal results are displayed) Labs Reviewed - No data to display  EKG None  Radiology No results found.  Procedures Procedures    Medications Ordered in ED Medications - No data to display  ED Course/ Medical Decision Making/ A&P                             Medical Decision Making Risk Prescription drug management.   This patient presents to the ED for concern of rash, this involves an extensive number of treatment options, and is a complaint that carries with it a high risk of complications and morbidity.  The differential diagnosis includes SJS/TEN, Lyme, RMSF, cellulitis, erysipelas's, DIC, HSP, viral exanthem, contact dermatitis,   Co morbidities that complicate the patient evaluation  See HPI   Additional history obtained:  Additional history obtained from EMR External records from outside source obtained and reviewed including hospital records   Lab Tests:  N/a   Imaging Studies ordered:  N/a   Cardiac Monitoring: / EKG:  The patient was maintained on a cardiac monitor.  I personally viewed and interpreted the cardiac monitored which showed an underlying rhythm of: Sinus rhythm   Consultations Obtained:  N/a   Problem List / ED Course / Critical interventions / Medication management  Rash Reevaluation of the patient showed that the patient stayed the same I have reviewed the patients home medicines and have made adjustments as needed   Social Determinants of  Health:  Former cigarette use.  Denies illicit drug use.   Test / Admission - Considered:  Rash Vitals signs within normal range and stable throughout visit. 57 year old male presents emergency department with complaints of rash since the 27th of last week with known tick bite in right inguinal region.  Patient discharged on the 30th with doxycycline for possible tickborne illness.  Rash not consistent with SJS/TN or concerning for vasculitis, meningococcemia.  Rash not consistent with bacterial infectious process such as cellulitis, erysipelas, necrotizing fasciitis.  Patient declines any family member in house with similar  rash suspicion for scabies or bedbugs.  Patient was with viral symptoms 2 to 3 days prior to symptom onset so could be viral exanthem.  Patient with also known tick bite prior to rash onset syncope secondary to tickborne illness such as RMSF.  Will treat with topical steroids in addition to doxycycline patient is already on and recommend close follow-up with primary care/dermatology in the outpatient setting for further assessment/evaluation of rash.  Additional symptomatic therapy recommended at home with oat bath, antihistamine therapy.  Treatment plan discussed at length with patient and he acknowledged understanding was agreeable to said plan.  Patient overall well-appearing, afebrile in no acute distress. Worrisome signs and symptoms were discussed with the patient, and the patient acknowledged understanding to return to the ED if noticed. Patient was stable upon discharge.          Final Clinical Impression(s) / ED Diagnoses Final diagnoses:  Rash    Rx / DC Orders ED Discharge Orders          Ordered    triamcinolone cream (KENALOG) 0.1 %  2 times daily        12/24/22 1630              Peter Garter, [place] 12/24/22 1747    Cathren Laine, MD 12/25/22 1525

## 2022-12-24 NOTE — Discharge Instructions (Signed)
As discussed, rash does not seem life-threatening and most likely product of viral illness.  Will treat this with steroids as we discussed and recommend continue use of Benadryl cream as needed for itch in the outpatient setting as well as oral allergy medicine such as Zyrtec/Claritin/Allegra.  Recommend follow-up with primary care within 2 to 3 days for reassessment of rash.  Please do not hesitate to return to emergency department for worrisome signs and symptoms we discussed become apparent.

## 2023-03-27 IMAGING — CT CT ANGIO NECK
2 of 7 series · 8 of 33 positions shown · IV contrast (OMNIPAQUE)
Comparison: Cervical myelogram 01/05/2019

CLINICAL DATA: Chronic neck pain.  Surgical planning.

EXAM:
CT ANGIOGRAPHY NECK
TECHNIQUE: Multidetector CT imaging of the neck was performed using the
standard protocol during bolus administration of intravenous
contrast. Multiplanar CT image reconstructions and MIPs were
obtained to evaluate the vascular anatomy. Carotid stenosis
measurements (when applicable) are obtained utilizing NASCET
criteria, using the distal internal carotid diameter as the
denominator.
CONTRAST:  80mL OMNIPAQUE IOHEXOL 350 MG/ML SOLN

[Series 5: cta neck (person_name) · axial · 0.39mm/px · z∈[+1089,+1171]mm · 2 of 125 slices shown]
[im 42/125  soft-tissue]
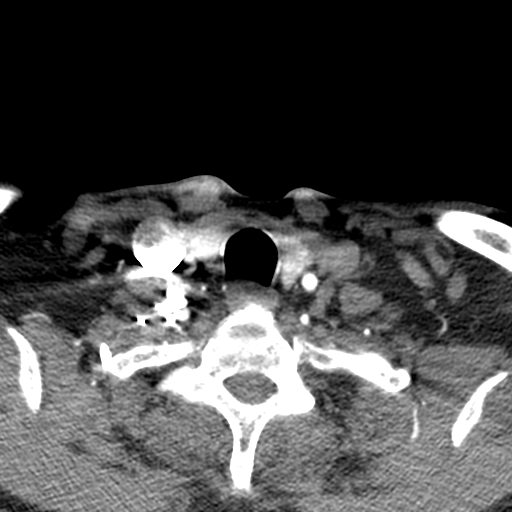
[im 83/125  soft-tissue]
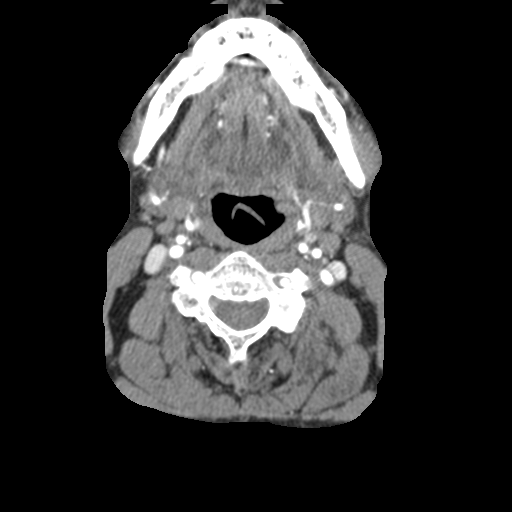

[Series 7: ax thin · axial · 0.39mm/px · z∈[+1042,+1220]mm · 6 of 250 slices shown]
[im 36/250  soft-tissue]
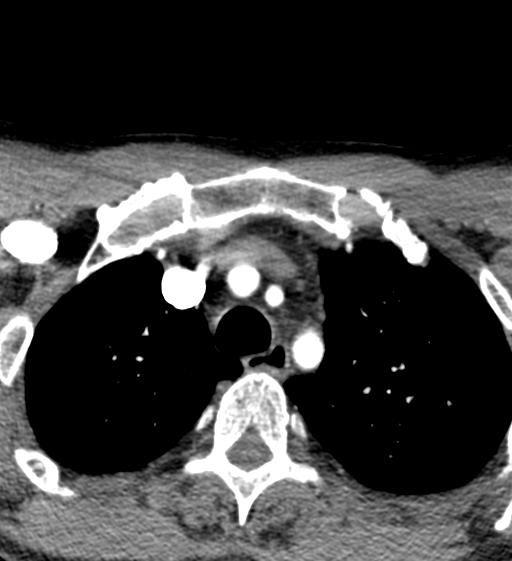
[im 72/250  bone]
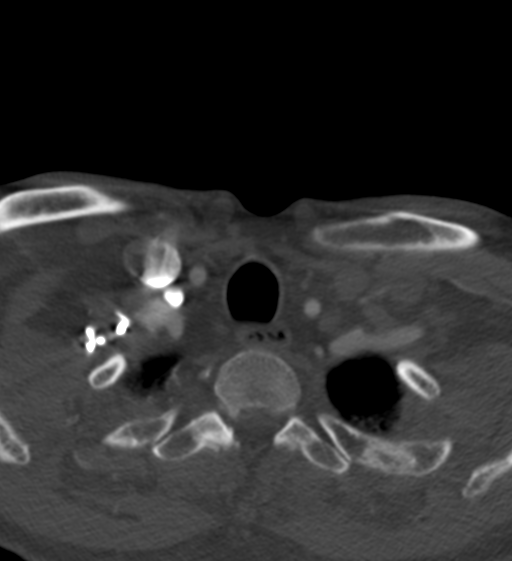
[im 107/250  soft-tissue]
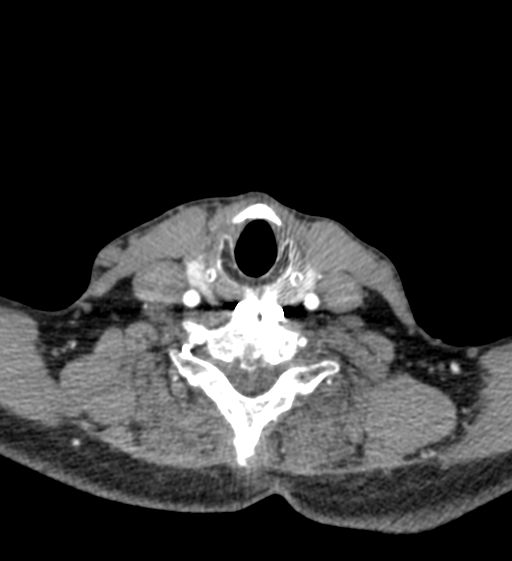
[im 143/250  bone]
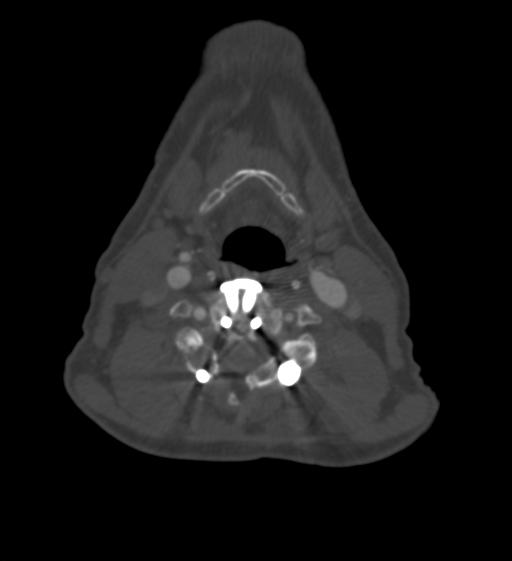
[im 178/250  soft-tissue]
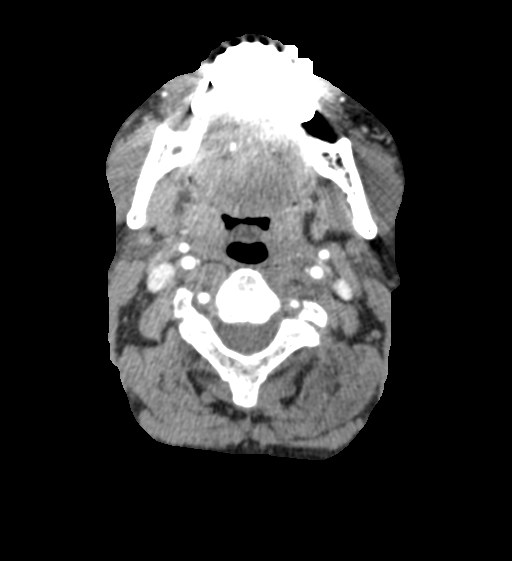
[im 214/250  bone]
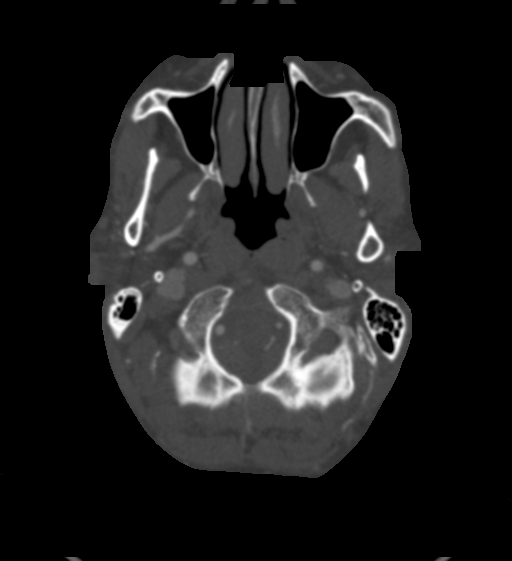

[8 of 33 positions shown; findings below may reference images not displayed]

FINDINGS: Aortic arch: A 3 vessel arch configuration is present. No
significant atherosclerotic disease, stenosis, or aneurysm is
present.

Right carotid system: Right common carotid artery is within normal
limits. Bifurcation is unremarkable. Cervical right ICA is normal.

Left carotid system: The left common carotid artery is within normal
limits. Bifurcation is unremarkable. Cervical left ICA is normal.

Vertebral arteries: The right vertebral artery is the dominant
vessel. Both vertebral arteries originate from the subclavian
arteries without significant stenosis. There is no significant
stenosis in either vertebral artery in the neck. Vertebral arteries
do not extend into the foramina at any level.

Skeleton: Cervical fusion noted C3-7 anteriorly. Posterior elements
are fused see 3-7 on the left and C4-7 on the right. No focal lytic
or blastic lesions are present. Dental implants noted.

Other neck: The soft tissues the neck are otherwise unremarkable.

Upper chest: Lung apices are clear. Thoracic inlet is within normal
limits.
IMPRESSION: 1. Normal CTA of the neck. No significant stenosis or occlusion.
2. Vertebral arteries do not enter the neural foramina at any level.
3. Cervical fusion C3-7 anteriorly and posteriorly.

## 2023-06-13 ENCOUNTER — Other Ambulatory Visit: Payer: Self-pay | Admitting: *Deleted

## 2023-06-13 MED ORDER — ESOMEPRAZOLE MAGNESIUM 40 MG PO CPDR: 40.0000 mg | DELAYED_RELEASE_CAPSULE | Freq: Two times a day (BID) | ORAL | 0 refills | Status: AC

## 2023-08-12 ENCOUNTER — Emergency Department (HOSPITAL_BASED_OUTPATIENT_CLINIC_OR_DEPARTMENT_OTHER)
Admission: EM | Admit: 2023-08-12 | Discharge: 2023-08-12 | Disposition: A | Discharge status: Home / Self Care | Payer: Medicare Other | Attending: Emergency Medicine | Admitting: Emergency Medicine

## 2023-08-12 ENCOUNTER — Encounter (HOSPITAL_BASED_OUTPATIENT_CLINIC_OR_DEPARTMENT_OTHER): Payer: Self-pay

## 2023-08-12 ENCOUNTER — Other Ambulatory Visit: Payer: Self-pay

## 2023-08-12 DIAGNOSIS — Z7982 Long term (current) use of aspirin: Secondary | ICD-10-CM | POA: Diagnosis not present

## 2023-08-12 DIAGNOSIS — M542 Cervicalgia: Secondary | ICD-10-CM | POA: Diagnosis present

## 2023-08-12 MED ORDER — HYDROMORPHONE HCL 1 MG/ML IJ SOLN
1.0000 mg | Freq: Once | INTRAMUSCULAR | Status: AC
  Administered 2023-08-12: 1 mg via INTRAMUSCULAR
  Filled 2023-08-12: qty 1

## 2023-08-12 MED ORDER — ACETAMINOPHEN 500 MG PO TABS
1000.0000 mg | ORAL_TABLET | Freq: Once | ORAL | Status: AC
  Administered 2023-08-12: 1000 mg via ORAL
  Filled 2023-08-12: qty 2

## 2023-08-12 MED ORDER — KETOROLAC TROMETHAMINE 15 MG/ML IJ SOLN
15.0000 mg | Freq: Once | INTRAMUSCULAR | Status: AC
  Administered 2023-08-12: 15 mg via INTRAMUSCULAR
  Filled 2023-08-12: qty 1

## 2023-08-12 MED ORDER — DIAZEPAM 5 MG PO TABS
5.0000 mg | ORAL_TABLET | Freq: Once | ORAL | Status: AC
  Administered 2023-08-12: 5 mg via ORAL
  Filled 2023-08-12: qty 1

## 2023-08-12 NOTE — ED Provider Notes (Signed)
[place] [facility] AT [facility] Provider Note   CSN: [id number] Arrival date & time: 08/12/23  1628     History  Chief Complaint  Patient presents with   Neck Pain    Richard Franco is a 58 y.o. male.  58 yo M with a chief complaints of neck pain.  Patient states he is having a flare of his neck pain.  He gets this from time to time.  Typically gets better on its own.  Sometimes he has to come to the emergency department to get medications.  He said he had multiple different ones over the years he is not sure what works the best.  York Spaniel usually so the narcotics to get him more under control and then he goes home and things improved.  He denies any trauma to the neck.  Denies numbness to the arms or legs.  Denies weakness.   Neck Pain      Home Medications Prior to Admission medications   Medication Sig Start Date End Date Taking? Authorizing Provider  aspirin 81 MG EC tablet Take 1 tablet (81 mg total) by mouth daily. Restart on 12/13/20 12/08/20   Jadene Pierini, MD  cyclobenzaprine (FLEXERIL) 10 MG tablet Take 1 tablet (10 mg total) by mouth 3 (three) times daily as needed for muscle spasms. 12/07/22   Julio Sicks, MD  EPINEPHrine 0.3 mg/0.3 mL IJ SOAJ injection Inject 0.3 mg into the muscle once as needed for anaphylaxis. 07/14/19   [provider]  esomeprazole (NEXIUM) 40 MG capsule Take 1 capsule (40 mg total) by mouth 2 (two) times daily before a meal. 06/13/23   Lemmon, Violet Baldy, PA  FLUoxetine (PROZAC) 40 MG capsule Take 40 mg by mouth at bedtime.    [provider]  lamoTRIgine (LAMICTAL) 200 MG tablet Take 400 mg by mouth at bedtime.    [provider]  nitroGLYCERIN (NITROSTAT) 0.4 MG SL tablet Place 1 tablet (0.4 mg total) under the tongue every 5 (five) minutes as needed for chest pain. 01/16/22   Gareth Eagle, PA-C  ondansetron (ZOFRAN-ODT) 4 MG disintegrating tablet Take 1 tablet (4 mg total) by mouth  every 8 (eight) hours as needed. 12/22/22   Curatolo, Adam, DO  oxyCODONE-acetaminophen (PERCOCET) 10-325 MG tablet Take 1 tablet by mouth every 4 (four) hours as needed for pain. 12/07/22   Julio Sicks, MD  QUEtiapine (SEROQUEL) 50 MG tablet Take 50 mg by mouth at bedtime as needed (for sleep). 04/16/20   [provider]  triamcinolone cream (KENALOG) 0.1 % Apply 1 Application topically 2 (two) times daily. Do not apply to face, body folds or genital area. Do not apply for > 14 days 12/24/22   Sherian Maroon A, PA      Allergies    Midodrine hcl, Other, Sulfa antibiotics, and Doxycycline    Review of Systems   Review of Systems  Musculoskeletal:  Positive for neck pain.    Physical Exam Updated Vital Signs BP 116/80   Pulse 84   Temp 97.7 F (36.5 C)   Resp 18   Wt 73 kg   SpO2 99%   BMI 24.48 kg/m  Physical Exam Vitals and nursing note reviewed.  Constitutional:      Appearance: He is well-developed.  HENT:     Head: Normocephalic and atraumatic.  Eyes:     Pupils: Pupils are equal, round, and reactive to light.  Neck:     Vascular: No JVD.  Cardiovascular:     Rate and Rhythm: Normal rate and regular rhythm.     Heart sounds: No murmur heard.    No friction rub. No gallop.  Pulmonary:     Effort: No respiratory distress.     Breath sounds: No wheezing.  Abdominal:     General: There is no distension.     Tenderness: There is no abdominal tenderness. There is no guarding or rebound.  Musculoskeletal:        General: Normal range of motion.     Cervical back: Normal range of motion and neck supple.     Comments: Pulse motor and sensation intact in bilateral upper extremities  Skin:    Coloration: Skin is not pale.     Findings: No rash.  Neurological:     Mental Status: He is alert and oriented to person, place, and time.  Psychiatric:        Behavior: Behavior normal.     ED Results / Procedures / Treatments   Labs (all labs ordered are listed, but  only abnormal results are displayed) Labs Reviewed - No data to display  EKG None  Radiology No results found.  Procedures Procedures    Medications Ordered in ED Medications  HYDROmorphone (DILAUDID) injection 1 mg (1 mg Intramuscular Given 08/12/23 1915)  acetaminophen (TYLENOL) tablet 1,000 mg (1,000 mg Oral Given 08/12/23 1914)  ketorolac (TORADOL) 15 MG/ML injection 15 mg (15 mg Intramuscular Given 08/12/23 1915)  diazepam (VALIUM) tablet 5 mg (5 mg Oral Given 08/12/23 1915)  HYDROmorphone (DILAUDID) injection 1 mg (1 mg Intramuscular Given 08/12/23 2008)    ED Course/ Medical Decision Making/ A&P                                 Medical Decision Making Risk OTC drugs. Prescription drug management.   58 yo M with a chief complaints of acute on chronic neck pain.  He tells me this happens to him from time to time.  Gets better on its own and every once while has come to the emergency department for acute treatment.  Usually gets better with medications here.  I reviewed the patient's medical record he has had multiple C-spine surgeries done by neurosurgery in our system.  He has limited range of motion of his neck.  He has a benign neurologic exam otherwise.  Will attempt to control his pain here.  Patient feeling better.  Would like to go home.  PCP follow-up.  9:39 PM:  I have discussed the diagnosis/risks/treatment options with the patient.  Evaluation and diagnostic testing in the emergency department does not suggest an emergent condition requiring admission or immediate intervention beyond what has been performed at this time.  They will follow up with PCP. We also discussed returning to the ED immediately if new or worsening sx occur. We discussed the sx which are most concerning (e.g., sudden worsening pain, fever, inability to tolerate by mouth) that necessitate immediate return. Medications administered to the patient during their visit and any new prescriptions provided  to the patient are listed below.  Medications given during this visit Medications  HYDROmorphone (DILAUDID) injection 1 mg (1 mg Intramuscular Given 08/12/23 1915)  acetaminophen (TYLENOL) tablet 1,000 mg (1,000 mg Oral Given 08/12/23 1914)  ketorolac (TORADOL) 15 MG/ML injection 15 mg (15 mg Intramuscular Given 08/12/23 1915)  diazepam (VALIUM) tablet 5 mg (5 mg Oral Given 08/12/23 1915)  HYDROmorphone (  DILAUDID) injection 1 mg (1 mg Intramuscular Given 08/12/23 2008)     The patient appears reasonably screen and/or stabilized for discharge and I doubt any other medical condition or other [facility] requiring further screening, evaluation, or treatment in the ED at this time prior to discharge.          Final Clinical Impression(s) / ED Diagnoses Final diagnoses:  Neck pain    Rx / DC Orders ED Discharge Orders     None         Melene Plan, DO 08/12/23 2139

## 2023-08-12 NOTE — ED Triage Notes (Addendum)
Pt reports chronic neck pain due to 20 neck sx in the past. Pt reports increase pain left side of neck x 2 days. Feels pain in left jaw. Denies injury Pt has percocet and trazanadine at home but has not relief

## 2023-08-12 NOTE — Discharge Instructions (Signed)
Follow up with your neurosurgeon in the office

## 2023-08-21 ENCOUNTER — Other Ambulatory Visit: Payer: Self-pay | Admitting: Surgery

## 2023-08-21 DIAGNOSIS — M542 Cervicalgia: Secondary | ICD-10-CM

## 2023-09-09 ENCOUNTER — Other Ambulatory Visit (HOSPITAL_COMMUNITY): Payer: Self-pay

## 2023-09-09 DIAGNOSIS — Z981 Arthrodesis status: Secondary | ICD-10-CM

## 2023-09-09 DIAGNOSIS — M542 Cervicalgia: Secondary | ICD-10-CM

## 2023-09-16 ENCOUNTER — Other Ambulatory Visit

## 2023-09-22 ENCOUNTER — Other Ambulatory Visit

## 2023-09-29 ENCOUNTER — Ambulatory Visit

## 2023-09-29 DIAGNOSIS — M542 Cervicalgia: Secondary | ICD-10-CM

## 2023-09-29 DIAGNOSIS — Z981 Arthrodesis status: Secondary | ICD-10-CM

## 2023-09-29 MED ORDER — IOHEXOL 300 MG/ML  SOLN
100.0000 mL | Freq: Once | INTRAMUSCULAR | Status: AC | PRN
  Administered 2023-09-29: 75 mL via INTRAVENOUS

## 2023-10-23 HISTORY — PX: NECK SURGERY: SHX720

## 2023-10-26 ENCOUNTER — Emergency Department (HOSPITAL_BASED_OUTPATIENT_CLINIC_OR_DEPARTMENT_OTHER)
Admission: EM | Admit: 2023-10-26 | Discharge: 2023-10-26 | Disposition: A | Discharge status: Home / Self Care | Attending: Emergency Medicine | Admitting: Emergency Medicine

## 2023-10-26 ENCOUNTER — Emergency Department (HOSPITAL_BASED_OUTPATIENT_CLINIC_OR_DEPARTMENT_OTHER)

## 2023-10-26 ENCOUNTER — Other Ambulatory Visit: Payer: Self-pay

## 2023-10-26 ENCOUNTER — Encounter (HOSPITAL_BASED_OUTPATIENT_CLINIC_OR_DEPARTMENT_OTHER): Payer: Self-pay | Admitting: Emergency Medicine

## 2023-10-26 ENCOUNTER — Emergency Department (HOSPITAL_BASED_OUTPATIENT_CLINIC_OR_DEPARTMENT_OTHER): Admitting: Radiology

## 2023-10-26 DIAGNOSIS — L03221 Cellulitis of neck: Secondary | ICD-10-CM | POA: Insufficient documentation

## 2023-10-26 DIAGNOSIS — R6510 Systemic inflammatory response syndrome (SIRS) of non-infectious origin without acute organ dysfunction: Secondary | ICD-10-CM | POA: Diagnosis not present

## 2023-10-26 DIAGNOSIS — J189 Pneumonia, unspecified organism: Secondary | ICD-10-CM | POA: Diagnosis not present

## 2023-10-26 DIAGNOSIS — L089 Local infection of the skin and subcutaneous tissue, unspecified: Secondary | ICD-10-CM

## 2023-10-26 DIAGNOSIS — R112 Nausea with vomiting, unspecified: Secondary | ICD-10-CM | POA: Diagnosis present

## 2023-10-26 DIAGNOSIS — Z7982 Long term (current) use of aspirin: Secondary | ICD-10-CM | POA: Insufficient documentation

## 2023-10-26 LAB — CBC WITH DIFFERENTIAL/PLATELET
Abs Immature Granulocytes: 0.03 10*3/uL (ref 0.00–0.07)
Basophils Absolute: 0 10*3/uL (ref 0.0–0.1)
Basophils Relative: 0 %
Eosinophils Absolute: 0 10*3/uL (ref 0.0–0.5)
Eosinophils Relative: 0 %
HCT: 39.5 % (ref 39.0–52.0)
Hemoglobin: 13.4 g/dL (ref 13.0–17.0)
Immature Granulocytes: 0 %
Lymphocytes Relative: 8 %
Lymphs Abs: 0.7 10*3/uL (ref 0.7–4.0)
MCH: 30 pg (ref 26.0–34.0)
MCHC: 33.9 g/dL (ref 30.0–36.0)
MCV: 88.6 fL (ref 80.0–100.0)
Monocytes Absolute: 0.8 10*3/uL (ref 0.1–1.0)
Monocytes Relative: 9 %
Neutro Abs: 7.2 10*3/uL (ref 1.7–7.7)
Neutrophils Relative %: 83 %
Platelets: 237 10*3/uL (ref 150–400)
RBC: 4.46 MIL/uL (ref 4.22–5.81)
RDW: 12.8 % (ref 11.5–15.5)
WBC: 8.8 10*3/uL (ref 4.0–10.5)
nRBC: 0 % (ref 0.0–0.2)

## 2023-10-26 LAB — COMPREHENSIVE METABOLIC PANEL WITH GFR
ALT: 81 U/L — ABNORMAL HIGH (ref 0–44)
AST: 34 U/L (ref 15–41)
Albumin: 4.4 g/dL (ref 3.5–5.0)
Alkaline Phosphatase: 214 U/L — ABNORMAL HIGH (ref 38–126)
Anion gap: 12 (ref 5–15)
BUN: 23 mg/dL — ABNORMAL HIGH (ref 6–20)
CO2: 29 mmol/L (ref 22–32)
Calcium: 10.2 mg/dL (ref 8.9–10.3)
Chloride: 96 mmol/L — ABNORMAL LOW (ref 98–111)
Creatinine, Ser: 1.25 mg/dL — ABNORMAL HIGH (ref 0.61–1.24)
GFR, Estimated: >60 mL/min (ref >60)
Glucose, Bld: 96 mg/dL (ref 70–99)
Potassium: 4.7 mmol/L (ref 3.5–5.1)
Sodium: 137 mmol/L (ref 135–145)
Total Bilirubin: 0.6 mg/dL (ref 0.0–1.2)
Total Protein: 6.6 g/dL (ref 6.5–8.1)

## 2023-10-26 LAB — LACTIC ACID, PLASMA: Lactic Acid, Venous: 0.8 mmol/L (ref 0.5–1.9)

## 2023-10-26 LAB — RESP PANEL BY RT-PCR (RSV, FLU A&B, COVID)  RVPGX2
Influenza A by PCR: NEGATIVE
Influenza B by PCR: NEGATIVE
Resp Syncytial Virus by PCR: NEGATIVE
SARS Coronavirus 2 by RT PCR: NEGATIVE

## 2023-10-26 MED ORDER — HYDROMORPHONE HCL 1 MG/ML IJ SOLN
1.0000 mg | Freq: Once | INTRAMUSCULAR | Status: AC
  Administered 2023-10-26: 1 mg via INTRAVENOUS
  Filled 2023-10-26: qty 1

## 2023-10-26 MED ORDER — PROMETHAZINE HCL 25 MG RE SUPP: 25.0000 mg | Freq: Four times a day (QID) | RECTAL | 0 refills | Status: AC | PRN

## 2023-10-26 MED ORDER — IOHEXOL 300 MG/ML  SOLN
75.0000 mL | Freq: Once | INTRAMUSCULAR | Status: AC | PRN
  Administered 2023-10-26: 75 mL via INTRAVENOUS

## 2023-10-26 MED ORDER — VANCOMYCIN HCL IN DEXTROSE 1-5 GM/200ML-% IV SOLN: 1000.0000 mg | Freq: Once | INTRAVENOUS | Status: DC

## 2023-10-26 MED ORDER — VANCOMYCIN HCL IN DEXTROSE 1-5 GM/200ML-% IV SOLN
1000.0000 mg | Freq: Once | INTRAVENOUS | Status: AC
  Administered 2023-10-26: 1000 mg via INTRAVENOUS
  Filled 2023-10-26: qty 200

## 2023-10-26 MED ORDER — VANCOMYCIN HCL 500 MG IV SOLR
500.0000 mg | Freq: Once | INTRAVENOUS | Status: AC
  Administered 2023-10-26: 500 mg via INTRAVENOUS

## 2023-10-26 MED ORDER — METOCLOPRAMIDE HCL 5 MG/ML IJ SOLN
10.0000 mg | Freq: Once | INTRAMUSCULAR | Status: AC
  Administered 2023-10-26: 10 mg via INTRAVENOUS
  Filled 2023-10-26: qty 2

## 2023-10-26 MED ORDER — PROMETHAZINE HCL 25 MG/ML IJ SOLN
INTRAMUSCULAR | Status: DC
Start: 2023-10-26 — End: 2023-10-27
  Filled 2023-10-26: qty 1

## 2023-10-26 MED ORDER — METRONIDAZOLE 500 MG PO TABS: 500.0000 mg | ORAL_TABLET | Freq: Two times a day (BID) | ORAL | 0 refills | Status: DC

## 2023-10-26 MED ORDER — MORPHINE SULFATE (PF) 4 MG/ML IV SOLN
4.0000 mg | Freq: Once | INTRAVENOUS | Status: AC
  Administered 2023-10-26: 4 mg via INTRAVENOUS
  Filled 2023-10-26: qty 1

## 2023-10-26 MED ORDER — SODIUM CHLORIDE 0.9 % IV SOLN
2.0000 g | Freq: Once | INTRAVENOUS | Status: AC
  Administered 2023-10-26: 2 g via INTRAVENOUS
  Filled 2023-10-26: qty 20

## 2023-10-26 MED ORDER — PANTOPRAZOLE SODIUM 40 MG IV SOLR
40.0000 mg | Freq: Once | INTRAVENOUS | Status: AC
  Administered 2023-10-26: 40 mg via INTRAVENOUS
  Filled 2023-10-26: qty 10

## 2023-10-26 MED ORDER — AMOXICILLIN-POT CLAVULANATE 875-125 MG PO TABS: 1.0000 | ORAL_TABLET | Freq: Two times a day (BID) | ORAL | 0 refills | Status: DC

## 2023-10-26 MED ORDER — SODIUM CHLORIDE 0.9 % IV SOLN
12.5000 mg | Freq: Once | INTRAVENOUS | Status: AC
  Administered 2023-10-26: 12.5 mg via INTRAVENOUS
  Filled 2023-10-26: qty 0.5

## 2023-10-26 MED ORDER — PROMETHAZINE HCL 25 MG/ML IJ SOLN
INTRAMUSCULAR | Status: AC
  Filled 2023-10-26: qty 1

## 2023-10-26 MED ORDER — DIPHENHYDRAMINE HCL 50 MG/ML IJ SOLN
25.0000 mg | Freq: Once | INTRAMUSCULAR | Status: AC
  Administered 2023-10-26: 25 mg via INTRAVENOUS
  Filled 2023-10-26: qty 1

## 2023-10-26 MED ORDER — SODIUM CHLORIDE 0.9 % IV BOLUS
1000.0000 mL | Freq: Once | INTRAVENOUS | Status: AC
  Administered 2023-10-26: 1000 mL via INTRAVENOUS

## 2023-10-26 NOTE — Discharge Instructions (Addendum)
 It is possible that you have aspiration pneumonia, I have added another antibiotic to take. Please consult your ID doctor. Return to the ER if you feel like your symptoms are worsening, or you have intractable nausea, vomiting.  Continue your IV antibiotics, as instructed.

## 2023-10-26 NOTE — ED Triage Notes (Signed)
 Pt had neck surgery last week, MD found infection in prior hardware, put pt on antibiotics and PICC line, dc home Friday. Was feeling some nausea but after 1st home dose of iv antibiotic yesterday he has been vomiting now as well. No fevers

## 2023-10-26 NOTE — ED Provider Notes (Signed)
 [place] [facility] AT [facility] Provider Note   CSN: [id number] Arrival date & time: 10/26/23  1327     History {Add pertinent medical, surgical, social history, OB history to HPI:1} Chief Complaint  Patient presents with   Emesis    Richard Franco is a 58 y.o. male, history infected cervical fine hardware, who presents to the ED secondary to feeling unwell for the last couple days.  He states that he was recently discharged, 3 days ago, from the hospital, after having infected hardware removed from his spine.  He notes that that he has been on antibiotics, including daptomycin, and ceftriaxone  at home, but is having severe nausea, vomiting, thus he decided come to the ER.  He states he reached out to his ID team, at [facility], and they told him to go to the ER, for further evaluation.  He states that he feels like he has been having fevers, but is not sure.  Reports profuse sweating, and just feeling unwell.  Unable to keep his pain meds down.  Reports reduced motion of his neck, at baseline, but states he had severe headache now.     Home Medications Prior to Admission medications   Medication Sig Start Date End Date Taking? Authorizing Provider  aspirin  81 MG EC tablet Take 1 tablet (81 mg total) by mouth daily. Restart on 12/13/20 12/08/20   Cannon Champion, MD  cyclobenzaprine  (FLEXERIL ) 10 MG tablet Take 1 tablet (10 mg total) by mouth 3 (three) times daily as needed for muscle spasms. 12/07/22   Agustina Aldrich, MD  EPINEPHrine  0.3 mg/0.3 mL IJ SOAJ injection Inject 0.3 mg into the muscle once as needed for anaphylaxis. 07/14/19   [provider]  esomeprazole  (NEXIUM ) 40 MG capsule Take 1 capsule (40 mg total) by mouth 2 (two) times daily before a meal. 06/13/23   Lemmon, Kathy Parker, PA  FLUoxetine  (PROZAC ) 40 MG capsule Take 40 mg by mouth at bedtime.    [provider]  lamoTRIgine  (LAMICTAL ) 200 MG tablet Take 400 mg by mouth at  bedtime.    [provider]  nitroGLYCERIN  (NITROSTAT ) 0.4 MG SL tablet Place 1 tablet (0.4 mg total) under the tongue every 5 (five) minutes as needed for chest pain. 01/16/22   Robinson, John K, PA-C  ondansetron  (ZOFRAN -ODT) 4 MG disintegrating tablet Take 1 tablet (4 mg total) by mouth every 8 (eight) hours as needed. 12/22/22   Curatolo, Adam, DO  oxyCODONE -acetaminophen  (PERCOCET) 10-325 MG tablet Take 1 tablet by mouth every 4 (four) hours as needed for pain. 12/07/22   Agustina Aldrich, MD  QUEtiapine  (SEROQUEL ) 50 MG tablet Take 50 mg by mouth at bedtime as needed (for sleep). 04/16/20   [provider]  triamcinolone  cream (KENALOG ) 0.1 % Apply 1 Application topically 2 (two) times daily. Do not apply to face, body folds or genital area. Do not apply for > 14 days 12/24/22   Neil Balls A, PA      Allergies    Midodrine hcl, Other, Sulfa antibiotics, and Doxycycline     Review of Systems   Review of Systems  Constitutional:  Positive for chills.  Cardiovascular:  Negative for chest pain.  Gastrointestinal:  Positive for vomiting.    Physical Exam Updated Vital Signs BP (!) 121/92 (BP Location: Right Arm)   Pulse (!) 119   Temp 98.1 F (36.7 C)   Resp 20   Ht 5\' 8"  (1.727 m)   Wt 79.4 kg  SpO2 98%   BMI 26.61 kg/m  Physical Exam Vitals and nursing note reviewed.  Constitutional:      Appearance: He is well-developed. He is diaphoretic.  HENT:     Head: Normocephalic and atraumatic.  Eyes:     Conjunctiva/sclera: Conjunctivae normal.  Cardiovascular:     Rate and Rhythm: Normal rate and regular rhythm.     Heart sounds: No murmur heard. Pulmonary:     Effort: Pulmonary effort is normal. No respiratory distress.     Breath sounds: Normal breath sounds.  Abdominal:     Palpations: Abdomen is soft.     Tenderness: There is no abdominal tenderness.  Musculoskeletal:        General: No swelling.     Cervical back: Neck supple.     Comments: Limited ROM  of neck (per patient this is baseline for him). No overlying erythema of spine or fluctuance.   Skin:    General: Skin is warm.     Capillary Refill: Capillary refill takes less than 2 seconds.  Neurological:     Mental Status: He is alert.  Psychiatric:        Mood and Affect: Mood normal.     ED Results / Procedures / Treatments   Labs (all labs ordered are listed, but only abnormal results are displayed) Labs Reviewed - No data to display  EKG None  Radiology No results found.  Procedures Procedures  {Document cardiac monitor, telemetry assessment procedure when appropriate:1}  Medications Ordered in ED Medications - No data to display  ED Course/ Medical Decision Making/ A&P   {   Click here for ABCD2, HEART and other calculatorsREFRESH Note before signing :1}                              Medical Decision Making Amount and/or Complexity of Data Reviewed Labs: ordered. Radiology: ordered.  Risk Prescription drug management.   ***  {Document critical care time when appropriate:1} {Document review of labs and clinical decision tools ie heart score, Chads2Vasc2 etc:1}  {Document your independent review of radiology images, and any outside records:1} {Document your discussion with family members, caretakers, and with consultants:1} {Document social determinants of health affecting pt's care:1} {Document your decision making why or why not admission, treatments were needed:1} Final Clinical Impression(s) / ED Diagnoses Final diagnoses:  None    Rx / DC Orders ED Discharge Orders     None

## 2023-10-31 LAB — CULTURE, BLOOD (ROUTINE X 2)
Culture: NO GROWTH
Culture: NO GROWTH
Special Requests: ADEQUATE
Special Requests: ADEQUATE

## 2024-01-09 ENCOUNTER — Encounter: Payer: Self-pay | Admitting: Advanced Practice Midwife

## 2024-02-03 ENCOUNTER — Ambulatory Visit: Admitting: Adult Health

## 2024-02-16 ENCOUNTER — Encounter (HOSPITAL_BASED_OUTPATIENT_CLINIC_OR_DEPARTMENT_OTHER): Payer: Self-pay | Admitting: Pulmonary Disease

## 2024-02-16 ENCOUNTER — Ambulatory Visit (INDEPENDENT_AMBULATORY_CARE_PROVIDER_SITE_OTHER): Admitting: Pulmonary Disease

## 2024-02-16 VITALS — BP 123/79 | HR 94 | Ht 68.0 in | Wt 179.6 lb

## 2024-02-16 DIAGNOSIS — F17211 Nicotine dependence, cigarettes, in remission: Secondary | ICD-10-CM

## 2024-02-16 DIAGNOSIS — R9389 Abnormal findings on diagnostic imaging of other specified body structures: Secondary | ICD-10-CM | POA: Diagnosis not present

## 2024-02-16 NOTE — Patient Instructions (Signed)
-   Please get your CT chest completed as soon as you could - I will give you a call with next steps after I get the result.

## 2024-02-16 NOTE — Progress Notes (Signed)
 New Patient Pulmonology Office Visit   Subjective:  Patient ID: BINNIE VONDERHAAR, male    DOB: 24-Feb-1966  MRN: [id number]  Referred by: Teresa Aldona CROME, NP  CC:  Chief Complaint  Patient presents with   Establish Care    Abnormal CT - Ground Glass    HPI FARREL GUIMOND is a 58 y.o. male with a PMH significant for pericarditis (unknown etiology), abnormal chest imaging (GGOs in upper lobes, thought to previously be COP, steroid responsive), cervical spine spinal epidural abscess now s/p removal of posterior instrumentation bilateral cervical, irrigation and debridement who presents for evaluation and management of abnormal chest imaging.  Underwent his cervical spine surgery on 10/24/2023 and then presented to ED on 10/26/23 with malaise, nausea, vomiting and inability to tolerate PO intake. At the time underwent cT cervical spine that showed bilateral upper lobe GGOs as well as mild emphysematous changes. The patient was treated with IV antimicrobials including vancomcyin, ceftraixone and then discharged on Augmentin  and flagyl .  The patients note that he had ground glass previously with Dr. Tonna. This happened since 2013 and he was treated with steroids for 6 months for a prolonged taper. He also underwent bronchoscopy with results documented below:  05/06/12 Bronchoscopy with BAL, cell count and TBBx of the lingula  BAL cell count with elevated macrophages (possible COP, Hypersensitivity pneumonitis vs chronic microaspiration, possible Aluminum exposure)  RVP neg Viral culture neg  AFB smear neg, culture negative   Cytology/pathology benign with no evidence of aspiration or ILD   Today, he notes that he has some shortness of breath. Lately has a low grade cough. No phlegm. No hemoptysis. Some chest tightness on exertion and when he takes a deep breath. He had pleurisy before but not currently. No fevers, chills, or night sweats. Sometimes hot flashes. No weight changes.No  palpitations or leg swelling. Previously went on prednisone  for 6 months. 40 mg at once and then slowly came down. Currently finished the antibiotics around June time.  PMH:  - as above  PSH:  - Neck surgery  Medications: reviewed in chart  Allergies: sulfa, doxycycline   Social Hx: - Smoking: quit smoking 6 years ago, 2 packs x 33 years = 66 PY  - Second hand smoking none  - Vaping: none  - Alcohol : none  - Illicit substances: none  - Indoor emission from household combustion: none  - Hobbies (e.g. wood work, Surveyor, quantity, bird breeding etc...): build houses for a living, Soil scientist (e.g. mold): none  - Pets: 2 dogs  - Birds: none   Occupational exposures: machine shop sold in 2011, now Surveyor, minerals [ ]  Asbestos - Holiday representative workers, Medical sales representative, Therapist, sports, railroad, Health visitor radiation - uranium mining  [ ]  Vinyl chloride - pulp & paper workers  [ ]  Arsenic - smelting of ores, Chief Executive Officer, wood preservation  [ ]  Beryllium - Archivist workers, Geophysical data processor, Pensions consultant workers, jewelers  [X]  chromium - Financial trader, welding, tanning industries  [X]  Nickel - Chief Strategy Officer, Naval architect, Physiological scientist, glass workers, Health and safety inspector, metal workers   Family Hx:  - Lung disease: sister with asthma  - Cancer: none   ROS  Allergies: Midodrine hcl, Other, Sulfa antibiotics, and Doxycycline   Current Outpatient Medications:    aspirin  81 MG EC tablet, Take 1 tablet (81 mg total) by mouth daily. Restart on 12/13/20, Disp: 30 tablet, Rfl: 12   EPINEPHrine  0.3 mg/0.3 mL IJ SOAJ injection, Inject 0.3 mg into the  muscle once as needed for anaphylaxis., Disp: , Rfl:    esomeprazole  (NEXIUM ) 40 MG capsule, Take 1 capsule (40 mg total) by mouth 2 (two) times daily before a meal., Disp: 60 capsule, Rfl: 0   FLUoxetine  (PROZAC ) 40 MG capsule, Take 40 mg by mouth at bedtime., Disp: , Rfl:    lamoTRIgine  (LAMICTAL ) 200 MG  tablet, Take 400 mg by mouth at bedtime., Disp: , Rfl:    nitroGLYCERIN  (NITROSTAT ) 0.4 MG SL tablet, Place 1 tablet (0.4 mg total) under the tongue every 5 (five) minutes as needed for chest pain., Disp: 30 tablet, Rfl: 0   ondansetron  (ZOFRAN -ODT) 4 MG disintegrating tablet, Take 1 tablet (4 mg total) by mouth every 8 (eight) hours as needed., Disp: 20 tablet, Rfl: 0   oxyCODONE -acetaminophen  (PERCOCET) 10-325 MG tablet, Take 1 tablet by mouth every 4 (four) hours as needed for pain., Disp: 40 tablet, Rfl: 0   promethazine  (PHENERGAN ) 25 MG suppository, Place 1 suppository (25 mg total) rectally every 6 (six) hours as needed for nausea or vomiting., Disp: 12 each, Rfl: 0   QUEtiapine  (SEROQUEL ) 50 MG tablet, Take 50 mg by mouth at bedtime as needed (for sleep)., Disp: , Rfl:    amoxicillin -clavulanate (AUGMENTIN ) 875-125 MG tablet, Take 1 tablet by mouth every 12 (twelve) hours. (Patient not taking: Reported on 02/16/2024), Disp: 14 tablet, Rfl: 0   cyclobenzaprine  (FLEXERIL ) 10 MG tablet, Take 1 tablet (10 mg total) by mouth 3 (three) times daily as needed for muscle spasms. (Patient not taking: Reported on 02/16/2024), Disp: 30 tablet, Rfl: 0   triamcinolone  cream (KENALOG ) 0.1 %, Apply 1 Application topically 2 (two) times daily. Do not apply to face, body folds or genital area. Do not apply for > 14 days (Patient not taking: Reported on 02/16/2024), Disp: 75 g, Rfl: 0 Past Medical History:  Diagnosis Date   Abnormal chest CT 03/2012   a. 2013: Scattered patchy ground glass opacities and pulmonary nodules. Transbronchial biopsy with no definite etiology of lung finding- possible organizing pneumonia. b. F/u CT 05/2014: stable multiple tiny pulm nodules, no further w/u recommended per notes.    Anxiety    Arthritis    CAD (coronary artery disease)    CHF (congestive heart failure) (HCC)    Chronic midline posterior neck pain    Chronic pain disorder    Complication of anesthesia     difficult  to urinate after    Depression    Elevated LFTs    Esophageal stricture    Family history of adverse reaction to anesthesia    mom had PONV   Fracture    back   GERD (gastroesophageal reflux disease)    History of kidney stones    Hypercholesteremia    Hypertension    Iron deficiency    Migraine headache    none lately; I've had alot in my life (06/21/2015)   NSTEMI (non-ST elevated myocardial infarction) (HCC) 07/2012   a. Normal cath 03/2012. b. 07/2012: troponin 4, normal cors, ?vasospasm. Did not tolerate Imdur  due to headache. On amlodipine .   Opacities of both lungs present on chest x-ray    Testosterone  deficiency    Tobacco abuse    Past Surgical History:  Procedure Laterality Date   ANKLE FUSION Left 2006   CERVICAL FUSION  X9   COLONOSCOPY     CYSTECTOMY     FRACTURE SURGERY     pt denies   LEFT HEART CATHETERIZATION WITH CORONARY ANGIOGRAM N/A 03/25/2012  Procedure: LEFT HEART CATHETERIZATION WITH CORONARY ANGIOGRAM;  Surgeon: Lonni JONETTA Cash, MD;  Location[facility] CATH LAB;  Service: Cardiovascular;  Laterality: N/A;   LEFT HEART CATHETERIZATION WITH CORONARY ANGIOGRAM N/A 08/11/2012   Procedure: LEFT HEART CATHETERIZATION WITH CORONARY ANGIOGRAM;  Surgeon: Peter M Swaziland, MD;  Location: [facility] CATH LAB;  Service: Cardiovascular;  Laterality: N/A;   NECK SURGERY  10/2023   POSTERIOR CERVICAL FUSION/FORAMINOTOMY  05/09/2011   Procedure: POSTERIOR CERVICAL FUSION/FORAMINOTOMY LEVEL 1;  Surgeon: Lamar LELON Peaches;  Location: [facility] NEURO ORS;  Service: Neurosurgery;  Laterality: N/A;  C4/5 posterior arthrodesis with instrumentation    POSTERIOR CERVICAL FUSION/FORAMINOTOMY N/A 12/06/2020   Procedure: Cervical two Ganglionectomy with extension of fusion from Cervical one to Cervical five;  Surgeon: Cheryle Debby LABOR, MD;  Location: [facility] OR;  Service: Neurosurgery;  Laterality: N/A;   POSTERIOR CERVICAL FUSION/FORAMINOTOMY N/A 12/06/2022   Procedure: Revision of Cervical  One-Two Posterior Cervical Fusion;  Surgeon: Cheryle Debby LABOR, MD;  Location: [facility] OR;  Service: Neurosurgery;  Laterality: N/A;  RM 20 to follow   UPPER GASTROINTESTINAL ENDOSCOPY     VIDEO BRONCHOSCOPY  05/06/2012   Procedure: VIDEO BRONCHOSCOPY WITH FLUORO;  Surgeon: Lonell SHAUNNA Read, MD;  Location: [facility] ENDOSCOPY;  Service: Cardiopulmonary;  Laterality: Bilateral;   Family History  Problem Relation Age of Onset   Diabetes Mother    Cancer Mother        ?   Hypertension Mother    Hyperlipidemia Father    Diabetes Father    Coronary artery disease Paternal Uncle    Asthma Sister    Diabetes Brother    Hypertension Brother    Colon cancer Neg Hx    Colon polyps Neg Hx    Rectal cancer Neg Hx    Stomach cancer Neg Hx    Social History   Socioeconomic History   Marital status: Divorced    Spouse name: Not on file   Number of children: 1   Years of education: Not on file   Highest education level: Not on file  Occupational History   Occupation: Surveyor, minerals  Tobacco Use   Smoking status: Former    Current packs/day: 0.00    Average packs/day: 1 pack/day for 30.0 years (30.0 ttl pk-yrs)    Types: Cigarettes    Start date: 12/17/1987    Quit date: 12/16/2017    Years since quitting: 6.1   Smokeless tobacco: Former    Types: Chew    Quit date: 2014  Vaping Use   Vaping status: Never Used  Substance and Sexual Activity   Alcohol  use: Not Currently   Drug use: No   Sexual activity: Yes  Other Topics Concern   Not on file  Social History Narrative   Not on file   Social Drivers of Health   Financial Resource Strain: Low Risk  (12/11/2023)   Received from [facility]   Overall Financial Resource Strain (CARDIA)    Difficulty of Paying Living Expenses: Not very hard  Food Insecurity: No Food Insecurity (01/01/2024)   Received from [facility]   Hunger Vital Sign    Within the past 12 months, you worried that your food would run out before you got the money to buy  more.: Never true    Within the past 12 months, the food you bought just didn't last and you didn't have money to get more.: Never true  Transportation Needs: No Transportation Needs (01/01/2024)   Received from [facility]  PRAPARE - Administrator, Civil Service (Medical): No    Lack of Transportation (Non-Medical): No  Physical Activity: Unknown (12/11/2023)   Received from [facility]   Exercise Vital Sign    On average, how many days per week do you engage in moderate to strenuous exercise (like a brisk walk)?: 0 days    Minutes of Exercise per Session: Not on file  Stress: No Stress Concern Present (12/11/2023)   Received from [facility] - Occupational Stress Questionnaire    Feeling of Stress : Not at all  Social Connections: Socially Integrated (12/11/2023)   Received from [facility]   Social Network    How would you rate your social network (family, work, friends)?: Good participation with social networks  Intimate Partner Violence: Not At Risk (12/11/2023)   Received from [facility]   HITS    Over the last 12 months how often did your partner physically hurt you?: Never    Over the last 12 months how often did your partner insult you or talk down to you?: Never    Over the last 12 months how often did your partner threaten you with physical harm?: Never    Over the last 12 months how often did your partner scream or curse at you?: Never       Objective:  BP 123/79   Pulse 94   Ht 5' 8 (1.727 m)   Wt 179 lb 9.6 oz (81.5 kg)   SpO2 96%   BMI 27.31 kg/m  Wt Readings from Last 3 Encounters:  02/16/24 179 lb 9.6 oz (81.5 kg)  10/26/23 175 lb (79.4 kg)  08/12/23 161 lb (73 kg)   BMI Readings from Last 3 Encounters:  02/16/24 27.31 kg/m  10/26/23 26.61 kg/m  08/12/23 24.48 kg/m   SpO2 Readings from Last 3 Encounters:  02/16/24 96%  10/26/23 94%  08/12/23 99%    Physical Exam General: NAD,  alert, WD, WN Eyes: PERRL, no scleral icterus ENMT: oropharynx clear, good dentition, no oral lesions, mallampati score I Skin: warm, intact, no rashes Neck: JVD flat, ROM and lymph node assessment normal CV: RRR, no MRG, nl S1 and S2, no peripheral edema Resp: clear to auscultation bilaterally, no wheezes, rales, or rhonchi, normal effort, no clubbing/cyanosis Abdom: Normoactive bowel sounds, soft, nontender, nondistended, no hepatosplenomegaly Ext: edema Neuro: Awake alert oriented to person place time and situation  Diagnostic Review:  Last CBC Lab Results  Component Value Date   WBC 8.8 10/26/2023   HGB 13.4 10/26/2023   HCT 39.5 10/26/2023   MCV 88.6 10/26/2023   MCH 30.0 10/26/2023   RDW 12.8 10/26/2023   PLT 237 10/26/2023   Last metabolic panel Lab Results  Component Value Date   GLUCOSE 96 10/26/2023   NA 137 10/26/2023   K 4.7 10/26/2023   CL 96 (L) 10/26/2023   CO2 29 10/26/2023   BUN 23 (H) 10/26/2023   CREATININE 1.25 (H) 10/26/2023   GFRNONAA >60 10/26/2023   CALCIUM  10.2 10/26/2023   PROT 6.6 10/26/2023   ALBUMIN  4.4 10/26/2023   LABGLOB 2.1 09/18/2015   AGRATIO 2.0 09/18/2015   BILITOT 0.6 10/26/2023   ALKPHOS 214 (H) 10/26/2023   AST 34 10/26/2023   ALT 81 (H) 10/26/2023   ANIONGAP 12 10/26/2023   Blood cultures on 10/26/23 --> NGTD  I reviewed CT soft tissue neck with contrast completed on 10/26/2023 which shows mild emphysematous changes as  well as ground glass opacities in upper lobes. CXR completed the same day shows a RLL infiltrate versus band of atelectasis.    PFTs 2019 normal  Labs 2013: IgG subclasses negative A1AT 125  Immunoglobulin panel normal RF negative SPEP/UPEP without M spike ACE level normal IgE 1.6 ANCA panel negative ANA negative CK level normal  Assessment & Plan:   Assessment & Plan Abnormal CT of the chest The patient was incidentally noted to have bilateral upper lobe ground glass opacities on CT neck ordered in  10/2023 for evaluation of SEA at the cervical spine. The patient has a prolonged hx of steroid responsive GGOs in upper lobes which has completely resolved in past. Extensive autoimmune work up and bronchoscopic biopsies did not yield a diagnosis at the time and ruled out malignant, autoimmune, and infectious etiologies for findings. 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 He also has a personal hx of smoking but has quit 6 years ago. He has no hx of VAPING. For now, I told him to get a high resolution CT chest to evaluate for resolution of upper lobe GGOs. If they have not resolved, will likely repeat autoimmune work up for ILD and obtain tissue biopsy and BAL with bronchoscopy to elucidate etiology. The patient is in agreement. Cigarette nicotine  dependence in remission Quit 6 years ago.  Orders Placed This Encounter  Procedures   CT CHEST HIGH RESOLUTION   I spent 66 minutes reviewing patient's chart including prior consultant notes, imaging, and PFTs as well as face-to-face with the patient, over half in discussion of the diagnosis and the importance of compliance with the treatment plan.  Return in about 1 month (around 03/18/2024).   Sophee Mckimmy, MD

## 2024-02-25 ENCOUNTER — Ambulatory Visit (HOSPITAL_BASED_OUTPATIENT_CLINIC_OR_DEPARTMENT_OTHER)
Admission: RE | Admit: 2024-02-25 | Discharge: 2024-02-25 | Disposition: A | Discharge status: Home / Self Care | Source: Ambulatory Visit | Attending: Pulmonary Disease | Admitting: Pulmonary Disease

## 2024-02-25 DIAGNOSIS — R9389 Abnormal findings on diagnostic imaging of other specified body structures: Secondary | ICD-10-CM | POA: Insufficient documentation

## 2024-03-01 ENCOUNTER — Ambulatory Visit (HOSPITAL_BASED_OUTPATIENT_CLINIC_OR_DEPARTMENT_OTHER): Payer: Self-pay | Admitting: Pulmonary Disease

## 2024-03-01 DIAGNOSIS — J432 Centrilobular emphysema: Secondary | ICD-10-CM

## 2024-03-01 MED ORDER — UMECLIDINIUM-VILANTEROL 62.5-25 MCG/ACT IN AEPB
1.0000 | INHALATION_SPRAY | Freq: Every day | RESPIRATORY_TRACT | 6 refills | Status: AC
Start: 2024-03-01 — End: ?

## 2024-03-01 MED ORDER — ALBUTEROL SULFATE HFA 108 (90 BASE) MCG/ACT IN AERS: 2.0000 | INHALATION_SPRAY | Freq: Four times a day (QID) | RESPIRATORY_TRACT | 2 refills | Status: DC | PRN

## 2024-03-01 NOTE — Telephone Encounter (Signed)
**Note De-identified  [place] Obfuscation** Please advise 

## 2024-03-01 NOTE — Telephone Encounter (Signed)
 Any ideas on help with SOB

## 2024-03-03 ENCOUNTER — Ambulatory Visit: Admitting: Pulmonary Disease

## 2024-03-22 ENCOUNTER — Ambulatory Visit: Admitting: Pulmonary Disease

## 2024-03-30 ENCOUNTER — Ambulatory Visit (HOSPITAL_BASED_OUTPATIENT_CLINIC_OR_DEPARTMENT_OTHER): Admitting: Pulmonary Disease

## 2024-04-19 ENCOUNTER — Encounter (HOSPITAL_BASED_OUTPATIENT_CLINIC_OR_DEPARTMENT_OTHER)

## 2024-04-21 NOTE — Progress Notes (Deleted)
 Richard Console, PA-C [address] [place], [place]  [address] Phone: [phone]   Primary Care Physician: Teresa Aldona CROME, NP  Primary Gastroenterologist:  Richard Console, PA-C / Dr. Gordy Starch   Chief Complaint: Follow-up chronic nausea, vomiting, abdominal pain       HPI:   Discussed the use of AI scribe software for clinical note transcription with the patient, who gave verbal consent to proceed.  History of GERD and takes Nexium  40 mg twice daily.  Zofran  4 mg as needed nausea.  History of Present Illness   11/2021 colonoscopy by Dr. Starch (to evaluate rectal bleeding): Small internal hemorrhoids, otherwise normal.  No polyps.  10-year repeat.  11/2021 EGD by Dr. Starch (for epigastric pain, GERD, N/V): 2 cm hiatal hernia, otherwise normal esophagus, stomach, and duodenum.  Biopsies negative for H. pylori and celiac.  02/18/2012 colonoscopy normal.  Repeat recommended in 10 years.  03/11/2016 EGD for nausea and vomiting and persistent upper abdominal pain was normal other than small hiatal hernia.  Patient given Levsin  and Zofran .  Also started on probiotic.  PMH: GERD, chronic nausea and vomiting, pulmonary nodule, hypertension, neuralgia, osteoarthritis, history of substance use disorders (cocaine, alcohol , tobacco).  11/2021 last CT abdomen pelvis with contrast: 1. Question diffuse stomach bowel wall thickening. This can be seen in gastritis. 2. Moderate to extensive bowel content is identified throughout colon. This can be seen in constipation. 3. Nonobstructing stones in bilateral kidneys.  Current Outpatient Medications  Medication Sig Dispense Refill   albuterol  (VENTOLIN  HFA) 108 (90 Base) MCG/ACT inhaler Inhale 2 puffs into the lungs every 6 (six) hours as needed for wheezing or shortness of breath. 8.5 g 2   amoxicillin -clavulanate (AUGMENTIN ) 875-125 MG tablet Take 1 tablet by mouth every 12 (twelve) hours. (Patient not taking: Reported on 02/16/2024) 14  tablet 0   aspirin  81 MG EC tablet Take 1 tablet (81 mg total) by mouth daily. Restart on 12/13/20 30 tablet 12   cyclobenzaprine  (FLEXERIL ) 10 MG tablet Take 1 tablet (10 mg total) by mouth 3 (three) times daily as needed for muscle spasms. (Patient not taking: Reported on 02/16/2024) 30 tablet 0   EPINEPHrine  0.3 mg/0.3 mL IJ SOAJ injection Inject 0.3 mg into the muscle once as needed for anaphylaxis.     esomeprazole  (NEXIUM ) 40 MG capsule Take 1 capsule (40 mg total) by mouth 2 (two) times daily before a meal. 60 capsule 0   FLUoxetine  (PROZAC ) 40 MG capsule Take 40 mg by mouth at bedtime.     lamoTRIgine  (LAMICTAL ) 200 MG tablet Take 400 mg by mouth at bedtime.     nitroGLYCERIN  (NITROSTAT ) 0.4 MG SL tablet Place 1 tablet (0.4 mg total) under the tongue every 5 (five) minutes as needed for chest pain. 30 tablet 0   ondansetron  (ZOFRAN -ODT) 4 MG disintegrating tablet Take 1 tablet (4 mg total) by mouth every 8 (eight) hours as needed. 20 tablet 0   oxyCODONE -acetaminophen  (PERCOCET) 10-325 MG tablet Take 1 tablet by mouth every 4 (four) hours as needed for pain. 40 tablet 0   promethazine  (PHENERGAN ) 25 MG suppository Place 1 suppository (25 mg total) rectally every 6 (six) hours as needed for nausea or vomiting. 12 each 0   QUEtiapine  (SEROQUEL ) 50 MG tablet Take 50 mg by mouth at bedtime as needed (for sleep).     triamcinolone  cream (KENALOG ) 0.1 % Apply 1 Application topically 2 (two) times daily. Do not apply to face, body folds  or genital area. Do not apply for > 14 days (Patient not taking: Reported on 02/16/2024) 75 g 0   umeclidinium-vilanterol (ANORO ELLIPTA ) 62.5-25 MCG/ACT AEPB Inhale 1 puff into the lungs daily. 1 each 6   No current facility-administered medications for this visit.    Allergies as of 04/22/2024 - Review Complete 02/16/2024  Allergen Reaction Noted   Midodrine hcl Swelling and Other (See Comments)    Other Anaphylaxis and Other (See Comments) 09/16/2021   Sulfa  antibiotics Rash and Other (See Comments) 02/26/2012   Doxycycline  Nausea And Vomiting and Other (See Comments) 09/16/2021    Past Medical History:  Diagnosis Date   Abnormal chest CT 03/2012   a. 2013: Scattered patchy ground glass opacities and pulmonary nodules. Transbronchial biopsy with no definite etiology of lung finding- possible organizing pneumonia. b. F/u CT 05/2014: stable multiple tiny pulm nodules, no further w/u recommended per notes.    Anxiety    Arthritis    CAD (coronary artery disease)    CHF (congestive heart failure) (HCC)    Chronic midline posterior neck pain    Chronic pain disorder    Complication of anesthesia     difficult to urinate after    Depression    Elevated LFTs    Esophageal stricture    Family history of adverse reaction to anesthesia    mom had PONV   Fracture    back   GERD (gastroesophageal reflux disease)    History of kidney stones    Hypercholesteremia    Hypertension    Iron deficiency    Migraine headache    none lately; I've had alot in my life (06/21/2015)   NSTEMI (non-ST elevated myocardial infarction) (HCC) 07/2012   a. Normal cath 03/2012. b. 07/2012: troponin 4, normal cors, ?vasospasm. Did not tolerate Imdur  due to headache. On amlodipine .   Opacities of both lungs present on chest x-ray    Testosterone  deficiency    Tobacco abuse     Past Surgical History:  Procedure Laterality Date   ANKLE FUSION Left 2006   CERVICAL FUSION  X9   COLONOSCOPY     CYSTECTOMY     FRACTURE SURGERY     pt denies   LEFT HEART CATHETERIZATION WITH CORONARY ANGIOGRAM N/A 03/25/2012   Procedure: LEFT HEART CATHETERIZATION WITH CORONARY ANGIOGRAM;  Surgeon: Lonni JONETTA Cash, MD;  Location: [facility] CATH LAB;  Service: Cardiovascular;  Laterality: N/A;   LEFT HEART CATHETERIZATION WITH CORONARY ANGIOGRAM N/A 08/11/2012   Procedure: LEFT HEART CATHETERIZATION WITH CORONARY ANGIOGRAM;  Surgeon: Peter M Jordan, MD;  Location: [facility] CATH LAB;   Service: Cardiovascular;  Laterality: N/A;   NECK SURGERY  10/2023   POSTERIOR CERVICAL FUSION/FORAMINOTOMY  05/09/2011   Procedure: POSTERIOR CERVICAL FUSION/FORAMINOTOMY LEVEL 1;  Surgeon: Lamar LELON Peaches;  Location: [facility] NEURO ORS;  Service: Neurosurgery;  Laterality: N/A;  C4/5 posterior arthrodesis with instrumentation    POSTERIOR CERVICAL FUSION/FORAMINOTOMY N/A 12/06/2020   Procedure: Cervical two Ganglionectomy with extension of fusion from Cervical one to Cervical five;  Surgeon: Cheryle Debby LABOR, MD;  Location: [facility] OR;  Service: Neurosurgery;  Laterality: N/A;   POSTERIOR CERVICAL FUSION/FORAMINOTOMY N/A 12/06/2022   Procedure: Revision of Cervical One-Two Posterior Cervical Fusion;  Surgeon: Cheryle Debby LABOR, MD;  Location: [facility] OR;  Service: Neurosurgery;  Laterality: N/A;  RM 20 to follow   UPPER GASTROINTESTINAL ENDOSCOPY     VIDEO BRONCHOSCOPY  05/06/2012   Procedure: VIDEO BRONCHOSCOPY WITH FLUORO;  Surgeon: Lonell SHAUNNA Read,  MD;  Location: [facility] ENDOSCOPY;  Service: Cardiopulmonary;  Laterality: Bilateral;    Review of Systems:    All systems reviewed and negative except where noted in HPI.    Physical Exam:  There were no vitals taken for this visit. No LMP for male patient.  General: Well-nourished, well-developed in no acute distress.  Lungs: Clear to auscultation bilaterally. Non-labored. Heart: Regular rate and rhythm, no murmurs rubs or gallops.  Abdomen: Bowel sounds are normal; Abdomen is Soft; No hepatosplenomegaly, masses or hernias;  No Abdominal Tenderness; No guarding or rebound tenderness. Neuro: Alert and oriented x 3.  Grossly intact.  Psych: Alert and cooperative, normal mood and affect.   Imaging Studies: No results found.  Labs: CBC    Component Value Date/Time   WBC 8.8 10/26/2023 1600   RBC 4.46 10/26/2023 1600   HGB 13.4 10/26/2023 1600   HGB 15.0 09/18/2015 0845   HCT 39.5 10/26/2023 1600   HCT 44.3 09/18/2015 0845   PLT 237 10/26/2023  1600   PLT 235 09/18/2015 0845   MCV 88.6 10/26/2023 1600   MCV 93 09/18/2015 0845   MCH 30.0 10/26/2023 1600   MCHC 33.9 10/26/2023 1600   RDW 12.8 10/26/2023 1600   RDW 14.1 09/18/2015 0845   LYMPHSABS 0.7 10/26/2023 1600   LYMPHSABS 1.3 09/18/2015 0845   MONOABS 0.8 10/26/2023 1600   EOSABS 0.0 10/26/2023 1600   EOSABS 0.2 09/18/2015 0845   BASOSABS 0.0 10/26/2023 1600   BASOSABS 0.0 09/18/2015 0845    CMP     Component Value Date/Time   NA 137 10/26/2023 1600   NA 142 09/18/2015 0845   K 4.7 10/26/2023 1600   CL 96 (L) 10/26/2023 1600   CO2 29 10/26/2023 1600   GLUCOSE 96 10/26/2023 1600   BUN 23 (H) 10/26/2023 1600   BUN 11 09/18/2015 0845   CREATININE 1.25 (H) 10/26/2023 1600   CREATININE 1.16 10/01/2021 0939   CALCIUM  10.2 10/26/2023 1600   PROT 6.6 10/26/2023 1600   PROT 6.3 09/18/2015 0845   ALBUMIN  4.4 10/26/2023 1600   ALBUMIN  4.2 09/18/2015 0845   AST 34 10/26/2023 1600   ALT 81 (H) 10/26/2023 1600   ALKPHOS 214 (H) 10/26/2023 1600   BILITOT 0.6 10/26/2023 1600   BILITOT 0.4 09/18/2015 0845   GFRNONAA >60 10/26/2023 1600   GFRAA >60 06/23/2019 1742       Assessment and Plan:   Richard Franco is a 58 y.o. y/o male ***  Assessment and Plan Assessment & Plan       Richard Console, PA-C  Follow up ***

## 2024-04-22 ENCOUNTER — Ambulatory Visit: Admitting: Physician Assistant

## 2024-05-03 ENCOUNTER — Encounter (HOSPITAL_BASED_OUTPATIENT_CLINIC_OR_DEPARTMENT_OTHER)

## 2024-05-12 NOTE — Progress Notes (Deleted)
 Established Patient Pulmonology Office Visit   Subjective:  Patient ID: Richard Franco, male    DOB: [birth year]  MRN: [id number]  CC: No chief complaint on file.   HPI  Richard Franco is a 58 y.o. male with a PMH significant for pericarditis (unknown etiology), abnormal chest imaging (GGOs in upper lobes, thought to previously be COP, steroid responsive), cervical spine spinal epidural abscess now s/p removal of posterior instrumentation bilateral cervical, irrigation and debridement who presents for evaluation and management of abnormal chest imaging.   Last seen 02/16/2024, at the time recommended HRCT to reevaluate pulmonary infiltrates and GGOs previously evidence of CT neck.   Background: Underwent his cervical spine surgery on 10/24/2023 and then presented to ED on 10/26/23 with malaise, nausea, vomiting and inability to tolerate PO intake. At the time underwent cT cervical spine that showed bilateral upper lobe GGOs as well as mild emphysematous changes. The patient was treated with IV antimicrobials including vancomcyin, ceftraixone and then discharged on Augmentin  and flagyl .   The patients note that he had ground glass previously with Dr. Tonna. This happened since 2013 and he was treated with steroids for 6 months for a prolonged taper. He also underwent bronchoscopy with results documented below:   05/06/12 Bronchoscopy with BAL, cell count and TBBx of the lingula  BAL cell count with elevated macrophages (possible COP, Hypersensitivity pneumonitis vs chronic microaspiration, possible Aluminum exposure)  RVP neg Viral culture neg  AFB smear neg, culture negative   Cytology/pathology benign with no evidence of aspiration or ILD    {PULM QUESTIONNAIRES (Optional):33196}  ROS  {History (Optional):23778}  Current Outpatient Medications:    albuterol  (VENTOLIN  HFA) 108 (90 Base) MCG/ACT inhaler, Inhale 2 puffs into the lungs every 6 (six) hours as needed for wheezing or  shortness of breath., Disp: 8.5 g, Rfl: 2   amoxicillin -clavulanate (AUGMENTIN ) 875-125 MG tablet, Take 1 tablet by mouth every 12 (twelve) hours. (Patient not taking: Reported on 02/16/2024), Disp: 14 tablet, Rfl: 0   aspirin  81 MG EC tablet, Take 1 tablet (81 mg total) by mouth daily. Restart on 12/13/20, Disp: 30 tablet, Rfl: 12   cyclobenzaprine  (FLEXERIL ) 10 MG tablet, Take 1 tablet (10 mg total) by mouth 3 (three) times daily as needed for muscle spasms. (Patient not taking: Reported on 02/16/2024), Disp: 30 tablet, Rfl: 0   EPINEPHrine  0.3 mg/0.3 mL IJ SOAJ injection, Inject 0.3 mg into the muscle once as needed for anaphylaxis., Disp: , Rfl:    esomeprazole  (NEXIUM ) 40 MG capsule, Take 1 capsule (40 mg total) by mouth 2 (two) times daily before a meal., Disp: 60 capsule, Rfl: 0   FLUoxetine  (PROZAC ) 40 MG capsule, Take 40 mg by mouth at bedtime., Disp: , Rfl:    lamoTRIgine  (LAMICTAL ) 200 MG tablet, Take 400 mg by mouth at bedtime., Disp: , Rfl:    nitroGLYCERIN  (NITROSTAT ) 0.4 MG SL tablet, Place 1 tablet (0.4 mg total) under the tongue every 5 (five) minutes as needed for chest pain., Disp: 30 tablet, Rfl: 0   ondansetron  (ZOFRAN -ODT) 4 MG disintegrating tablet, Take 1 tablet (4 mg total) by mouth every 8 (eight) hours as needed., Disp: 20 tablet, Rfl: 0   oxyCODONE -acetaminophen  (PERCOCET) 10-325 MG tablet, Take 1 tablet by mouth every 4 (four) hours as needed for pain., Disp: 40 tablet, Rfl: 0   promethazine  (PHENERGAN ) 25 MG suppository, Place 1 suppository (25 mg total) rectally every 6 (six) hours as needed for nausea or vomiting., Disp: 12 each,  Rfl: 0   QUEtiapine  (SEROQUEL ) 50 MG tablet, Take 50 mg by mouth at bedtime as needed (for sleep)., Disp: , Rfl:    triamcinolone  cream (KENALOG ) 0.1 %, Apply 1 Application topically 2 (two) times daily. Do not apply to face, body folds or genital area. Do not apply for > 14 days (Patient not taking: Reported on 02/16/2024), Disp: 75 g, Rfl: 0    umeclidinium-vilanterol (ANORO ELLIPTA ) 62.5-25 MCG/ACT AEPB, Inhale 1 puff into the lungs daily., Disp: 1 each, Rfl: 6      Objective:  There were no vitals taken for this visit. {Pulm Vitals (Optional):32837}  Physical Exam   Diagnostic Review:  {Labs (Optional):32838}  Blood cultures on 10/26/23 --> NGTD   I reviewed CT soft tissue neck with contrast completed on 10/26/2023 which shows mild emphysematous changes as well as ground glass opacities in upper lobes. CXR completed the same day shows a RLL infiltrate versus band of atelectasis.     PFTs 2019 normal   Labs 2013: IgG subclasses negative A1AT 125  Immunoglobulin panel normal RF negative SPEP/UPEP without M spike ACE level normal IgE 1.6 ANCA panel negative ANA negative CK level normal  CT high res 02/2024:  IMPRESSION: 1. No evidence of interstitial lung disease. 2. Mild lobular air trapping on expiratory phase imaging, consistent with small airways disease. 3. Minimal paraseptal emphysema. 4. Multiple unchanged tiny pulmonary nodules, benign, requiring no further follow-up or characterization.    Assessment & Plan:   Assessment & Plan   No orders of the defined types were placed in this encounter.     No follow-ups on file.   Sameera Betton, MD

## 2024-05-13 ENCOUNTER — Ambulatory Visit (HOSPITAL_BASED_OUTPATIENT_CLINIC_OR_DEPARTMENT_OTHER): Admitting: Pulmonary Disease

## 2024-05-17 ENCOUNTER — Ambulatory Visit (INDEPENDENT_AMBULATORY_CARE_PROVIDER_SITE_OTHER)

## 2024-05-17 ENCOUNTER — Encounter (HOSPITAL_COMMUNITY): Payer: Self-pay | Admitting: *Deleted

## 2024-05-17 ENCOUNTER — Ambulatory Visit (HOSPITAL_COMMUNITY)
Admission: EM | Admit: 2024-05-17 | Discharge: 2024-05-17 | Disposition: A | Discharge status: Home / Self Care | Attending: Emergency Medicine | Admitting: Emergency Medicine

## 2024-05-17 DIAGNOSIS — R051 Acute cough: Secondary | ICD-10-CM

## 2024-05-17 DIAGNOSIS — J019 Acute sinusitis, unspecified: Secondary | ICD-10-CM | POA: Diagnosis not present

## 2024-05-17 DIAGNOSIS — B9689 Other specified bacterial agents as the cause of diseases classified elsewhere: Secondary | ICD-10-CM | POA: Diagnosis not present

## 2024-05-17 MED ORDER — IPRATROPIUM-ALBUTEROL 0.5-2.5 (3) MG/3ML IN SOLN
RESPIRATORY_TRACT | Status: AC
  Filled 2024-05-17: qty 3

## 2024-05-17 MED ORDER — AMOXICILLIN-POT CLAVULANATE 875-125 MG PO TABS: 1.0000 | ORAL_TABLET | Freq: Two times a day (BID) | ORAL | 0 refills | Status: AC

## 2024-05-17 MED ORDER — PROMETHAZINE-DM 6.25-15 MG/5ML PO SYRP: 5.0000 mL | ORAL_SOLUTION | Freq: Four times a day (QID) | ORAL | 0 refills | Status: AC | PRN

## 2024-05-17 MED ORDER — IPRATROPIUM-ALBUTEROL 0.5-2.5 (3) MG/3ML IN SOLN
3.0000 mL | Freq: Once | RESPIRATORY_TRACT | Status: AC
  Administered 2024-05-17: 3 mL via RESPIRATORY_TRACT

## 2024-05-17 MED ORDER — PREDNISONE 20 MG PO TABS: 40.0000 mg | ORAL_TABLET | Freq: Every day | ORAL | 0 refills | Status: AC

## 2024-05-17 NOTE — Discharge Instructions (Signed)
 Your chest xray looks good. Since you are now 7 days into symptoms without improvement, I am treating you with an antibiotic Please take Augmentin  as prescribed. Take with food to avoid upset stomach. Finish the full course - you should not have any leftover!  Continue your daily Ellipta inhaler, and use albuterol  2-3 times daily  Prednisone  40 mg daily for the next 5 days  The promethazine  DM cough syrup can be used up to 4 times daily. If this medication makes you drowsy, take only once before bed.  Please follow up with your lung specialist if symptoms are not improving

## 2024-05-17 NOTE — ED Provider Notes (Signed)
 [facility]    CSN: [id number] Arrival date & time: 05/17/24  9161      History   Chief Complaint Chief Complaint  Patient presents with   Cough   Sore Throat   Diarrhea    HPI Richard Franco is a 58 y.o. male.  Here with 7 day history of cough, chest tightness, rattle in the chest, nasal congestion. Deep breath feels restricted. Symptoms worse at night when laying flat. Also sore throat and diarrhea for 4 days No fever or chills  Trying albuterol  inhaler, imodium, OTC cough Daily ellipta  History of pulmonary nodule, tobacco use, CHF Followed by pulmonology for mild emphysema   Past Medical History:  Diagnosis Date   Abnormal chest CT 03/2012   a. 2013: Scattered patchy ground glass opacities and pulmonary nodules. Transbronchial biopsy with no definite etiology of lung finding- possible organizing pneumonia. b. F/u CT 05/2014: stable multiple tiny pulm nodules, no further w/u recommended per notes.    Anxiety    Arthritis    CAD (coronary artery disease)    CHF (congestive heart failure) (HCC)    Chronic midline posterior neck pain    Chronic pain disorder    Complication of anesthesia     difficult to urinate after    Depression    Elevated LFTs    Esophageal stricture    Family history of adverse reaction to anesthesia    mom had PONV   Fracture    back   GERD (gastroesophageal reflux disease)    History of kidney stones    Hypercholesteremia    Hypertension    Iron deficiency    Migraine headache    none lately; I've had alot in my life (06/21/2015)   NSTEMI (non-ST elevated myocardial infarction) (HCC) 07/2012   a. Normal cath 03/2012. b. 07/2012: troponin 4, normal cors, ?vasospasm. Did not tolerate Imdur  due to headache. On amlodipine .   Opacities of both lungs present on chest x-ray    Testosterone  deficiency    Tobacco abuse     Patient Active Problem List   Diagnosis Date Noted   Pseudoarthrosis of cervical spine (HCC)  12/06/2022   Intractable nausea and vomiting 09/16/2021   Hypokalemia 09/16/2021   Elevated LFTs 09/16/2021   Bicytopenia 09/16/2021   Post-operative complication 12/09/2020   Occipital neuralgia 12/06/2020   Throat swelling 07/06/2016   Cocaine use 07/06/2016   ETOH abuse 07/06/2016   Dyspnea on exertion 07/04/2016   Chest pain 07/04/2016   Essential hypertension 07/04/2016   AKI (acute kidney injury) 07/04/2016   Chest pain, atypical 11/16/2015   Pleuritic chest pain 06/22/2015   Tobacco abuse 06/22/2015   History of coronary vasospasm 06/22/2015   Smoking history 07/19/2014   Chronic fatigue 07/19/2014   Chronic arthralgias of knees and hips 11/26/2012   Hyperlipidemia 09/25/2012   GERD (gastroesophageal reflux disease) 08/11/2012   BPH (benign prostatic hyperplasia) 08/11/2012   Abnormal immunological finding in serum 05/03/2012   Pulmonary infiltrates 04/16/2012   Pulmonary nodule 03/25/2012   Abnormal CT lung screening 03/25/2012   History of tobacco abuse 02/18/2012   Chronic pain 02/18/2012   Hx of migraine headaches 02/18/2012   DYSPNEA 08/24/2009   CHEST PAIN-UNSPECIFIED 08/24/2009   ABFND, FALSE POSITIVE SEROLOGIC TEST, SYPH 01/12/2007   OSTEOARTHRITIS 01/05/2007    Past Surgical History:  Procedure Laterality Date   ANKLE FUSION Left 2006   CERVICAL FUSION  X9   COLONOSCOPY     CYSTECTOMY  FRACTURE SURGERY     pt denies   LEFT HEART CATHETERIZATION WITH CORONARY ANGIOGRAM N/A 03/25/2012   Procedure: LEFT HEART CATHETERIZATION WITH CORONARY ANGIOGRAM;  Surgeon: Lonni JONETTA Cash, MD;  Location: [facility] CATH LAB;  Service: Cardiovascular;  Laterality: N/A;   LEFT HEART CATHETERIZATION WITH CORONARY ANGIOGRAM N/A 08/11/2012   Procedure: LEFT HEART CATHETERIZATION WITH CORONARY ANGIOGRAM;  Surgeon: Peter M Jordan, MD;  Location: [facility] CATH LAB;  Service: Cardiovascular;  Laterality: N/A;   NECK SURGERY  10/2023   POSTERIOR CERVICAL FUSION/FORAMINOTOMY   05/09/2011   Procedure: POSTERIOR CERVICAL FUSION/FORAMINOTOMY LEVEL 1;  Surgeon: Lamar LELON Peaches;  Location: [facility];  Service: Neurosurgery;  Laterality: N/A;  C4/5 posterior arthrodesis with instrumentation    POSTERIOR CERVICAL FUSION/FORAMINOTOMY N/A 12/06/2020   Procedure: Cervical two Ganglionectomy with extension of fusion from Cervical one to Cervical five;  Surgeon: Cheryle Debby LABOR, MD;  Location: [facility] OR;  Service: Neurosurgery;  Laterality: N/A;   POSTERIOR CERVICAL FUSION/FORAMINOTOMY N/A 12/06/2022   Procedure: Revision of Cervical One-Two Posterior Cervical Fusion;  Surgeon: Cheryle Debby LABOR, MD;  Location: [facility] OR;  Service: Neurosurgery;  Laterality: N/A;  RM 20 to follow   UPPER GASTROINTESTINAL ENDOSCOPY     VIDEO BRONCHOSCOPY  05/06/2012   Procedure: VIDEO BRONCHOSCOPY WITH FLUORO;  Surgeon: Lonell SHAUNNA Read, MD;  Location: [facility] ENDOSCOPY;  Service: Cardiopulmonary;  Laterality: Bilateral;       Home Medications    Prior to Admission medications   Medication Sig Start Date End Date Taking? Authorizing Provider  albuterol  (VENTOLIN  HFA) 108 (90 Base) MCG/ACT inhaler Inhale 2 puffs into the lungs every 6 (six) hours as needed for wheezing or shortness of breath. 03/01/24  Yes Alghanim, Fahid, MD  amoxicillin -clavulanate (AUGMENTIN ) 875-125 MG tablet Take 1 tablet by mouth every 12 (twelve) hours for 7 days. 05/17/24 05/24/24 Yes Weylin Plagge, Asberry RIGGERS  aspirin  81 MG EC tablet Take 1 tablet (81 mg total) by mouth daily. Restart on 12/13/20 12/08/20  Yes Cheryle Debby LABOR, MD  esomeprazole  (NEXIUM ) 40 MG capsule Take 1 capsule (40 mg total) by mouth 2 (two) times daily before a meal. 06/13/23  Yes Lemmon, Delon Gibson, PA  FLUoxetine  (PROZAC ) 40 MG capsule Take 40 mg by mouth at bedtime.   Yes [provider]  lamoTRIgine  (LAMICTAL ) 200 MG tablet Take 400 mg by mouth at bedtime.   Yes [provider]  predniSONE  (DELTASONE ) 20 MG tablet Take 2 tablets (40  mg total) by mouth daily with breakfast for 5 days. 05/17/24 05/22/24 Yes Princella Jaskiewicz, Asberry, PA-C  promethazine -dextromethorphan (PROMETHAZINE -DM) 6.25-15 MG/5ML syrup Take 5 mLs by mouth 4 (four) times daily as needed for cough. 05/17/24  Yes Zhi Geier, Asberry, PA-C  QUEtiapine  (SEROQUEL ) 50 MG tablet Take 50 mg by mouth at bedtime as needed (for sleep). 04/16/20  Yes [provider]  umeclidinium-vilanterol (ANORO ELLIPTA ) 62.5-25 MCG/ACT AEPB Inhale 1 puff into the lungs daily. 03/01/24  Yes Alghanim, Fahid, MD  EPINEPHrine  0.3 mg/0.3 mL IJ SOAJ injection Inject 0.3 mg into the muscle once as needed for anaphylaxis. 07/14/19   [provider]  nitroGLYCERIN  (NITROSTAT ) 0.4 MG SL tablet Place 1 tablet (0.4 mg total) under the tongue every 5 (five) minutes as needed for chest pain. 01/16/22   Robinson, John K, PA-C  ondansetron  (ZOFRAN -ODT) 4 MG disintegrating tablet Take 1 tablet (4 mg total) by mouth every 8 (eight) hours as needed. 12/22/22   Curatolo, Adam, DO  promethazine  (PHENERGAN ) 25 MG suppository Place 1 suppository (  25 mg total) rectally every 6 (six) hours as needed for nausea or vomiting. 10/26/23   Small, Lyle CROME, PA    Family History Family History  Problem Relation Age of Onset   Diabetes Mother    Cancer Mother        ?   Hypertension Mother    Hyperlipidemia Father    Diabetes Father    Coronary artery disease Paternal Uncle    Asthma Sister    Diabetes Brother    Hypertension Brother    Colon cancer Neg Hx    Colon polyps Neg Hx    Rectal cancer Neg Hx    Stomach cancer Neg Hx     Social History Social History   Tobacco Use   Smoking status: Former    Current packs/day: 0.00    Average packs/day: 1 pack/day for 30.0 years (30.0 ttl pk-yrs)    Types: Cigarettes    Start date: 12/17/1987    Quit date: 12/16/2017    Years since quitting: 6.4   Smokeless tobacco: Former    Types: Chew    Quit date: 2014  Vaping Use   Vaping status: Never Used   Substance Use Topics   Alcohol  use: Not Currently   Drug use: No     Allergies   Midodrine hcl, Other, Sulfa antibiotics, and Doxycycline    Review of Systems Review of Systems As per HPI  Physical Exam Triage Vital Signs ED Triage Vitals  Encounter Vitals Group     BP 05/17/24 0954 136/70     Girls Systolic BP Percentile --      Girls Diastolic BP Percentile --      Boys Systolic BP Percentile --      Boys Diastolic BP Percentile --      Pulse Rate 05/17/24 0954 97     Resp 05/17/24 0954 18     Temp 05/17/24 0954 98.3 F (36.8 C)     Temp Source 05/17/24 0954 Oral     SpO2 05/17/24 0954 97 %     Weight --      Height --      Head Circumference --      Peak Flow --      Pain Score 05/17/24 0951 0     Pain Loc --      Pain Education --      Exclude from Growth Chart --    No data found.  Updated Vital Signs BP 136/70 (BP Location: Left Arm)   Pulse 97   Temp 98.3 F (36.8 C) (Oral)   Resp 18   SpO2 97%    Physical Exam Vitals and nursing note reviewed.  Constitutional:      General: He is not in acute distress.    Appearance: He is not ill-appearing.  HENT:     Right Ear: Tympanic membrane and ear canal normal.     Left Ear: Tympanic membrane and ear canal normal.     Nose: Congestion present. No rhinorrhea.     Mouth/Throat:     Mouth: Mucous membranes are moist.     Pharynx: Oropharynx is clear. No posterior oropharyngeal erythema.  Eyes:     Conjunctiva/sclera: Conjunctivae normal.  Cardiovascular:     Rate and Rhythm: Normal rate and regular rhythm.     Pulses: Normal pulses.     Heart sounds: Normal heart sounds.  Pulmonary:     Effort: Pulmonary effort is normal.     Breath sounds: Rales present.  Comments: Low pitched crackles right lower lobe Abdominal:     Palpations: Abdomen is soft.     Tenderness: There is no abdominal tenderness.  Musculoskeletal:     Cervical back: Normal range of motion.  Lymphadenopathy:     Cervical: No  cervical adenopathy.  Skin:    General: Skin is warm and dry.  Neurological:     Mental Status: He is alert and oriented to person, place, and time.     UC Treatments / Results  Labs (all labs ordered are listed, but only abnormal results are displayed) Labs Reviewed - No data to display  EKG  Radiology DG Chest 2 View Result Date: 05/17/2024 CLINICAL DATA:  Cough and shortness of breath 7 days. EXAM: CHEST - 2 VIEW COMPARISON:  None Available. FINDINGS: The heart size and mediastinal contours are within normal limits. Resolution of right basilar opacity since prior study. Both lungs are clear. Cervical spine fusion hardware again noted. IMPRESSION: No active cardiopulmonary disease. Electronically Signed   By: Norleen DELENA Kil M.D.   On: 05/17/2024 10:42    Procedures Procedures   Medications Ordered in UC Medications  ipratropium-albuterol  (DUONEB) 0.5-2.5 (3) MG/3ML nebulizer solution 3 mL (3 mLs Nebulization Given 05/17/24 1020)    Initial Impression / Assessment and Plan / UC Course  I have reviewed the triage vital signs and the nursing notes.  Pertinent labs & imaging results that were available during my care of the patient were reviewed by me and considered in my medical decision making (see chart for details).  Afebrile, sating 97% room air Duoneb given in clinic. Chest xray negative. Images independently reviewed by me, agree with radiology interpretation. With duration of symptoms treat with antibiotic for sinobronchial etiology. Augmentin  BID x 7 days. Prednisone  burst. Promethazine  DM with drowsy precautions. Return and ED precautions, pulmonology follow up.    Final Clinical Impressions(s) / UC Diagnoses   Final diagnoses:  Acute cough  Acute bacterial sinusitis     Discharge Instructions      Your chest xray looks good. Since you are now 7 days into symptoms without improvement, I am treating you with an antibiotic Please take Augmentin  as prescribed.  Take with food to avoid upset stomach. Finish the full course - you should not have any leftover!  Continue your daily Ellipta inhaler, and use albuterol  2-3 times daily  Prednisone  40 mg daily for the next 5 days  The promethazine  DM cough syrup can be used up to 4 times daily. If this medication makes you drowsy, take only once before bed.  Please follow up with your lung specialist if symptoms are not improving      ED Prescriptions     Medication Sig Dispense Auth. Provider   amoxicillin -clavulanate (AUGMENTIN ) 875-125 MG tablet Take 1 tablet by mouth every 12 (twelve) hours for 7 days. 14 tablet Zahki Hoogendoorn, PA-C   promethazine -dextromethorphan (PROMETHAZINE -DM) 6.25-15 MG/5ML syrup Take 5 mLs by mouth 4 (four) times daily as needed for cough. 240 mL Rayel Santizo, PA-C   predniSONE  (DELTASONE ) 20 MG tablet Take 2 tablets (40 mg total) by mouth daily with breakfast for 5 days. 10 tablet Shizuko Wojdyla, Asberry, PA-C      PDMP not reviewed this encounter.   Knolan Simien, Asberry, PA-C 05/17/24 1059

## 2024-05-17 NOTE — ED Triage Notes (Signed)
 Pt states that he has a hx of a lot of lung issues and has had cough, chest tightness, and sore throat X 7 days. States when he takes a deep breath he feels his chest rattle.He states feels worse at night. He has been taking a lot of different OTC meds he is using his albuterol  MDI last 4-5 days. He also states he has diarrhea 4-5 days he is taking imodium for this.

## 2024-05-28 ENCOUNTER — Ambulatory Visit (HOSPITAL_BASED_OUTPATIENT_CLINIC_OR_DEPARTMENT_OTHER): Admitting: Pulmonary Disease

## 2024-06-14 ENCOUNTER — Other Ambulatory Visit (HOSPITAL_BASED_OUTPATIENT_CLINIC_OR_DEPARTMENT_OTHER): Payer: Self-pay | Admitting: Pulmonary Disease

## 2024-06-14 DIAGNOSIS — J432 Centrilobular emphysema: Secondary | ICD-10-CM

## 2024-06-21 ENCOUNTER — Encounter (HOSPITAL_BASED_OUTPATIENT_CLINIC_OR_DEPARTMENT_OTHER)

## 2024-06-21 ENCOUNTER — Ambulatory Visit (HOSPITAL_BASED_OUTPATIENT_CLINIC_OR_DEPARTMENT_OTHER): Admitting: Pulmonary Disease

## 2024-06-21 ENCOUNTER — Encounter (HOSPITAL_BASED_OUTPATIENT_CLINIC_OR_DEPARTMENT_OTHER): Payer: Self-pay

## 2024-07-09 ENCOUNTER — Emergency Department (HOSPITAL_BASED_OUTPATIENT_CLINIC_OR_DEPARTMENT_OTHER)

## 2024-07-09 ENCOUNTER — Other Ambulatory Visit: Payer: Self-pay

## 2024-07-09 ENCOUNTER — Emergency Department (HOSPITAL_BASED_OUTPATIENT_CLINIC_OR_DEPARTMENT_OTHER)
Admission: EM | Admit: 2024-07-09 | Discharge: 2024-07-09 | Disposition: A | Discharge status: Home / Self Care | Attending: Emergency Medicine | Admitting: Emergency Medicine

## 2024-07-09 ENCOUNTER — Encounter (HOSPITAL_BASED_OUTPATIENT_CLINIC_OR_DEPARTMENT_OTHER): Payer: Self-pay | Admitting: Emergency Medicine

## 2024-07-09 DIAGNOSIS — Z7982 Long term (current) use of aspirin: Secondary | ICD-10-CM | POA: Diagnosis not present

## 2024-07-09 DIAGNOSIS — R079 Chest pain, unspecified: Secondary | ICD-10-CM | POA: Diagnosis present

## 2024-07-09 DIAGNOSIS — I10 Essential (primary) hypertension: Secondary | ICD-10-CM | POA: Diagnosis not present

## 2024-07-09 LAB — BASIC METABOLIC PANEL WITH GFR
Anion gap: 13 (ref 5–15)
BUN: 11 mg/dL (ref 6–20)
CO2: 25 mmol/L (ref 22–32)
Calcium: 9.6 mg/dL (ref 8.9–10.3)
Chloride: 100 mmol/L (ref 98–111)
Creatinine, Ser: 1.22 mg/dL (ref 0.61–1.24)
GFR, Estimated: >60 mL/min
Glucose, Bld: 115 mg/dL — ABNORMAL HIGH (ref 70–99)
Potassium: 4.2 mmol/L (ref 3.5–5.1)
Sodium: 138 mmol/L (ref 135–145)

## 2024-07-09 LAB — CBC
HCT: 43.3 % (ref 39.0–52.0)
Hemoglobin: 14.7 g/dL (ref 13.0–17.0)
MCH: 29.3 pg (ref 26.0–34.0)
MCHC: 33.9 g/dL (ref 30.0–36.0)
MCV: 86.4 fL (ref 80.0–100.0)
Platelets: 248 K/uL (ref 150–400)
RBC: 5.01 MIL/uL (ref 4.22–5.81)
RDW: 13.5 % (ref 11.5–15.5)
WBC: 6.7 K/uL (ref 4.0–10.5)
nRBC: 0 % (ref 0.0–0.2)

## 2024-07-09 LAB — TROPONIN T, HIGH SENSITIVITY
Troponin T High Sensitivity: <15 ng/L (ref 0–19)
Troponin T High Sensitivity: <15 ng/L (ref 0–19)

## 2024-07-09 LAB — D-DIMER, QUANTITATIVE: D-Dimer, Quant: 0.61 ug{FEU}/mL — ABNORMAL HIGH (ref 0.00–0.50)

## 2024-07-09 MED ORDER — ACETAMINOPHEN 500 MG PO TABS
1000.0000 mg | ORAL_TABLET | Freq: Once | ORAL | Status: AC
  Administered 2024-07-09: 1000 mg via ORAL
  Filled 2024-07-09: qty 2

## 2024-07-09 MED ORDER — IOHEXOL 350 MG/ML SOLN
75.0000 mL | Freq: Once | INTRAVENOUS | Status: AC | PRN
  Administered 2024-07-09: 75 mL via INTRAVENOUS

## 2024-07-09 NOTE — Discharge Instructions (Addendum)
 Evaluation for your chest pain was reassuring.  Please follow-up your PCP.  If your chest pain worsens or changes or any other concerning symptom please return to the ED for further evaluation.

## 2024-07-09 NOTE — ED Provider Notes (Signed)
 " [place] [facility] Provider Note   CSN: [id number] Arrival date & time: 07/09/24  1212     Patient presents with: Chest Pain  HPI Richard Franco is a 59 y.o. male with h/o NSTEMI 12 years ago, chronic pain, hypertension, multiple cervical spine surgeries presenting for chest pain that started 2 days ago.  Pain started centrally but radiates to the left and down the left arm.  He states he took 1 nitro yesterday and thought it felt better.  Reports that he took 3 nitro today and there was no relief.  Had intermittent shortness of breath yesterday but none today.  Denies calf tenderness or swelling.  Pain has improved overall but still present.    Chest Pain      Prior to Admission medications  Medication Sig Start Date End Date Taking? Authorizing Provider  Oxycodone  HCl 10 MG TABS Take 10 mg by mouth 4 (four) times daily as needed. 06/13/24  Yes [provider]  albuterol  (VENTOLIN  HFA) 108 (90 Base) MCG/ACT inhaler INHALE 2 PUFFS INTO THE LUNGS EVERY 6 HOURS AS NEEDED FOR WHEEZING OR SHORTNESS OF BREATH 06/15/24   Alghanim, Paula, MD  aspirin  81 MG EC tablet Take 1 tablet (81 mg total) by mouth daily. Restart on 12/13/20 12/08/20   Cheryle Debby LABOR, MD  EPINEPHrine  0.3 mg/0.3 mL IJ SOAJ injection Inject 0.3 mg into the muscle once as needed for anaphylaxis. 07/14/19   [provider]  esomeprazole  (NEXIUM ) 40 MG capsule Take 1 capsule (40 mg total) by mouth 2 (two) times daily before a meal. 06/13/23   Lemmon, Delon Gibson, PA  FLUoxetine  (PROZAC ) 40 MG capsule Take 40 mg by mouth at bedtime.    [provider]  lamoTRIgine  (LAMICTAL ) 200 MG tablet Take 400 mg by mouth at bedtime.    [provider]  nitroGLYCERIN  (NITROSTAT ) 0.4 MG SL tablet Place 1 tablet (0.4 mg total) under the tongue every 5 (five) minutes as needed for chest pain. 01/16/22   Daisi Kentner K, PA-C  ondansetron  (ZOFRAN -ODT) 4 MG  disintegrating tablet Take 1 tablet (4 mg total) by mouth every 8 (eight) hours as needed. 12/22/22   Curatolo, Adam, DO  promethazine  (PHENERGAN ) 25 MG suppository Place 1 suppository (25 mg total) rectally every 6 (six) hours as needed for nausea or vomiting. 10/26/23   Small, Brooke L, PA  promethazine -dextromethorphan (PROMETHAZINE -DM) 6.25-15 MG/5ML syrup Take 5 mLs by mouth 4 (four) times daily as needed for cough. 05/17/24   Rising, Asberry, PA-C  QUEtiapine  (SEROQUEL ) 50 MG tablet Take 50 mg by mouth at bedtime as needed (for sleep). 04/16/20   [provider]  umeclidinium-vilanterol (ANORO ELLIPTA ) 62.5-25 MCG/ACT AEPB Inhale 1 puff into the lungs daily. 03/01/24   Alghanim, Paula, MD    Allergies: Midodrine hcl, Other, Sulfa antibiotics, and Doxycycline     Review of Systems  Cardiovascular:  Positive for chest pain.    Updated Vital Signs BP 124/85 (BP Location: Left Arm)   Pulse 74   Temp 98.1 F (36.7 C) (Oral)   Resp 18   Ht 5' 8 (1.727 m)   Wt 79.4 kg   SpO2 99%   BMI 26.61 kg/m   Physical Exam Vitals and nursing note reviewed.  HENT:     Head: Normocephalic and atraumatic.     Mouth/Throat:     Mouth: Mucous membranes are moist.  Eyes:     General:  Right eye: No discharge.        Left eye: No discharge.     Conjunctiva/sclera: Conjunctivae normal.  Cardiovascular:     Rate and Rhythm: Normal rate and regular rhythm.     Pulses: Normal pulses.     Heart sounds: Normal heart sounds.  Pulmonary:     Effort: Pulmonary effort is normal.     Breath sounds: Normal breath sounds.  Abdominal:     General: Abdomen is flat.     Palpations: Abdomen is soft.  Skin:    General: Skin is warm and dry.  Neurological:     General: No focal deficit present.  Psychiatric:        Mood and Affect: Mood normal.     (all labs ordered are listed, but only abnormal results are displayed) Labs Reviewed  BASIC METABOLIC PANEL WITH GFR - Abnormal; Notable for  the following components:      Result Value   Glucose, Bld 115 (*)    All other components within normal limits  D-DIMER, QUANTITATIVE - Abnormal; Notable for the following components:   D-Dimer, Quant 0.61 (*)    All other components within normal limits  CBC  TROPONIN T, HIGH SENSITIVITY  TROPONIN T, HIGH SENSITIVITY    EKG: EKG Interpretation Date/Time:  Friday July 09 2024 12:21:48 EST Ventricular Rate:  99 PR Interval:  146 QRS Duration:  90 QT Interval:  348 QTC Calculation: 447 R Axis:   85  Text Interpretation: Sinus rhythm Right atrial enlargement Confirmed by Cottie Cough [phone]) on 07/09/2024 12:35:15 PM  Radiology: CT Angio Chest PE W/Cm &/Or Wo Cm Result Date: 07/09/2024 CLINICAL DATA:  Pulmonary embolism suspected, low to intermediate probability. Positive D-dimer. Chest pain. EXAM: CT ANGIOGRAPHY CHEST WITH CONTRAST TECHNIQUE: Multidetector CT imaging of the chest was performed using the standard protocol during bolus administration of intravenous contrast. Multiplanar CT image reconstructions and MIPs were obtained to evaluate the vascular anatomy. RADIATION DOSE REDUCTION: This exam was performed according to the departmental dose-optimization program which includes automated exposure control, adjustment of the mA and/or kV according to patient size and/or use of iterative reconstruction technique. CONTRAST:  75mL OMNIPAQUE  IOHEXOL  350 MG/ML SOLN COMPARISON:  Chest CT 02/25/2024 FINDINGS: Cardiovascular: Suboptimal evaluation for pulmonary embolism because contrast is predominantly in the left side of the heart and the aorta. There are no large filling defects in the main pulmonary arteries but cannot evaluate beyond the main pulmonary arteries. Normal appearance of the thoracic aorta. Arch great vessels are patent. Proximal abdominal aorta is patent. Visualized visceral arteries are patent. Heart size is normal. No significant pericardial effusion. Mediastinum/Nodes:  Esophagus is unremarkable. Mediastinal, hilar or axillary lymph node enlargement. Lungs/Pleura: Scattered small pulmonary nodules throughout both lungs that are not significantly changed since the previous examination. Elongated density in the left lower lobe superior segment on image 78, sequence 11, and not clear if this is chronic. This density measures 7 x 3 mm, mean diameter 5 mm. Chronic subpleural reticular densities in the anterior left upper lobe have not significantly changed. Dependent atelectasis in both lungs. No large areas of airspace disease or consolidation. Upper Abdomen: Question a small stone in the right kidney upper pole. Otherwise, images of the upper abdomen are unremarkable. Musculoskeletal: No acute bone abnormality. Surgical plate in the lower cervical spine. Review of the MIP images confirms the above findings. IMPRESSION: 1. Suboptimal evaluation for pulmonary embolism because contrast is predominantly in the left side of the heart and  the aorta. There are no large filling defects in the main pulmonary arteries but cannot evaluate beyond the main pulmonary arteries. 2. No acute chest abnormality. 3. Question a small stone in the right kidney upper pole. 4. Small scattered pulmonary nodules are not significantly changed from the previous examination. Question a new elongated density in the left lower lobe superior segment with a mean diameter of 5 mm. No follow-up needed if patient is low-risk. Non-contrast chest CT can be considered in 12 months if patient is high-risk. This recommendation follows the consensus statement: Guidelines for Management of Incidental Pulmonary Nodules Detected on CT Images: From the Fleischner Society 2017; Radiology 2017; 284:228-243. Electronically Signed   By: Juliene Balder M.D.   On: 07/09/2024 16:48   DG Chest 2 View Result Date: 07/09/2024 CLINICAL DATA:  Chest pain EXAM: CHEST - 2 VIEW COMPARISON:  May 17, 2024 FINDINGS: The heart size and  mediastinal contours are within normal limits. Both lungs are clear. The visualized skeletal structures are unremarkable. IMPRESSION: No active cardiopulmonary disease. Electronically Signed   By: Lynwood Landy Raddle M.D.   On: 07/09/2024 13:42     Procedures   Medications Ordered in the ED  acetaminophen  (TYLENOL ) tablet 1,000 mg (1,000 mg Oral Given 07/09/24 1405)  iohexol  (OMNIPAQUE ) 350 MG/ML injection 75 mL (75 mLs Intravenous Contrast Given 07/09/24 1606)                                    Medical Decision Making Amount and/or Complexity of Data Reviewed Labs: ordered. Radiology: ordered.  Risk OTC drugs. Prescription drug management.   Initial Impression and Ddx 59 yo well appearing male presenting for chest pain.  Exam unremarkable.  DDx includes ACS, PE, dissection, pneumothorax, other. Patient PMH that increases complexity of ED encounter:  h/o NSTEMI 12 years ago, chronic pain, hypertension, multiple cervical spine surgeries   Interpretation of Diagnostics - I independent reviewed and interpreted the labs as followed: D dimer 0.61, negative serial troponins  - I independently visualized the following imaging with scope of interpretation limited to determining acute life threatening conditions related to emergency care: CT angio chest, which revealed no acute chest abnormality.  Suboptimal for PE evaluation given contrast distribution.  Main pulmonary arteries appear to be negative for PE.  -I personally reviewed and interpreted EKG which revealed sinus rhythm  Patient Reassessment and Ultimate Disposition/Management Overall workup is reassuring.  Does not suggest ACS.  PE also unlikely given reassuring CT given contrast distribution.  However suspicion for PE is low given nonpleuritic chest pain only slightly elevated D-dimer, negative troponins, no signs of DVT and reassuring CT.  Suspect this could be muscular and/or chronic pain contributing.  Advised supportive care and  PCP follow-up.  Discussed return precautions.  Discharged.  Patient management required discussion with the following services or consulting groups:  None  Complexity of Problems Addressed Acute complicated illness or Injury  Additional Data Reviewed and Analyzed Further history obtained from: Past medical history and medications listed in the EMR and Prior ED visit notes  Patient Encounter Risk Assessment Consideration of hospitalization      Final diagnoses:  Chest pain, unspecified type    ED Discharge Orders     None          Lang Norleen POUR, PA-C 07/09/24 1711    Cottie Donnice PARAS, MD 07/10/24 [phone]  "

## 2024-07-09 NOTE — ED Triage Notes (Signed)
 Pt c/o chest pain x 1 day, took 1 nitroglycerin  with relief. Chest pain radiating to L arm returned appx 0700 today, took 3 nitroglycerin  today, last taken appx 45 min pta.
